# Patient Record
Sex: Male | Born: 1935 | Race: White | Hispanic: No | Marital: Married | Smoking: Former smoker
Health system: Southern US, Community
[De-identification: ages and names within clinical notes are randomized; demographics above are authoritative.]

## PROBLEM LIST (undated history)

## (undated) DIAGNOSIS — K5792 Diverticulitis of intestine, part unspecified, without perforation or abscess without bleeding: Secondary | ICD-10-CM

## (undated) DIAGNOSIS — N390 Urinary tract infection, site not specified: Secondary | ICD-10-CM

## (undated) DIAGNOSIS — E785 Hyperlipidemia, unspecified: Secondary | ICD-10-CM

## (undated) DIAGNOSIS — I10 Essential (primary) hypertension: Secondary | ICD-10-CM

## (undated) DIAGNOSIS — C3490 Malignant neoplasm of unspecified part of unspecified bronchus or lung: Secondary | ICD-10-CM

## (undated) DIAGNOSIS — F0390 Unspecified dementia without behavioral disturbance: Secondary | ICD-10-CM

## (undated) DIAGNOSIS — F329 Major depressive disorder, single episode, unspecified: Secondary | ICD-10-CM

## (undated) DIAGNOSIS — I639 Cerebral infarction, unspecified: Secondary | ICD-10-CM

## (undated) DIAGNOSIS — I4891 Unspecified atrial fibrillation: Secondary | ICD-10-CM

## (undated) DIAGNOSIS — C679 Malignant neoplasm of bladder, unspecified: Secondary | ICD-10-CM

## (undated) HISTORY — PX: CATARACT EXTRACTION, BILATERAL: SHX1313

## (undated) HISTORY — DX: Diverticulitis of intestine, part unspecified, without perforation or abscess without bleeding: K57.92

## (undated) HISTORY — DX: Unspecified atrial fibrillation: I48.91

## (undated) HISTORY — DX: Malignant neoplasm of unspecified part of unspecified bronchus or lung: C34.90

## (undated) HISTORY — DX: Hyperlipidemia, unspecified: E78.5

## (undated) HISTORY — DX: Malignant neoplasm of bladder, unspecified: C67.9

## (undated) HISTORY — PX: LUNG REMOVAL, PARTIAL: SHX233

---

## 1997-08-22 ENCOUNTER — Encounter: Admission: RE | Admit: 1997-08-22 | Discharge: 1997-08-22 | Payer: Self-pay | Admitting: Family Medicine

## 1997-12-18 ENCOUNTER — Encounter: Admission: RE | Admit: 1997-12-18 | Discharge: 1997-12-18 | Payer: Self-pay | Admitting: Family Medicine

## 1998-07-07 ENCOUNTER — Encounter: Admission: RE | Admit: 1998-07-07 | Discharge: 1998-07-07 | Payer: Self-pay | Admitting: Family Medicine

## 1998-08-18 ENCOUNTER — Encounter: Admission: RE | Admit: 1998-08-18 | Discharge: 1998-08-18 | Payer: Self-pay | Admitting: Family Medicine

## 1999-05-27 ENCOUNTER — Encounter: Admission: RE | Admit: 1999-05-27 | Discharge: 1999-05-27 | Payer: Self-pay | Admitting: Family Medicine

## 1999-08-20 ENCOUNTER — Encounter: Admission: RE | Admit: 1999-08-20 | Discharge: 1999-08-20 | Payer: Self-pay | Admitting: Family Medicine

## 2000-01-12 ENCOUNTER — Encounter: Admission: RE | Admit: 2000-01-12 | Discharge: 2000-01-12 | Payer: Self-pay | Admitting: Family Medicine

## 2000-02-03 ENCOUNTER — Encounter: Admission: RE | Admit: 2000-02-03 | Discharge: 2000-02-03 | Payer: Self-pay | Admitting: Family Medicine

## 2000-02-03 ENCOUNTER — Ambulatory Visit (HOSPITAL_COMMUNITY): Admission: RE | Admit: 2000-02-03 | Discharge: 2000-02-03 | Payer: Self-pay | Admitting: Family Medicine

## 2000-02-22 HISTORY — PX: CYSTECTOMY: SUR359

## 2000-03-08 ENCOUNTER — Ambulatory Visit (HOSPITAL_COMMUNITY): Admission: RE | Admit: 2000-03-08 | Discharge: 2000-03-08 | Payer: Self-pay | Admitting: *Deleted

## 2000-03-21 ENCOUNTER — Encounter: Admission: RE | Admit: 2000-03-21 | Discharge: 2000-03-21 | Payer: Self-pay | Admitting: Family Medicine

## 2000-03-24 ENCOUNTER — Encounter: Admission: RE | Admit: 2000-03-24 | Discharge: 2000-03-24 | Payer: Self-pay | Admitting: Family Medicine

## 2000-09-21 ENCOUNTER — Encounter: Admission: RE | Admit: 2000-09-21 | Discharge: 2000-09-21 | Payer: Self-pay | Admitting: *Deleted

## 2000-09-21 ENCOUNTER — Encounter: Payer: Self-pay | Admitting: *Deleted

## 2000-10-20 ENCOUNTER — Observation Stay (HOSPITAL_COMMUNITY): Admission: RE | Admit: 2000-10-20 | Discharge: 2000-10-21 | Payer: Self-pay | Admitting: Urology

## 2000-10-20 ENCOUNTER — Encounter (INDEPENDENT_AMBULATORY_CARE_PROVIDER_SITE_OTHER): Payer: Self-pay | Admitting: Specialist

## 2000-10-24 ENCOUNTER — Ambulatory Visit (HOSPITAL_COMMUNITY): Admission: RE | Admit: 2000-10-24 | Discharge: 2000-10-24 | Payer: Self-pay | Admitting: Urology

## 2000-10-24 ENCOUNTER — Encounter: Payer: Self-pay | Admitting: Urology

## 2000-11-03 ENCOUNTER — Ambulatory Visit (HOSPITAL_COMMUNITY): Admission: RE | Admit: 2000-11-03 | Discharge: 2000-11-03 | Payer: Self-pay | Admitting: Oncology

## 2000-11-03 ENCOUNTER — Encounter: Payer: Self-pay | Admitting: Oncology

## 2000-11-20 ENCOUNTER — Inpatient Hospital Stay (HOSPITAL_COMMUNITY): Admission: RE | Admit: 2000-11-20 | Discharge: 2000-11-27 | Payer: Self-pay | Admitting: Urology

## 2000-11-20 ENCOUNTER — Encounter (INDEPENDENT_AMBULATORY_CARE_PROVIDER_SITE_OTHER): Payer: Self-pay

## 2001-01-26 ENCOUNTER — Inpatient Hospital Stay (HOSPITAL_COMMUNITY): Admission: EM | Admit: 2001-01-26 | Discharge: 2001-01-29 | Payer: Self-pay | Admitting: *Deleted

## 2001-01-28 ENCOUNTER — Encounter: Payer: Self-pay | Admitting: Oncology

## 2001-03-16 ENCOUNTER — Ambulatory Visit: Admission: RE | Admit: 2001-03-16 | Discharge: 2001-06-14 | Payer: Self-pay | Admitting: *Deleted

## 2001-03-26 ENCOUNTER — Inpatient Hospital Stay (HOSPITAL_COMMUNITY): Admission: AD | Admit: 2001-03-26 | Discharge: 2001-03-29 | Payer: Self-pay | Admitting: Urology

## 2001-03-27 ENCOUNTER — Encounter: Payer: Self-pay | Admitting: Urology

## 2001-03-29 ENCOUNTER — Encounter: Payer: Self-pay | Admitting: Urology

## 2001-04-07 ENCOUNTER — Ambulatory Visit (HOSPITAL_COMMUNITY): Admission: RE | Admit: 2001-04-07 | Discharge: 2001-04-07 | Payer: Self-pay | Admitting: Urology

## 2001-04-07 ENCOUNTER — Encounter: Payer: Self-pay | Admitting: Urology

## 2001-04-10 ENCOUNTER — Encounter: Payer: Self-pay | Admitting: Urology

## 2001-04-10 ENCOUNTER — Ambulatory Visit (HOSPITAL_COMMUNITY): Admission: RE | Admit: 2001-04-10 | Discharge: 2001-04-10 | Payer: Self-pay | Admitting: Urology

## 2001-06-11 ENCOUNTER — Ambulatory Visit (HOSPITAL_COMMUNITY): Admission: RE | Admit: 2001-06-11 | Discharge: 2001-06-11 | Payer: Self-pay | Admitting: Oncology

## 2001-06-11 ENCOUNTER — Encounter: Payer: Self-pay | Admitting: Oncology

## 2001-06-12 ENCOUNTER — Ambulatory Visit (HOSPITAL_COMMUNITY): Admission: RE | Admit: 2001-06-12 | Discharge: 2001-06-12 | Payer: Self-pay | Admitting: Oncology

## 2001-06-12 ENCOUNTER — Encounter: Payer: Self-pay | Admitting: Oncology

## 2001-07-31 ENCOUNTER — Encounter: Payer: Self-pay | Admitting: Emergency Medicine

## 2001-07-31 ENCOUNTER — Emergency Department (HOSPITAL_COMMUNITY): Admission: EM | Admit: 2001-07-31 | Discharge: 2001-07-31 | Payer: Self-pay | Admitting: Emergency Medicine

## 2001-08-09 ENCOUNTER — Encounter: Payer: Self-pay | Admitting: Oncology

## 2001-08-09 ENCOUNTER — Ambulatory Visit (HOSPITAL_COMMUNITY): Admission: RE | Admit: 2001-08-09 | Discharge: 2001-08-09 | Payer: Self-pay | Admitting: Oncology

## 2001-09-27 ENCOUNTER — Ambulatory Visit (HOSPITAL_BASED_OUTPATIENT_CLINIC_OR_DEPARTMENT_OTHER): Admission: RE | Admit: 2001-09-27 | Discharge: 2001-09-27 | Payer: Self-pay | Admitting: Urology

## 2001-11-08 ENCOUNTER — Ambulatory Visit (HOSPITAL_COMMUNITY): Admission: RE | Admit: 2001-11-08 | Discharge: 2001-11-08 | Payer: Self-pay | Admitting: Oncology

## 2001-11-08 ENCOUNTER — Encounter: Payer: Self-pay | Admitting: Oncology

## 2002-02-05 ENCOUNTER — Encounter: Payer: Self-pay | Admitting: Oncology

## 2002-02-05 ENCOUNTER — Ambulatory Visit (HOSPITAL_COMMUNITY): Admission: RE | Admit: 2002-02-05 | Discharge: 2002-02-05 | Payer: Self-pay | Admitting: Oncology

## 2002-05-06 ENCOUNTER — Ambulatory Visit (HOSPITAL_COMMUNITY): Admission: RE | Admit: 2002-05-06 | Discharge: 2002-05-06 | Payer: Self-pay | Admitting: Oncology

## 2002-05-06 ENCOUNTER — Encounter: Payer: Self-pay | Admitting: Oncology

## 2002-08-07 ENCOUNTER — Ambulatory Visit (HOSPITAL_COMMUNITY): Admission: RE | Admit: 2002-08-07 | Discharge: 2002-08-07 | Payer: Self-pay | Admitting: Oncology

## 2002-08-07 ENCOUNTER — Encounter: Payer: Self-pay | Admitting: Oncology

## 2002-12-11 ENCOUNTER — Encounter: Payer: Self-pay | Admitting: Oncology

## 2002-12-11 ENCOUNTER — Ambulatory Visit (HOSPITAL_COMMUNITY): Admission: RE | Admit: 2002-12-11 | Discharge: 2002-12-11 | Payer: Self-pay | Admitting: Oncology

## 2003-01-10 ENCOUNTER — Emergency Department (HOSPITAL_COMMUNITY): Admission: AD | Admit: 2003-01-10 | Discharge: 2003-01-10 | Payer: Self-pay | Admitting: Family Medicine

## 2003-01-15 ENCOUNTER — Emergency Department (HOSPITAL_COMMUNITY): Admission: AD | Admit: 2003-01-15 | Discharge: 2003-01-15 | Payer: Self-pay | Admitting: Family Medicine

## 2003-01-27 ENCOUNTER — Encounter: Admission: RE | Admit: 2003-01-27 | Discharge: 2003-01-27 | Payer: Self-pay | Admitting: Surgery

## 2003-01-30 ENCOUNTER — Ambulatory Visit (HOSPITAL_COMMUNITY): Admission: RE | Admit: 2003-01-30 | Discharge: 2003-01-30 | Payer: Self-pay | Admitting: Surgery

## 2003-01-30 ENCOUNTER — Ambulatory Visit (HOSPITAL_BASED_OUTPATIENT_CLINIC_OR_DEPARTMENT_OTHER): Admission: RE | Admit: 2003-01-30 | Discharge: 2003-01-30 | Payer: Self-pay | Admitting: Surgery

## 2003-04-07 ENCOUNTER — Ambulatory Visit (HOSPITAL_COMMUNITY): Admission: RE | Admit: 2003-04-07 | Discharge: 2003-04-07 | Payer: Self-pay | Admitting: Oncology

## 2003-04-11 ENCOUNTER — Ambulatory Visit (HOSPITAL_COMMUNITY): Admission: RE | Admit: 2003-04-11 | Discharge: 2003-04-11 | Payer: Self-pay | Admitting: Oncology

## 2003-06-16 ENCOUNTER — Emergency Department (HOSPITAL_COMMUNITY): Admission: EM | Admit: 2003-06-16 | Discharge: 2003-06-16 | Payer: Self-pay | Admitting: Emergency Medicine

## 2003-08-19 ENCOUNTER — Inpatient Hospital Stay (HOSPITAL_COMMUNITY): Admission: EM | Admit: 2003-08-19 | Discharge: 2003-08-21 | Payer: Self-pay | Admitting: Emergency Medicine

## 2003-10-10 ENCOUNTER — Ambulatory Visit (HOSPITAL_COMMUNITY): Admission: RE | Admit: 2003-10-10 | Discharge: 2003-10-10 | Payer: Self-pay | Admitting: Oncology

## 2004-01-14 ENCOUNTER — Ambulatory Visit: Payer: Self-pay | Admitting: Cardiology

## 2004-02-04 ENCOUNTER — Ambulatory Visit: Payer: Self-pay | Admitting: Cardiology

## 2004-02-22 HISTORY — PX: HERNIA REPAIR: SHX51

## 2004-03-02 ENCOUNTER — Ambulatory Visit: Payer: Self-pay | Admitting: Family Medicine

## 2004-03-03 ENCOUNTER — Ambulatory Visit: Payer: Self-pay | Admitting: Internal Medicine

## 2004-04-01 ENCOUNTER — Ambulatory Visit: Payer: Self-pay | Admitting: Cardiology

## 2004-04-07 ENCOUNTER — Ambulatory Visit: Payer: Self-pay | Admitting: Oncology

## 2004-04-08 ENCOUNTER — Ambulatory Visit (HOSPITAL_COMMUNITY): Admission: RE | Admit: 2004-04-08 | Discharge: 2004-04-08 | Payer: Self-pay | Admitting: Oncology

## 2004-04-14 ENCOUNTER — Ambulatory Visit (HOSPITAL_COMMUNITY): Admission: RE | Admit: 2004-04-14 | Discharge: 2004-04-14 | Payer: Self-pay | Admitting: Oncology

## 2004-04-29 ENCOUNTER — Ambulatory Visit: Payer: Self-pay | Admitting: Internal Medicine

## 2004-06-01 ENCOUNTER — Ambulatory Visit: Payer: Self-pay | Admitting: Cardiology

## 2004-06-29 ENCOUNTER — Ambulatory Visit: Payer: Self-pay | Admitting: Cardiology

## 2004-07-27 ENCOUNTER — Ambulatory Visit: Payer: Self-pay | Admitting: Cardiology

## 2004-08-23 ENCOUNTER — Ambulatory Visit: Payer: Self-pay | Admitting: Cardiology

## 2004-09-20 ENCOUNTER — Ambulatory Visit: Payer: Self-pay | Admitting: Cardiology

## 2004-10-01 ENCOUNTER — Ambulatory Visit: Payer: Self-pay | Admitting: Oncology

## 2004-10-07 ENCOUNTER — Ambulatory Visit (HOSPITAL_COMMUNITY): Admission: RE | Admit: 2004-10-07 | Discharge: 2004-10-07 | Payer: Self-pay | Admitting: Oncology

## 2004-10-18 ENCOUNTER — Ambulatory Visit: Payer: Self-pay | Admitting: Cardiology

## 2004-11-15 ENCOUNTER — Ambulatory Visit: Payer: Self-pay | Admitting: Cardiology

## 2004-12-10 ENCOUNTER — Ambulatory Visit: Payer: Self-pay | Admitting: Cardiovascular Disease

## 2005-01-03 ENCOUNTER — Ambulatory Visit: Payer: Self-pay | Admitting: Cardiology

## 2005-01-10 ENCOUNTER — Ambulatory Visit (HOSPITAL_COMMUNITY): Admission: RE | Admit: 2005-01-10 | Discharge: 2005-01-10 | Payer: Self-pay | Admitting: Gastroenterology

## 2005-01-31 ENCOUNTER — Ambulatory Visit: Payer: Self-pay | Admitting: Cardiology

## 2005-02-02 ENCOUNTER — Ambulatory Visit (HOSPITAL_COMMUNITY): Admission: RE | Admit: 2005-02-02 | Discharge: 2005-02-02 | Payer: Self-pay | Admitting: Gastroenterology

## 2005-02-11 ENCOUNTER — Ambulatory Visit: Payer: Self-pay | Admitting: Cardiology

## 2005-03-04 ENCOUNTER — Ambulatory Visit: Payer: Self-pay | Admitting: Cardiology

## 2005-03-04 ENCOUNTER — Ambulatory Visit: Payer: Self-pay | Admitting: Internal Medicine

## 2005-03-24 ENCOUNTER — Encounter: Admission: RE | Admit: 2005-03-24 | Discharge: 2005-03-24 | Payer: Self-pay | Admitting: Surgery

## 2005-03-25 ENCOUNTER — Ambulatory Visit (HOSPITAL_BASED_OUTPATIENT_CLINIC_OR_DEPARTMENT_OTHER): Admission: RE | Admit: 2005-03-25 | Discharge: 2005-03-25 | Payer: Self-pay | Admitting: Surgery

## 2005-04-01 ENCOUNTER — Ambulatory Visit: Payer: Self-pay | Admitting: Oncology

## 2005-04-01 ENCOUNTER — Ambulatory Visit: Payer: Self-pay | Admitting: Internal Medicine

## 2005-04-06 ENCOUNTER — Ambulatory Visit (HOSPITAL_COMMUNITY): Admission: RE | Admit: 2005-04-06 | Discharge: 2005-04-06 | Payer: Self-pay | Admitting: Oncology

## 2005-04-12 ENCOUNTER — Observation Stay (HOSPITAL_COMMUNITY): Admission: EM | Admit: 2005-04-12 | Discharge: 2005-04-14 | Payer: Self-pay | Admitting: Emergency Medicine

## 2005-05-23 ENCOUNTER — Ambulatory Visit: Payer: Self-pay | Admitting: Cardiology

## 2005-06-20 ENCOUNTER — Ambulatory Visit: Payer: Self-pay | Admitting: Cardiology

## 2005-07-25 ENCOUNTER — Ambulatory Visit: Payer: Self-pay | Admitting: Internal Medicine

## 2005-08-08 ENCOUNTER — Ambulatory Visit: Payer: Self-pay | Admitting: Cardiology

## 2005-09-05 ENCOUNTER — Ambulatory Visit: Payer: Self-pay | Admitting: Cardiology

## 2005-09-29 ENCOUNTER — Ambulatory Visit: Payer: Self-pay | Admitting: Oncology

## 2005-10-03 ENCOUNTER — Ambulatory Visit: Payer: Self-pay | Admitting: Cardiology

## 2005-10-03 LAB — CBC WITH DIFFERENTIAL/PLATELET
BASO%: 0.3 % (ref 0.0–2.0)
Basophils Absolute: 0 10*3/uL (ref 0.0–0.1)
EOS%: 1.1 % (ref 0.0–7.0)
HCT: 45.8 % (ref 38.7–49.9)
HGB: 15.6 g/dL (ref 13.0–17.1)
LYMPH%: 19.7 % (ref 14.0–48.0)
MCH: 30.2 pg (ref 28.0–33.4)
MCHC: 34.1 g/dL (ref 32.0–35.9)
MCV: 88.6 fL (ref 81.6–98.0)
MONO%: 11.4 % (ref 0.0–13.0)
NEUT%: 67.5 % (ref 40.0–75.0)
Platelets: 236 10*3/uL (ref 145–400)
WBC: 7.1 10*3/uL (ref 4.0–10.0)

## 2005-10-03 LAB — COMPREHENSIVE METABOLIC PANEL
ALT: 18 U/L (ref 0–40)
AST: 19 U/L (ref 0–37)
Alkaline Phosphatase: 73 U/L (ref 39–117)
BUN: 15 mg/dL (ref 6–23)
Calcium: 9.2 mg/dL (ref 8.4–10.5)
Chloride: 108 mEq/L (ref 96–112)
Creatinine, Ser: 1.07 mg/dL (ref 0.40–1.50)
Total Bilirubin: 0.6 mg/dL (ref 0.3–1.2)

## 2005-10-06 ENCOUNTER — Ambulatory Visit (HOSPITAL_COMMUNITY): Admission: RE | Admit: 2005-10-06 | Discharge: 2005-10-06 | Payer: Self-pay | Admitting: Oncology

## 2005-10-17 ENCOUNTER — Ambulatory Visit: Payer: Self-pay | Admitting: Cardiology

## 2005-11-14 ENCOUNTER — Ambulatory Visit: Payer: Self-pay | Admitting: Cardiology

## 2005-11-17 ENCOUNTER — Ambulatory Visit: Payer: Self-pay | Admitting: Oncology

## 2005-12-12 ENCOUNTER — Ambulatory Visit: Payer: Self-pay | Admitting: Cardiology

## 2006-01-09 ENCOUNTER — Ambulatory Visit: Payer: Self-pay | Admitting: Internal Medicine

## 2006-02-06 ENCOUNTER — Ambulatory Visit: Payer: Self-pay | Admitting: Cardiology

## 2006-03-01 ENCOUNTER — Ambulatory Visit (HOSPITAL_COMMUNITY): Admission: RE | Admit: 2006-03-01 | Discharge: 2006-03-01 | Payer: Self-pay | Admitting: Internal Medicine

## 2006-03-02 ENCOUNTER — Encounter: Admission: RE | Admit: 2006-03-02 | Discharge: 2006-05-31 | Payer: Self-pay | Admitting: Internal Medicine

## 2006-03-06 ENCOUNTER — Ambulatory Visit: Payer: Self-pay | Admitting: Internal Medicine

## 2006-03-27 ENCOUNTER — Ambulatory Visit: Payer: Self-pay | Admitting: Cardiology

## 2006-04-27 ENCOUNTER — Ambulatory Visit: Payer: Self-pay | Admitting: Cardiology

## 2006-05-10 ENCOUNTER — Ambulatory Visit: Payer: Self-pay | Admitting: Oncology

## 2006-05-15 LAB — CBC WITH DIFFERENTIAL/PLATELET
BASO%: 0.4 % (ref 0.0–2.0)
Basophils Absolute: 0 10*3/uL (ref 0.0–0.1)
Eosinophils Absolute: 0.1 10*3/uL (ref 0.0–0.5)
HCT: 44.4 % (ref 38.7–49.9)
LYMPH%: 20.1 % (ref 14.0–48.0)
MCHC: 34.3 g/dL (ref 32.0–35.9)
MONO#: 0.8 10*3/uL (ref 0.1–0.9)
NEUT%: 66.3 % (ref 40.0–75.0)
Platelets: 217 10*3/uL (ref 145–400)
WBC: 6.4 10*3/uL (ref 4.0–10.0)

## 2006-05-15 LAB — COMPREHENSIVE METABOLIC PANEL
BUN: 27 mg/dL — ABNORMAL HIGH (ref 6–23)
CO2: 26 mEq/L (ref 19–32)
Creatinine, Ser: 1.12 mg/dL (ref 0.40–1.50)
Glucose, Bld: 90 mg/dL (ref 70–99)
Total Bilirubin: 0.6 mg/dL (ref 0.3–1.2)

## 2006-05-15 LAB — LACTATE DEHYDROGENASE: LDH: 171 U/L (ref 94–250)

## 2006-05-16 ENCOUNTER — Ambulatory Visit (HOSPITAL_COMMUNITY): Admission: RE | Admit: 2006-05-16 | Discharge: 2006-05-16 | Payer: Self-pay | Admitting: Oncology

## 2006-05-25 ENCOUNTER — Ambulatory Visit: Payer: Self-pay | Admitting: Cardiology

## 2006-06-08 ENCOUNTER — Ambulatory Visit: Payer: Self-pay | Admitting: Internal Medicine

## 2006-06-22 ENCOUNTER — Ambulatory Visit: Payer: Self-pay | Admitting: Internal Medicine

## 2006-07-03 ENCOUNTER — Ambulatory Visit: Payer: Self-pay | Admitting: Cardiology

## 2006-07-14 ENCOUNTER — Ambulatory Visit: Payer: Self-pay | Admitting: Cardiovascular Disease

## 2006-07-25 ENCOUNTER — Encounter: Payer: Self-pay | Admitting: Pulmonary Disease

## 2006-07-25 ENCOUNTER — Ambulatory Visit (HOSPITAL_COMMUNITY): Admission: RE | Admit: 2006-07-25 | Discharge: 2006-07-25 | Payer: Self-pay | Admitting: Internal Medicine

## 2006-08-04 ENCOUNTER — Ambulatory Visit: Payer: Self-pay | Admitting: Cardiology

## 2006-08-31 ENCOUNTER — Ambulatory Visit: Payer: Self-pay | Admitting: Internal Medicine

## 2006-09-28 ENCOUNTER — Ambulatory Visit: Payer: Self-pay | Admitting: Internal Medicine

## 2006-09-28 LAB — CONVERTED CEMR LAB: Prothrombin Time: 31.8 s (ref 10.0–14.0)

## 2006-10-09 ENCOUNTER — Ambulatory Visit: Payer: Self-pay | Admitting: Cardiology

## 2006-10-27 ENCOUNTER — Inpatient Hospital Stay (HOSPITAL_COMMUNITY): Admission: AD | Admit: 2006-10-27 | Discharge: 2006-10-30 | Payer: Self-pay | Admitting: Internal Medicine

## 2006-10-27 ENCOUNTER — Encounter: Admission: RE | Admit: 2006-10-27 | Discharge: 2006-10-27 | Payer: Self-pay | Admitting: Internal Medicine

## 2006-12-11 ENCOUNTER — Ambulatory Visit: Payer: Self-pay | Admitting: Cardiology

## 2006-12-18 ENCOUNTER — Ambulatory Visit: Payer: Self-pay | Admitting: Cardiology

## 2007-01-01 ENCOUNTER — Ambulatory Visit: Payer: Self-pay | Admitting: Cardiology

## 2007-01-15 ENCOUNTER — Inpatient Hospital Stay (HOSPITAL_COMMUNITY): Admission: EM | Admit: 2007-01-15 | Discharge: 2007-01-17 | Payer: Self-pay | Admitting: Emergency Medicine

## 2007-02-05 ENCOUNTER — Encounter: Admission: RE | Admit: 2007-02-05 | Discharge: 2007-02-05 | Payer: Self-pay | Admitting: Surgery

## 2007-03-01 ENCOUNTER — Ambulatory Visit: Payer: Self-pay | Admitting: Internal Medicine

## 2007-03-29 ENCOUNTER — Ambulatory Visit: Payer: Self-pay | Admitting: Cardiology

## 2007-04-26 ENCOUNTER — Ambulatory Visit: Payer: Self-pay | Admitting: Cardiovascular Disease

## 2007-05-10 ENCOUNTER — Ambulatory Visit: Payer: Self-pay | Admitting: Oncology

## 2007-05-15 ENCOUNTER — Ambulatory Visit (HOSPITAL_COMMUNITY): Admission: RE | Admit: 2007-05-15 | Discharge: 2007-05-15 | Payer: Self-pay | Admitting: Oncology

## 2007-05-17 ENCOUNTER — Ambulatory Visit: Payer: Self-pay | Admitting: Cardiology

## 2007-06-18 ENCOUNTER — Ambulatory Visit: Payer: Self-pay | Admitting: Cardiovascular Disease

## 2007-07-17 ENCOUNTER — Ambulatory Visit: Payer: Self-pay | Admitting: Cardiology

## 2007-08-07 ENCOUNTER — Ambulatory Visit: Payer: Self-pay | Admitting: Cardiology

## 2007-09-04 ENCOUNTER — Ambulatory Visit: Payer: Self-pay | Admitting: Cardiology

## 2007-09-25 ENCOUNTER — Ambulatory Visit: Payer: Self-pay | Admitting: Cardiology

## 2007-10-18 ENCOUNTER — Ambulatory Visit: Payer: Self-pay | Admitting: Cardiovascular Disease

## 2007-11-01 ENCOUNTER — Ambulatory Visit: Payer: Self-pay | Admitting: Cardiology

## 2007-11-19 ENCOUNTER — Ambulatory Visit: Payer: Self-pay | Admitting: Internal Medicine

## 2007-11-26 ENCOUNTER — Ambulatory Visit: Payer: Self-pay | Admitting: Internal Medicine

## 2007-12-10 ENCOUNTER — Ambulatory Visit: Payer: Self-pay | Admitting: Internal Medicine

## 2007-12-25 ENCOUNTER — Ambulatory Visit: Payer: Self-pay | Admitting: Internal Medicine

## 2008-01-08 ENCOUNTER — Ambulatory Visit: Payer: Self-pay | Admitting: Cardiology

## 2008-01-22 ENCOUNTER — Ambulatory Visit: Payer: Self-pay | Admitting: Cardiovascular Disease

## 2008-02-13 DIAGNOSIS — I10 Essential (primary) hypertension: Secondary | ICD-10-CM

## 2008-02-19 ENCOUNTER — Ambulatory Visit: Payer: Self-pay | Admitting: Cardiovascular Disease

## 2008-03-13 ENCOUNTER — Ambulatory Visit: Payer: Self-pay | Admitting: Pulmonary Disease

## 2008-03-13 DIAGNOSIS — C679 Malignant neoplasm of bladder, unspecified: Secondary | ICD-10-CM

## 2008-03-13 DIAGNOSIS — R062 Wheezing: Secondary | ICD-10-CM | POA: Insufficient documentation

## 2008-03-13 DIAGNOSIS — E785 Hyperlipidemia, unspecified: Secondary | ICD-10-CM

## 2008-03-21 ENCOUNTER — Ambulatory Visit: Payer: Self-pay | Admitting: Cardiovascular Disease

## 2008-03-27 ENCOUNTER — Telehealth (INDEPENDENT_AMBULATORY_CARE_PROVIDER_SITE_OTHER): Payer: Self-pay | Admitting: *Deleted

## 2008-04-01 ENCOUNTER — Telehealth (INDEPENDENT_AMBULATORY_CARE_PROVIDER_SITE_OTHER): Payer: Self-pay | Admitting: *Deleted

## 2008-04-17 ENCOUNTER — Ambulatory Visit: Payer: Self-pay | Admitting: Internal Medicine

## 2008-04-24 ENCOUNTER — Ambulatory Visit: Payer: Self-pay | Admitting: Cardiovascular Disease

## 2008-05-08 ENCOUNTER — Ambulatory Visit: Payer: Self-pay | Admitting: Oncology

## 2008-05-12 LAB — CBC WITH DIFFERENTIAL/PLATELET
Basophils Absolute: 0.1 10*3/uL (ref 0.0–0.1)
EOS%: 2.4 % (ref 0.0–7.0)
HGB: 15.8 g/dL (ref 13.0–17.1)
MCH: 29.6 pg (ref 27.2–33.4)
NEUT#: 5.2 10*3/uL (ref 1.5–6.5)
RDW: 14.9 % — ABNORMAL HIGH (ref 11.0–14.6)
WBC: 7.7 10*3/uL (ref 4.0–10.3)
lymph#: 1.6 10*3/uL (ref 0.9–3.3)

## 2008-05-12 LAB — COMPREHENSIVE METABOLIC PANEL
Albumin: 4.2 g/dL (ref 3.5–5.2)
Alkaline Phosphatase: 62 U/L (ref 39–117)
BUN: 20 mg/dL (ref 6–23)
Calcium: 9.5 mg/dL (ref 8.4–10.5)
Glucose, Bld: 134 mg/dL — ABNORMAL HIGH (ref 70–99)
Potassium: 4.3 mEq/L (ref 3.5–5.3)

## 2008-05-15 ENCOUNTER — Ambulatory Visit (HOSPITAL_COMMUNITY): Admission: RE | Admit: 2008-05-15 | Discharge: 2008-05-15 | Payer: Self-pay | Admitting: Oncology

## 2008-05-27 ENCOUNTER — Ambulatory Visit (HOSPITAL_COMMUNITY): Admission: RE | Admit: 2008-05-27 | Discharge: 2008-05-27 | Payer: Self-pay | Admitting: Oncology

## 2008-05-29 ENCOUNTER — Ambulatory Visit (HOSPITAL_COMMUNITY): Admission: RE | Admit: 2008-05-29 | Discharge: 2008-05-29 | Payer: Self-pay | Admitting: Oncology

## 2008-06-11 ENCOUNTER — Ambulatory Visit: Payer: Self-pay | Admitting: Internal Medicine

## 2008-06-25 ENCOUNTER — Ambulatory Visit: Payer: Self-pay | Admitting: Cardiovascular Disease

## 2008-07-02 ENCOUNTER — Ambulatory Visit: Payer: Self-pay | Admitting: Oncology

## 2008-07-16 ENCOUNTER — Ambulatory Visit: Payer: Self-pay | Admitting: Internal Medicine

## 2008-07-22 ENCOUNTER — Encounter: Payer: Self-pay | Admitting: *Deleted

## 2008-08-06 ENCOUNTER — Ambulatory Visit: Payer: Self-pay | Admitting: Cardiology

## 2008-08-06 LAB — CONVERTED CEMR LAB
POC INR: 1.8
Protime: 16.5

## 2008-08-20 ENCOUNTER — Ambulatory Visit: Payer: Self-pay | Admitting: Internal Medicine

## 2008-08-20 ENCOUNTER — Encounter (INDEPENDENT_AMBULATORY_CARE_PROVIDER_SITE_OTHER): Payer: Self-pay | Admitting: Pharmacist

## 2008-08-20 LAB — CONVERTED CEMR LAB
POC INR: 1.6
Prothrombin Time: 15.4 s

## 2008-08-27 ENCOUNTER — Encounter: Payer: Self-pay | Admitting: *Deleted

## 2008-09-03 ENCOUNTER — Ambulatory Visit: Payer: Self-pay | Admitting: Internal Medicine

## 2008-09-24 ENCOUNTER — Ambulatory Visit (HOSPITAL_COMMUNITY): Admission: RE | Admit: 2008-09-24 | Discharge: 2008-09-24 | Payer: Self-pay | Admitting: Oncology

## 2008-10-01 ENCOUNTER — Ambulatory Visit: Payer: Self-pay | Admitting: Internal Medicine

## 2008-10-23 ENCOUNTER — Ambulatory Visit: Payer: Self-pay | Admitting: Oncology

## 2008-10-28 LAB — COMPREHENSIVE METABOLIC PANEL
ALT: 15 U/L (ref 0–53)
BUN: 18 mg/dL (ref 6–23)
CO2: 19 mEq/L (ref 19–32)
Calcium: 9.2 mg/dL (ref 8.4–10.5)
Chloride: 107 mEq/L (ref 96–112)
Creatinine, Ser: 1.15 mg/dL (ref 0.40–1.50)
Glucose, Bld: 110 mg/dL — ABNORMAL HIGH (ref 70–99)

## 2008-10-28 LAB — CBC WITH DIFFERENTIAL/PLATELET
Basophils Absolute: 0 10*3/uL (ref 0.0–0.1)
Eosinophils Absolute: 0.1 10*3/uL (ref 0.0–0.5)
HCT: 49.7 % (ref 38.4–49.9)
HGB: 16.8 g/dL (ref 13.0–17.1)
MONO#: 0.7 10*3/uL (ref 0.1–0.9)
NEUT%: 66.9 % (ref 39.0–75.0)
Platelets: 193 10*3/uL (ref 140–400)
WBC: 7.6 10*3/uL (ref 4.0–10.3)
lymph#: 1.7 10*3/uL (ref 0.9–3.3)

## 2008-10-28 LAB — LACTATE DEHYDROGENASE: LDH: 185 U/L (ref 94–250)

## 2008-10-29 ENCOUNTER — Ambulatory Visit: Payer: Self-pay | Admitting: Cardiology

## 2008-11-26 ENCOUNTER — Ambulatory Visit: Payer: Self-pay | Admitting: Internal Medicine

## 2008-12-24 ENCOUNTER — Inpatient Hospital Stay (HOSPITAL_COMMUNITY): Admission: AD | Admit: 2008-12-24 | Discharge: 2008-12-26 | Payer: Self-pay | Admitting: Internal Medicine

## 2008-12-24 ENCOUNTER — Ambulatory Visit: Payer: Self-pay | Admitting: Internal Medicine

## 2008-12-24 ENCOUNTER — Telehealth: Payer: Self-pay | Admitting: Cardiology

## 2008-12-25 ENCOUNTER — Ambulatory Visit: Payer: Self-pay | Admitting: Vascular Surgery

## 2008-12-25 ENCOUNTER — Encounter (INDEPENDENT_AMBULATORY_CARE_PROVIDER_SITE_OTHER): Payer: Self-pay | Admitting: Internal Medicine

## 2009-01-19 ENCOUNTER — Encounter: Admission: RE | Admit: 2009-01-19 | Discharge: 2009-02-19 | Payer: Self-pay | Admitting: Internal Medicine

## 2009-01-27 ENCOUNTER — Ambulatory Visit: Payer: Self-pay | Admitting: Cardiovascular Disease

## 2009-02-24 ENCOUNTER — Ambulatory Visit: Payer: Self-pay | Admitting: Cardiology

## 2009-02-24 LAB — CONVERTED CEMR LAB: POC INR: 2.8

## 2009-03-24 ENCOUNTER — Ambulatory Visit: Payer: Self-pay | Admitting: Internal Medicine

## 2009-03-24 LAB — CONVERTED CEMR LAB: POC INR: 2.4

## 2009-04-22 ENCOUNTER — Ambulatory Visit: Payer: Self-pay | Admitting: Cardiology

## 2009-04-24 ENCOUNTER — Ambulatory Visit: Payer: Self-pay | Admitting: Oncology

## 2009-04-28 ENCOUNTER — Ambulatory Visit (HOSPITAL_COMMUNITY): Admission: RE | Admit: 2009-04-28 | Discharge: 2009-04-28 | Payer: Self-pay | Admitting: Oncology

## 2009-04-28 LAB — CBC WITH DIFFERENTIAL/PLATELET
Basophils Absolute: 0 10*3/uL (ref 0.0–0.1)
Eosinophils Absolute: 0.2 10*3/uL (ref 0.0–0.5)
HGB: 17 g/dL (ref 13.0–17.1)
MCV: 92.1 fL (ref 79.3–98.0)
MONO#: 1.2 10*3/uL — ABNORMAL HIGH (ref 0.1–0.9)
MONO%: 11.1 % (ref 0.0–14.0)
NEUT#: 8.1 10*3/uL — ABNORMAL HIGH (ref 1.5–6.5)
RBC: 5.43 10*6/uL (ref 4.20–5.82)
RDW: 14.3 % (ref 11.0–14.6)
WBC: 10.9 10*3/uL — ABNORMAL HIGH (ref 4.0–10.3)
lymph#: 1.4 10*3/uL (ref 0.9–3.3)

## 2009-04-28 LAB — COMPREHENSIVE METABOLIC PANEL
AST: 19 U/L (ref 0–37)
Albumin: 3.6 g/dL (ref 3.5–5.2)
Alkaline Phosphatase: 62 U/L (ref 39–117)
Calcium: 9.5 mg/dL (ref 8.4–10.5)
Chloride: 104 mEq/L (ref 96–112)
Glucose, Bld: 125 mg/dL — ABNORMAL HIGH (ref 70–99)
Potassium: 3.9 mEq/L (ref 3.5–5.3)
Sodium: 141 mEq/L (ref 135–145)
Total Protein: 6.9 g/dL (ref 6.0–8.3)

## 2009-05-11 ENCOUNTER — Telehealth: Payer: Self-pay | Admitting: Cardiology

## 2009-05-13 ENCOUNTER — Ambulatory Visit: Payer: Self-pay | Admitting: Internal Medicine

## 2009-05-13 ENCOUNTER — Ambulatory Visit: Payer: Self-pay | Admitting: Cardiology

## 2009-05-13 LAB — CONVERTED CEMR LAB: POC INR: 2.2

## 2009-05-19 ENCOUNTER — Ambulatory Visit: Payer: Self-pay | Admitting: Internal Medicine

## 2009-05-19 LAB — CONVERTED CEMR LAB: POC INR: 1.4

## 2009-05-21 ENCOUNTER — Ambulatory Visit (HOSPITAL_COMMUNITY): Admission: RE | Admit: 2009-05-21 | Discharge: 2009-05-21 | Payer: Self-pay | Admitting: Oncology

## 2009-05-25 ENCOUNTER — Ambulatory Visit: Payer: Self-pay | Admitting: Cardiovascular Disease

## 2009-05-25 ENCOUNTER — Telehealth (INDEPENDENT_AMBULATORY_CARE_PROVIDER_SITE_OTHER): Payer: Self-pay | Admitting: *Deleted

## 2009-05-25 LAB — CONVERTED CEMR LAB: POC INR: 2.1

## 2009-06-03 ENCOUNTER — Ambulatory Visit: Payer: Self-pay | Admitting: Oncology

## 2009-06-08 ENCOUNTER — Telehealth (INDEPENDENT_AMBULATORY_CARE_PROVIDER_SITE_OTHER): Payer: Self-pay | Admitting: *Deleted

## 2009-06-08 ENCOUNTER — Ambulatory Visit: Payer: Self-pay | Admitting: Cardiology

## 2009-06-08 LAB — CONVERTED CEMR LAB: POC INR: 2.2

## 2009-06-11 LAB — CBC WITH DIFFERENTIAL/PLATELET
Basophils Absolute: 0.1 10*3/uL (ref 0.0–0.1)
EOS%: 2 % (ref 0.0–7.0)
Eosinophils Absolute: 0.2 10*3/uL (ref 0.0–0.5)
HCT: 50.1 % — ABNORMAL HIGH (ref 38.4–49.9)
HGB: 17 g/dL (ref 13.0–17.1)
MCH: 31.4 pg (ref 27.2–33.4)
NEUT#: 7.6 10*3/uL — ABNORMAL HIGH (ref 1.5–6.5)
NEUT%: 74.8 % (ref 39.0–75.0)
RDW: 14 % (ref 11.0–14.6)
lymph#: 1.4 10*3/uL (ref 0.9–3.3)

## 2009-06-11 LAB — COMPREHENSIVE METABOLIC PANEL
Albumin: 4.1 g/dL (ref 3.5–5.2)
BUN: 23 mg/dL (ref 6–23)
CO2: 23 mEq/L (ref 19–32)
Calcium: 9.7 mg/dL (ref 8.4–10.5)
Chloride: 105 mEq/L (ref 96–112)
Creatinine, Ser: 1.28 mg/dL (ref 0.40–1.50)
Glucose, Bld: 151 mg/dL — ABNORMAL HIGH (ref 70–99)
Potassium: 4.2 mEq/L (ref 3.5–5.3)

## 2009-06-11 LAB — LACTATE DEHYDROGENASE: LDH: 189 U/L (ref 94–250)

## 2010-03-14 ENCOUNTER — Encounter: Payer: Self-pay | Admitting: Oncology

## 2010-03-25 NOTE — Assessment & Plan Note (Signed)
Summary: rov. gd  Medications Added DOXYCYCLINE HYCLATE 100 MG SOLR (DOXYCYCLINE HYCLATE) 1 tab once daily x 3 months..switch q 72mo w/cephalexin CEPHALEXIN 500 MG CAPS (CEPHALEXIN) 1 cap once daily x 3 months..switch w/doxycycline q 3 months NEXIUM 40 MG CPDR (ESOMEPRAZOLE MAGNESIUM) 1 tab as needed NIASPAN 500 MG CR-TABS (NIACIN (ANTIHYPERLIPIDEMIC)) 1 tab at bedtime TEMAZEPAM 15 MG CAPS (TEMAZEPAM) 1 cap at bedtime        Visit Type:  rov Referring Provider:  Buren Hall Primary Provider:  Eric Hall  CC:  sob...no cp or edema.  History of Present Illness: [name] comes in today for anticoagulation advice during a percutaneous lung biopsy scheduled for the end of March.  Remarkably, he has a chin hypertension as risk factors for thromboembolic stroke with his chronic atrial fib. He presented December 24, 2008 with progressive gait instability, left facial droop, and slurred speech. Complete evaluation did not show any major infarct. He had an echocardiogram showed EF is 6065% with a severely dilated left atrium. Carotid Dopplers were done which showed no significant stenosis. Transesophageal echo was not done. Of note his was 1.91 on the day of admission. He is very compliant and most of his [name] reviewed have been therapeutic. At discharge his was 2.33.  He is scheduled for a percutaneous biopsy of the left upper lobe nodule which is gotten larger. He is being seen by Dr. Donnie Hall in the biopsy to be done by radiology.  Current Medications (verified): 1)  Toprol Xl 50 Mg Xr24h-Tab (Metoprolol Succinate) .... Take 1 Tablet By Mouth Two Times A Day 2)  Vitamin C 1000 Mg Tabs (Ascorbic Acid) .... Take 1 Tablet By Mouth Once A Day 3)  Calcium 600 600 Mg Tabs (Calcium Carbonate) .... Take 1 Tablet By Mouth Three Times A Day 4)  Vitamin D 1000 Unit Tabs (Cholecalciferol) .... Take 1 Tablet By Mouth Once A Day 5)  Coumadin 2.5 Mg Tabs (Warfarin Sodium) .... Use As Directed Per  Coumadin Clinic 6)  Vitamin B-12 500 Mcg Tabs (Cyanocobalamin) .... Take 1 Daily 7)  Doxycycline Hyclate 100 Mg Solr (Doxycycline Hyclate) .Marland Kitchen.. 1 Tab Once Daily X 3 Months..switch Q 72mo W/cephalexin 8)  Cephalexin 500 Mg Caps (Cephalexin) .Marland Kitchen.. 1 Cap Once Daily X 3 Months..switch W/doxycycline Q 3 Months 9)  Nexium 40 Mg Cpdr (Esomeprazole Magnesium) .Marland Kitchen.. 1 Tab As Needed 10)  Niaspan 500 Mg Cr-Tabs (Niacin (Antihyperlipidemic)) .Marland Kitchen.. 1 Tab At Bedtime 11)  Temazepam 15 Mg Caps (Temazepam) .Marland Kitchen.. 1 Cap At Bedtime  Allergies: 1)  ! Sulfa  Past History:  Past Medical History: Last updated: 05/12/2009 ATRIAL FIBRILLATION PAROXYSMAL (ICD-427.31) COUMADIN THERAPY (ICD-V58.61) HYPERLIPIDEMIA (ICD-272.4) HYPERTENSION, BENIGN (ICD-401.1) BLADDER CANCER (ICD-188.9) WHEEZING (ICD-786.07)  Past Surgical History: Last updated: 03/13/2008 bladder surgery foot surgery  Family History: Last updated: 03/13/2008 heart disease: father (m.i.) cancer: maternal grandmother (unsure what kind)  brother: AAA  Social History: Last updated: 03/13/2008 Patient states former smoker.  pt is married. pt has 3 grown children. pt is retired.  previously worked as a Producer, television/film/video vice principal.    Risk Factors: Smoking Status: quit (03/13/2008)  Review of Systems       negative other than history of present illness  Vital Signs:  Patient profile:   77 year old male Height:      72 inches Weight:      241 pounds BMI:     32.80 Pulse rate:   89 / minute Pulse rhythm:   irregular BP  sitting:   140 / 90  (left arm) Cuff size:   large  Vitals Entered By: [name], CMA (May 13, 2009 9:58 AM)  Physical Exam  General:  Well developed, well nourished, in no acute distress. Head:  normocephalic and atraumatic Eyes:  PERRLA/EOM intact; conjunctiva and lids normal. Neck:  Neck supple, no JVD. No masses, thyromegaly or abnormal cervical nodes. Chest [name]:  no deformities or breast masses  noted Lungs:  Clear bilaterally to auscultation and percussion. Heart:  irregular rate and rhythm, durable S1-S2, no murmur or gallop Msk:  gait instability from his neuropathy Pulses:  pulses normal in all 4 extremities Extremities:  No clubbing or cyanosis. Neurologic:  Alert and oriented x 3. Skin:  Intact without lesions or rashes. Psych:  Normal affect.   EKG  Procedure date:  05/13/2009  Findings:      chronic atrial fibrillation, left axis deviation, no change  Impression & Recommendations:  Problem # 1:  ATRIAL FIBRILLATION PAROXYSMAL (ICD-427.31) Assessment Unchanged  His updated medication list for this problem includes:    Toprol Xl 50 Mg Xr24h-tab (Metoprolol succinate) .Marland Kitchen... Take 1 tablet by mouth two times a day    Coumadin 2.5 Mg Tabs (Warfarin sodium) ..... Use as directed per coumadin clinic  Orders: EKG w/ Interpretation (93000)  Problem # 2:  COUMADIN THERAPY (ICD-V58.61) Assessment: Unchanged I feel he is high risk for having an embolic event off Coumadin for his biopsy. I discuss this with him at length and reviewed all his records in the hospital admission. We'll check a protime today and I'll have our anticoagulation clinic make appropriate recommendations for stopping his Coumadin and using postbiopsy Lovenox. I will send a copy of this note to Dr. Donnie Hall. I'll see him back in 3 months for close followup.  Problem # 3:  HYPERTENSION, BENIGN (ICD-401.1) Assessment: Unchanged  His updated medication list for this problem includes:    Toprol Xl 50 Mg Xr24h-tab (Metoprolol succinate) .Marland Kitchen... Take 1 tablet by mouth two times a day  Patient Instructions: 1)  Your physician recommends that you schedule a follow-up appointment in: MOVING TO [place] 2)  Your physician has recommended you make the following change in your medication: HOLD WARFARIN 3 DAYS PRIOR TO LUNG BIOPSY REQUIRES LOVENOX BRIDGING  FOR PROCEDURE  D/T  TIA IN THE FALL OF 2010 COUMADIN CLINIC TO  MANAGE

## 2010-03-25 NOTE — Progress Notes (Signed)
Summary: coumadin   Phone Note Call from Patient Call back at Home Phone [phone]   Caller: Patient Summary of Call: request to speak to Tiffany in the coumadin department Initial call taken by: Migdalia Dk,  May 25, 2009 3:40 PM  Follow-up for Phone Call        Spoke with pt. and his question was answered.  Follow-up by: Bethena Midget, RN, BSN,  May 26, 2009 3:06 PM

## 2010-03-25 NOTE — Progress Notes (Signed)
   Pt brought ROI in today, Sent to [facility]  June 08, 2009 10:47 AM

## 2010-03-25 NOTE — Progress Notes (Signed)
Summary: hold coumadin    Phone Note From Other Clinic   Caller: Nurse Deanna Artis Summary of Call: [facility] coumadin for 4 days prior to procedure.  fax: [phone] Initial call taken by: Edman Circle,  May 11, 2009 9:54 AM  Follow-up for Phone Call        Spoke with Deanna Artis at Dr. Renelda Loma office she states pt. is having a Biopsy on  March 30th that's why he needs to be off.  Follow-up by: Bethena Midget, RN, BSN,  May 11, 2009 10:06 AM  Additional Follow-up for Phone Call Additional follow up Details #1::        This is complicated. He may of had recent stroke. He needs to office for EKG to see if in NSR and have [place] review records to clear for this. I can add on this week. Additional Follow-up by: Gaylord Shih, MD, [facility],  May 11, 2009 11:23 AM

## 2010-03-25 NOTE — Medication Information (Signed)
Summary: rov/ewj  Anticoagulant Therapy  Managed by: Bethena Midget, RN, BSN Referring MD: Valera Castle MD PCP: Eric Form Supervising MD: Graciela Husbands MD, Viviann Spare Indication 1: Atrial Fibrillation (ICD-427.31) Lab Used: [facility] Site: [facility] INR POC 2.4 INR RANGE 2 - 3  Dietary changes: no    Health status changes: no    Bleeding/hemorrhagic complications: no    Recent/future hospitalizations: no    Any changes in medication regimen? no    Recent/future dental: no  Any missed doses?: no       Is patient compliant with meds? yes       Allergies: 1)  ! Sulfa   Anticoagulation Management History:      The patient is taking warfarin and comes in today for a routine follow up visit.  Positive risk factors for bleeding include an age of 75 years or older.  The bleeding index is 'intermediate risk'.  Positive CHADS2 values include History of HTN and Age > 75 years old.  The start date was 07/08/2003.  His last INR was 6.0 RATIO.  Anticoagulation responsible provider: Graciela Husbands MD, Viviann Spare.  INR POC: 2.4.  Cuvette Lot#: [id number].  Exp: 05/2010.    Anticoagulation Management Assessment/Plan:      The patient's current anticoagulation dose is Coumadin 2.5 mg tabs: use as directed per coumadin clinic.  The target INR is 2 - 3.  The next INR is due 04/21/2009.  Anticoagulation instructions were given to patient.  Results were reviewed/authorized by Bethena Midget, RN, BSN.  He was notified by Bethena Midget, RN, BSN.         Prior Anticoagulation Instructions: INR 2.8  Continue on same dosage 1 tablet daily except 2 tablets on Sundays and Wednesdays.   Recheck in 4 weeks.    Current Anticoagulation Instructions: INR 2.4 Continue 2.5mg s everyday except 5mg s on Sundays and Wednesdays. Recheck in 4 weeks.

## 2010-03-25 NOTE — Medication Information (Signed)
Summary: rov/tm  Anticoagulant Therapy  Managed by: Eda Keys, PharmD Referring MD: Valera Castle MD PCP: Eric Form Supervising MD: Riley Kill MD, Maisie Fus Indication 1: Atrial Fibrillation (ICD-427.31) Lab Used: [facility] Site: [facility] INR POC 4.1 INR RANGE 2 - 3  Dietary changes: no    Health status changes: no    Bleeding/hemorrhagic complications: no    Recent/future hospitalizations: no    Any changes in medication regimen? no    Recent/future dental: no  Any missed doses?: no       Is patient compliant with meds? yes      Comments: Pt ran out of warfarin 2.5 mg tablets and had to purchase a supply from the pharmacy, they filled the rx with 5 mg tablets.  Pt believes he may have forgotten to split these tablets to make the 2.5 mg dose.  He is unsure how many times he could have had extra warfarin.  He has a regular supply of 2.5 mg tablets being shipped from his mail order pharmacy.  Allergies: 1)  ! Sulfa  Anticoagulation Management History:      The patient is taking warfarin and comes in today for a routine follow up visit.  Positive risk factors for bleeding include an age of 34 years or older.  The bleeding index is 'intermediate risk'.  Positive CHADS2 values include History of HTN and Age > 49 years old.  The start date was 07/08/2003.  His last INR was 6.0 RATIO.  Anticoagulation responsible provider: Riley Kill MD, Maisie Fus.  INR POC: 4.1.  Cuvette Lot#: [id number].  Exp: 06/2010.    Anticoagulation Management Assessment/Plan:      The patient's current anticoagulation dose is Coumadin 2.5 mg tabs: use as directed per coumadin clinic.  The target INR is 2 - 3.  The next INR is due 05/13/2009.  Anticoagulation instructions were given to patient.  Results were reviewed/authorized by Eda Keys, PharmD.  He was notified by Eda Keys.         Prior Anticoagulation Instructions: INR 2.4 Continue 2.5mg s everyday except 5mg s on Sundays and Wednesdays.  Recheck in 4 weeks.    Current Anticoagulation Instructions: INR 4.1  Do NOT take coumadin tomorrow and Friday.  Then continue normal dosing schedule of 2 tablets (5 mg) on Sunday and Wednesday, and take 1 tablet (2.5 mg) all other days.  Return to clinic in 3 weeks.

## 2010-03-25 NOTE — Medication Information (Signed)
Summary: rov/tm  Anticoagulant Therapy  Managed by: Bethena Midget, RN, BSN Referring MD: Valera Castle MD PCP: Eric Form Supervising MD: Clifton Kirklin MD, Cristal Deer Indication 1: Atrial Fibrillation (ICD-427.31) Lab Used: [facility]  Site: [facility] INR POC 2.1 INR RANGE 2 - 3  Dietary changes: no    Health status changes: no    Bleeding/hemorrhagic complications: no    Recent/future hospitalizations: no    Any changes in medication regimen? no    Recent/future dental: no  Any missed doses?: no       Is patient compliant with meds? yes      Comments: Resumed coumadin and lovenox on Thursday post lung biopsy per MD who did the procedure. He did take 2.5mg s extra for 3 days, also he resumed Lovenox Q12hrs.   Allergies: 1)  ! Sulfa  Anticoagulation Management History:      The patient is taking warfarin and comes in today for a routine follow up visit.  Positive risk factors for bleeding include an age of 75 years or older.  The bleeding index is 'intermediate risk'.  Positive CHADS2 values include History of HTN and Age > 75 years old.  The start date was 07/08/2003.  His last INR was 6.0 RATIO.  Anticoagulation responsible provider: Clifton Kamilo MD, Cristal Deer.  INR POC: 2.1.  Cuvette Lot#: [id number].  Exp: 06/2010.    Anticoagulation Management Assessment/Plan:      The patient's current anticoagulation dose is Coumadin 2.5 mg tabs: use as directed per coumadin clinic.  The target INR is 2 - 3.  The next INR is due 06/08/2009.  Anticoagulation instructions were given to patient.  Results were reviewed/authorized by Bethena Midget, RN, BSN.  He was notified by Bethena Midget, RN, BSN.         Prior Anticoagulation Instructions: INR 1.4 Start Lovenox Today 100mg s subcutaneously First dose ASAP and next dose tonight at 10pm. Wednesday morning take last Lovenox at 8am, must be taken before 9am.  Continue to hold coumadin and lovenox after last injection until post procedure Dr. Donnie Coffin will  determine when to restart coumadin and Lovnex. When you do restart coumadin take extra 2.5mg s for 3 days and restart Lovenox 100mg s subcutaneously 12 hours apart. Return to clinic on 05/25/09.      Current Anticoagulation Instructions: INR 2.1 Resume 2.5mg s daily except 5mg s on Sundays and Wednesdays. Discontinue use of Lovenox. Recheck INR in 2 weeks.

## 2010-03-25 NOTE — Medication Information (Signed)
Summary: rov/tm  Anticoagulant Therapy  Managed by: Bethena Midget, RN, BSN Referring MD: Valera Castle MD PCP: Eric Form Supervising MD: Gala Romney MD, Reuel Boom Indication 1: Atrial Fibrillation (ICD-427.31) Lab Used: [facility] Site: [facility] INR POC 1.4 INR RANGE 2 - 3  Dietary changes: no    Health status changes: no    Bleeding/hemorrhagic complications: no    Recent/future hospitalizations: no    Any changes in medication regimen? no    Recent/future dental: no  Any missed doses?: yes     Details: off coumadin.Marland Kitchenlast dose 3/27  Is patient compliant with meds? yes      Comments: Pending Lung Bx on 05/21/09.  weight 241 lbs.   Allergies: 1)  ! Sulfa  Anticoagulation Management History:      The patient is taking warfarin and comes in today for a routine follow up visit.  Positive risk factors for bleeding include an age of 75 years or older.  The bleeding index is 'intermediate risk'.  Positive CHADS2 values include History of HTN and Age > 35 years old.  The start date was 07/08/2003.  His last INR was 6.0 RATIO.  Anticoagulation responsible provider: Shamaine Mulkern MD, Reuel Boom.  INR POC: 1.4.  Cuvette Lot#: [id number].  Exp: 06/2010.    Anticoagulation Management Assessment/Plan:      The patient's current anticoagulation dose is Coumadin 2.5 mg tabs: use as directed per coumadin clinic.  The target INR is 2 - 3.  The next INR is due 05/25/2009.  Anticoagulation instructions were given to patient.  Results were reviewed/authorized by Bethena Midget, RN, BSN.  He was notified by Bethena Midget, RN, BSN.         Prior Anticoagulation Instructions: INR 2.2 Continue 2.5mg s daily except 5mg s on Sundays and Wednesdays. Recheck INR on 05/19/09. Take last dose of coumadin on 05/17/09.   Current Anticoagulation Instructions: INR 1.4 Start Lovenox Today 100mgs subcutaneously First dose ASAP and next dose tonight at 10pm. Wednesday morning take last Lovenox at 8am, must be taken before 9am.   Continue to hold coumadin and lovenox after last injection until post procedure Dr. Rubin will determine when to restart coumadin and Lovnex. When you do restart coumadin take extra 2.5mgs for 3 days and restart Lovenox 100mgs subcutaneously 12 hours apart. Return to clinic on 05/25/09.     Prescriptions: ENOXAPARIN SODIUM 100 MG/ML SOLN (ENOXAPARIN SODIUM) Inject 100 mg = 1 mL subcutaneously two times a day as directed by Anticoagulation Clinic.  #16 x 1   Entered by:   Mary Parker PharmD, BCPS, CPP   Authorized by:   Thomas C Wall, MD, FACC   Signed by:   Mary Parker PharmD, BCPS, CPP on 05/19/2009   Method used:   Electronically to        [facility] [address] [id number]* (retail)       [address]       [place], [place]  [address]       Ph: [phone] or [phone]       Fax: [phone]   RxID:   [id number] COUMADIN 2.5 MG TABS (WARFARIN SODIUM) use as directed per coumadin clinic  #120 x 3   Entered by:   Bethena Midget, RN, BSN   Authorized by:   Gaylord Shih, MD, [facility]   Signed by:   Bethena Midget, RN, BSN on 05/19/2009   Method used:   Electronically to  [facility]* (mail-order)             ,          Ph: [phone]       Fax: [phone]   RxID:   [id number]

## 2010-03-25 NOTE — Medication Information (Signed)
Summary: rov/eac   Anticoagulant Therapy  Managed by: Bethena Midget, RN, BSN Referring MD: Valera Castle MD PCP: Eric Form Supervising MD: Johney Frame MD, Fayrene Fearing Indication 1: Atrial Fibrillation (ICD-427.31) Lab Used: [facility] Site: [facility] INR POC 2.2 INR RANGE 2 - 3  Dietary changes: no    Health status changes: no    Bleeding/hemorrhagic complications: no    Recent/future hospitalizations: no    Any changes in medication regimen? no    Recent/future dental: no  Any missed doses?: no       Is patient compliant with meds? yes      Comments: Pending Procedure on 3/31 Lung Biopsy.  Last dose 05/17/09 holding 3 days per Dr. Daleen Squibb. Saw Dr. Daleen Squibb today for OV.   Allergies: 1)  ! Sulfa  Anticoagulation Management History:      The patient is taking warfarin and comes in today for a routine follow up visit.  Positive risk factors for bleeding include an age of 75 years or older.  The bleeding index is 'intermediate risk'.  Positive CHADS2 values include History of HTN and Age > 32 years old.  The start date was 07/08/2003.  His last INR was 6.0 RATIO.  Anticoagulation responsible provider: Bradford Cazier MD, Fayrene Fearing.  INR POC: 2.2.  Cuvette Lot#: [id number].  Exp: 06/2010.    Anticoagulation Management Assessment/Plan:      The patient's current anticoagulation dose is Coumadin 2.5 mg tabs: use as directed per coumadin clinic.  The target INR is 2 - 3.  The next INR is due 05/19/2009.  Anticoagulation instructions were given to patient.  Results were reviewed/authorized by Bethena Midget, RN, BSN.  He was notified by Bethena Midget, RN, BSN.         Prior Anticoagulation Instructions: INR 4.1  Do NOT take coumadin tomorrow and Friday.  Then continue normal dosing schedule of 2 tablets (5 mg) on Sunday and Wednesday, and take 1 tablet (2.5 mg) all other days.  Return to clinic in 3 weeks.    Current Anticoagulation Instructions: INR 2.2 Continue 2.5mg s daily except 5mg s on Sundays and  Wednesdays. Recheck INR on 05/19/09. Take last dose of coumadin on 05/17/09.

## 2010-03-25 NOTE — Medication Information (Signed)
Summary: rov/tm  Anticoagulant Therapy  Managed by: Inactive Referring MD: Valera Castle MD PCP: Eric Form Supervising MD: Juanda Chance MD, Carrigan Delafuente Indication 1: Atrial Fibrillation (ICD-427.31) Lab Used: [facility] Site: [facility] INR POC 2.2 INR RANGE 2 - 3  Dietary changes: no    Health status changes: yes       Details: Pt dx with slow growing CA of the lung.    Bleeding/hemorrhagic complications: no    Recent/future hospitalizations: no    Any changes in medication regimen? no    Recent/future dental: no  Any missed doses?: no       Is patient compliant with meds? yes      Comments: Moving to [facility] 06/20/09.  Allergies (verified): 1)  ! Sulfa  Anticoagulation Management History:      The patient is taking warfarin and comes in today for a routine follow up visit.  Positive risk factors for bleeding include an age of 75 years or older.  The bleeding index is 'intermediate risk'.  Positive CHADS2 values include History of HTN and Age > 71 years old.  The start date was 07/08/2003.  His last INR was 6.0 RATIO.  Anticoagulation responsible provider: Juanda Chance MD, Smitty Cords.  INR POC: 2.2.  Cuvette Lot#: [id number].  Exp: 06/2010.    Anticoagulation Management Assessment/Plan:      The patient's current anticoagulation dose is Coumadin 2.5 mg tabs: use as directed per coumadin clinic.  The target INR is 2 - 3.  The next INR is due 06/29/2009.  Anticoagulation instructions were given to patient.  Results were reviewed/authorized by Inactive.  He was notified by Cloyde Reams RN.         Prior Anticoagulation Instructions: INR 2.1 Resume 2.5mg s daily except 5mg s on Sundays and Wednesdays. Discontinue use of Lovenox. Recheck INR in 2 weeks.   Current Anticoagulation Instructions: INR 2.2  Continue on same dosage 1 tablet daily except 2 tablets on Sundays and Wednesdays.  Pt will be in [place], when due for recheck will have MD monitor there.

## 2010-03-25 NOTE — Medication Information (Signed)
Summary: rov/ewj  Anticoagulant Therapy  Managed by: Cloyde Reams, RN, BSN Referring MD: Valera Castle MD PCP: Eric Form Supervising MD: Juanda Chance MD, Waylan Busta Indication 1: Atrial Fibrillation (ICD-427.31) Lab Used: [facility] Site: [facility] INR POC 2.8 INR RANGE 2 - 3  Dietary changes: no    Health status changes: no    Bleeding/hemorrhagic complications: no    Recent/future hospitalizations: no    Any changes in medication regimen? no    Recent/future dental: no  Any missed doses?: no       Is patient compliant with meds? yes       Allergies (verified): 1)  ! Sulfa  Anticoagulation Management History:      The patient is taking warfarin and comes in today for a routine follow up visit.  Positive risk factors for bleeding include an age of 75 years or older.  The bleeding index is 'intermediate risk'.  Positive CHADS2 values include History of HTN and Age > 27 years old.  The start date was 07/08/2003.  His last INR was 6.0 RATIO.  Anticoagulation responsible provider: Juanda Chance MD, Smitty Cords.  INR POC: 2.8.  Cuvette Lot#: [id number].  Exp: 03/2010.    Anticoagulation Management Assessment/Plan:      The patient's current anticoagulation dose is Coumadin 2.5 mg tabs: use as directed per coumadin clinic.  The target INR is 2 - 3.  The next INR is due 03/24/2009.  Anticoagulation instructions were given to patient.  Results were reviewed/authorized by Cloyde Reams, RN, BSN.  He was notified by Cloyde Reams RN.         Prior Anticoagulation Instructions: INR 2.8  Continue on same dosage 1 tablet daily except 2 tablets on Sundays and Wednesdays.  Recheck in 4 weeks.    Current Anticoagulation Instructions: INR 2.8  Continue on same dosage 1 tablet daily except 2 tablets on Sundays and Wednesdays.   Recheck in 4 weeks.

## 2010-05-16 LAB — PROTIME-INR
INR: 1.15 (ref 0.00–1.49)
Prothrombin Time: 14.6 seconds (ref 11.6–15.2)

## 2010-05-16 LAB — CBC
MCHC: 33.2 g/dL (ref 30.0–36.0)
RDW: 14.3 % (ref 11.5–15.5)

## 2010-05-26 LAB — BASIC METABOLIC PANEL
BUN: 18 mg/dL (ref 6–23)
CO2: 28 mEq/L (ref 19–32)
Chloride: 105 mEq/L (ref 96–112)
Creatinine, Ser: 1.12 mg/dL (ref 0.4–1.5)
GFR calc Af Amer: >60 mL/min (ref >60)
GFR calc non Af Amer: 56 mL/min — ABNORMAL LOW (ref >60)
Glucose, Bld: 109 mg/dL — ABNORMAL HIGH (ref 70–99)
Glucose, Bld: 119 mg/dL — ABNORMAL HIGH (ref 70–99)
Potassium: 4 mEq/L (ref 3.5–5.1)
Potassium: 4.1 mEq/L (ref 3.5–5.1)
Sodium: 141 mEq/L (ref 135–145)

## 2010-05-26 LAB — CBC
HCT: 47.6 % (ref 39.0–52.0)
HCT: 48.1 % (ref 39.0–52.0)
HCT: 52.4 % — ABNORMAL HIGH (ref 39.0–52.0)
Hemoglobin: 16.2 g/dL (ref 13.0–17.0)
MCHC: 34 g/dL (ref 30.0–36.0)
MCV: 91.6 fL (ref 78.0–100.0)
MCV: 91.7 fL (ref 78.0–100.0)
Platelets: 196 10*3/uL (ref 150–400)
Platelets: 223 10*3/uL (ref 150–400)
RBC: 5.23 MIL/uL (ref 4.22–5.81)
RDW: 13.9 % (ref 11.5–15.5)
RDW: 14.4 % (ref 11.5–15.5)
WBC: 9.1 10*3/uL (ref 4.0–10.5)

## 2010-05-26 LAB — COMPREHENSIVE METABOLIC PANEL
AST: 26 U/L (ref 0–37)
BUN: 24 mg/dL — ABNORMAL HIGH (ref 6–23)
CO2: 23 mEq/L (ref 19–32)
Chloride: 104 mEq/L (ref 96–112)
Creatinine, Ser: 1.31 mg/dL (ref 0.4–1.5)
GFR calc Af Amer: >60 mL/min (ref >60)
GFR calc non Af Amer: 53 mL/min — ABNORMAL LOW (ref >60)
Total Bilirubin: 0.9 mg/dL (ref 0.3–1.2)

## 2010-05-26 LAB — PROTIME-INR
Prothrombin Time: 22.6 seconds — ABNORMAL HIGH (ref 11.6–15.2)
Prothrombin Time: 24.2 seconds — ABNORMAL HIGH (ref 11.6–15.2)
Prothrombin Time: 25.4 seconds — ABNORMAL HIGH (ref 11.6–15.2)

## 2010-05-26 LAB — LIPID PANEL
HDL: 42 mg/dL (ref >39)
Total CHOL/HDL Ratio: 3.9 RATIO
Triglycerides: 109 mg/dL (ref <150)
VLDL: 22 mg/dL (ref 0–40)

## 2010-05-26 LAB — RPR: RPR Ser Ql: NONREACTIVE

## 2010-07-06 NOTE — H&P (Signed)
NAME:  [name], [name] NO.:  [id number]   MEDICAL RECORD NO.:  [id number]          PATIENT TYPE:  INP   LOCATION:  4735                         FACILITY:  [facility]   PHYSICIAN:  Larina Earthly, M.D.        DATE OF BIRTH:  1931-02-06   DATE OF ADMISSION:  01/14/2007  DATE OF DISCHARGE:                              HISTORY & PHYSICAL   CHIEF COMPLAINT:  Nausea, vomiting and abdominal pain.   HISTORY OF THE PRESENT ILLNESS:  This is a 77 year old Caucasian male  who has a history of a partial small-bowel obstruction in September 2008  in the setting of bladder cancer surgery in 2002 and abdominal hernia  repair complicated by MRSA mesh infection in 2006 with a redo surgery in  2007 who now presents with a 20-hour history of nausea, vomiting and  abdominal pain similar to his presenting symptoms in September 2008.  Other significant past medical history includes paroxysmal atrial  fibrillation in the setting of anticoagulation as well as hypertension.  The patient presented to the emergency room with his wife where he was  found to have significant abdominal pain with nausea.  He was given IV  Zofran and morphine with resolution of his symptoms.  Laboratory workup  revealed a white blood cell count at 15,000 with normal electrolytes.  He was afebrile and hemodynamically stable.  Three-view x-ray revealed  evidence of a small-bowel obstruction.  He is now admitted for further  management of his small bowel obstruction in the setting of atrial  fibrillation and remote bladder cancer.   REVIEW OF SYSTEMS:  Negative for fevers, chills, new neurologic or  musculoskeletal deficits.  Negative for chest pain, shortness of breath  or palpitations.  Negative for injury or falls.  The patient states that  he has been lifting flower pots that range from 50-100 pounds within the  last 36 hours but notes no falls or trauma to his abdominal area.  The  patient denies any change in his  bowel habits prior to the development  of symptoms at 2 a.m. on January 14, 2007.  Since that time he has not  had a bowel movement and denies passing flatus.   PROBLEM LIST:  1. History of partial small-bowel obstruction, resolved, in September      2008.  2. History of hypertension.  3. History of paroxysmal atrial fibrillation on chronic      anticoagulation followed by Dr. Juanito Doom.  4. Bladder cancer in September 2002 status post a cystoprostatectomy      and ileal neobladder with extended pelvic lymph node dissection in      August 2002, status post six cycles of Gemzar and Navelbine      completed in February 2003.  5. Status post abdominal hernia repair in 2006 with redo in 2007      complicated by chronic MRSA mesh infection requiring suppressive      antibiotic therapy.  6. Restless leg syndrome.  7. Depression.  8. Microscopic hematuria with negative evaluation.  9. Hypertriglyceridemia.  10.History of status post cataract repair.   MEDICATIONS:  The  patient is currently taking:  1. Toprol-XL 100 mg each day.  2. Coumadin 5 mg each day.  3. Keflex alternating with Macrodantin each day.  4. Lorazepam 1 mg p.r.n.  5. Aspirin 81 mg each day.  6. Potassium pills three times daily.  7. Zoloft has been discontinued recently.  8. TriCor 145 mg each day.  9. ReQuip 2 mg as needed.   CURRENT LABORATORIES:  Urinalysis that does reveal a large amount of  hemoglobin, otherwise unremarkable.  Abdominal and acute chest reveal a  small-bowel obstruction, right renal calculus, horseshoe kidney, COPD  with stable left mid lung nodule.  Lipase 40.  Sodium 139, potassium  4.9, carbon dioxide 24, glucose 145, BUN 20, creatinine 1.17, calcium  9.5, total protein 7.0, albumin 4.0.  Liver function tests normal.  White blood cell count 15.2, hemoglobin 16.7, platelet count 274.   SOCIAL HISTORY:  The patient is married, has three children, five  grandchildren and one great-grandchild.   Is a retired high school vice-  principal.  Quit smoking in 1985 but has a two-pack-per-day history for  35 years.  Quit alcohol in 1990 but has a history of heavy alcohol use,  but no drug use.   FAMILY HISTORY:  Significant for stroke, heart failure, abdominal aortic  aneurysms.   PHYSICAL EXAMINATION:  GENERAL:  We have a pleasant Caucasian male lying  flat bed in no apparent distress, answering all questions appropriately.  Temperature 97.1 degrees Fahrenheit, blood pressure 140/90, pulse at my  evaluation of 80 after the administration of morphine, respirations 18  and nonlabored, oxygen saturation 95% on room air.  Sclerae anicteric.  Extraocular movements are intact.  Face is symmetric.  There are no  oropharyngeal lesions.  There is no cervical lymphadenopathy.  NECK:  Supple.  LUNGS:  Clear to auscultation bilaterally.  There is no axillary  lymphadenopathy.  HEART:  Cardiovascular exam reveals an irregularly irregular rhythm.  ABDOMINAL EXAM:  Reveals a soft, slightly distended abdomen.  Bowel  sounds are not present.  EXTREMITIES:  Reveals no edema.  Pedal pulses are intact all four  extremities.  NEUROLOGICAL EXAM:  Grossly intact.   ASSESSMENT AND PLAN:  1. Small-bowel obstruction by plain film radiological evaluation.      Will provide IV fluids, bowel rest and obtain a CT scan and      surgical consultation in the morning per Dr. Clelia Croft.  Will also place      an NG tube if he has recurrent nausea and vomiting if p.r.n. doses      of Zofran do not suppress his symptoms.  Certainly, his most recent      hospitalization revealed a CT scan which did not reveal an      underlying etiology other than possible small-bowel adhesions from      his multiple abdominal surgeries and certainly did not reveal any      evidence of recurrence of his urological malignancy.  2. Paroxysmal atrial fibrillation.  Will hold oral beta blockade and      provide IV Lopressor as indicated for  any excessive tachycardia.      He currently is hemodynamically stable.  Coumadin will be deferred      given the possible surgical intervention if this does not resolve.      Will have pharmacy dose subcu Lovenox.  3. History of bladder cancer.  Again, no evidence on recent CT scans      from September 2008, with regular  followup with Dr. Retta Diones.  4. Hyperlipidemia.  Again, will hold Tricor given the n.p.o. status      and restart as indicated.      Larina Earthly, M.D.  Electronically Signed     RA/MEDQ  D:  01/15/2007  T:  01/15/2007  Job:  [id number]   cc:   Kari Baars, M.D.  Velora Heckler, MD  Bertram Millard. Dahlstedt, M.D.

## 2010-07-06 NOTE — Discharge Summary (Signed)
NAME:  [name], Dennis NO.:  [id number]   MEDICAL RECORD NO.:  [id number]          PATIENT TYPE:  INP   LOCATION:  4735                         FACILITY:  [facility]   PHYSICIAN:  Kari Baars, M.D.  DATE OF BIRTH:  1930/05/17   DATE OF ADMISSION:  01/14/2007  DATE OF DISCHARGE:  01/17/2007                               DISCHARGE SUMMARY   DISCHARGE DIAGNOSES:  1. Recurrent small bowel obstruction, likely secondary to adhesions,      resolved.  2. Abdominal pain, nausea/vomiting secondary to #1.  3. Hypertension.  4. Paroxysmal atrial fibrillation on chronic anticoagulation.  5. History of bladder cancer (September 2002) status post      cystoprostatectomy and ileal neobladder with extended lymph node      dissection (August 2002) followed by 6 cycles of Gemzar and      Navelbine completed in February 2003.  6. 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 Gerrit Friends.  7. Restless leg syndrome.  8. Depression.  9. Microscopic hematuria  10.Hypertriglyceridemia.  11.Status post cataract repair.   DISCHARGE MEDICATIONS:  1. Toprol XL 100 mg daily.  2. Coumadin 5 mg daily.  3. Keflex alternating with Macrodantin for chronic MRSA.  4. Lorazepam 1 mg q.h.s. p.r.n.  5. Aspirin 81 mg daily.  6. KCl.   Dictation ended at this point.      Kari Baars, M.D.     WS/MEDQ  D:  01/17/2007  T:  01/17/2007  Job:  [id number]   cc:   Velora Heckler, MD

## 2010-07-06 NOTE — Discharge Summary (Signed)
NAME:  [name], [name] NO.:  [id number]   MEDICAL RECORD NO.:  [id number]          PATIENT TYPE:  INP   LOCATION:  4735                         FACILITY:  [facility]   PHYSICIAN:  Kari Baars, M.D.  DATE OF BIRTH:  03/20/1930   DATE OF ADMISSION:  01/14/2007  DATE OF DISCHARGE:  01/17/2007                               DISCHARGE SUMMARY   Please disregard this dictation this will be continued at a later time  thank you.      Kari Baars, M.D.     WS/MEDQ  D:  01/17/2007  T:  01/17/2007  Job:  [id number]

## 2010-07-06 NOTE — Consult Note (Signed)
NAME:  [name], [name] NO.:  [id number]   MEDICAL RECORD NO.:  [id number]          PATIENT TYPE:  INP   LOCATION:  5703                         FACILITY:  [facility]   PHYSICIAN:  Velora Heckler, MD      DATE OF BIRTH:  1931/02/03   DATE OF CONSULTATION:  10/27/2006  DATE OF DISCHARGE:                                 CONSULTATION   REFERRING PHYSICIAN:  Sandra Cockayne, [facility].   CHIEF COMPLAINT:  Small-bowel obstruction.   HISTORY OF PRESENT ILLNESS:  The patient is a 77 year old white male  known to my surgical practice after umbilical herniorrhaphy and repair  of recurrent umbilical hernia in February 2007.  The patient is now  admitted on the medical service with 2-day history of nausea, vomiting,  anorexia and obstipation.  Abdominal x-ray demonstrated dilated loops of  small bowel in the upper abdomen consistent with small-bowel  obstruction.  The patient was admitted by Dr. Clelia Croft and is on ward 5700  at [facility].  General surgical evaluation is requested.   PAST MEDICAL HISTORY:  1. Status post umbilical herniorrhaphy and repair of recurrent      umbilical hernia February 2007.  2. History of atrial fibrillation.  3. History of hypertension.  4. History of depression.  5. History of bladder cancer status post cystectomy with neobladder      September 2002 by Dr. Willow Ora and Dr. Bjorn Pippin.  6. History of hyperlipidemia.  7. History of insomnia.  8. History of diverticulosis.  9. History of cataract surgery.   MEDICATIONS:  See current medication list transcribed into the chart  today.   ALLERGIES:  SULFA.   SOCIAL HISTORY:  The patient is a retired principal.  He is married.  He  lives in [place].  He does not drink.  He quit smoking in 1985.   FAMILY HISTORY:  Notable for stroke and coronary artery disease.   Fifteen-system review documented in the medical record without  significant other findings  except as noted in the above history of  present illness.   EXAMINATION:  GENERAL:  A 77 year old bright, alert white male in no  acute distress on ward 5700, [facility].  HEENT:  Shows him to be normocephalic, atraumatic.  Sclerae clear.  Conjunctivae clear.  Pupils equal and reactive.  Dentition fair.  Mucous  membranes moist.  Voice normal.  Palpation of the neck shows the airway  to be midline.  Carotid pulses are 2+.  No palpable lymphadenopathy.  No  masses.  No tenderness.  LUNGS:  Clear to auscultation bilaterally without rales, rhonchi or  wheeze.  CARDIAC EXAM:  Shows an irregular rhythm with a controlled rate,  approximately 80.  ABDOMEN:  Soft, possibly with mild distention.  Well-healed surgical  wounds are noted.  On auscultation, there are no bowel sounds.  There is  no tenderness to palpation.  There is no sign of recurrent hernia at the  umbilicus.  There is no sign of hernia from the lower midline incision.  There is no sign of inguinal hernia on palpation with Valsalva.  EXTREMITIES:  Nontender without edema.  NEUROLOGICALLY:  The patient is grossly intact.  There is no sign of  tremor.   LABORATORY STUDIES:  Admission laboratories ordered by Dr. Clelia Croft are  pending at the time of dictation.   Abdominal x-ray:  Single-view abdominal x-ray is reviewed.  This does  show dilated loops of what appear to be proximal small bowel containing  air.  There is a moderate amount of stool in the colon.  There is no  sign of free air.   IMPRESSION:  Small-bowel obstruction versus ileus.   PLAN:  1. Agree with n.p.o. status, intravenous hydration.  2. Would place nasogastric tube if nausea and vomiting persist.  3. Repeat abdominal x-ray in a.m., September 6, with two views.  4. Hold Coumadin and cover with Lovenox for atrial fibrillation.  5. Will follow closely.  6. Discussed with the patient the possibility of need for exploratory      laparotomy with lysis of  adhesions for small-bowel obstruction.  7. CT scan abdomen and pelvis to be reviewed.  Study ordered by      primary medical team.      Velora Heckler, MD  Electronically Signed     TMG/MEDQ  D:  10/27/2006  T:  10/28/2006  Job:  [id number]   cc:   Kari Baars, M.D.  Bertram Millard. Dahlstedt, M.D.

## 2010-07-06 NOTE — Consult Note (Signed)
NAME:  [name], [name]               ACCOUNT NO.:  +   MEDICAL RECORD NO.:  [id number]          PATIENT TYPE:  INP   LOCATION:  4735                         FACILITY:  [facility]   PHYSICIAN:  Ardeth Sportsman, MD     DATE OF BIRTH:  27-Sep-1930   DATE OF CONSULTATION:  DATE OF DISCHARGE:                                 CONSULTATION   REQUESTING PHYSICIAN:  Dr. Felipa Eth.   SURGEON:  Dr. Karie Soda for Dr. Darnell Level.   REASON FOR CONSULTATION:  Probable partial small bowel obstruction.   HISTORY OF PRESENT ILLNESS:  [name] is a 77 year old male who  has had recurrent periumbilical ventricle hernias.  He had a repair in  2006 with a reappearance in 2007.  There is history of MRSA infection,  but that has resolved.  He has had intermittent episodes of nausea,  vomiting and abdominal pain.  He was last admitted in early September  with a partial small bowel obstruction.  It resolved nonoperatively with  nasogastric tube decompression.  He has been under the Dr. Gerrit Friends with  our service in the past.   The patient today, had some worsening nausea, vomiting and abdominal  pain in the past day.  It intensified to the point he came to the  emergency room.  He was admitted by the medicine service and Dr. Felipa Eth  who requested surgical consultation.  The patient denies any sick  contacts or travel history.  No hematemesis or hematochezia.   PAST MEDICAL HISTORY:  1. Recurrent partial small bowel obstructions in the past.  2. Recurrent ventral hernias.  3. Methicillin-resistant Staphylococcus aureus infection in the past,      resolved after antibiotic therapy.  4. Hypertension.  5. Paroxysmal atrial fibrillation on chronic anticoagulation.  6. Bladder cancer, status post cystoprostatectomy and ileo neobladder      with pelvic lymph node dissection, status post chemotherapy,      including Gemzar.  7. Depression.  8. Restless leg syndrome.  9. Microscopic hematuria with negative  evaluations in the past.  10.Hypertriglyceridemia.   PAST SURGICAL HISTORY:  1. He has had incisional hernia repair with polyester mesh in 2004.  2. He has had a pair of recurrent umbilical incisional hernia with      Prolene mesh.  3. Bladder resection and repair in 2001 and 2002, including      neobladder.  4. Screening colonoscopy, which showed some small internal hemorrhoids      and some left-sided diverticula, but no masses or polyps seen.  5. Esophagogastroduodenoscopy, which showed some distal esophagitis.   ALLERGIES:  TO SULFA.   MEDICATIONS:  Includes Coumadin, Toprol, TriCor, Zoloft, Phenergan,  calcium, vitamin D and C, aspirin, Prilosec over-the-counter, Requip,  MetroGel, ketoconazole shampoo p.r.n., temazepam p.r.n., lorazepam,  triamcinolone cream, p.r.n. and Embeline p.r.n. for scalp irritation.   SOCIAL HISTORY:  He is married with 3 children and 5 great  grandchildren.  He is a retired Adult nurse school vice principal.  He  relocated from [place] in 1994.  He has about a 70 pack history of  tobacco, but  has not smoked in over 20 years.  Moderate alcohol in use  the past, but has been abstinent since 1990.   FAMILY HISTORY:  Cardiovascular disease in the father with a stroke and  a mother who died of heart failure in her 18s and a brother who had  abdominal aortic aneurysm ruptured at age 1.  His children are,  otherwise, healthy.   REVIEW OF SYSTEMS:  Noted per HPI.  Constitutional, ophthalmologic, ENT  and respiratory are negative.  CARDIAC:  Proximal atrial fibrillation is  well controlled at this point.  No recent episodes of pain.  GI:  No  hematochezia or melena or hematemesis.  No sick contacts.  No diarrhea.  He has not had flatus for several days.  Positive irrigation and he did  have some bilious emesis as noted per HPI.  HEPATIC AND ENDOCRINE:  Otherwise, negative.  RENAL:  He has microscopic hematuria and he has  had history of urinary tract  infections in the past.  Psych, heme, lymph  and allergic are negative.  DERMATOLOGIC:  He has some dermatological  irritations that seem well controlled on the medications noted above.  No new sores or lesions.   PHYSICAL EXAMINATION:  VITAL SIGNS:  Reviewed and are within the normal  range.  Please see the ER chart for further details.  GENERAL:  He is awake, alert and oriented x4 in no acute distress.  He  is a well-developed, well-nourished male.  PSYCH:  He is pleasant and interactive.  At least average intelligence.  No evidence of interventions, psychosis or paranoia.  HEENT:  Eyes:  Pupils equal, round and reactive to light.  Extraocular  movements are intact.  His sclerae are nonicteric or injected.  Normocephalic with no facial asymmetry.  Mucous membranes are moist.  Nasopharynx and oropharynx are clear.  NECK:  Supple without any masses.  Trachea is midline.  HEART:  A little bit irregular, irregular, but no murmurs, clicks or  rubs.  CHEST:  Clear to auscultation bilaterally.  No wheezes, rales or  rhonchi.  ABDOMEN:  Moderately distended, but soft.  No peritoneal signs.  His  incisions are well-healed with no chronic drainage.  I feel no obvious  hernia.  He has no evidence of any peritonitis.  General normal external  male genitalia with no inguinal hernias.  Rectal is deferred.  LYMPH:  No head, neck, axillary or groin lymphadenopathy.  SKIN:  No obvious petechiae purpura.  No other sores or lesions.  BREASTS:  No nipple discharge or obvious masses.  No peau d'orange.  EXTREMITIES:  No clubbing, cyanosis or edema.  MUSCULOSKELETAL:  Full range of motion of shoulders, elbows and wrists,  as well as hips, knees and ankles.  NEUROLOGICAL:  Cranial nerves II-XII are intact.  Hand grips 5/5 equal  and symmetrical.   LABORATORY DATA:  His white count is 15.2 which is slightly elevated,  hemoglobin 16.7.  His creatinine is 1.17 with a potassium of 4.9.  His  bilirubin is  2.0, which is slightly elevated.  The rest of his LFTs are  in the normal range.  Urinalysis shows a large amount of blood and some  leukocytes, but no nitrites.  He has a little bit of cloudy urine.  Three-view of the abdomen shows no obvious effusions or pulmonary  issues, but he does have dilated small bowel loops.   ASSESSMENT/PLAN:  A 77 year old male with recurrent episodes of nausea,  vomiting and abdominal pain with evidence  of partial small bowel  obstruction in the past with history and physical exam concerning for  bowel obstruction.   1. Admit.  2. IV fluids.  3. We will follow with primary service.  4. NG tube if he has repeated emesis.  5. CT scan with oral and IV contrast to evaluate and rule out any      abscess or any other abnormalities with history of bladder cancer      to make sure there is no evidence of recurrence.  He has had      negative lower endoscopy done within the past year, which argues      against any new colonic lesion.  6. Hold his Coumadin and switch over to just Lovenox temporarily in      case surgery is required.  7. Exploration of the abdomen, if he does not improve.  8. Followup urinalysis, consider a urine culture and treat if needed,      given history of ileus with urinary tract infection in the past.  9. Control depression.  10.Control hypertriglyceridemia.  11.No evidence of any wound infection or abscesses at this point.      Follow expectantly.  12.We will ask Dr. Gerrit Friends to see the patient tomorrow or a physician      in our group of his choosing.      Ardeth Sportsman, MD  Electronically Signed     SCG/MEDQ  D:  01/15/2007  T:  01/15/2007  Job:  [id number]

## 2010-07-06 NOTE — Discharge Summary (Signed)
NAME:  [name], [name] NO.:  [id number]   MEDICAL RECORD NO.:  [id number]          PATIENT TYPE:  INP   LOCATION:  5703                         FACILITY:  [facility]   PHYSICIAN:  Kari Baars, M.D.  DATE OF BIRTH:  1930-09-25   DATE OF ADMISSION:  10/27/2006  DATE OF DISCHARGE:  10/30/2006                               DISCHARGE SUMMARY   DISCHARGE DIAGNOSES:  1. Partial small-bowel obstruction, resolved.  2. Nausea and vomiting secondary to #1.  3. Diarrhea.  4. Hypertension.  5. Paroxysmal atrial fibrillation on chronic anticoagulation.  6. Bladder cancer (September 2002), status post cystoprostatectomy and      ileal neobladder with extended pelvic lymph node dissection (October 20, 2000), status post 6 cycles of Gemzar and Navelbine completed      on April 05, 2001.  7. Status post abdominal hernia repair (2006 with a redo in 2007),      complicated by chronic methicillin-resistant Staph aureus mesh      infection requiring suppressive antibiotic therapy.  8. Restless leg syndrome.  9. Depression.  10.Microscopic hematuria with negative evaluation.  11.Hypertriglyceridemia.  12.Status post cataract repair.   DISCHARGE MEDICATIONS:  1. Phenergan 12.5 mg q.6 hours p.r.n. nausea.  2. Zoloft 50 mg daily/  3. Tricor 145 mg daily.  4. Toprol XL 100 mg daily.  5. Coumadin 5 mg on Tuesday, Thursday, Saturday and Sunday and 2.5 mg      on Monday, Wednesday and Friday for atrial fibrillation.  6. Keflex 500 mg nightly x3 months, followed by Macrobid 100 mg      nightly x3 months for suppressive therapy for chronic MRSA      infection per general surgery.  7. Calcium 600 mg b.i.d.  8. Vitamin D 400 mg daily.  9. Vitamin C 500 mg daily.  10.Aspirin 81 mg daily.  11.Prilosec OTC.  12.Requip 2 mg nightly.  13.MetroGel 1% 60 grams topical p.r.n.  14.Ketoconazole shampoo p.r.n.  15.Temazepam 30 mg nightly p.r.n.  16.Lorazepam 1 mg p.r.n.  17.Triamcinolone cream p.r.n.  18.Embeline 0.05% solution p.r.n. scalp irritation.   HOSPITAL PROCEDURES:  1. CT of the abdomen and pelvis with contrast (October 27, 2006).      Findings consistent with small bowel obstruction with a transition      point in the mid to distal ileum.  No evidence for metastatic      disease.  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  2. Three-view abdomen (September 6), compatible with a small bowel      obstruction with degree of small bowel distention improved compared      to prior exams.   HISTORY OF PRESENT ILLNESS:  For full details, please see dictated  history and physical.  Briefly, [name] is a 77 year old white  male with a history of bladder cancer, status post resection with  chemotherapy (September 2002), abdominal hernia repair x2 complicated by  chronic MRSA mesh infection, and paroxysmal atrial fibrillation on  anticoagulation, who presented to the office on September 5th with  nausea and vomiting, and abdominal pain for  2 days.  He states that his  nausea and vomiting and diffuse abdominal pain began abruptly after  playing tennis.  He had watery emesis with no hematemesis.  He had not  had a bowel movement in 2 days.  He also noted hiccupping.  He presented  to the office were exam revealed hypoactive to absent bowel sounds.  Exam was otherwise benign.  He was sent for a KUB that showed a high-  grade partial small bowel obstruction with dilation of the small bowel  to 5 cm.  Given his history and radiographic findings, he was admitted  for partial small-bowel obstruction.   HOSPITAL COURSE:  The patient was admitted to a medical bed.  He was  placed on a clear-liquid diet and given IV fluid hydration and Phenergan  as needed.  CT of the abdomen and pelvis was performed to further  evaluate the cause of his partial small bowel obstruction, and this did  confirm a small bowel obstruction with the transition  point at the mid  to distal ileum.  There was no evidence of metastatic disease as the  underlying cause.  This was felt due to adhesions.  General surgery was  consulted and followed the patient throughout this hospitalization.  With supportive therapy, the patient's symptoms improved rapidly.  He  tolerated advancement of his diet to a regular diet prior to discharge.  He did start having bowel movements, and in fact develop diarrhea prior  to discharge.  His labs remained stable throughout his hospitalization.  Of note, his Coumadin was held for possible surgery, and his INR is 1.7  on the day of discharge.  This will be was resumed at the prior dose for  his atrial fibrillation.  At this point, the patient is tolerating a  regular diet with resolution of his abdominal pain and nausea and  vomiting.  He is felt stable for discharge home.   DISCHARGE LABORATORY DATA:  CBC on September 7 showed a white count of  6, hemoglobin 13.4, platelets 210.  CMET revealed sodium 139, potassium  3.98, chloride 105, bicarbonate 26, BUN 18, creatinine 1.2, glucose 101.  Liver function tests are normal.  INR 1.7.  TSH 1.13.   DISCHARGE DIET:  Cardiac prudent.   DISPOSITION:  To home.      Kari Baars, M.D.  Electronically Signed     WS/MEDQ  D:  10/30/2006  T:  10/30/2006  Job:  [id number]   cc:   Velora Heckler, MD

## 2010-07-06 NOTE — Discharge Summary (Signed)
NAME:  TENNYSON, WACHA NO.:  [id number]   MEDICAL RECORD NO.:  [id number]          PATIENT TYPE:  INP   LOCATION:  4735                         FACILITY:  [facility]   PHYSICIAN:  Kari Baars, M.D.  DATE OF BIRTH:  [birth year]   DATE OF ADMISSION:  01/14/2007  DATE OF DISCHARGE:  01/17/2007                               DISCHARGE SUMMARY   DISCHARGE DIAGNOSES:  1. Recurrent small bowel obstruction likely secondary to adhesions      resolved.  2. Nausea and vomiting and abdominal pain secondary to #1.  3. Paroxysmal atrial fibrillation on chronic anticoagulation.  4. Hypertension.  5. Bladder cancer (September 2002) status post cystoprostatectomy and      ileal neobladder with extended pelvic lymph node dissection in      August 2002, status post six cycles of Gemzar and Navelbine      completed in February 2003.  6. 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  7. Restless leg syndrome.  8. Depression.  9. Microscopic hematuria.  10.Hypertriglyceridemia.  11.Status post cataract repair.   DISCHARGE MEDICATIONS:  1. Phenergan 12.5 mg q.6 h. p.r.n.  2. Fenofibrate 160 mg daily to replace Tricor 145 mg daily.  3. Toprol XL 100 mg daily.  4. Coumadin 2.5 mg daily except 5 mg on Tuesday, Wednesday, and      Thursday per Dr. Daleen Squibb.  5. Keflex 10 mg q.8 h. alternating with Macrobid 100 mg q.8 h. per Dr.      Gerrit Friends.  6. Calcium 600 mg b.i.d.  7. Vitamin D 400 mg b.i.d.  8. Aspirin 81 mg daily.  9. Prilosec 20 mg daily.  10.Requip 2 mg q.h.s.  11.MetroGel 1% topical lotion p.r.n.  12.Ketoconazole shampoo p.r.n.  13.Temazepam 30 mg q.h.s.  14.Lorazepam 1 mg p.r.n.  15.Triamcinolone cream 0.025% p.r.n.  16.Embeline 0.05% solution p.r.n. scalp irritation.   HOSPITAL PROCEDURES:  CT of the abdomen  and pelvis with contrast  (November 24).  Horseshoe kidney with nonobstructing right renal  calculus and dilated small bowel loops with transition from dilated to  nondilated small bowel in the upper to mid right pelvis with what  appears to be a prior small bowel anastomosis compatible with stricture.  Colonic diverticulosis without diverticulitis.  Prior bladder surgical  changes.   HOSPITAL CONSULTS:  General surgery (Dr. Guadlupe Spanish and Dr. Gerrit Friends).   HISTORY OF PRESENT ILLNESS:  For full details please see dictated  History and Physical by Dr. Felipa Eth.  Briefly Mr. Aken is a 76-year-  old white male with a history of partial small bowel obstruction  (September 2008) in the setting of prior bladder surgery (2002),  abdominal hernia repair with chronic MRSA infection (2006 and 2007), and  atrial fibrillation on chronic anticoagulation who presented to the  emergency department on November 22 with nausea, vomiting, and abdominal  pain of acute onset.  He describes similar symptoms as he had back in  September 2008 when he had a  small bowel obstruction.  In the emergency  department he was found to have dilated air filled loops in the  midabdomen compatible with small bowel obstruction.  CT was ordered on  admission.   HOSPITAL COURSE:  The patient was admitted to a medical bed.  CT was  obtained and did show a complete small bowel obstruction with transition  point in the upper to mid right pelvis.  He was placed on bowel rest.  NG tube was held due to quick improvement in his symptoms.  He was given  Phenergan and morphine for his nausea and abdominal pain.  General  surgery consult was obtained, and they recommended conservative  measures.  He remained stable throughout his hospital course with rapid  resolution of his symptoms.  He was kept n.p.o. for 24 hours, and then  started on a clear liquid diet.  This was advanced to a regular diet  without any recurrent abdominal pain,  nausea, vomiting, fevers.  He did  have three bowel movements, and was tolerating p.o. diet well.  Dr.  Gerrit Friends recommended follow-up in the office in two weeks to consider  additional treatment options due to his recurrent small bowel  obstructions.  The plan is for an upper GI small-bowel follow-through  study at that time.  Of note the patient's Coumadin was held throughout  this hospitalization due to potential need for surgery.  He was kept on  a heparin drip during this hospitalization.  His Coumadin will be  resumed.   The patient is now stable for discharge home with outpatient follow-up.   DISCHARGE LABORATORIES:  CBC on the day of discharge shows an  improvement in his white blood count from 15.2 on admission to 6.4.  Hemoglobin is stable at 13.7, platelets 226.  INR 1.3.  CMET on the date  prior to discharge showed sodium 141, potassium 3.4, chloride 104,  bicarb 27, BUN 17, creatinine 1.2, glucose 86.  Liver function tests  were normal.   DISCHARGE INSTRUCTIONS:  He was instructed to call if he has increasing  abdominal pain, nausea, vomiting or fever.   HOSPITAL FOLLOWUP:  He will follow up with Dr. Gerrit Friends in two weeks.  He  will follow up with Dr. Clelia Croft as previously scheduled.   DISPOSITION:  To home.   DISCHARGE DIET:  Full liquids, advance as tolerated to cardiac prudent  diet.      Kari Baars, M.D.  Electronically Signed     WS/MEDQ  D:  01/17/2007  T:  01/17/2007  Job:  [id number]   cc:   Velora Heckler, MD

## 2010-07-06 NOTE — H&P (Signed)
NAME:  [name], [name] NO.:  [id number]   MEDICAL RECORD NO.:  [id number]          PATIENT TYPE:  INP   LOCATION:  5703                         FACILITY:  [facility]   PHYSICIAN:  Kari Baars, M.D.  DATE OF BIRTH:  Jun 22, 1930   DATE OF ADMISSION:  10/27/2006  DATE OF DISCHARGE:                              HISTORY & PHYSICAL   CHIEF COMPLAINT:  Abdominal pain, nausea/vomiting.   HISTORY OF PRESENT ILLNESS:  The patient is a 77 year old white male  with a history of bladder cancer, status post resection and chemotherapy  (09/02), abdominal hernia repair times two complicated by chronic  methicillin resistant Staph. aureus mesh infection, and paroxysmal  atrial fibrillation, who presented to our office today with a complaint  of nausea and vomiting and abdominal pain over the past two days.  He  states that he was in his usual state of health and played tennis two  days ago.  Following this he has had several episodes of nausea and  vomiting and diffuse abdominal pain.  He has not noticed any hematemesis  or melena.  Emesis is described as watery.  He has had poor p.o. intake  as the p.o. intake increases his abdominal pain and bloating.  He has  had a minimal amount of flatulence.  He also noticed a significant  increase in hiccups.  He was seen in our office this morning and the  examination was concerning for a possible small bowel obstruction.  He  was referred for a KUB which did reveal a high grade partial small bowel  obstruction and is being admitted for further management.   PAST MEDICAL HISTORY:  1. Bladder cancer (09/02), status post cystoprostatectomy and ileal      neobladder with extended pelvic lymph node dissection (10/20/00),      status post six cycles of Gemzar and Navelbine, completed 04/05/01.  2. Abdominal hernia repair (2006, redo 2007), complicated by a chronic      methicillin resistant Staph. aureus mesh infection requiring long      term  antibiotics.  3. Paroxysmal atrial fibrillation, on Coumadin therapy.  4. Restless leg syndrome.  5. Depression.  6. Microscopic hematuria with negative evaluation other than above.  7. Hypertriglyceridemia.  8. Status post cataract repair.   CURRENT MEDICATIONS:  1. Toprol-XL 100 mg daily.  2. Coumadin 5 mg daily.  3. Keflex 500 mg q.h.s.  4. Vicodin 5/500 daily.  5. Valium 10 mg p.r.n.  6. Aspirin 81 mg daily.  7. Potassium daily.  8. Zoloft 50 mg daily.  9. Tricor 145 mg daily.  10.Requip 2 mg q.h.s.  11.Lorazepam 1 mg p.r.n.   SOCIAL HISTORY:  He is married with three children, five great-  grandchildren, and one great-grandchild.  He moved from [place] to  [place] in 1994.  He is a retired Personal assistant.  He  quit smoking in 1985 but has a two pack per day for 35 years smoking  history, quit alcohol in 1990 but has a history of heavy alcohol use, no  drug use.   FAMILY HISTORY:  Father had a stroke.  Mother died of heart failure in  her 64s, had a brother who died suddenly of an abdominal aortic aneurysm  at age 59, son and two daughters are healthy.   REVIEW OF SYSTEMS:  All systems were reviewed with the patient and are  negative except as in the HPI.   PHYSICAL EXAMINATION:  VITAL SIGNS:  Temperature of 100.0 with an ear  thermometer, 97.7 orally.  Pulse 88, blood pressure 126/82, weight 235  pounds which is down three pounds.  GENERAL:  The patient is pleasant,  in some obvious discomfort but no acute distress.  HEENT: Oropharynx is moist without erythema.  NECK:  The neck is supple without lymphadenopathy, JVD, or carotid  bruits.  HEART:  Regular rate and rhythm without murmurs, rubs, or gallops.  LUNGS:  The lungs are clear to auscultation bilaterally.  ABDOMEN:  The abdomen is soft, nondistended with hypoactive bowel sounds  which are minimally present, no significant tenderness, rebound, or  guarding.  EXTREMITIES:  No clubbing, cyanosis,  or edema.  NEUROLOGICAL:  Examination is intact.   LABORATORY STUDIES:  CBC obtained in the office shows a white count of  9.6, hemoglobin 11.9, and platelets 298.  The laboratory studies are  significant for a sodium of 141, potassium 4.1, chloride 104, bicarb 23,  BUN 21, creatinine 1.3, and glucose 157.  Liver function tests are  normal.  Calcium is mildly elevated at 10.7.   KUB - shows a high grade partial small bowel obstruction up to 5 cm.   ASSESSMENT AND PLAN:  1. Abdominal pain and nausea and vomiting secondary to a partial small      bowel obstruction - Will admit for supportive therapy including IV      fluid hydration, clear liquid diet, Phenergan as needed for nausea,      and close observation of his partial small bowel obstruction.  Will      obtain an abdominal and pelvic CT to further evaluate underlying      etiology.  Differential includes adhesions secondary to his prior      surgeries, recurrence of his bladder cancer, mesenteric ischemia in      the setting of his atrial fibrillation, complications from his      hernia or ileus.  Will follow serial KUBs to monitor progression.  2. Paroxysmal atrial tachycardia - Will hold his Coumadin in case he      needs surgery.  Will continue his aspirin for now.  Obtain a      PT/INR, continue Toprol-XL for rate control, currently in a normal      sinus rhythm.  3. History of bladder cancer - No evidence of disease on most recent      followup.  4. Hypercalcemia - Will monitor this with hydration.   DISPOSITION:  Anticipate discharge to home once he is tolerating diet  and his small bowel obstruction has resolved.      Kari Baars, M.D.  Electronically Signed     WS/MEDQ  D:  10/27/2006  T:  10/28/2006  Job:  [id number]   cc:   Kari Baars, M.D.  Velora Heckler, MD

## 2010-07-09 NOTE — Op Note (Signed)
[facility]  Patient:    HOMER, MILLER Visit Number: [id number] MRN: [id number]          Service Type: SUR Location: 3W [id number] 01 Attending Physician:  Evlyn Clines Proc. Date: 10/20/00 Admit Date:  10/20/2000   CC:         Heather Roberts, M.D.   Operative Report  PROCEDURE:  Exam under anesthesia, transurethral resection of bladder tumor greater than 5 cm.  PREOPERATIVE DIAGNOSIS:  Bladder tumor.  PREOPERATIVE DIAGNOSIS:  Bladder tumor.  SURGEON:  Excell Seltzer. Annabell Howells, M.D.  ANESTHESIA:  General.  DRAIN:  22 French Foley catheter.  SPECIMEN:  Bladder tumor specimen and prostatic urethral biopsy.  COMPLICATIONS:  None.  INDICATIONS:  Mr. Ding is a 77 year old white male, who presented with gross hematuria earlier this month.  Evaluation in the office revealed a horseshoe kidney with no renal or ureteral abnormalities.  Cystoscopy demonstrated a large, sessile-appearing bladder tumor on the posterior wall of the bladder.  In light of the sessile-appearing nature of the tumor, a CT scan was performed.  This suggested involvement out to the serosa on this tumor on the posterior wall of the bladder.  Additionally, there was the suggestion of lymphadenopathy in the right inguinal area, and the patient had been having some right testicular discomfort.  It was felt that transurethral resection bladder tumor was required for further diagnostic testing as well as control of bleeding.  FINDINGS AT PROCEDURE:  The patient was taken to the operating room after receiving p.o. Tequin, and general anesthetic was induced.  He was placed in the lithotomy position.  Bimanual exam under anesthesia was performed.  There was a suggestion of a mass superior to the prostate, but there was no discrete lesion, and the bladder remained quite mobile.  After exam, his perineum and genitalia were prepped with Betadine solution, and he was draped in the  usual sterile fashion.  A 28 French continuous-flow resectoscope sheath was inserted.  This was fitted with an Wandra Scot handle with a 24 loop and a 12 degree lens.  The tumor was identified on the posterior wall of the bladder. It was surrounded by diffuse carcinoma in situ that covered a large portion of the left lateral wall of the bladder down to the level of the bladder neck and trigone.  The ureteral orifices were unremarkable.  After thorough inspection, resection of the tumor was performed, removing all bulky tumor.  The area of excision measured approximately 6-8 cm within the full bladder.  Once the tumor had been resected back to muscular fibers, and no obvious residual evidence of bulky tumor was identified, hemostasis was achieved, and the bladder was evacuated free of the specimen.  Some additional fulguration of the area surrounding the tumor for the obvious carcinoma in situ was performed; however, it was so extensive that I did not feel it was possible to eliminate the entire tumor burden.  After this had been performed and the bladder tumor specimens had been removed from the field, a single biopsy was obtained from the prostatic urethra with the TUR loop for further staging. This area was fulgurated to provide hemostasis.  At this point, reinspection revealed no retained tissue and no active bleeding.  A 22 French Foley catheter was inserted after removal of the scope.  The balloon was filled with 22 cc of sterile fluid.  It was irrigated by hand until clear and then placed on straight drainage.  At this point, the patient was  taken down from lithotomy position.  His anesthetic was reversed.  He was moved to the recovery room in stable condition.  There were no complications during his procedure. Attending Physician:  Evlyn Clines DD:  10/20/00 TD:  10/20/00 Job: [id number]

## 2010-07-09 NOTE — Op Note (Signed)
NAME:  [name], [name] NO.:  [id number]   MEDICAL RECORD NO.:  [id number]          PATIENT TYPE:  AMB   LOCATION:  ENDO                         FACILITY:  [facility]   PHYSICIAN:  Anselmo Rod, M.D.  DATE OF BIRTH:  February 04, 1931   DATE OF PROCEDURE:  01/10/2005  DATE OF DISCHARGE:                                 OPERATIVE REPORT   PROCEDURE:  Screening colonoscopy.   ENDOSCOPIST:  Anselmo Rod, M.D.   INSTRUMENT USED:  Olympus videocolonoscope.   INDICATIONS FOR PROCEDURE:  The patient is a 77 year old white male with a  history of questionable prostate cancer undergoing a screening colonoscopy.  The patient has a history of blood in his stool on routine physical on  several occasions in the past.  Rule out colonic polyps, masses, etc.   PREPROCEDURE PREPARATION:  Informed consent was secured from the patient.  The patient was fasted for eight hours prior to the procedure and prepped  with a bottle of magnesium citrate and a gallon of GoLYTELY the night prior  to the procedure.  The risks and benefits of the procedure, including a 10%  missed rate of cancer and polyps, was discussed with the patient as well.  His Coumadin was held for five days prior to the procedure after procuring  permission from Dr. Juanito Doom for this.   PREPROCEDURE PHYSICAL EXAMINATION:  VITAL SIGNS:  Stable.  NECK:  Supple.  CHEST:  Clear to auscultation.  S1 and S2 regular.  ABDOMEN:  Soft with normal bowel sounds.   DESCRIPTION OF PROCEDURE:  The patient was placed in the left lateral  decubitus position and sedated with 50 mg of Demerol and 5 mg of Versed in  slow incremental doses.  Once the patient was adequately sedated and  maintained on low-flow oxygen and continuous cardiac monitoring, the Olympus  videocolonoscope was advanced from the rectum to the cecum with difficulty.  There was a large amount of residual stool in the colon.  Multiple washings  were done.  Small internal  hemorrhoids were seen on retroflexion.  A few  early diverticula were seen in the rectosigmoid colon.  The rest of the exam  was unremarkable.  No masses or polyps were identified.  The appendiceal  orifice and ileocecal valve were clearly visualized and photographed.  The  terminal ileum appearing healthy and without lesions.  Small lesions could  be missed secondary to relatively poor prep.  The patient tolerated the  procedure well without complications.   IMPRESSION:  1.  Small nonbleeding internal hemorrhoids.  2.  Few early left-sided diverticula in the rectosigmoid colon.  3.  Large amount of residual stool in the colon.  Small lesions could be      missed.  4.  No masses or polyps seen.  5.  Normal terminal ileum.   RECOMMENDATIONS:  1.  Continue a high fiber diet with liberal fluid intake.  2.  Outpatient followup in the next two weeks or earlier if need be.  3.  An EGD versus an upper GI series with small-bowel follow-through will be  done if the patient continues to have guaiac-positive stools in the      future.  4.  Resume Coumadin use today.      Anselmo Rod, M.D.  Electronically Signed     JNM/MEDQ  D:  01/10/2005  T:  01/10/2005  Job:  [id number]   cc:   Martha Clan, M.D.   Thomas C. Wall, M.D.  [address]  [address]  [place]  [place] [address]   Pierce Crane, M.D.  Fax: [phone]   Bertram Millard. Dahlstedt, M.D.  Fax: [phone]

## 2010-07-09 NOTE — Op Note (Signed)
NAME:  [name], [name] NO.:  [id number]   MEDICAL RECORD NO.:  [id number]          PATIENT TYPE:  AMB   LOCATION:  DSC                          FACILITY:  [facility]   PHYSICIAN:  Velora Heckler, MD      DATE OF BIRTH:  [birth year]   DATE OF PROCEDURE:  03/25/2005  DATE OF DISCHARGE:                                 OPERATIVE REPORT   PREOP DIAGNOSIS:  Recurrent umbilical hernia (incisional hernia).   POSTOPERATIVE DIAGNOSIS:  Recurrent umbilical hernia (incisional hernia).   PROCEDURE:  Repair recurrent umbilical incisional hernia with Prolene Hernia  System (PHS) mesh.   SURGEON:  Velora Heckler MD, FACS   ANESTHESIA:  General.   ESTIMATED BLOOD LOSS:  Minimal   PREPARATION:  Betadine.   COMPLICATIONS:  None.   INDICATIONS:  The patient is a 77 year old white male who had undergone  repair of umbilical hernia with polyester mesh in December2004.  Approximately two months ago the patient noted a recurrent bulge at the  umbilicus. This has progressed slightly. It causes minor discomfort. On  evaluation in the office, he was diagnosed with a recurrent hernia at the  site of his previous repair. He now comes to surgery for repair.   BODY OF REPORT:  The procedure was done in OR #1 at the [facility] surgery  center. The patient is brought to the operating room, placed in supine  position on the operating room table. Following administration of general  anesthesia the patient is prepped and draped in usual strict aseptic  fashion. After ascertaining that an adequate level of anesthesia been  obtained, previous umbilical wound is reopened transversely with a #15  blade. Wound was extended both to the left and the right slightly. Skin  flaps were developed. Hernia sac is encountered. It appears that the  recurrence is just inferior to the previous repair in the midline. It is  actually at the superior portion of the patient's previous lower midline  incision.  Dissection was carried down to the fascia. Hernia defect is  defined. Fascial planes were developed circumferentially including elevation  of the umbilicus off of the previous mesh repair. Hernia defect is  developed. Adhesions of small bowel to the hernia sac were lysed sharply  with the Metzenbaum scissors and small bowel reduced back within the  peritoneal cavity. Fascial plane is developed internally as well. A large  Prolene hernia system mesh was brought on the field. The internal portion of  the mesh was trimmed slightly with the suture scissors. Mesh was inserted  through the defect and splayed out circumferentially. The hernia defect  edges were then secured to the hub of the mesh with interrupted 2-0 Novofil  sutures. The mesh onlay is then extended over the anterior fascia and  secured circumferentially with interrupted 2-0 Novofil sutures. Good  approximation of the mesh was noted. There was wide overlap of approximately  3-4 cm in all directions. Good hemostasis was noted. Umbilicus was  reattached to the abdominal wall with interrupted 2-0 Novofil suture.  Subcutaneous tissues were closed with interrupted 3-0 Vicryl sutures. Skin  edges  were anesthetized with 20 mL of local anesthetic. Skin edges  were reapproximated with a running 4-0 Vicryl subcuticular suture. Wound is  washed and dried. Benzoin, Steri-Strips were applied. Sterile dressings were  applied. The patient is awakened from anesthesia and brought to the recovery  room in stable condition. The patient tolerated the procedure well.      Velora Heckler, MD  Electronically Signed     TMG/MEDQ  D:  03/25/2005  T:  03/25/2005  Job:  [id number]   cc:   Kari Baars, M.D.  Fax: [phone]

## 2010-07-09 NOTE — Assessment & Plan Note (Signed)
[facility]                            CARDIOLOGY OFFICE NOTE   [name], [name]                  MRN:          [id number]  DATE:03/27/2006                            DOB:          [birth year]    HISTORY:  [name] returns today for further management of  paroxysmal atrial fibrillation.  I saw him last on March 04, 2005.  He  continues on Coumadin without complication.  He is on Toprol XL 100 mg a  day.   Fortunately, he has not had any symptomatic atrial fibrillation.  He had  this in the past.  He denies any presyncope/syncope, dyspnea on  exertion, shortness of breath, orthopnea, PND, peripheral edema.  He has  had no melena or hematochezia.   MEDICATIONS:  His cardiac meds are Toprol XL 100 mg a day, Coumadin as  directed.   EXAMINATION:  VITAL SIGNS:  His blood pressure today is 120/75, pulse 81  and regular, he is in sinus rhythm with a left axis deviation which is  unchanged.  His weight is 229.  He is in no acute distress.  SKIN:  Warm and dry.  HEENT:  Normocephalic, atraumatic.  PERRLA.  Extraocular movements  intact.  Sclerae are clear.  Facial symmetry is normal.  Carotids are  full bilaterally without bruits.  There is no JVD.  Thyroid is not  enlarged.  Trachea is midline.  CHEST:  Lungs are clear to auscultation.  Heart reveals a nondisplaced  PMI.  He has normal S1 and S2 with a split S2.  There is a very faint  systolic murmur along the left sternal border.  ABDOMEN:  Abdominal exam is soft with good bowel sounds.  There is no  tenderness.  EXTREMITIES:  Reveal no cyanosis, clubbing, or edema.  NEUROLOGIC:  Exam is intact.   ASSESSMENT AND PLAN:  [name] is doing well.  He has had no  recurrent symptomatic atrial fibrillation.  Toprol and Coumadin will be  continued.  I will plan on seeing him back in a year or p.r.n.     Thomas C. Daleen Squibb, MD, [facility]  Electronically Signed    TCW/MedQ  DD: 03/27/2006   DT: 03/27/2006  Job #: [id number]   cc:   [name] Office

## 2010-07-09 NOTE — Op Note (Signed)
NAME:  [name], [name] NO.:  [id number]   MEDICAL RECORD NO.:  [id number]          PATIENT TYPE:  AMB   LOCATION:  ENDO                         FACILITY:  [facility]   PHYSICIAN:  Anselmo Rod, M.D.  DATE OF BIRTH:  1930/08/02   DATE OF PROCEDURE:  02/02/2005  DATE OF DISCHARGE:                                 OPERATIVE REPORT   PROCEDURE PERFORMED:  Esophagogastroduodenoscopy.   ENDOSCOPIST:  Anselmo Rod, M.D.   INSTRUMENT USED:  Olympus video panendoscope.   INDICATION FOR PROCEDURE:  Guaiac-positive stools in a 77 year old white  male.  Rule out peptic ulcer disease, esophagitis, gastritis, etc.  The  patient had an unrevealing colonoscopy in the recent past.   PREPROCEDURE PREPARATION:  Informed consent was procured from the patient.  The patient's aspirin and Coumadin were held for five days prior to the  procedure after discussion and permission from Dr. Daleen Squibb.  The patient was  fasted for eight hours prior to the procedure.   PREPROCEDURE PHYSICAL:  VITAL SIGNS:  The patient had stable vital signs.  NECK:  Supple.  CHEST:  Clear to auscultation.  S1, S2 regular.  ABDOMEN:  Soft with normal bowel sounds.   DESCRIPTION OF PROCEDURE:  The patient was placed in the left lateral  decubitus position and sedated with 50 mcg of fentanyl and 7 mg of Versed in  slow incremental doses given intravenously.  Once the patient was adequately  sedate and maintained on low-flow oxygen and continuous cardiac monitoring,  the Olympus video panendoscope was advanced through the mouthpiece, over the  tongue and esophagus under direct vision.  Mild distal esophagitis was noted  above the Z-line.  The rest of the gastric mucosa appeared normal and so did  the proximal small bowel.  There was no outlet obstruction.  There were no  ulcerations, strictures, no masses or Barrett's mucosa noted in the rest of  the esophagus.  The patient tolerated the procedure well without  complications.   IMPRESSION:  Grade 1 distal esophagitis, otherwise normal EGD.   RECOMMENDATIONS:  1.Discontinue Tums and Rolaids.  2.Use OTC Zantac or Prilosec instead.  3.Small bowel follow-through.  This will be done to complete his evaluation  and if this is unrevealing, capsule endoscopy will be considered.  4.Patient is to resume the use of his Coumadin and Aspirin today.      Anselmo Rod, M.D.  Electronically Signed     JNM/MEDQ  D:  02/02/2005  T:  02/03/2005  Job:  [id number]   cc:   Kari Baars, M.D.  Fax: [phone]   Jesse Sans. Wall, M.D.  [address]  [address]  [place]  [place] [address]   Pierce Crane, M.D.  Fax: [phone]

## 2010-07-09 NOTE — Op Note (Signed)
[facility]  Patient:    [name], [name] Visit Number: [id number] MRN: [id number]          Service Type: SUR Location: 3W [id number] 01 Attending Physician:  Evlyn Clines Dictated by:   Excell Seltzer. Annabell Howells, M.D. Proc. Date: 11/20/00 Admit Date:  11/20/2000                             Operative Report  PREOPERATIVE DIAGNOSIS:   Muscle invasive bladder cancer.  POSTOPERATIVE DIAGNOSIS:  Muscle invasive bladder cancer with right ileac adenopathy.  PROCEDURE:  Radical cystoprostatectomy with ileoneobladder.  SURGEON:  Excell Seltzer. Annabell Howells, M.D. and Bertram Millard. Dahlstedt, M.D.  ANESTHESIA:  General.  SPECIMEN:  Bladder, prostate, seminal vesicles, right and left pelvic nodes, right and left distal ureteral margins.  DRAINS:  #10 Blake drain, 22-French hematuria catheter and bilateral ureteral stents.  COMPLICATIONS:   None.  INDICATIONS:  [name] is a 77 year old white male who presented with hematuria in August.  He was found to have a muscle invasive bladder cancer involving the posterior wall of the bladder.  CT scan suggested some lymphadenopathy in the right obturator fossa area but this was not amenable to percutaneous biopsy.  He was also noted to have a small 1 cm lesion in the lung, possibly granuloma although tumor could not be ruled out.  After discussing the options with the patient, he has elected to undergo radical cystoprostatectomy with creation of an ileoneobladder.  Bertram Millard. Dahlstedt, M.D. will perform the neobladder, I will perform the cystectomy.  He has been seen by oncology.  He will follow up with them as well.  FINDINGS AND PROCEDURE:  The patient underwent an outpatient mechanical and antibiotic bowel prep.  He received 1 g of Ancef.  He was taken to the operating room where general anesthetic was induced.  He was placed in the supine position.  His abdomen was shaved.  He was prepped with Betadine solution and  draped in the usual fashion.  A Foley catheter was placed per urethra and the bladder was drained.  The catheter was then plugged.  A lower midline incision was made from the pubis to just below the umbilicus with the knife.  This was carried down through the subcutaneous tissues and fascia. Hemostasis of the wound edge was achieved with the Bovie.  The rectus muscles were parted in the midline.  The peritoneum was entered and the urachus was identified.  A triangular wedge of peritoneum was excised along with the urachus.  The incision lines were along the course of the obliterated umbilical artery.  Once the peritoneum had been opened, and the peritoneal leaflets had been taken down, the retropubic space was entered and the bladder was dissected free from the right and left pelvic sidewalls.  Palpation in the right pelvic sidewall confirmed the presence of suspicious adenopathy.  At this point the bowel was packed superiorly and a Buchwalter retractor was used to provide exposure.  The peritoneum was then incised down across the cul-de-sac on each side and posterior dissection was then performed to create a plane between the rectum and the prostate seminal vesicles.  The right lateral pedicle was then taken down using the LigaSure.  The posterior pedicle was then taken down in a similar fashion.  The ureter was then identified during the dissection and was divided between clips and a small section was sent for frozen section.  This returned  benign.  Once the right pedicle had been taken down, the left pedicle was taken down in identical fashion.  Once again a small segment of ureter was removed and sent for frozen section analysis.  It returned benign.  Once the bladder had been dissected out to the prostatic apex, the puboprostatic ligaments were taken down.  The dorsal vein complex was isolated using the Hoenfelder clamp and was doubly ligated with 0 Vicryl ties.  The dorsal vein complex  was then divided, exposing the urethra. The anterior urethra was then opened with scissors.  The Foley catheter was identified, clamped, cut and drawn into the wound to provide cephalad traction.  The posterior urethral wall was then divided as well leaving an excellent urethral stump.  At this point, the bladder specimen was removed from the field.  A pack was placed in the pelvis as there was some mild oozing but no defined vessels bleeding.  A right iliac node dissection was then performed.  The limits of dissection were the external iliac vein, the obturator nerve, the circumflex iliac vein and, the bifurcation of the iliac artery.  The patient had very dense adenopathy in this area with significant inflammatory reaction.  It was quite adherent to both obturator nerve and the iliac vein.  Very careful dissection was performed to remove the node packet. However, I was unable to remove all abnormal-feeling tissue due to its presence adjacent to the nerve and the vein.  However, golf ball-sized node package was removed.  This was sent for frozen section.  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  At this point the right ureter was freed up for approximately 8 cm to allow for the eventual anastomosis.  The left ureter was dissected cephalad in a similar fashion.  At this point inspection of the pelvic floor was revealed some bleeding from the dorsal vein complex.  This was handled using a 2-0 Vicryl figure-of-eight suture ligature.  Once this hemostasis had been achieved, a pack was left in the pelvis.  Bertram Millard. Dahlstedt, M.D. then performed the ileoneobladder creation.  That will be dictated as a separate note.  After completion of the creation of the ileoneobladder and anastomosis to the urethral stump, a #10 Blake drain was placed through the right abdominal wall through a separate stab wound.  The rectus muscles were then reapproximated in the  midline using interrupted 2-0 chromic stitches.  The abdominal fascia was then closed using a running #1 Novofil.  The subcutaneous tissues were  irrigated.  The skin was then closed with clips.  The drain was secured to the skin with a silk suture and the drain was placed to bulb suction.  The Foley catheter and ureteral catheters were placed to straight drainage.  A dressing was applied to the abdominal wound.  The patients anesthetic was reversed. He was moved to the recovery room in stable condition.  There were no complications during the procedure.  The blood loss was approximately 600 cc. ictated by:   Excell Seltzer. Annabell Howells, M.D. Attending Physician:  Evlyn Clines DD:  11/20/00 TD:  11/20/00 Job: [id number]

## 2010-07-09 NOTE — H&P (Signed)
[facility]  Patient:    [name], [name] Visit Number: [id number] MRN: [id number]          Service Type: MED Location: 3W [id number] Attending Physician:  Liborio Nixon Dictated by:   Bertram Millard. Dahlstedt, M.D. Admit Date:  03/26/2001   CC:         Pierce Crane, M.D.   History and Physical  REASON FOR ADMISSION:  Fever, history of bladder cancer.  HISTORY OF PRESENT ILLNESS:  This nice 77 year old gentleman is admitted for high fever.  He has a history of bladder cancer and underwent radical cystoprostatectomy with an ileal neobladder.  That was performed in September 2002.  He did have preoperative findings of muscle-invasive bladder cancer and, during surgery, was found to have positive nodes.  He did well after surgery and was seen by Dr. Pierce Crane.  He has been receiving combination Gemzar/carboplatin chemotherapy.  He has tolerated this well.  He has been treated twice before for a Pseudomonas UTI.  He recently completed a course of Fortaz approximately 10 days ago.  He came to my office today complaining of high fever and just not feeling well.  It was obvious looking at the patient at that time that he needed to be admitted for further evaluation.  He did undergo a renal ultrasound in the office which revealed right hydronephrosis. There was no left hydronephrosis.  He did have approximately 400 cc in his bladder prior to voiding.  He had evidence of an infection on his urinalysis.  PAST MEDICAL HISTORY:  Hypertension.  PAST SURGICAL HISTORY:  Bilateral cataract extractions in 1992 and in 2001.  MEDICATIONS: 1. Lotensin 20 mg daily. 2. Hydrochlorothiazide 25 mg daily. 3. Macrobid 100 mg 1 p.o. b.i.d. 4. Ambien q.h.s. p.r.n. 5. Nausea medicine.  ALLERGIES:  SULFA, which causes nausea and generalized aching.  SOCIAL HISTORY:  The patient is married.  He has children.  He is a retired Warehouse manager.  He has a  60-pack-year history of cigarette smoking but stopped several years ago.  FAMILY HISTORY:  Noncontributory.  REVIEW OF SYSTEMS:  Noncontributory.  PHYSICAL EXAMINATION:  GENERAL:  Moderately ill-appearing adult male.  VITAL SIGNS:  Blood pressure 169/93, pulse 123, respirations 22, temperature 103.8.  HEENT:  Normal.  NECK:  Supple.  Without thyromegaly or adenopathy.  CHEST:  Few dry crackles in both lung bases; otherwise, unremarkable.  Normal inspiratory and expiratory excursion.  HEART:  Increased rate, normal rhythm.  ABDOMEN:  Protuberant, soft, nondistended, nontender.  No hepatosplenomegaly or masses.  Bowel sounds present.  No CVA tenderness was present.  GENITOURINARY:  Not examined.  EXTREMITIES:  Without cyanosis, clubbing, or edema.  ADMISSION LABORATORY DATA:  White count 6400, hematocrit 35.4%, platelet count 391,000.  Protime was normal, as was PTT, with INR 1.1.  BMET was normal except for a glucose of 140, BUN was 18, creatinine 1.1.  Bilirubin 1.4, SGOT 48, SGPT 46.  CMET was otherwise unremarkable.  IMPRESSION: 1. Right hydronephrosis on ultrasound. 2. Acute pyelonephritis. 3. Bladder carcinoma status post cystoprostatectomy and neobladder with    extravesicle/nodal disease, undergoing chemotherapy. 4. Hypertension.  PLAN: 1. Will draw blood cultures. 2. Will start the patient on Fortaz for suspected recurrent Pseudomonas UTI. 3. Will perform percutaneous nephrostomy in the morning.  Spoke with radiology    about this, and this has already been scheduled. 4. Spoke with patient and wife about long-term treatment.  Once he has cooled    down from his nephrostomy,  will try to have study done of right upper    tract to make sure it is not obstructed.  If there is a significant    obstruction, will need internalization of stent. Dictated by:   Bertram Millard. Dahlstedt, M.D. Attending Physician:  Liborio Nixon DD:  03/26/01 TD:   03/27/01 Job: [id number]

## 2010-07-09 NOTE — Op Note (Signed)
NAME:  GAL, FELDHAUS NO.:  [id number]   MEDICAL RECORD NO.:  [id number]                   PATIENT TYPE:  AMB   LOCATION:  DSC                                  FACILITY:  [facility]   PHYSICIAN:  Velora Heckler, M.D.                DATE OF BIRTH:  [birth year]   DATE OF PROCEDURE:  01/30/2003  DATE OF DISCHARGE:                                 OPERATIVE REPORT   PREOPERATIVE DIAGNOSIS:  Incisional hernia.   POSTOPERATIVE DIAGNOSIS:  Incisional hernia.   PROCEDURE:  Repair of incisional hernia with polyester mesh.   SURGEON:  Velora Heckler, M.D.   ANESTHESIA:  General.   ESTIMATED BLOOD LOSS:  Minimal.   PREPARATION:  Betadine.   COMPLICATIONS:  None.   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  He had undergone a  cystectomy for bladder cancer in September 2002.  He noted a bulge at the  level of the umbilicus at the superior aspect of his abdominal incision over  the past few months.  He now comes to surgery for repair.   DESCRIPTION OF PROCEDURE:  The procedure was done in OR  #4 at the [facility].  The patient was brought to the operating room and placed in  a supine position on the operating room table.  Following the administration  of general anesthesia the patient was prepped and draped in the usual strict  aseptic fashion.  After ascertaining an adequate level of anesthesia had  been obtained, an infraumbilical incision was made transversely with a #15  blade.  The skin was anesthetized with local anesthetic.  Dissection was  carried down through subcutaneous tissues to the fascia.  The hernia defect  is defined.  It contains omentum.  The fascial edges are developed  circumferentially for approximately 2 to 3 cm.  No other hernia defect is  identified.  The defect is closed with interrupted 0 Ethibond sutures.  A  sheet of polyester mesh  is cut to the appropriate dimensions and placed as  an on-lay patch over the repair.  It is secured circumferentially with  interrupted 0 Ethibond sutures.  Good hemostasis is noted.  The umbilicus is  reaffixed to the abdominal wall with an interrupted 0 Novafil suture.  The  subcutaneous tissues were closed with interrupted 3-0 Vicryl sutures.  The  skin was closed with interrupted 4-0 Vicryl  subcuticular sutures.  The wound is washed and dried and Benzoin and Steri-  Strips were applied. Sterile gauze dressings are applied.  The patient is  awakened from anesthesia and brought to the recovery room in stable  condition.  The patient tolerated the procedure well.  Velora Heckler, M.D.    TMG/MEDQ  D:  01/30/2003  T:  01/30/2003  Job:  [id number]   cc:   Bertram Millard. Dahlstedt, M.D.  [address]  [place]  [place] [address]  Fax: [phone]   Excell Seltzer. Annabell Howells, M.D.  [address]  [place]  [place] [address]  Fax: [phone]   Angelena Sole, M.D. [facility]

## 2010-07-09 NOTE — Op Note (Signed)
TNAMENERI, SAMEK NO.:  [id number]   MEDICAL RECORD NO.:  [id number]                    PATIENT TYPE:   LOCATION:                                       FACILITY:   PHYSICIAN:  Excell Seltzer. Annabell Howells, M.D.                 DATE OF BIRTH:   DATE OF PROCEDURE:  09/27/2001  DATE OF DISCHARGE:                                 OPERATIVE REPORT   PROCEDURE:  1. Cystoscopy.  2. Removal of right ureteral stent.  3. Right retrograde pyelogram with interpretation.   PREOPERATIVE DIAGNOSES:  History of right ureteral stricture with retained  stent and recurrent urinary tract infections.  The patient with a  neobladder.   POSTOPERATIVE DIAGNOSES:  History of right ureteral stricture with retained  stent and recurrent urinary tract infections.  The patient with a  neobladder.   SURGEON:  Excell Seltzer. Annabell Howells, M.D.   ANESTHESIA:  General.   COMPLICATIONS:  None.   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  He developed a right ureteral stricture postoperatively and  has had a stent in now for several months.  He has had recurrent pseudomonal  UTIs, and we are now to remove the stent to see if that will reduce the  infection and bleeding.   FINDINGS AT PROCEDURE:  The patient was given 120 mg of tobramycin IV  preoperatively according to sensitivities.  He as then taken to the  operating room where a general anesthetic was induced.  He was placed in  lithotomy position.  His perineum and genitalia were prepped with Betadine  solution.  He was draped in the usual sterile fashion.  Cystoscopy was  performed using a 22 [place] scope and a 12 and 70 degree lenses.  Examination revealed a normal urethra.  The external sphincter was intact,  and the neobladder had some erythema of the  wall, consistent with  inflammation, but no obvious tumors were noted.  The stent was noted exiting  the ureteral meatus off the posterior wall of the neobladder.  There was a  fair amount of mucus adherent to the stent.  The stent was grasped and was  removed.  An attempt was made to place a wire through the stent, but this  was unsuccessful due to limited length.  I then re-cystoscoped the patient  and found the ureteral orifice.  I was able to pass a guidewire up the  orifice.  A 5 French open-ended catheter was then placed over the guidewire.  The guidewire was removed.  Contrast was instilled in a retrograde fashion.  This demonstrated a delicate duplicated right upper tract with no obvious  evidence of residual stricture.  The distal ureter appeared to drain upon  removal of the catheter.  At this  point, the bladder was drained.  The patient was taken down from lithotomy  position.  His anesthetic was reversed.  He was moved to the recovery room  in stable condition.  There were no complications.  He will return to my  office tomorrow for a second dose of tobramycin and a renal ultrasound.                                               Excell Seltzer. Annabell Howells, M.D.    JJW/MEDQ  D:  09/27/2001  T:  09/30/2001  Job:  [id number]   cc:   Bertram Millard. Dahlstedt, M.D.   Pierce Crane, M.D.  [address] [facility]  [place]  [place] [address]  Fax: [phone]   Heather Roberts, M.D.

## 2010-07-09 NOTE — Discharge Summary (Signed)
[facility]  Patient:    [name], [name] Visit Number: [id number] MRN: [id number]          Service Type: MED Location: [id number] [phone] Attending Physician:  Liborio Nixon Dictated by:   Bertram Millard. Dahlstedt, M.D. Admit Date:  03/26/2001 Discharge Date: 03/29/2001                             Discharge Summary  DIAGNOSES: 1. Bladder cancer, metastatic. 2. Right pyelonephritis. 3. Hydronephrosis. 4. Septicemia. 5. Hypertension.  PRINCIPAL PROCEDURE:  Percutaneous nephrostomy tube on right.  HISTORY OF PRESENT ILLNESS:  The patient is a 77 year old male status post a cystectomy and ______ bladder within the past year.  He has been treated for recurrent urinary tract infections with outpatient IV antibiotics.  He has grown a Pseudomonas.  Two days before this admission he began having significant symptomatology with high fever and general lassitude.  He was seen in my office, and at that time it was recommended that he come into the hospital.  He has been found to have hydronephrosis, and it was recommended to have a nephrostomy tube placed.  HOSPITAL COURSE:  The patient was admitted to my service.  He underwent nephrostomy tube placement on the day of admission.  His culture grew Pseudomonas.  This was from the blood.  At the time of dictation, the sensitivities had not returned.  However, he was started on dual IV antibiotic management.  His white count was normal at 6400 the day of admission.  It did not change substantially.  With nephrostomy tube placement, the patient rapidly defervesced.  He is still somewhat easily fatigued.  By the third hospital day he was felt to have reached maximal hospital benefit.  He learned nephrostomy tube management.  CT scan showed a cyst in the upper pole of the right side of the horseshoe kidney.  There was no evidence of metastatic disease in the pelvis or the abdomen.  He was discharged home with  IV antibiotics, specifically Elita Quick, to be administered by home health care for 8 days.  FOLLOWUP:  In my office.  We will eventually need to internalize this stent into his right collecting system.  CONDITION ON DISCHARGE:  Improved. Dictated by:   Bertram Millard. Dahlstedt, M.D. Attending Physician:  Liborio Nixon DD:  04/11/01 TD:  04/11/01 Job: [id number]

## 2010-07-09 NOTE — Discharge Summary (Signed)
[facility]  Patient:    [name], [name] Visit Number: [id number] MRN: [id number]          Service Type: MED Location: [phone] Attending Physician:  Pierce Crane Dictated by:   Pierce Crane, M.D. Admit Date:  01/26/2001 Discharge Date: 01/29/2001   CC:         Bertram Millard. Dahlstedt, M.D.   Discharge Summary  DISCHARGE DIAGNOSES: 1. Bacteriuria with possible bacteremia. 2. History of bladder cancer, status post prostatocystotectomy and on current    adjuvant chemotherapy.  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  At the time of surgery also had Gleason VI adenocarcinoma of the prostate.  The patient has been receiving chemotherapy in a pseudoadjuvent fashion.  He has been receiving gemcitabine carboplatin chemotherapy and has had some episodes in the past of lower abdominal cramping and has been on Levaquin which he had completed a few days before admission.  He presented with a temperature of 100.4 a few days before admission and the day of admission had temperature of greater than 101.5 with chills and fevers.  He was evaluated in the emergency room.  He was subsequently admitted to the hospital for IV antibiotics and further management.  Past medical history is unremarkable.  On admission temperature was 101.3, blood pressure 140/106, pulse 130.  Head and neck exam was unremarkable. Lungs were clear.  Cardiac exam was unremarkable.  Abdominal exam showed no masses.  Extremities showed no clubbing or edema.  CBC showed white count was 6, hemoglobin 12.8, platelet count 332,000. Urinalysis showed cloudy urine with positive nitrates and with many bacteria.  HOSPITAL COURSE:  The patient was placed on IV Fortaz.  He defervesced.  He did well in the hospital with no other  complaints.  Blood cultures at the time of admission were still negative at the time of discharge.  He remained afebrile in the hospital.  Vital signs were stable.  On day of discharge white count was 5.4, hemoglobin 11.6, platelet count 391.  Creatinine was 0.9. Potassium was 4.1, sodium 138.  He was discharged with his home medications and also Cipro 500 mg b.i.d. was added for an additional ten days.  He was due to return to the office on February 02, 2001 for further chemotherapy.  I have asked him to call should he have any problems with fevers.  I suspect the etiology of his fever is related to his self catheterization and possible brief episode of bacteremia. ictated by:   Pierce Crane, M.D. Attending Physician:  Pierce Crane DD:  01/29/01 TD:  01/29/01 Job: [id number]

## 2010-07-09 NOTE — Op Note (Signed)
[facility]  Patient:    [name], [name] Visit Number: [id number] MRN: [id number]          Service Type: SUR Location: [phone] Attending Physician:  Evlyn Clines Proc. Date: 11/20/00 Admit Date:  11/20/2000   CC:         Excell Seltzer. Annabell Howells, M.D.   Operative Report  PREOPERATIVE DIAGNOSIS:  Transitional cell carcinoma of the bladder, invasive.  POSTOPERATIVE DIAGNOSIS:  Transitional cell carcinoma of the bladder, invasive.  PRINCIPAL PROCEDURE:  Creation of ileal neobladder.  CO-SURGEONS: 1. Bertram Millard. Dahlstedt, M.D. 2. Excell Seltzer. Annabell Howells, M.D.  ANESTHESIA:  General endotracheal.  COMPLICATIONS:  None.  ESTIMATED BLOOD LOSS:  Less than 1000 cc.  BRIEF HISTORY:  A 77 year old male with invasive transitional cell carcinoma of the bladder.  For a full history, please see admission note and Dr. Aaron Edelman operative note.  He presents at this time for ileal neobladder.  DESCRIPTION OF PROCEDURE:  After completion of the cystectomy, bilateral pelvic lymph node dissection, the ileal neobladder procedure commenced.  A 60 cm segment of distal ileum was measured and isolated using the GIA stapling device.  Ileal ileostomy was then performed in a side-to-side functional end-to-end fashion using the GIA and TA staplers.  The mesenteric window was closed using 3-0 silk popoffs, having allowed the neobladder ileal segment to fall inferior to the anastomosis.  The mesentery had been divided with the Ligasure.  The antimesenteric border of the 60 cm segment was opened with the stapled ends excised.  Fifteen centimeters from the distal end of the ileal segment, a neourethra was performed with two separate suture lines of 2-0 PDS, enough to allow a 28 [employer] sound through the neourethra.  The proximal end of the ileal segment was then coiled around and sewn to the vertex of the posterior neourethra.  The two suture lines formed were  closed using 2-0 PDS in a simple running fashion.  This formed a "posterior plate" of the neobladder.  The ureters were identified, and dissected superiorly. Careful inspection of the right ureter revealed just one distal ureter, not two.  The patient does have a duplicated upper ureter.  The ureters were brought through the posterior plate in the stab fashion, spatulated, and sewn to the posterior ileal segments using running 4-0 PDS.  Stents were placed through the ureters and sutured to the posterior aspect of the plate using 4-0 chromics.  The stents were brought through the neourethra and then passed through the urethra.  The 24 French Rusch catheter was brought through the urethra and the neourethra.  The neobladder was then closed in two running lines of 2-0 PDS.  Irrigation of the neobladder revealed it to be watertight. No leakage was seen.  At this point, four separate anastomotic sutures were placed in quadrant fashion through the urethra, urethral stump, and then the neourethra. The neourethra was brought down, and the sutures were tied and cut.  Irrigation of the neobladder revealed no evident leakage.  Gentle traction was placed on the catheter.  The pelvis was irrigated.  A 10 flat fully-fluted Blake drain was placed in the pelvis through a separate stab incision in the right lower quadrant and sutured to the skin with silk. Rectus muscles were reapproximated in the midline with chromic sutures.  The fascia was run using a running simply placed Novofil.  Skin edges were reapproximated using staples.  Dry sterile dressing was placed.  The stents were taped to  the Rusch catheter and hooked to drainage using a Goldberg catheter adaptor.  The patient tolerated the procedure well.  Estimated blood loss was less than 1000 cc.  He was transferred to the PACU in stable condition. Attending Physician:  Evlyn Clines DD:  11/20/00 TD:  11/20/00 Job: [id number]

## 2010-07-09 NOTE — Discharge Summary (Signed)
NAME:  [name], [name] NO.:  [id number]   MEDICAL RECORD NO.:  [id number]          PATIENT TYPE:  INP   LOCATION:  2631                         FACILITY:  [facility]   PHYSICIAN:  Kari Baars, M.D.  DATE OF BIRTH:  Jun 29, 1930   DATE OF ADMISSION:  04/12/2005  DATE OF DISCHARGE:  04/14/2005                                 DISCHARGE SUMMARY   DISCHARGE DIAGNOSES:  1.  Altered mental status, likely secondary to acute infection.  2.  Urinary tract infection.  3.  Abdominal wall hematoma status post umbilical hernia repair (March 25, 2005).  4.  Bladder cancer status post resection and chemotherapy with a neobladder.  5.  Recurrent urinary tract infections on Macrodantin for suppression.  6.  Paroxysmal atrial fibrillation on chronic Coumadin.  7.  Restless leg syndrome.  8.  Hypertension.  9.  Depression.  10. Hyperlipidemia.  11. Insomnia.  12. Diverticulosis.  13. Horseshoe kidney.  14. Status post hernia repair.  15. Asymptomatic right kidney stone.  16. Status post cataract surgery.   DISCHARGE MEDICATIONS:  1.  Augmentin XR 1000 milligrams 2 p.o. b.i.d. for 7 days.  2.  Coumadin 5 milligrams daily except for 2.5 milligrams on Friday.  3.  Restoril 15 milligrams daily.  4.  Macrodantin 100 milligrams q.h.s. to restart after completing Augmentin.  5.  Vicodin as needed for pain.  6.  Multivitamin.  7.  Vitamin E.  8.  Vitamin C.  9.  Toprol XL 100 milligrams daily.   HOSPITAL PROCEDURES:  CT of the head without contrast (April 12, 2005)  stable atrophy and chronic microvascular ischemic changes with no acute  intracranial abnormalities.   HISTORY OF PRESENT ILLNESS:  For full details, please see dictated history  and physical by [name]. Briefly, [name] is a 77 year old white  male with a history of paroxysmal atrial fibrillation on chronic  anticoagulation, hypertension, hyperlipidemia, bladder cancer status post  resection/chemotherapy and formation of neobladder complicated by recurrent  urinary tract infections, who was referred to the emergency department on  April 12, 2005 by [name] due to altered mental status. The patient  states that she was experiencing hours of confusion, staggering gait,  lethargy, decrease in appetite and nausea. He may have had some slurred  speech as well but denied any other focal neurologic symptoms. He saw Dr.  Gerrit Hall for follow-up of his abdominal umbilical hernia repair. He was found  to have a postoperative hematoma. He did not feel that this was the source  of infection. Given the patient's altered mental status, he was referred to  the emergency department where head CT was performed and showed no evidence  of intracranial abnormality. Urinalysis was significant for 21-50 white  blood cells. White blood count was 18,000. Given these changes in laboratory  data values, the patient was admitted for further management.   HOSPITAL COURSE:  The patient was admitted to a telemetry bed. Cardiac  enzymes were obtained and were negative. He was placed on heparin drip due  to his chronic anticoagulation. He was treated with Zosyn  to treat both  urinary tract infection and pocket hematoma. He was gently hydrated. With  these treatments, the patient's mental status rapidly returned to normal  following admission and after receiving one dose of antibiotics. His  abdominal wound was evaluated by [name] who recommended monitoring and  no other changes or surgical intervention. On the morning of discharge, the  abdominal wound is less erythematous. At this time, urine and blood cultures  are pending but he seems to have improved with empiric Zosyn.   DISCHARGE INSTRUCTIONS:  The patient was instructed to complete his  antibiotics. He can resume his Coumadin and should have an INR checked with  [name] in two weeks. He should call if he has a fever of more than  101.5,  recurrent confusion, nausea/vomiting, or increased drainage from his wound.   HOSPITAL FOLLOW-UP:  He will follow up with [name] in one week. He should  follow-up with [name] as directed.      Kari Baars, M.D.  Electronically Signed     WS/MEDQ  D:  04/14/2005  T:  04/14/2005  Job:  [id number]   cc:   Dennis Heckler, MD  [address] [place]  [place] [address]

## 2010-07-09 NOTE — H&P (Signed)
[facility]  Patient:    [name], [name] Visit Number: [id number] MRN: [id number]          Service Type: MED Location: [phone] Attending Physician:  Pierce Crane Dictated by:   Lynett Fish, M.D. Admit Date:  01/26/2001 Discharge Date: 01/29/2001   CC:         Pierce Crane, M.D.  Bertram Millard. Dahlstedt, M.D.  Excell Seltzer. Annabell Howells, M.D.  Heather Roberts, M.D.   History and Physical  HISTORY OF PRESENT ILLNESS:  [name] is a 77 year old gentleman who is admitted to the [facility] with shaking chills and a fever of 102 at home, 101.6 in the emergency room.  [name] was really well, had no significant problems until August 2002, when he developed hematuria.  He had a transurethral bladder resection at time of cystoscopy and was also found to have a Gleason 6 adenocarcinoma of the prostate gland.  In late September, the patient was admitted and had a radical cystectomy with a neoadjuvant bladder reconstruction through the urethra.  At that time he was found to have multiple matted nodes in his right internal iliac lymph node chain, and they were resected.  It was impossible to remove all of the lymph nodes, however, because of the close approximation of some palpable disease near the obturator nerve and blood vessels.  The patient also had a radical prostatectomy at that time.  He was seen in consultation by Dr. Pierce Crane, who has started him on six months of chemotherapy with day #1, day #8 Gemzar and carboplatin.  He has currently completed two cycles of chemotherapy and was due to have his next treatment this week.  About two weeks ago the patient saw Dr. Retta Diones with urinary tract infection and was treated with 10 days of Levaquin at a dose of 500 mg a day.  He has been off that for four days, and about two days ago he again became febrile to 100.4.  Today he became febrile to a higher degree. He has not complained  of any dysuria.  He has had no flank pain.  He has had no sore throat, no sinusitis, no cough.  He has had no change in his bowel habits.  The patient came to the emergency room and, as I said, he was febrile to 101.6 degrees, and he is admitted for pan-cultures and intravenous fluids with antibiotics.  PAST MEDICAL HISTORY, SOCIAL HISTORY:  The patient is 77 years old.  He is retired.  He has had the usual childhood illnesses.  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  No TB or rheumatic fever.  ALLERGIES:  He has no allergies.  He is intolerant of SULFA.  PAST SURGICAL HISTORY: 1. T&A age 70. 2. Ganglion cysts removed from left hand and, I believe, right foot. 3. Bilateral cataract surgeries.  TOBACCO/ALCOHOL:  He began smoking as a late teen and smoked two packs a day probably from his early 43s until early 45s when he and his wife both quit smoking.  Heavy alcohol consumption prior to that, and he, essentially, discontinued alcohol, except for an occasional glass of wine, in the past 15 years.  FAMILY HISTORY:  Paternal grandmother had rectal cancer.  No other members with cancer.  REVIEW OF SYSTEMS:  The patient has had occasional headaches, but for the past month he has also noted some  instability of gait, and he has been bumping into walls at home occasionally.  He has a long history of an occasional irregular heartbeat and some hypertension for 30 years, on Lotensin and hydrochlorothiazide for that.  No history of reflux, gastric or duodenal ulcers, hepatitis.  He has had regular bowel movements.  No significant pain.  PHYSICAL EXAMINATION:  GENERAL:  Looks younger than his 8 years.  VITAL SIGNS:  He is febrile to 101.6, pulse 130, respirations 18, blood pressure 143/106.  HEENT:  Funduscopic exam benign.  Oropharynx clear.  There are no exudates. Dentition is good.  NECK:   Supple.  No thyromegaly.  No lymphadenopathy in the cervical, supraclavicular, axillary, or inguinal areas.  LUNGS:  Clear to P&A.  HEART:  Occasional premature beat.  No murmurs or gallops.  ABDOMEN:  Soft, nontender.  No organomegaly or masses.  He does not have any flank tenderness.  RECTAL:  I did not do a rectal exam.  EXTREMITIES:  No peripheral edema.  NEUROLOGIC:  Cranial nerves II-XII intact.  Muscle strength 4/5.  Deep tendon reflexes are 2+.  Fine cerebellar function, finger-to-nose, thumb-to-fingers is all good.  I did not stand the patient up and do midline cerebellar dysfunction studies.  IMPRESSION AND PLAN:  Fever.  I suspect urinary tract.  The patient does catheterize himself daily.  He has had a urinary tract infection two weeks ago, and I am worried about the persistence of this, given 10 days of antibiotics.  Will do a urinalysis, C&S.  We have done blood cultures, and I have started him on Fortaz 2 g IV q.8h.  The patient does have probably persistent disease in the right inguinal iliac area.  He may need to have some radiation therapy to this area in combination with his chemotherapy at some later time.  He has some CNS dysfunction that is new over the past month, and we may need to do an MRI scan to evaluate that.  The patient does have a Living Will, and he does not wish to have DNR should those events arise; but I do not think that that will really be a significant problem during this hospitalization. Dictated by:   Lynett Fish, M.D. Attending Physician:  Pierce Crane DD:  01/26/01 TD:  01/27/01 Job: [id number]

## 2010-07-09 NOTE — H&P (Signed)
NAME:  [name], [name] NO.:  [id number]   MEDICAL RECORD NO.:  [id number]                   PATIENT TYPE:   LOCATION:                                       FACILITY:   PHYSICIAN:  [name]. [name], M.D.                 DATE OF BIRTH:   DATE OF ADMISSION:  08/19/2003  DATE OF DISCHARGE:                                HISTORY & PHYSICAL   IDENTIFYING INFORMATION:  A patient of [name], M.D.   CHIEF COMPLAINT:  Fever.   HISTORY:  [name] is a 77 year old white male with a history of  bladder cancer that was treated with cystectomy, node dissection, and ileal  neobladder in September of 2002.  He was found to have positive nodes at  that time and was given a course of chemotherapy which he completed in April  of 2003.  He has had problems with recurring urinary infections and was  recently seen in our office where he was noted to have hematuria and was  treated with doxycycline.  A recent urine culture grew a Pseudomonas with  sensitivities only to tobramycin and he was scheduled to return to see me  today for a discussion of treatment of that particular issue.  However, on  Friday he began to have fever and chills with moderate back pain and malaise  with anorexia.  He denies recurrent hematuria and feels is emptying okay but  feels quite ill.   PAST HISTORY:  Pertinent for ALLERGY to SULFA.   CURRENT MEDICATIONS:  Include Lotensin, hydrochlorothiazide, Ditropan,  Toprol, and Coumadin.   MEDICAL HISTORY:  Pertinet for atrial fibrillation, hypertension, history of  bladder cancer with positive nodes, and an incidental finding of prostate  cancer at the time of his cystectomy.   SURGICAL HISTORY:  Significant for the cystectomy and neobladder as noted  above.  He had repair of a ventral hernia in December of 2004 by Dr. Gerrit Hall.  He has had bilateral cataract extractions.   SOCIAL HISTORY:  He is a former smoker and drinks moderate  alcohol.   FAMILY HISTORY:  Pertinent for strokes.   REVIEW OF SYSTEMS:  He denies chest pain.  He has no shortness of breath  except when he plays tennis.  He denies any GI complaints.  He reports a  good urine output but has had some frequency.  He only catheterizes himself  occasionally.  He has had some back pain that has been primarily on the  right.  He has been quite anorexic with weakness, malaise, and confusion.  He is otherwise entirely without complaints.   PHYSICAL EXAMINATION:  VITAL SIGNS:  Blood pressure 146/97, pulse 120, temp  101.1.  GENERAL:  He is a well-developed, well-nourished, weak, somewhat confused  white male who is otherwise alert to place, person, and time.  HEAD AND FACE:  Normocephalic, atraumatic.  NECK:  Supple without mass.  LYMPHATICS:  He has no cervical, axillary, or inguinal adenopathy.  LUNGS:  Clear with normal effort.  HEART:  Irregular rate and rhythm.  ABDOMEN:  Soft, flat, without tenderness.  Well-healed surgical scars.  No  mass, hepatosplenomegaly, or hernias noted.  GENITOURINARY:  An unremarkable phallus with an adequate meatus.  Scrotum is  unremarkable.  Testicles bilaterally descended, normal in size and  consistency without mass or tenderness.  Epididymides are unremarkable.  Anus and perineum without lesions.  RECTAL:  Normal sphincter tone.  No rectal masses are noted.  Prostate:  Seminal vesicles are absent.  EXTREMITIES:  Have full range of motion without edema but he is quite shaky  when he try's to stand.  He has no focal deficits on neurologic evaluation.  Skin is warm and dry.   Urine in the office had 15-50 white cells, 15-50 red cells.  Today is noted  above.  His recent culture grew Pseudomonas sensitive only to tobramycin.   A CT urogram was obtained in the office to rule out hydronephrosis or a leak  from his neobladder.  This study demonstrated a horseshoe kidney with a  small stone in the right lower pole but no  hydronephrosis.  There is a cyst  on the right as well.  The ureters were unremarkable.  The neobladder  appears to be only partially full.  There are no perivesical fluid  collections noted.   IMPRESSION:  1. Urosepsis.  2. History of bladder cancer with positive nodes status post cystectomy with     ileal neobladder creation and chemotherapy.  3. Incidental prostate cancer.  4. Atrial fibrillation on Coumadin.   PLAN:  He is going to be admitted to the hospital for IV hydration and  intravenous tobramycin.  A repeat urine culture will be obtained today in  the office.  Blood cultures will be obtained in the hospital along with the  routine blood work.                                               [name]. [name], M.D.    JJW/MEDQ  D:  08/19/2003  T:  08/19/2003  Job:  [id number]   cc:   [name], M.D. [facility]   [name], M.D.  [address] [facility]  [place]  [place] [address]  Fax: [phone]   [name], M.D.

## 2010-07-09 NOTE — H&P (Signed)
NAME:  [name], [name] NO.:  [id number]   MEDICAL RECORD NO.:  [id number]          PATIENT TYPE:  EMS   LOCATION:  [facility]                         FACILITY:  [facility]   PHYSICIAN:  Mark A. Perini, M.D.   DATE OF BIRTH:  1931/01/31   DATE OF ADMISSION:  04/12/2005  DATE OF DISCHARGE:                                HISTORY & PHYSICAL   CHIEF COMPLAINT:  Unsteady gait, slurred speech, temperature, malaise.   HISTORY OF PRESENT ILLNESS:  [name] is a 77 year old Caucasian male with a past  history of paroxysmal atrial fibrillation on chronic anticoagulation  therapy, hypertension, restless leg syndrome, hyperlipidemia and bladder  cancer status post resection and chemotherapy and formation of a neobladder.  He underwent an umbilical hernia repair on March 25, 2005.  He did have  some discomfort in the area, and 2 days ago noted some blood coming out of  his naval.  He packed it and was seen by the surgeon today.  Coincidentally,  he had a CT scan of his abdomen and pelvis done on April 06, 2005 which  was ordered by his oncologist.  This showed an 11 x 11 cm fluid collection  at the site of the umbilical area.  Today, in the surgeon's office, he  apparently had a significant amount of blood and fluid products removed,  aspirated from the area.  In the last 24 hours, he has felt sick with low-  grade temperatures, lethargy, feeling listless, and he has had some unsteady  gait and nausea.  He may have had some slurred speech, as well, but he has  no focal weakness or other neurologic symptoms.  He was evaluated in the  emergency room and will require admission for further evaluation and  treatment.   PAST MEDICAL HISTORY:  1.  Atrial fibrillation, currently in normal sinus rhythm.  He states it has      been over a year since his last episode of atrial fibrillation.  2.  Restless leg syndrome.  3.  Hypertension.  4.  Chronic anticoagulation.  5.  Depression.  6.   Bladder cancer in September of 2002, status post resection,      chemotherapy, and he did have a neobladder made.  7.  Hyperlipidemia.  8.  Insomnia.  9.  Diverticulosis.  10. Horseshoe kidney.  11. Status post hernia repair.  12. Cataract surgery.  13. He does have a 7 mm lower pole on the right kidney stone noted on his      recent CT scan, which has been asymptomatic.   ALLERGIES:  SULFA.   MEDICATIONS:  1.  Toprol-XL 100 mg daily.  2.  Coumadin 5 mg daily, except 2.5 mg each Friday.  3.  Keflex 500 mg each evening.  4.  Macrodantin 100 mg daily.  5.  Restoril 15 mg each evening.  6.  Vicodin 5/500 as needed.  He has taken 3 in the last 24 hours.  7.  Multivitamin daily.  8.  Vitamin C.  9.  Vitamin E.  10. He has been taking an 81 mg aspirin daily along with his  Coumadin for a      number of years.  11. He states that he has been on an anti-nausea medicine, which he cannot      identify at this time.  (Valium and Ativan are listed on his medication list, but he states that he  is not taking these.  He had been on Zoloft, but he has been off of this  lately.)   SOCIAL HISTORY:  He moved to [place] in 1994.  He is married and has 3  children, 5 grandchildren and 1 great-grandchild.  He is a retired Location manager.  No drug use.  He quit alcohol in 1990.  He had a 100  pack year smoking history, but quit in 1985.   FAMILY HISTORY:  Father died of a stroke in his 74s.  Mother died of heart  failure in her 76s.   REVIEW OF SYSTEMS:  The patient went off his Coumadin for 7 days prior this  umbilical surgery and started it the day of surgery.  He held his aspirin  during this period, as well.  He had felt bad over the last 24 hours.  He  has had some nausea without vomiting.  He felt a little bit lightheaded and  unsteady.  He has no focal weakness of his upper or lower extremities.  He  has had no alcohol use recently.  He had a temperature of 100.1 at home.   He  denies any chest pains or shortness of breath.  His family thinks that he  may have had a little slurred speech, but he denies any word-finding  problems or visual disturbances.  He wears a Cunningham clamp at night for  urinary incontinence nocturnally.   PHYSICAL EXAMINATION:  VITAL SIGNS:  Temperature 99.1, blood pressure  138/98, pulse 104, respiratory rate 20, oxygen saturation 92% on room air.  GENERAL:  He is an age-appropriate white male in no acute distress.  He is  alert and oriented and is a fairly good historian.  NEUROLOGIC:  His left mouth appears to droop; however, there is no weakness  detected of any of his cranial nerves, and his wife says that the appearance  of his mouth is completely normal for him.  Cranial nerves II-XII are  normal.  There is no diplopia.  Extraocular movements are intact.  There is  normal strength of the upper and lower extremities bilaterally.  There are  2+ DTRs throughout with downgoing toes bilaterally.  LUNGS:  Clear to auscultation bilaterally with no wheezing, rales, or  rhonchi.  HEART:  Regular rate and rhythm with no murmur, rub, or gallop.  ABDOMEN:  Notable for a puncture wound near the site of his umbilical scar.  The area is red and warm and is somewhat indurated.  No fluid is expressed  from the area.  It is mildly tender.  He has some yeast in his bilateral  groins.  He has 2+ pulses throughout.  No bruits noted.   LABORATORY DATA:  EKG shows normal sinus rhythm, left ventricular  hypertrophy.  No definite anterior Q wave, but he does have apparently an  old inferior mi by EKG with no acute ST-T changes.  Chest x-ray shows no  active disease with minimal bibasilar atelectasis, and no change in the left  upper lobe nodule.  CT scan of the head today shows atrophy and chronic  microvascular white matter disease, but is stable from an MRI of February  2006.  The CT from April 06, 2005 of the abdomen and pelvis showed  stable left upper lobe nodule of the lung.  No metastatic disease.  He has a fluid  collection of 11 x 11 cm near his umbilicus with some air noted, felt to be  abscess or blood.  He has a horseshoe kidney in the right lower pole renal  calculus that is 7 mm.  Urinalysis shows 21-50 white cells, 3-6 red cells,  rare bacteria.  Dip stick shows moderate leukocytes, negative nitrite,  moderate blood and some mucous is noted on the microscopic.  White count of  18,000.  Differential is pending.  Hemoglobin 16.1, platelet count 272,000.  PTT is 45, INR 1.5.  CMET shows a sodium of 135, potassium 4.2, chloride  102, CO2 of 23, BUN 13, creatinine 1.2, glucose 126.  Total bilirubin 1.8.  Alkaline phosphatase 72, AST 21, ALT 17, total protein 6.9, albumin 3.8,  calcium 9.4.  His INR today is 1.5; yesterday, it was 2.3, and 4 days ago it  was 2.4.   ASSESSMENT AND PLAN:  A 77 year old male with elevated temperature and  leukocytosis with a fluid collection at the site of his recent surgery.  He  had some fluid evacuated from this earlier today.  I believe that he does  have some possible infection at this site, and this could be his main  problem causing all of his symptoms, including his lethargy and elevated  white count and temperature.  However, he could have a urinary tract  infection.  Furthermore, a stroke is possible.  However, he has no clear  neurological signs on exam, and the elevated white count and appearance of  his wound and abdomen would make me favor this as the site of his current  problem.  We will admit him.  We will check an EKG, which does confirm  normal sinus rhythm.  Check another EKG in the morning.  Will place him in a  telemetry bed.  Will check a set of cardiac enzymes.  Will check blood  cultures x2 and start empiric Zosyn, which should cover an abdominal or  urinary pathogen.  Will do neurologic checks every shift, and if he has any  changes, we will consider an MRI  scan.  I would like to have surgery see the  patient, as he may need further drainage.  Furthermore, we will hold aspirin  and a heparin drip at this time.  He does have a subtherapeutic INR, but he  is in normal sinus rhythm with no recent reported episodes of atrial  fibrillation.  He may need drainage or other procedures, and he has had a  large hematoma.  Therefore, I would like to hold his anticoagulation  temporarily.  Of note, this patient may have underlying coronary disease  that is subclinical, as noted by his long smoking history, his dyslipidemia  noted from his office chart, and the fact that he had a greater than 100  pack year smoking history prior.  He is full code.           ______________________________  Redge Gainer. Waynard Edwards, M.D.     MAP/MEDQ  D:  04/12/2005  T:  04/12/2005  Job:  [id number]   cc:   Velora Heckler, MD  [address]  [place]  [place] [address]   Jesse Sans. Wall, M.D.  [address]  [address]  [place]  [place] [address]   Kari Baars, M.D.  Fax: [phone]

## 2010-07-09 NOTE — Discharge Summary (Signed)
[facility]  Patient:    [name], [name] Visit Number: [id number] MRN: [id number]          Service Type: SUR Location: [id number] Attending Physician:  Evlyn Clines Admit Date:  11/20/2000 Discharge Date: 11/27/2000   CC:         [name], M.D.  Dr. Donnie Coffin  Dr. Orson Aloe   Discharge Summary  HISTORY:  [name] is a 77 year old white male who was initially referred for hematuria.  He was found to have a tumor on the posterior wall of the bladder and underwent resection.  This demonstrated a deeply invasive, poorly differentiated, transitional cell carcinoma.  A CT scan was performed which suggested lymphadenopathy in the right iliac fossa, however, it was not amenable to biopsy.  After discussing treatment options, he has elected to undergo radical cystoprostatectomy with node dissection and neobladder. Dr. Retta Diones will be performing the neobladder in conjunction with the cystectomy.  ALLERGIES:  On review of his past history, he has ALLERGIES to SULFA drugs.  MEDICATIONS:  Initially included aspirin 1/2 tablet daily, Lotensin 20 mg daily, hydrochlorothiazide 25 mg daily.  PAST MEDICAL HISTORY:  The medical history is pertinent for hypertension.  PAST SURGICAL HISTORY:  Pertinent for bilateral cataract extractions in 1992 and 2001.  SOCIAL HISTORY:  He was a two-pack a day smoker for 30 years.  He quit in 1985.  He drinks two glasses of wine a week.  He is a retired vice principal.  FAMILY HISTORY:  Pertinent for hypertension, kidney stones.  REVIEW OF SYSTEMS:  He has had a history of an irregular heartbeat intermittently over the past 20 years, some mild obstruction voiding complaints, frequency.  He has had the hematuria recently as well.  He has also had some low back pain and right testicular pain, but is otherwise without complaints.  PHYSICAL EXAMINATION:  VITAL SIGNS:  His blood pressure is  174/98, heart rate 82, temperature 98.4.  GENERAL:  He is a well-developed, well-nourished, white male in no acute distress, alert and oriented x3.  HEENT:  Normocephalic, atraumatic.  NECK:  Supple without thyromegaly or bruit.  He has no cervical, axillary, or inguinal adenopathy.  LUNGS:  Clear with normal effort.  HEART:  Regular rate and rhythm.  ABDOMEN:  Soft, flat, nontender without mass, lesions, CVA tenderness, or hepatosplenomegaly.  GU:  Reveals an unremarkable phallus and adequate meatus, scrotum unremarkable, testicles bilaterally descended, normal in size and position without masses or tenderness.  Epididymis was unremarkable.  No inguinal hernias noted.  Anus and perineum without lesions.  Rectal exam reveals normal sphincter tone.  Prostate 2+ and has benign consistency without nodules. Seminal vesicle is not palpable.  No rectal masses are noted.  EXTREMITIES:  Have full range of motion without edema.  NEUROLOGIC:  He is grossly intact.  SKIN:  Warm and dry.  ACCESSORY CLINICAL INFORMATION:  Preoperative upper track imaging demonstrated a horseshoe kidney.  He was found to have nonobstructing stones in both kidneys.  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  LABORATORIES:  Hemoglobin 13.6, hematocrit 39.5, white count 9.5, platelet count 418.  Chemistries, within normal limits.  Blood type A-positive.  CT revealed a 1 cm nodule that was possible granuloma versus metastases.  HOSPITAL COURSE:  At home, on the day prior to admission, the patient underwent a mechanical and antibiotic bowel prep.  He then came to the hospital.  He underwent a radical cystoprostatectomy with pelvic lymphadenectomy and the creation  of a ileal neobladder.  At surgery, he was found to have matted nodes in the right iliac fossa that, on frozen section, had poorly differentiated transitional cell carcinoma.  The nodes could not be be completely resected to the their intimate  relationship with the obturator nerve and iliac vessels.  Postoperatively, he was left with a wound drain, bilateral ureteral catheters, and a Foley catheter.  His blood loss was less than 1000 cc.  Postoperatively, he was taken to the intensive care unit for monitoring.  His postoperative hemoglobin was 10.9.  The first postoperative day, he was doing well with good urine output.  His wound was intact.  He was transferred to the floor.  He was transferred to a regular bed.  On the second postoperative day, he was having a little nausea after standing.  He attributed that to the narcotics.  His hemoglobin was down to 9.6, his potassium was 3.4.  With the mild hypokalemia, his IV fluids were changed to D5 1/2 normal saline with 40 mEq of potassium.  He was given Phenergan and his Dilaudid was held.  On the third postoperative day, he had no nausea.  He was passing gas.  His blood pressure was 175/102, heart rate 70.  He was afebrile. His wound was intact.  He had had good urine output.  Minimal Jackson-Pratt drainage.  His hemoglobin was stable at 10.4.  His potassium was 3.6.  He was started on clear liquids and his hydrochlorothiazide and Lotensin were resumed.  His IV fluids were further decreased.  On the first postoperative day, he continued to improve, was advanced to regular, and he was changed to p.o. pain medications.  He was given a Dulcolax suppository to stimulate bowel activity.  On the fifth postoperative day, he was doing well.  His wound drainage was minimal.  His pathology returned consistent with a poorly differentiated muscle invasive transitional cell carcinoma with N2 nodal disease at the right pelvis.  He was also noted to have an incidental Gleason 6 adenocarcinoma of the prostate.  On the sixth postoperative day, his drain  was removed.  He continued to do well.  He had had no further problems with nausea.  His wounds were intact.  On the seventh postoperative day, he  was felt to be ready for discharge home.  He had had a bowel movement. His hemoglobin was 12.8, his electrolytes were within normal limits with a potassium of 3.7.  White count was 13.1.  His urine remained clear.  DISCHARGE DIAGNOSES:  He was discharged home with the final diagnosis of a T3, N2, MX poorly differentiated, transitional cell carcinoma of the bladder with incidental grade 6 adenocarcinoma of the prostate.  SECONDARY DIAGNOSES:  Hypertension.  His postoperative course was without complications, although he did develop hypokalemia and had persistent hypertension early in his course.  DISCHARGE MEDICATIONS: 1. Vicodin 1-2 p.o. q.4-6h. p.r.n. pain. 2. Cipro 250 mg p.o. b.i.d.  He was instructed to resume his home medications.  He will follow up with Dr. Lynnae Sandhoff in one week, with myself in two weeks, Dr. Donnie Coffin in 1-2 weeks for consideration of adjuvant chemotherapy.  Staples were removed prior to discharge, and replaced with Steri-Strips.  DISPOSITION:  Home.  CONDITION ON DISCHARGE:  Improved.  PROGNOSIS:  Fair.  He is familiar bladder irrigations.  We will perform that on a daily basis. He is also aware of his activity restrictions. Attending Physician:  Evlyn Clines DD:  12/05/00 TD:  12/05/00 Job:  [id number]

## 2010-08-19 LAB — PROTIME-INR

## 2010-10-07 LAB — PROTIME-INR

## 2010-11-30 LAB — COMPREHENSIVE METABOLIC PANEL
ALT: 16
ALT: 16
ALT: 21
AST: 24
Albumin: 3.5
Alkaline Phosphatase: 56
BUN: 19
BUN: 20
CO2: 27
CO2: 29
Calcium: 8.8
Calcium: 9.2
Chloride: 104
Chloride: 106
Creatinine, Ser: 1.25
Creatinine, Ser: 1.3
GFR calc Af Amer: >60
GFR calc non Af Amer: 54 — ABNORMAL LOW
Glucose, Bld: 117 — ABNORMAL HIGH
Glucose, Bld: 145 — ABNORMAL HIGH
Potassium: 4.9
Sodium: 139
Sodium: 141
Total Bilirubin: 2 — ABNORMAL HIGH
Total Protein: 6.2

## 2010-11-30 LAB — CBC
HCT: 45.2
HCT: 49.5
Hemoglobin: 13.7
Hemoglobin: 15.1
Hemoglobin: 16.7
MCHC: 33.4
MCHC: 33.7
MCV: 87.6
MCV: 89
MCV: 89
Platelets: 210
RBC: 4.64
RBC: 4.69
RBC: 5.08
RBC: 5.59
RDW: 14.5
RDW: 14.8
WBC: 15.2 — ABNORMAL HIGH
WBC: 6.6

## 2010-11-30 LAB — DIFFERENTIAL
Basophils Absolute: 0
Basophils Relative: 0
Eosinophils Relative: 0
Lymphocytes Relative: 5 — ABNORMAL LOW
Monocytes Absolute: 0.7

## 2010-11-30 LAB — LIPASE, BLOOD: Lipase: 40

## 2010-11-30 LAB — PROTIME-INR
INR: 1.5
INR: 1.6 — ABNORMAL HIGH
Prothrombin Time: 19.6 — ABNORMAL HIGH

## 2010-11-30 LAB — URINALYSIS, ROUTINE W REFLEX MICROSCOPIC
Bilirubin Urine: NEGATIVE
Ketones, ur: NEGATIVE
Nitrite: NEGATIVE
Protein, ur: 30 — AB
Specific Gravity, Urine: 1.014
Urobilinogen, UA: 0.2

## 2010-12-03 LAB — BASIC METABOLIC PANEL
BUN: 25 — ABNORMAL HIGH
CO2: 28
Calcium: 9.8
GFR calc non Af Amer: 44 — ABNORMAL LOW
Glucose, Bld: 118 — ABNORMAL HIGH

## 2010-12-03 LAB — PROTIME-INR
INR: 1.7 — ABNORMAL HIGH
INR: 1.7 — ABNORMAL HIGH
INR: 2.1 — ABNORMAL HIGH
Prothrombin Time: 20.7 — ABNORMAL HIGH
Prothrombin Time: 20.9 — ABNORMAL HIGH
Prothrombin Time: 24 — ABNORMAL HIGH

## 2010-12-03 LAB — COMPREHENSIVE METABOLIC PANEL
BUN: 18
CO2: 26
Calcium: 8.8
Chloride: 105
Creatinine, Ser: 1.24
GFR calc Af Amer: >60
GFR calc non Af Amer: 57 — ABNORMAL LOW
Total Bilirubin: 1.1

## 2010-12-03 LAB — CBC
HCT: 40
HCT: 43.6
MCHC: 33.7
MCV: 90.1
Platelets: 210
Platelets: 246
RDW: 13.8
RDW: 14
WBC: 6

## 2010-12-03 LAB — URINALYSIS, ROUTINE W REFLEX MICROSCOPIC
Glucose, UA: NEGATIVE
Ketones, ur: NEGATIVE
Protein, ur: 30 — AB

## 2010-12-03 LAB — DIFFERENTIAL
Basophils Absolute: 0
Lymphocytes Relative: 16
Lymphs Abs: 1
Neutro Abs: 3.9
Neutrophils Relative %: 65

## 2010-12-03 LAB — URINE MICROSCOPIC-ADD ON

## 2010-12-03 LAB — CREATININE, SERUM
Creatinine, Ser: 1.37
GFR calc Af Amer: >60

## 2011-03-02 DIAGNOSIS — I4891 Unspecified atrial fibrillation: Secondary | ICD-10-CM | POA: Diagnosis not present

## 2011-03-02 DIAGNOSIS — G4700 Insomnia, unspecified: Secondary | ICD-10-CM | POA: Diagnosis not present

## 2011-03-02 DIAGNOSIS — I10 Essential (primary) hypertension: Secondary | ICD-10-CM | POA: Diagnosis not present

## 2011-03-02 DIAGNOSIS — Z7901 Long term (current) use of anticoagulants: Secondary | ICD-10-CM | POA: Diagnosis not present

## 2011-03-02 DIAGNOSIS — E785 Hyperlipidemia, unspecified: Secondary | ICD-10-CM | POA: Diagnosis not present

## 2011-03-02 DIAGNOSIS — G2581 Restless legs syndrome: Secondary | ICD-10-CM | POA: Diagnosis not present

## 2011-03-05 DIAGNOSIS — R413 Other amnesia: Secondary | ICD-10-CM | POA: Diagnosis not present

## 2011-03-15 DIAGNOSIS — Z7901 Long term (current) use of anticoagulants: Secondary | ICD-10-CM | POA: Diagnosis not present

## 2011-03-15 DIAGNOSIS — I4891 Unspecified atrial fibrillation: Secondary | ICD-10-CM | POA: Diagnosis not present

## 2011-05-09 DIAGNOSIS — E785 Hyperlipidemia, unspecified: Secondary | ICD-10-CM | POA: Diagnosis not present

## 2011-05-09 DIAGNOSIS — G4700 Insomnia, unspecified: Secondary | ICD-10-CM | POA: Diagnosis not present

## 2011-05-09 DIAGNOSIS — I4891 Unspecified atrial fibrillation: Secondary | ICD-10-CM | POA: Diagnosis not present

## 2011-05-09 DIAGNOSIS — Z7901 Long term (current) use of anticoagulants: Secondary | ICD-10-CM | POA: Diagnosis not present

## 2011-05-13 DIAGNOSIS — Z9849 Cataract extraction status, unspecified eye: Secondary | ICD-10-CM | POA: Diagnosis not present

## 2011-05-13 DIAGNOSIS — Z961 Presence of intraocular lens: Secondary | ICD-10-CM | POA: Diagnosis not present

## 2011-05-13 DIAGNOSIS — H26499 Other secondary cataract, unspecified eye: Secondary | ICD-10-CM | POA: Diagnosis not present

## 2011-05-13 DIAGNOSIS — H2640 Unspecified secondary cataract: Secondary | ICD-10-CM | POA: Diagnosis not present

## 2011-06-02 DIAGNOSIS — H26499 Other secondary cataract, unspecified eye: Secondary | ICD-10-CM | POA: Diagnosis not present

## 2011-06-22 DIAGNOSIS — H26499 Other secondary cataract, unspecified eye: Secondary | ICD-10-CM | POA: Diagnosis not present

## 2011-07-06 DIAGNOSIS — C3490 Malignant neoplasm of unspecified part of unspecified bronchus or lung: Secondary | ICD-10-CM | POA: Diagnosis not present

## 2011-07-07 DIAGNOSIS — G4700 Insomnia, unspecified: Secondary | ICD-10-CM | POA: Diagnosis not present

## 2011-07-07 DIAGNOSIS — I4891 Unspecified atrial fibrillation: Secondary | ICD-10-CM | POA: Diagnosis not present

## 2011-07-07 DIAGNOSIS — Z7901 Long term (current) use of anticoagulants: Secondary | ICD-10-CM | POA: Diagnosis not present

## 2011-07-07 DIAGNOSIS — C3490 Malignant neoplasm of unspecified part of unspecified bronchus or lung: Secondary | ICD-10-CM | POA: Diagnosis not present

## 2011-07-08 DIAGNOSIS — C3490 Malignant neoplasm of unspecified part of unspecified bronchus or lung: Secondary | ICD-10-CM | POA: Diagnosis not present

## 2011-07-14 DIAGNOSIS — D485 Neoplasm of uncertain behavior of skin: Secondary | ICD-10-CM | POA: Diagnosis not present

## 2011-07-14 DIAGNOSIS — B079 Viral wart, unspecified: Secondary | ICD-10-CM | POA: Diagnosis not present

## 2011-07-22 DIAGNOSIS — I6789 Other cerebrovascular disease: Secondary | ICD-10-CM | POA: Diagnosis not present

## 2011-07-22 DIAGNOSIS — C679 Malignant neoplasm of bladder, unspecified: Secondary | ICD-10-CM | POA: Diagnosis not present

## 2011-07-22 DIAGNOSIS — I4891 Unspecified atrial fibrillation: Secondary | ICD-10-CM | POA: Diagnosis not present

## 2011-07-22 DIAGNOSIS — C3490 Malignant neoplasm of unspecified part of unspecified bronchus or lung: Secondary | ICD-10-CM | POA: Diagnosis not present

## 2011-07-25 DIAGNOSIS — C3490 Malignant neoplasm of unspecified part of unspecified bronchus or lung: Secondary | ICD-10-CM | POA: Diagnosis not present

## 2011-07-25 DIAGNOSIS — G4700 Insomnia, unspecified: Secondary | ICD-10-CM | POA: Diagnosis not present

## 2011-07-25 DIAGNOSIS — I4891 Unspecified atrial fibrillation: Secondary | ICD-10-CM | POA: Diagnosis not present

## 2011-08-10 DIAGNOSIS — G4700 Insomnia, unspecified: Secondary | ICD-10-CM | POA: Diagnosis not present

## 2011-08-10 DIAGNOSIS — Z760 Encounter for issue of repeat prescription: Secondary | ICD-10-CM | POA: Diagnosis not present

## 2011-08-10 DIAGNOSIS — Z7901 Long term (current) use of anticoagulants: Secondary | ICD-10-CM | POA: Diagnosis not present

## 2011-08-10 DIAGNOSIS — I4891 Unspecified atrial fibrillation: Secondary | ICD-10-CM | POA: Diagnosis not present

## 2011-08-22 DIAGNOSIS — I4891 Unspecified atrial fibrillation: Secondary | ICD-10-CM | POA: Diagnosis not present

## 2011-08-22 DIAGNOSIS — Z7901 Long term (current) use of anticoagulants: Secondary | ICD-10-CM | POA: Diagnosis not present

## 2011-08-22 DIAGNOSIS — F411 Generalized anxiety disorder: Secondary | ICD-10-CM | POA: Diagnosis not present

## 2011-09-02 DIAGNOSIS — I4891 Unspecified atrial fibrillation: Secondary | ICD-10-CM | POA: Diagnosis not present

## 2011-09-02 DIAGNOSIS — Z7901 Long term (current) use of anticoagulants: Secondary | ICD-10-CM | POA: Diagnosis not present

## 2011-09-02 DIAGNOSIS — E785 Hyperlipidemia, unspecified: Secondary | ICD-10-CM | POA: Diagnosis not present

## 2011-09-02 DIAGNOSIS — F341 Dysthymic disorder: Secondary | ICD-10-CM | POA: Diagnosis not present

## 2011-09-02 DIAGNOSIS — N390 Urinary tract infection, site not specified: Secondary | ICD-10-CM | POA: Diagnosis not present

## 2011-09-02 DIAGNOSIS — G4700 Insomnia, unspecified: Secondary | ICD-10-CM | POA: Diagnosis not present

## 2011-09-02 DIAGNOSIS — I10 Essential (primary) hypertension: Secondary | ICD-10-CM | POA: Diagnosis not present

## 2011-09-02 DIAGNOSIS — Z760 Encounter for issue of repeat prescription: Secondary | ICD-10-CM | POA: Diagnosis not present

## 2011-09-30 ENCOUNTER — Encounter: Payer: Self-pay | Admitting: *Deleted

## 2011-09-30 ENCOUNTER — Telehealth: Payer: Self-pay | Admitting: *Deleted

## 2011-09-30 DIAGNOSIS — C679 Malignant neoplasm of bladder, unspecified: Secondary | ICD-10-CM

## 2011-09-30 DIAGNOSIS — I10 Essential (primary) hypertension: Secondary | ICD-10-CM

## 2011-09-30 DIAGNOSIS — E785 Hyperlipidemia, unspecified: Secondary | ICD-10-CM

## 2011-09-30 NOTE — Telephone Encounter (Signed)
Per orders from 09-30-2011 patient is aware of the appointment for 10-03-2011 at 1:00pm

## 2011-10-07 ENCOUNTER — Encounter: Payer: Self-pay | Admitting: *Deleted

## 2011-10-10 ENCOUNTER — Ambulatory Visit (HOSPITAL_BASED_OUTPATIENT_CLINIC_OR_DEPARTMENT_OTHER): Payer: Medicare Other | Admitting: Family

## 2011-10-10 ENCOUNTER — Telehealth: Payer: Self-pay | Admitting: *Deleted

## 2011-10-10 ENCOUNTER — Encounter: Payer: Self-pay | Admitting: *Deleted

## 2011-10-10 ENCOUNTER — Other Ambulatory Visit (HOSPITAL_BASED_OUTPATIENT_CLINIC_OR_DEPARTMENT_OTHER): Payer: Medicare Other | Admitting: Lab

## 2011-10-10 ENCOUNTER — Encounter: Payer: Self-pay | Admitting: Family

## 2011-10-10 VITALS — BP 127/83 | HR 80 | Temp 97.7°F | Resp 20 | Ht 72.0 in | Wt 214.6 lb

## 2011-10-10 DIAGNOSIS — Z5181 Encounter for therapeutic drug level monitoring: Secondary | ICD-10-CM

## 2011-10-10 DIAGNOSIS — I10 Essential (primary) hypertension: Secondary | ICD-10-CM

## 2011-10-10 DIAGNOSIS — I4891 Unspecified atrial fibrillation: Secondary | ICD-10-CM | POA: Insufficient documentation

## 2011-10-10 DIAGNOSIS — Z8673 Personal history of transient ischemic attack (TIA), and cerebral infarction without residual deficits: Secondary | ICD-10-CM

## 2011-10-10 DIAGNOSIS — C679 Malignant neoplasm of bladder, unspecified: Secondary | ICD-10-CM

## 2011-10-10 DIAGNOSIS — Z7901 Long term (current) use of anticoagulants: Secondary | ICD-10-CM

## 2011-10-10 DIAGNOSIS — C3490 Malignant neoplasm of unspecified part of unspecified bronchus or lung: Secondary | ICD-10-CM | POA: Insufficient documentation

## 2011-10-10 DIAGNOSIS — E785 Hyperlipidemia, unspecified: Secondary | ICD-10-CM

## 2011-10-10 LAB — CBC WITH DIFFERENTIAL/PLATELET
Basophils Absolute: 0 10*3/uL (ref 0.0–0.1)
HCT: 44.1 % (ref 38.4–49.9)
LYMPH%: 16.8 % (ref 14.0–49.0)
MCH: 30.3 pg (ref 27.2–33.4)
MCHC: 33.6 g/dL (ref 32.0–36.0)
MONO%: 12.2 % (ref 0.0–14.0)
RBC: 4.88 10*6/uL (ref 4.20–5.82)
RDW: 14.4 % (ref 11.0–14.6)
WBC: 8.1 10*3/uL (ref 4.0–10.3)
lymph#: 1.4 10*3/uL (ref 0.9–3.3)

## 2011-10-10 LAB — COMPREHENSIVE METABOLIC PANEL
CO2: 25 mEq/L (ref 19–32)
Calcium: 8.9 mg/dL (ref 8.4–10.5)
Chloride: 106 mEq/L (ref 96–112)
Creatinine, Ser: 1.27 mg/dL (ref 0.50–1.35)
Glucose, Bld: 96 mg/dL (ref 70–99)
Sodium: 139 mEq/L (ref 135–145)
Total Bilirubin: 0.9 mg/dL (ref 0.3–1.2)
Total Protein: 6.5 g/dL (ref 6.0–8.3)

## 2011-10-10 LAB — PROTIME-INR
INR: 3.3 (ref 2.00–3.50)
Protime: 39.6 Seconds — ABNORMAL HIGH (ref 10.6–13.4)

## 2011-10-10 NOTE — Patient Instructions (Addendum)
No Coumadin tomorrow.  Take 2.5 mg every day except Tuesdays.  We will await medical records from [place].  Return in 1 week for coumadin check only.  Return in 2 months with Dr. Donnie Coffin.

## 2011-10-10 NOTE — Progress Notes (Signed)
[facility]  Name: [name]                  DATE: 10/10/2011 MRN: [id number]                      DOB: Apr 13, 1930  DIAGNOSIS: Patient Active Problem List   Diagnosis Date Noted  . BLADDER CANCER 03/13/2008  . HYPERLIPIDEMIA 03/13/2008  . HYPERTENSION, BENIGN 02/13/2008   History lung cancer  Encounter Diagnoses  Name Primary?  . Bladder cancer Yes  . Atrial fibrillation   . Lung cancer   . History of stroke     PREVIOUS THERAPY: Cystoprostatectomy with ileal conduit reconstruction 04/22/2000, followed by six cycles of Gemzar and Navelbine completed on 04/05/01. 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 This was treated surgically.   CURRENT THERAPY: Surveillance.   INTERIM HISTORY: He has moved back to [facility] from [place], [place]. Is now residing at [facility]. Wants to reestablish care for surveillance. We are awaiting records from [place] regarding stage of lung cancer and recommended follow-up regimen. He remains on Coumadin for history strokes and atrial fibrillation. We will monitor INR and adjust Coumadin accordingly. He has appt with Dr. Excell Seltzer to establish care Sept. 12. Has appt with Dr. Janifer Adie Sept 27 to establish care. Primary care will be by Dr. Clelia Croft. He is accompanied by his daughter who wonders if there is some evolving dementia, having noticed memory deficits.   PHYSICAL EXAM: BP 127/83  Pulse 80  Temp 97.7 F (36.5 C) (Oral)  Resp 20  Ht 6' (1.829 m)  Wt 214 lb 9.6 oz (97.342 kg)  BMI 29.11 kg/m2 General: Well developed, well nourished, in no acute distress.  EENT: No ocular or oral lesions. No stomatitis.  Respiratory: Lungs are clear to auscultation bilaterally with normal respiratory movement and no accessory muscle use. Cardiac: No murmur, rub or tachycardia. No upper or lower extremity edema.  GI: Abdomen is soft, no palpable hepatosplenomegaly. No  fluid wave. No tenderness. Musculoskeletal: No kyphosis, no tenderness over the spine, ribs or hips. Lymph: No cervical, infraclavicular, axillary or inguinal adenopathy. Neuro: Walks with shuffling gait, unsteady on his feet. Walks with cane.  Psych: Alert and oriented X 3, appropriate mood and affect.    LABORATORY STUDIES:   Results for orders placed in visit on 10/10/11  CBC WITH DIFFERENTIAL      Component Value Range   WBC 8.1  4.0 - 10.3 10e3/uL   NEUT# 5.6  1.5 - 6.5 10e3/uL   HGB 14.8  13.0 - 17.1 g/dL   HCT 08.6  57.8 - 46.9 %   Platelets 169  140 - 400 10e3/uL   MCV 90.4  79.3 - 98.0 fL   MCH 30.3  27.2 - 33.4 pg   MCHC 33.6  32.0 - 36.0 g/dL   RBC 6.29  5.28 - 4.13 10e6/uL   RDW 14.4  11.0 - 14.6 %   lymph# 1.4  0.9 - 3.3 10e3/uL   MONO# 1.0 (*) 0.1 - 0.9 10e3/uL   Eosinophils Absolute 0.1  0.0 - 0.5 10e3/uL   Basophils Absolute 0.0  0.0 - 0.1 10e3/uL   NEUT% 69.6  39.0 - 75.0 %   LYMPH% 16.8  14.0 - 49.0 %   MONO% 12.2  0.0 - 14.0 %   EOS% 1.2  0.0 - 7.0 %   BASO% 0.2  0.0 - 2.0 %  nRBC 0  0 - 0 %  PROTIME-INR      Component Value Range   Protime 39.6 (*) 10.6 - 13.4 Seconds   INR 3.30  2.00 - 3.50   Lovenox No      IMPRESSION:   1. INR 3.30. Takes Coumadin 2.5 mg MWFSS, 5 mg TT.  2. New onset memory difficulties.  3. History bladder cancer, no evidence of recurrence. 4. History lung cancer, no evidence of recurrence. Awaiting records from [place] for staging, recent scans, etc.  5. History multiple strokes with neurologic deficit manifested by altered gait and balance difficulties.  5. History atrial fibrillation, on Coumadin.   PLAN:   1. No Coumadin tomorrow. Change coumadin dosing to late afternoon.  2. Take Coumadin 2.5 mg every day except Tuesdays, take 5 mg.   3. We will await medical records from [place] to make recommendations about scans, etc.   4. Return in 1 week for coumadin check only.  5. Return in 2 months with Dr. Donnie Coffin.

## 2011-10-10 NOTE — Telephone Encounter (Signed)
error 

## 2011-10-10 NOTE — Telephone Encounter (Signed)
Gave patient appointment for 10-17-2011 at 10:00am lab only 12-28-2011 lab and md starting at 10:00am printed out calendar and gave to the patient

## 2011-10-17 ENCOUNTER — Other Ambulatory Visit (HOSPITAL_BASED_OUTPATIENT_CLINIC_OR_DEPARTMENT_OTHER): Payer: Medicare Other | Admitting: Lab

## 2011-10-17 DIAGNOSIS — I4891 Unspecified atrial fibrillation: Secondary | ICD-10-CM

## 2011-10-17 LAB — PROTIME-INR: INR: 1.9 — ABNORMAL LOW (ref 2.00–3.50)

## 2011-11-03 ENCOUNTER — Ambulatory Visit (INDEPENDENT_AMBULATORY_CARE_PROVIDER_SITE_OTHER): Payer: Medicare Other | Admitting: Cardiovascular Disease

## 2011-11-03 ENCOUNTER — Encounter: Payer: Self-pay | Admitting: Cardiovascular Disease

## 2011-11-03 VITALS — BP 126/92 | HR 68 | Ht 72.0 in | Wt 214.4 lb

## 2011-11-03 DIAGNOSIS — I4891 Unspecified atrial fibrillation: Secondary | ICD-10-CM

## 2011-11-03 NOTE — Patient Instructions (Addendum)
Your physician wants you to follow-up in: 1 YEAR with Dr Excell Seltzer. You will receive a reminder letter in the mail two months in advance. If you don't receive a letter, please call our office to schedule the follow-up appointment.  Your physician recommends that you continue on your current medications as directed. Please refer to the Current Medication list given to you today.  We will request records from your Cardiologist.

## 2011-11-03 NOTE — Progress Notes (Signed)
HPI:  This is a 78 year old gentleman presenting for followup cardiac evaluation. This is my first encounter with the patient. He has seen Dr. wall in our practice a few years ago. The patient has lived in [place] and he is a retired vice principal of a high school. He has just moved back to [place] to be closer to his daughter. The patient is living and [place].  He has long-standing atrial fibrillation. He has a past history of stroke and apparently has had multiple silent infarcts as well. His major residual deficit is related to gait instability. He does not have focal weakness or speech problems. He has been maintained on warfarin for many years without bleeding problems.  The patient's past echocardiograms have revealed normal left ventricular systolic function and severely dilated left atrium. He has had no significant valvular disease.  From a symptomatic perspective, he is doing well. He reports that he shuffles with his gait. He has a history of falls but has not had major injuries. He has had no recent falls. He is very careful and uses his cane when he walks. He denies chest pain, chest pressure, shortness of breath, palpitations, lightheadedness, or syncope. He has occasional left leg swelling.  The patient has developed vascular dementia per his daughter's report.  Outpatient Encounter Prescriptions as of 11/03/2011  Medication Sig Dispense Refill  . Ascorbic Acid (VITAMIN C) 100 MG tablet Take 500 mg by mouth 2 (two) times daily.       Marland Kitchen atorvastatin (LIPITOR) 10 MG tablet Take 10 mg by mouth daily.      . cholecalciferol (VITAMIN D) 1000 UNITS tablet Take 1,000 Units by mouth daily.      . citalopram (CELEXA) 40 MG tablet Take 40 mg by mouth daily.      . Cyanocobalamin (VITAMIN B 12 PO) Take 500 mg by mouth 2 (two) times daily.      Marland Kitchen esomeprazole (NEXIUM) 40 MG capsule Take 40 mg by mouth daily before breakfast.      . fenoprofen (NALFON) 600 MG TABS Take 600 mg by  mouth 3 (three) times daily.      Marland Kitchen loperamide (IMODIUM) 2 MG capsule Take 1 mg by mouth daily.      . metoprolol succinate (TOPROL-XL) 100 MG 24 hr tablet Take 100 mg by mouth daily. Take with or immediately following a meal.      . temazepam (RESTORIL) 15 MG capsule Take 15 mg by mouth at bedtime as needed.      . vitamin E 100 UNIT capsule Take 100 Units by mouth daily.      Marland Kitchen warfarin (COUMADIN) 5 MG tablet Take 5 mg by mouth daily. Daily except for fridays      . digoxin (LANOXIN) 0.125 MG tablet Take 0.125 mg by mouth daily.      Marland Kitchen DISCONTD: nitrofurantoin (MACRODANTIN) 100 MG capsule Take 100 mg by mouth at bedtime.      Marland Kitchen DISCONTD: zolpidem (AMBIEN) 5 MG tablet Take 5 mg by mouth at bedtime as needed.        Sulfonamide derivatives  Past Medical History  Diagnosis Date  . Bladder cancer   . AF (paroxysmal atrial fibrillation)   . Lung cancer   . Atrial fibrillation     No past surgical history on file.  History   Social History  . Marital Status: Married    Spouse Name: N/A    Number of Children: N/A  . Years of Education: N/A  Occupational History  . Not on file.   Social History Main Topics  . Smoking status: Former Smoker    Quit date: 10/10/1991  . Smokeless tobacco: Not on file  . Alcohol Use:   . Drug Use: No  . Sexually Active: Not Currently   Other Topics Concern  . Not on file   Social History Narrative  . No narrative on file    Family history: The patient's mother died of "natural causes." His father died of a stroke at age 82. His brother died of an abdominal aneurysm in his 10s.  ROS: General: no fevers/chills/night sweats. Positive for fatigue Eyes: no blurry vision, diplopia, or amaurosis ENT: no sore throat or hearing loss Resp: no cough, wheezing, or hemoptysis CV: no edema or palpitations GI: no abdominal pain, nausea, vomiting, diarrhea, or constipation. Positive for reflux symptoms GU: no dysuria, frequency, or hematuria Skin: no  rash Neuro: no headache, numbness, tingling, or weakness of extremities Musculoskeletal: no joint pain or swelling Heme: no bleeding, DVT, or easy bruising Endo: no polydipsia or polyuria  BP 126/92  Pulse 68  Ht 6' (1.829 m)  Wt 97.251 kg (214 lb 6.4 oz)  BMI 29.08 kg/m2  PHYSICAL EXAM: Pt is alert and oriented, WD, WN, pleasant elderly male in no distress. HEENT: normal Neck: JVP normal. Carotid upstrokes normal without bruits. No thyromegaly. Lungs: equal expansion, clear bilaterally CV: Apex is discrete and nondisplaced, irregularly irregular without murmur or gallop Abd: soft, NT, +BS, no bruit, no hepatosplenomegaly Back: no CVA tenderness Ext: no C/C/E        DP/PT pulses intact and = Skin: warm and dry without rash Neuro: CNII-XII intact             Strength intact = bilaterally  EKG:  Atrial fibrillation 68 beats per minute,  ASSESSMENT AND PLAN: 1. Permanent atrial fibrillation. The patient will continue on his current medical program. This includes rate control and agents with long-acting metoprolol and Lanoxin. He is anticoagulated with warfarin and he will need to continue long-term anticoagulation considering his stroke history. I would like to see him back in followup in 12 months. We will send for his records from his previous cardiologist in [place]. We have obtained his contact information.  2. Hyperlipidemia. Will obtain records of his blood work. He is going to establish with Dr. Clelia Croft and I'm sure a lipid panel will be drawn as part of his baseline laboratory studies. The patient is on a statin drug.  3. Essential hypertension. Diastolic pressure shows mild elevation today. For now I would recommend continuing on long-acting metoprolol.  Tonny Bollman 11/03/2011 10:05 AM

## 2011-12-05 ENCOUNTER — Telehealth: Payer: Self-pay | Admitting: *Deleted

## 2011-12-05 NOTE — Telephone Encounter (Signed)
patient stated that dr.shaw will be handling the patient   Patient cancelled the appointment with dr.rubin

## 2011-12-20 ENCOUNTER — Encounter: Payer: Self-pay | Admitting: Cardiovascular Disease

## 2011-12-28 ENCOUNTER — Other Ambulatory Visit: Payer: Medicare Other | Admitting: Lab

## 2011-12-28 ENCOUNTER — Ambulatory Visit: Payer: Medicare Other | Admitting: Oncology

## 2011-12-30 ENCOUNTER — Encounter (HOSPITAL_COMMUNITY): Payer: Self-pay | Admitting: Emergency Medicine

## 2011-12-30 ENCOUNTER — Emergency Department (HOSPITAL_COMMUNITY): Payer: Medicare Other

## 2011-12-30 ENCOUNTER — Emergency Department (HOSPITAL_COMMUNITY)
Admission: EM | Admit: 2011-12-30 | Discharge: 2011-12-31 | Disposition: A | Discharge status: Home / Self Care | Payer: Medicare Other | Attending: Emergency Medicine | Admitting: Emergency Medicine

## 2011-12-30 DIAGNOSIS — Z85118 Personal history of other malignant neoplasm of bronchus and lung: Secondary | ICD-10-CM | POA: Insufficient documentation

## 2011-12-30 DIAGNOSIS — Z87891 Personal history of nicotine dependence: Secondary | ICD-10-CM | POA: Insufficient documentation

## 2011-12-30 DIAGNOSIS — I4891 Unspecified atrial fibrillation: Secondary | ICD-10-CM | POA: Insufficient documentation

## 2011-12-30 DIAGNOSIS — Z7901 Long term (current) use of anticoagulants: Secondary | ICD-10-CM | POA: Insufficient documentation

## 2011-12-30 DIAGNOSIS — Z8551 Personal history of malignant neoplasm of bladder: Secondary | ICD-10-CM | POA: Insufficient documentation

## 2011-12-30 DIAGNOSIS — Y939 Activity, unspecified: Secondary | ICD-10-CM | POA: Insufficient documentation

## 2011-12-30 DIAGNOSIS — W010XXA Fall on same level from slipping, tripping and stumbling without subsequent striking against object, initial encounter: Secondary | ICD-10-CM | POA: Insufficient documentation

## 2011-12-30 DIAGNOSIS — S2239XA Fracture of one rib, unspecified side, initial encounter for closed fracture: Secondary | ICD-10-CM | POA: Insufficient documentation

## 2011-12-30 DIAGNOSIS — Z79899 Other long term (current) drug therapy: Secondary | ICD-10-CM | POA: Insufficient documentation

## 2011-12-30 DIAGNOSIS — Y929 Unspecified place or not applicable: Secondary | ICD-10-CM | POA: Insufficient documentation

## 2011-12-30 DIAGNOSIS — E785 Hyperlipidemia, unspecified: Secondary | ICD-10-CM | POA: Insufficient documentation

## 2011-12-30 LAB — CBC WITH DIFFERENTIAL/PLATELET
Hemoglobin: 14.4 g/dL (ref 13.0–17.0)
Lymphocytes Relative: 11 % — ABNORMAL LOW (ref 12–46)
Lymphs Abs: 1.1 10*3/uL (ref 0.7–4.0)
MCH: 29.8 pg (ref 26.0–34.0)
Monocytes Relative: 8 % (ref 3–12)
Neutro Abs: 7.7 10*3/uL (ref 1.7–7.7)
Neutrophils Relative %: 79 % — ABNORMAL HIGH (ref 43–77)
Platelets: 182 10*3/uL (ref 150–400)
RBC: 4.84 MIL/uL (ref 4.22–5.81)
WBC: 9.8 10*3/uL (ref 4.0–10.5)

## 2011-12-30 LAB — BASIC METABOLIC PANEL
BUN: 21 mg/dL (ref 6–23)
CO2: 27 mEq/L (ref 19–32)
Chloride: 102 mEq/L (ref 96–112)
GFR calc non Af Amer: 57 mL/min — ABNORMAL LOW (ref >90)
Glucose, Bld: 126 mg/dL — ABNORMAL HIGH (ref 70–99)
Potassium: 5.2 mEq/L — ABNORMAL HIGH (ref 3.5–5.1)
Sodium: 140 mEq/L (ref 135–145)

## 2011-12-30 LAB — PROTIME-INR: INR: 1.37 (ref 0.00–1.49)

## 2011-12-30 MED ORDER — HYDROCODONE-ACETAMINOPHEN 5-325 MG PO TABS
1.0000 | ORAL_TABLET | Freq: Once | ORAL | Status: AC
  Administered 2011-12-30: 1 via ORAL
  Filled 2011-12-30: qty 1

## 2011-12-30 NOTE — ED Notes (Addendum)
Patient's family member asking about delay and wait time; apologized about delay.  Patient upset and yelling, "This is ridiculous.  You have an elderly man with broken ribs waiting for hours.  Who determines acuity? I was told that he would have x-rays done and be seen by a doctor after that.  He hasn't even been seen by the triage nurse! All he had done was blood work".  Patient informed that x-rays were not initially ordered by day shift triage nurse and the chart would be looked over and patient assessed again to determine the need for x-rays (original triage nurse left at shift change).    X-rays ordered per protocol; family member (daughter) notified of update.  Daughter upset yelling, "They were just now ordered"? Informed daughter that x-rays ordered from the current triage nurse's observation.  Daughter walked away yelling, "This is ridiculous!".  Barbara Cower, RN (charge) notified of situation; patient advocate now talking with patient.

## 2011-12-30 NOTE — ED Notes (Signed)
Pt c/o multiple falls over last couple of days; pt c/o right shoulder pain and left rib pain; pt sts on coumadin; pt with hx of stroke with deficits that causes falls

## 2011-12-31 LAB — URINALYSIS, ROUTINE W REFLEX MICROSCOPIC
Bilirubin Urine: NEGATIVE
Nitrite: NEGATIVE
Specific Gravity, Urine: 1.014 (ref 1.005–1.030)
Urobilinogen, UA: 0.2 mg/dL (ref 0.0–1.0)
pH: 5.5 (ref 5.0–8.0)

## 2011-12-31 LAB — URINE MICROSCOPIC-ADD ON

## 2011-12-31 MED ORDER — HYDROCODONE-ACETAMINOPHEN 5-325 MG PO TABS: 1.0000 | ORAL_TABLET | Freq: Four times a day (QID) | ORAL | Status: DC | PRN

## 2011-12-31 NOTE — ED Provider Notes (Signed)
History     CSN: [id number]  Arrival date & time 12/30/11  4098   First MD Initiated Contact with Patient 12/30/11 2246      Chief Complaint  Patient presents with  . Fall    (Consider location/radiation/quality/duration/timing/severity/associated sxs/prior treatment) HPI It has fallen twice this week. He last tripped today. Striking his left ribs. He denies abdominal pain denies shortness of breath complains of left posterior rib pain and right shoulder pain. Shoulder pain is from fall 3 days ago,. He denies striking his head. Denies headache denies neck pain. No treatment prior to coming here. Pain is worse with movement improved with remains still the pain is moderate at present no other associated symptoms Past Medical History  Diagnosis Date  . Bladder cancer   . Atrial fibrillation, permanent   . Lung cancer     Status post surgery resection  . Hyperlipidemia     History reviewed. No pertinent past surgical history.  History reviewed. No pertinent family history.  History  Substance Use Topics  . Smoking status: Former Smoker    Quit date: 10/10/1991  . Smokeless tobacco: Not on file  . Alcohol Use:    No alcohol no drugs lives in retirement home   Review of Systems  Constitutional: Negative.   HENT: Negative.   Respiratory: Negative.   Cardiovascular: Negative.   Gastrointestinal: Negative.   Musculoskeletal: Positive for back pain and arthralgias.       Left posterior rib pain, right shoulder pain  Skin: Negative.   Neurological: Negative.        Walks with walker or cane  Hematological: Negative.   Psychiatric/Behavioral: Negative.   All other systems reviewed and are negative.    Allergies  Sulfonamide derivatives  Home Medications   Current Outpatient Rx  Name  Route  Sig  Dispense  Refill  . ASCORBIC ACID 1000 MG PO TABS   Oral   Take 1,000 mg by mouth daily.         . ATORVASTATIN CALCIUM 10 MG PO TABS   Oral   Take 10 mg by mouth  daily.         . CEPHALEXIN 500 MG PO CAPS   Oral   Take 500 mg by mouth daily. For self caths         . VITAMIN D 1000 UNITS PO TABS   Oral   Take 1,000 Units by mouth daily.         Marland Kitchen CITALOPRAM HYDROBROMIDE 40 MG PO TABS   Oral   Take 40 mg by mouth daily.         Marland Kitchen VITAMIN B 12 PO   Oral   Take 500 mg by mouth 2 (two) times daily.         Marland Kitchen DIGOXIN 0.125 MG PO TABS   Oral   Take 0.125 mg by mouth daily.         Marland Kitchen ESOMEPRAZOLE MAGNESIUM 40 MG PO CPDR   Oral   Take 40 mg by mouth daily as needed. For acid reflux         . BACID PO TABS   Oral   Take 1 tablet by mouth daily.         Marland Kitchen LOPERAMIDE HCL 2 MG PO CAPS   Oral   Take 1 mg by mouth daily as needed. For diarrhea         . METOPROLOL SUCCINATE ER 100 MG PO TB24   Oral  Take 100 mg by mouth daily. Take with or immediately following a meal.         . TEMAZEPAM 15 MG PO CAPS   Oral   Take 15 mg by mouth at bedtime as needed. For sleep         . VITAMIN E 100 UNITS PO CAPS   Oral   Take 100 Units by mouth daily.         . WARFARIN SODIUM 5 MG PO TABS   Oral   Take 5 mg by mouth daily. Daily except for tuesdays (he does not take warfarin at all on tuesdays)           BP 108/67  Pulse 67  Temp 98.6 F (37 C) (Oral)  Resp 18  Ht 6' (1.829 m)  Wt 205 lb (92.987 kg)  BMI 27.80 kg/m2  SpO2 95%  Physical Exam  Nursing note and vitals reviewed. Constitutional: He appears well-developed and well-nourished.  HENT:  Head: Normocephalic and atraumatic.  Eyes: Conjunctivae normal are normal. Pupils are equal, round, and reactive to light.  Neck: Neck supple. No tracheal deviation present. No thyromegaly present.  Cardiovascular: Normal rate and regular rhythm.   No murmur heard. Pulmonary/Chest: Effort normal and breath sounds normal.  Abdominal: Soft. Bowel sounds are normal. He exhibits no distension. There is no tenderness.  Musculoskeletal: Normal range of motion. He  exhibits no edema and no tenderness.  Neurological: He is alert. Coordination normal.  Skin: Skin is warm and dry. No rash noted.  Psychiatric: He has a normal mood and affect.    ED Course  Procedures (including critical care time)  Labs Reviewed  CBC WITH DIFFERENTIAL - Abnormal; Notable for the following:    Neutrophils Relative 79 (*)     Lymphocytes Relative 11 (*)     All other components within normal limits  BASIC METABOLIC PANEL - Abnormal; Notable for the following:    Potassium 5.2 (*)  HEMOLYSIS AT THIS LEVEL MAY AFFECT RESULT   Glucose, Bld 126 (*)     GFR calc non Af Amer 57 (*)     GFR calc Af Amer 66 (*)     All other components within normal limits  PROTIME-INR - Abnormal; Notable for the following:    Prothrombin Time 16.5 (*)     All other components within normal limits  URINALYSIS, ROUTINE W REFLEX MICROSCOPIC   Dg Ribs Unilateral W/chest Left  12/30/2011  *RADIOLOGY REPORT*  Clinical Data: Post fall, now with left-sided rib pain  LEFT RIBS AND CHEST - 3+ VIEW  Comparison: 05/21/2009; CT of chest, abdomen pelvis - 04/28/2009  Findings:  Grossly unchanged cardiac silhouette and mediastinal contours with atherosclerotic calcifications within a mildly tortuous thoracic aorta.  Interval resection of previously identified left mid lung nodule with surgical change of the left hilum.  Minimal bibasilar opacities, right greater than left, favored to represent atelectasis.  No definite pleural effusion or pneumothorax.  Acute minimally displaced fracture of the lateral aspect of the left eighth rib.  Post resection of the posterior aspect of the left fourth rib with chronic deformity of the posterior lateral aspect of the left fifth rib.  IMPRESSION: 1.  Acute minimally displaced fracture of the lateral aspect of the left eighth rib. 2.  Postsurgical change of the left lung and thorax as above.   Original Report Authenticated By: Tacey Ruiz, MD    Dg Shoulder  Right  12/30/2011  *RADIOLOGY REPORT*  Clinical  Data: Post fall, now with right shoulder pain  RIGHT SHOULDER - 2+ VIEW  Comparison: None.  Findings:  No fracture or dislocation.  Minimal degenerative change of the [facility] joint with joint space loss and minimal inferiorly-directed osteophytosis.  No evidence of calcific tendonitis.  Regional soft tissues are normal.  Limited visualization of adjacent thorax is normal.  IMPRESSION:  1.  No fracture or dislocation. 2.  Minimal degenerative change of the [facility] joint.   Original Report Authenticated By: Tacey Ruiz, MD    Results for orders placed during the hospital encounter of 12/30/11  CBC WITH DIFFERENTIAL      Component Value Range   WBC 9.8  4.0 - 10.5 K/uL   RBC 4.84  4.22 - 5.81 MIL/uL   Hemoglobin 14.4  13.0 - 17.0 g/dL   HCT 16.1  09.6 - 04.5 %   MCV 93.6  78.0 - 100.0 fL   MCH 29.8  26.0 - 34.0 pg   MCHC 31.8  30.0 - 36.0 g/dL   RDW 40.9  81.1 - 91.4 %   Platelets 182  150 - 400 K/uL   Neutrophils Relative 79 (*) 43 - 77 %   Neutro Abs 7.7  1.7 - 7.7 K/uL   Lymphocytes Relative 11 (*) 12 - 46 %   Lymphs Abs 1.1  0.7 - 4.0 K/uL   Monocytes Relative 8  3 - 12 %   Monocytes Absolute 0.8  0.1 - 1.0 K/uL   Eosinophils Relative 2  0 - 5 %   Eosinophils Absolute 0.2  0.0 - 0.7 K/uL   Basophils Relative 0  0 - 1 %   Basophils Absolute 0.0  0.0 - 0.1 K/uL  BASIC METABOLIC PANEL      Component Value Range   Sodium 140  135 - 145 mEq/L   Potassium 5.2 (*) 3.5 - 5.1 mEq/L   Chloride 102  96 - 112 mEq/L   CO2 27  19 - 32 mEq/L   Glucose, Bld 126 (*) 70 - 99 mg/dL   BUN 21  6 - 23 mg/dL   Creatinine, Ser 7.82  0.50 - 1.35 mg/dL   Calcium 95.6  8.4 - 21.3 mg/dL   GFR calc non Af Amer 57 (*) >90 mL/min   GFR calc Af Amer 66 (*) >90 mL/min  PROTIME-INR      Component Value Range   Prothrombin Time 16.5 (*) 11.6 - 15.2 seconds   INR 1.37  0.00 - 1.49  URINALYSIS, ROUTINE W REFLEX MICROSCOPIC      Component Value Range   Color, Urine YELLOW   YELLOW   APPearance TURBID (*) CLEAR   Specific Gravity, Urine 1.014  1.005 - 1.030   pH 5.5  5.0 - 8.0   Glucose, UA NEGATIVE  NEGATIVE mg/dL   Hgb urine dipstick LARGE (*) NEGATIVE   Bilirubin Urine NEGATIVE  NEGATIVE   Ketones, ur NEGATIVE  NEGATIVE mg/dL   Protein, ur 086 (*) NEGATIVE mg/dL   Urobilinogen, UA 0.2  0.0 - 1.0 mg/dL   Nitrite NEGATIVE  NEGATIVE   Leukocytes, UA LARGE (*) NEGATIVE  URINE MICROSCOPIC-ADD ON      Component Value Range   Squamous Epithelial / LPF RARE  RARE   WBC, UA TOO NUMEROUS TO COUNT  <3 WBC/hpf   RBC / HPF 7-10  <3 RBC/hpf   Bacteria, UA FEW (*) RARE   Dg Ribs Unilateral W/chest Left  12/30/2011  *RADIOLOGY REPORT*  Clinical Data:  Post fall, now with left-sided rib pain  LEFT RIBS AND CHEST - 3+ VIEW  Comparison: 05/21/2009; CT of chest, abdomen pelvis - 04/28/2009  Findings:  Grossly unchanged cardiac silhouette and mediastinal contours with atherosclerotic calcifications within a mildly tortuous thoracic aorta.  Interval resection of previously identified left mid lung nodule with surgical change of the left hilum.  Minimal bibasilar opacities, right greater than left, favored to represent atelectasis.  No definite pleural effusion or pneumothorax.  Acute minimally displaced fracture of the lateral aspect of the left eighth rib.  Post resection of the posterior aspect of the left fourth rib with chronic deformity of the posterior lateral aspect of the left fifth rib.  IMPRESSION: 1.  Acute minimally displaced fracture of the lateral aspect of the left eighth rib. 2.  Postsurgical change of the left lung and thorax as above.   Original Report Authenticated By: Tacey Ruiz, MD    Dg Shoulder Right  12/30/2011  *RADIOLOGY REPORT*  Clinical Data: Post fall, now with right shoulder pain  RIGHT SHOULDER - 2+ VIEW  Comparison: None.  Findings:  No fracture or dislocation.  Minimal degenerative change of the [facility] joint with joint space loss and minimal  inferiorly-directed osteophytosis.  No evidence of calcific tendonitis.  Regional soft tissues are normal.  Limited visualization of adjacent thorax is normal.  IMPRESSION:  1.  No fracture or dislocation. 2.  Minimal degenerative change of the [facility] joint.   Original Report Authenticated By: Tacey Ruiz, MD       No diagnosis found.  X-rays reviewed by me 1:30 AM pain improved after treatment with Vicodin. Patient is able to ambulate with pain although gait is slightly unsteady  MDM  Plan urine sent for culture patient is chronically on Keflex prophylactically as he self catheterizes Prescription Vicodin He is encouraged to keep his scheduled appointment with Dr. Clelia Croft on 01/02/2012 Redness #1 fall #2 left eighth rib fracture #3 contusion right shoulder        Doug Sou, MD 12/31/11 [phone]

## 2011-12-31 NOTE — ED Notes (Signed)
Patient given copy of discharge paperwork; went over discharge instructions with patient.  Patient instructed to take prescriptions as directed, to not drive/drink while taking medications, and to keep appointment on November 11th.  Instructed to return to the ED for new, worsening, or concerning symptoms.

## 2012-01-03 LAB — URINE CULTURE

## 2012-01-04 NOTE — ED Notes (Signed)
+   Urine Chart sent to EDP office for review. 

## 2012-01-06 ENCOUNTER — Other Ambulatory Visit: Payer: Medicare Other | Admitting: Lab

## 2012-01-06 ENCOUNTER — Ambulatory Visit: Payer: Medicare Other | Admitting: Oncology

## 2012-02-29 DIAGNOSIS — Z7901 Long term (current) use of anticoagulants: Secondary | ICD-10-CM | POA: Diagnosis not present

## 2012-02-29 DIAGNOSIS — I4891 Unspecified atrial fibrillation: Secondary | ICD-10-CM | POA: Diagnosis not present

## 2012-04-04 DIAGNOSIS — I4891 Unspecified atrial fibrillation: Secondary | ICD-10-CM | POA: Diagnosis not present

## 2012-04-04 DIAGNOSIS — Z7901 Long term (current) use of anticoagulants: Secondary | ICD-10-CM | POA: Diagnosis not present

## 2012-04-13 DIAGNOSIS — I4891 Unspecified atrial fibrillation: Secondary | ICD-10-CM | POA: Diagnosis not present

## 2012-04-13 DIAGNOSIS — Z7901 Long term (current) use of anticoagulants: Secondary | ICD-10-CM | POA: Diagnosis not present

## 2012-04-29 ENCOUNTER — Inpatient Hospital Stay (HOSPITAL_COMMUNITY)
Admission: EM | Admit: 2012-04-29 | Discharge: 2012-05-03 | DRG: 872 | Disposition: A | Discharge status: Skilled Nursing Facility | Payer: Medicare Other | Attending: Internal Medicine | Admitting: Internal Medicine

## 2012-04-29 ENCOUNTER — Encounter (HOSPITAL_COMMUNITY): Payer: Self-pay | Admitting: *Deleted

## 2012-04-29 DIAGNOSIS — Z79899 Other long term (current) drug therapy: Secondary | ICD-10-CM

## 2012-04-29 DIAGNOSIS — I498 Other specified cardiac arrhythmias: Secondary | ICD-10-CM | POA: Diagnosis not present

## 2012-04-29 DIAGNOSIS — Z8551 Personal history of malignant neoplasm of bladder: Secondary | ICD-10-CM | POA: Diagnosis not present

## 2012-04-29 DIAGNOSIS — M899 Disorder of bone, unspecified: Secondary | ICD-10-CM | POA: Diagnosis present

## 2012-04-29 DIAGNOSIS — A4152 Sepsis due to Pseudomonas: Secondary | ICD-10-CM | POA: Diagnosis present

## 2012-04-29 DIAGNOSIS — E785 Hyperlipidemia, unspecified: Secondary | ICD-10-CM | POA: Diagnosis present

## 2012-04-29 DIAGNOSIS — Z9181 History of falling: Secondary | ICD-10-CM | POA: Diagnosis not present

## 2012-04-29 DIAGNOSIS — Z8673 Personal history of transient ischemic attack (TIA), and cerebral infarction without residual deficits: Secondary | ICD-10-CM | POA: Diagnosis not present

## 2012-04-29 DIAGNOSIS — D72829 Elevated white blood cell count, unspecified: Secondary | ICD-10-CM | POA: Diagnosis present

## 2012-04-29 DIAGNOSIS — E8881 Metabolic syndrome: Secondary | ICD-10-CM | POA: Diagnosis present

## 2012-04-29 DIAGNOSIS — I10 Essential (primary) hypertension: Secondary | ICD-10-CM | POA: Diagnosis present

## 2012-04-29 DIAGNOSIS — R269 Unspecified abnormalities of gait and mobility: Secondary | ICD-10-CM | POA: Diagnosis present

## 2012-04-29 DIAGNOSIS — A419 Sepsis, unspecified organism: Secondary | ICD-10-CM | POA: Diagnosis not present

## 2012-04-29 DIAGNOSIS — Z7901 Long term (current) use of anticoagulants: Secondary | ICD-10-CM | POA: Diagnosis not present

## 2012-04-29 DIAGNOSIS — Z5189 Encounter for other specified aftercare: Secondary | ICD-10-CM | POA: Diagnosis not present

## 2012-04-29 DIAGNOSIS — Z9221 Personal history of antineoplastic chemotherapy: Secondary | ICD-10-CM

## 2012-04-29 DIAGNOSIS — N179 Acute kidney failure, unspecified: Secondary | ICD-10-CM | POA: Diagnosis present

## 2012-04-29 DIAGNOSIS — I4891 Unspecified atrial fibrillation: Secondary | ICD-10-CM | POA: Diagnosis present

## 2012-04-29 DIAGNOSIS — F329 Major depressive disorder, single episode, unspecified: Secondary | ICD-10-CM | POA: Diagnosis present

## 2012-04-29 DIAGNOSIS — F3289 Other specified depressive episodes: Secondary | ICD-10-CM | POA: Diagnosis present

## 2012-04-29 DIAGNOSIS — Z902 Acquired absence of lung [part of]: Secondary | ICD-10-CM | POA: Diagnosis not present

## 2012-04-29 DIAGNOSIS — E8779 Other fluid overload: Secondary | ICD-10-CM | POA: Diagnosis present

## 2012-04-29 DIAGNOSIS — IMO0002 Reserved for concepts with insufficient information to code with codable children: Secondary | ICD-10-CM | POA: Diagnosis not present

## 2012-04-29 DIAGNOSIS — N390 Urinary tract infection, site not specified: Secondary | ICD-10-CM | POA: Diagnosis present

## 2012-04-29 DIAGNOSIS — G609 Hereditary and idiopathic neuropathy, unspecified: Secondary | ICD-10-CM | POA: Diagnosis present

## 2012-04-29 DIAGNOSIS — E781 Pure hyperglyceridemia: Secondary | ICD-10-CM | POA: Diagnosis present

## 2012-04-29 DIAGNOSIS — M545 Low back pain, unspecified: Secondary | ICD-10-CM | POA: Diagnosis not present

## 2012-04-29 DIAGNOSIS — B965 Pseudomonas (aeruginosa) (mallei) (pseudomallei) as the cause of diseases classified elsewhere: Secondary | ICD-10-CM | POA: Diagnosis not present

## 2012-04-29 DIAGNOSIS — Z85118 Personal history of other malignant neoplasm of bronchus and lung: Secondary | ICD-10-CM

## 2012-04-29 DIAGNOSIS — R6510 Systemic inflammatory response syndrome (SIRS) of non-infectious origin without acute organ dysfunction: Secondary | ICD-10-CM | POA: Diagnosis not present

## 2012-04-29 DIAGNOSIS — S0990XA Unspecified injury of head, initial encounter: Secondary | ICD-10-CM | POA: Diagnosis not present

## 2012-04-29 DIAGNOSIS — K219 Gastro-esophageal reflux disease without esophagitis: Secondary | ICD-10-CM | POA: Diagnosis present

## 2012-04-29 DIAGNOSIS — J984 Other disorders of lung: Secondary | ICD-10-CM | POA: Diagnosis not present

## 2012-04-29 DIAGNOSIS — R001 Bradycardia, unspecified: Secondary | ICD-10-CM | POA: Diagnosis present

## 2012-04-29 DIAGNOSIS — W19XXXA Unspecified fall, initial encounter: Secondary | ICD-10-CM

## 2012-04-29 HISTORY — DX: Urinary tract infection, site not specified: N39.0

## 2012-04-29 HISTORY — DX: Essential (primary) hypertension: I10

## 2012-04-29 HISTORY — DX: Unspecified dementia, unspecified severity, without behavioral disturbance, psychotic disturbance, mood disturbance, and anxiety: F03.90

## 2012-04-29 NOTE — ED Notes (Signed)
GEX:BM84<XL> Expected date:<BR> Expected time:<BR> Means of arrival:<BR> Comments:<BR> EMS/81 year ols male with fall tonight and 2 days ago-back pain-frequent falls and hx UTI

## 2012-04-29 NOTE — ED Notes (Signed)
Pt fell Friday per family and turned to fall on the right side. He hit the corner of a piece of furniture to his mid back and has been complaining of back pain since. Pt had another fall this evening. Family states that pt falls often when he has developed a UTI.

## 2012-04-30 ENCOUNTER — Encounter (HOSPITAL_COMMUNITY): Payer: Self-pay | Admitting: Internal Medicine

## 2012-04-30 ENCOUNTER — Inpatient Hospital Stay (HOSPITAL_COMMUNITY): Payer: Medicare Other

## 2012-04-30 ENCOUNTER — Emergency Department (HOSPITAL_COMMUNITY): Payer: Medicare Other

## 2012-04-30 DIAGNOSIS — N179 Acute kidney failure, unspecified: Secondary | ICD-10-CM | POA: Diagnosis not present

## 2012-04-30 DIAGNOSIS — N390 Urinary tract infection, site not specified: Secondary | ICD-10-CM | POA: Diagnosis present

## 2012-04-30 LAB — COMPREHENSIVE METABOLIC PANEL
Alkaline Phosphatase: 53 U/L (ref 39–117)
BUN: 31 mg/dL — ABNORMAL HIGH (ref 6–23)
Creatinine, Ser: 1.65 mg/dL — ABNORMAL HIGH (ref 0.50–1.35)
GFR calc Af Amer: 44 mL/min — ABNORMAL LOW (ref >90)
Glucose, Bld: 126 mg/dL — ABNORMAL HIGH (ref 70–99)
Potassium: 4.5 mEq/L (ref 3.5–5.1)
Total Bilirubin: 0.9 mg/dL (ref 0.3–1.2)
Total Protein: 6.7 g/dL (ref 6.0–8.3)

## 2012-04-30 LAB — CBC WITH DIFFERENTIAL/PLATELET
Basophils Relative: 0 % (ref 0–1)
Eosinophils Absolute: 0.1 10*3/uL (ref 0.0–0.7)
HCT: 43.2 % (ref 39.0–52.0)
Hemoglobin: 14.1 g/dL (ref 13.0–17.0)
Lymphs Abs: 0.6 10*3/uL — ABNORMAL LOW (ref 0.7–4.0)
MCH: 30.3 pg (ref 26.0–34.0)
MCHC: 32.6 g/dL (ref 30.0–36.0)
MCV: 92.9 fL (ref 78.0–100.0)
Monocytes Absolute: 2.1 10*3/uL — ABNORMAL HIGH (ref 0.1–1.0)
Monocytes Relative: 12 % (ref 3–12)
Neutrophils Relative %: 85 % — ABNORMAL HIGH (ref 43–77)
RBC: 4.65 MIL/uL (ref 4.22–5.81)

## 2012-04-30 LAB — PROCALCITONIN: Procalcitonin: 0.15 ng/mL

## 2012-04-30 LAB — URINALYSIS, MICROSCOPIC ONLY
Nitrite: NEGATIVE
Protein, ur: 100 mg/dL — AB
Specific Gravity, Urine: 1.017 (ref 1.005–1.030)
Urobilinogen, UA: 0.2 mg/dL (ref 0.0–1.0)

## 2012-04-30 LAB — LACTIC ACID, PLASMA: Lactic Acid, Venous: 2.1 mmol/L (ref 0.5–2.2)

## 2012-04-30 LAB — MRSA PCR SCREENING: MRSA by PCR: NEGATIVE

## 2012-04-30 MED ORDER — HYDROMORPHONE HCL PF 1 MG/ML IJ SOLN: 0.5000 mg | INTRAMUSCULAR | Status: AC | PRN

## 2012-04-30 MED ORDER — ACETAMINOPHEN 325 MG PO TABS: 650.0000 mg | ORAL_TABLET | Freq: Four times a day (QID) | ORAL | Status: DC | PRN

## 2012-04-30 MED ORDER — VITAMIN D3 25 MCG (1000 UNIT) PO TABS
1000.0000 [IU] | ORAL_TABLET | Freq: Every day | ORAL | Status: DC
  Administered 2012-04-30 – 2012-05-03 (×4): 1000 [IU] via ORAL
  Filled 2012-04-30 (×4): qty 1

## 2012-04-30 MED ORDER — ONDANSETRON HCL 4 MG PO TABS: 4.0000 mg | ORAL_TABLET | Freq: Four times a day (QID) | ORAL | Status: DC | PRN

## 2012-04-30 MED ORDER — ACETAMINOPHEN 650 MG RE SUPP: 650.0000 mg | Freq: Four times a day (QID) | RECTAL | Status: DC | PRN

## 2012-04-30 MED ORDER — BIOTENE DRY MOUTH MT LIQD
15.0000 mL | Freq: Two times a day (BID) | OROMUCOSAL | Status: DC
  Administered 2012-04-30 – 2012-05-03 (×6): 15 mL via OROMUCOSAL

## 2012-04-30 MED ORDER — DEXTROSE 5 % IV SOLN
2.0000 g | Freq: Every day | INTRAVENOUS | Status: DC
  Administered 2012-04-30: 2 g via INTRAVENOUS
  Filled 2012-04-30: qty 2

## 2012-04-30 MED ORDER — WARFARIN - PHARMACIST DOSING INPATIENT: Freq: Every day | Status: DC

## 2012-04-30 MED ORDER — VITAMIN E 45 MG (100 UNIT) PO CAPS
100.0000 [IU] | ORAL_CAPSULE | Freq: Every day | ORAL | Status: DC
  Administered 2012-04-30 – 2012-05-03 (×4): 100 [IU] via ORAL
  Filled 2012-04-30 (×4): qty 1

## 2012-04-30 MED ORDER — SODIUM CHLORIDE 0.9 % IV SOLN
INTRAVENOUS | Status: DC
  Administered 2012-05-01 – 2012-05-02 (×3): via INTRAVENOUS

## 2012-04-30 MED ORDER — CALCIUM CARBONATE 1250 (500 CA) MG PO TABS
1.0000 | ORAL_TABLET | Freq: Every day | ORAL | Status: DC
  Administered 2012-04-30 – 2012-05-03 (×4): 500 mg via ORAL
  Filled 2012-04-30 (×4): qty 1

## 2012-04-30 MED ORDER — PANTOPRAZOLE SODIUM 40 MG PO TBEC
40.0000 mg | DELAYED_RELEASE_TABLET | Freq: Every day | ORAL | Status: DC
  Administered 2012-04-30 – 2012-05-03 (×4): 40 mg via ORAL
  Filled 2012-04-30 (×4): qty 1

## 2012-04-30 MED ORDER — TEMAZEPAM 15 MG PO CAPS: 15.0000 mg | ORAL_CAPSULE | Freq: Every evening | ORAL | Status: DC | PRN

## 2012-04-30 MED ORDER — SODIUM CHLORIDE 0.9 % IV BOLUS (SEPSIS)
1000.0000 mL | Freq: Once | INTRAVENOUS | Status: AC
Start: 2012-04-30 — End: 2012-04-30
  Administered 2012-04-30: 1000 mL via INTRAVENOUS

## 2012-04-30 MED ORDER — WARFARIN SODIUM 2.5 MG PO TABS
2.5000 mg | ORAL_TABLET | Freq: Once | ORAL | Status: AC
  Administered 2012-04-30: 2.5 mg via ORAL
  Filled 2012-04-30: qty 1

## 2012-04-30 MED ORDER — LOPERAMIDE HCL 1 MG/5ML PO LIQD: 1.0000 mg | ORAL | Status: DC | PRN

## 2012-04-30 MED ORDER — METOPROLOL SUCCINATE ER 50 MG PO TB24
50.0000 mg | ORAL_TABLET | Freq: Every day | ORAL | Status: DC
  Administered 2012-04-30 – 2012-05-03 (×4): 50 mg via ORAL
  Filled 2012-04-30 (×4): qty 1

## 2012-04-30 MED ORDER — ATORVASTATIN CALCIUM 10 MG PO TABS
10.0000 mg | ORAL_TABLET | Freq: Every day | ORAL | Status: DC
  Administered 2012-04-30 – 2012-05-03 (×4): 10 mg via ORAL
  Filled 2012-04-30 (×4): qty 1

## 2012-04-30 MED ORDER — DIGOXIN 125 MCG PO TABS
0.1250 mg | ORAL_TABLET | Freq: Every day | ORAL | Status: DC
  Administered 2012-04-30: 0.125 mg via ORAL
  Filled 2012-04-30 (×2): qty 1

## 2012-04-30 MED ORDER — VITAMIN C 500 MG PO TABS
1000.0000 mg | ORAL_TABLET | Freq: Every day | ORAL | Status: DC
  Administered 2012-04-30 – 2012-05-03 (×4): 1000 mg via ORAL
  Filled 2012-04-30 (×4): qty 2

## 2012-04-30 MED ORDER — ALBUTEROL SULFATE (5 MG/ML) 0.5% IN NEBU: 2.5000 mg | INHALATION_SOLUTION | RESPIRATORY_TRACT | Status: AC | PRN

## 2012-04-30 MED ORDER — ALUM & MAG HYDROXIDE-SIMETH 200-200-20 MG/5ML PO SUSP: 30.0000 mL | Freq: Four times a day (QID) | ORAL | Status: DC | PRN

## 2012-04-30 MED ORDER — DEXTROSE 5 % IV SOLN
2.0000 g | Freq: Once | INTRAVENOUS | Status: AC
  Administered 2012-04-30: 2 g via INTRAVENOUS
  Filled 2012-04-30: qty 2

## 2012-04-30 MED ORDER — DEXTROSE IN LACTATED RINGERS 5 % IV SOLN
INTRAVENOUS | Status: DC
  Administered 2012-04-30: 07:00:00 via INTRAVENOUS

## 2012-04-30 MED ORDER — BACID PO TABS
1.0000 | ORAL_TABLET | Freq: Every day | ORAL | Status: DC
  Administered 2012-04-30: 1 via ORAL
  Filled 2012-04-30 (×3): qty 1

## 2012-04-30 MED ORDER — CITALOPRAM HYDROBROMIDE 40 MG PO TABS
40.0000 mg | ORAL_TABLET | Freq: Every day | ORAL | Status: DC
  Administered 2012-04-30: 40 mg via ORAL
  Filled 2012-04-30 (×2): qty 1

## 2012-04-30 MED ORDER — HYDROCODONE-ACETAMINOPHEN 5-325 MG PO TABS
1.0000 | ORAL_TABLET | Freq: Four times a day (QID) | ORAL | Status: DC | PRN
  Administered 2012-04-30 – 2012-05-01 (×3): 1 via ORAL
  Filled 2012-04-30 (×3): qty 1

## 2012-04-30 MED ORDER — ONDANSETRON HCL 4 MG/2ML IJ SOLN: 4.0000 mg | Freq: Four times a day (QID) | INTRAMUSCULAR | Status: DC | PRN

## 2012-04-30 NOTE — ED Notes (Signed)
Attempted to start IV x2 unsuccessful. 2nd RN to assess and attempt [facility], [employer]

## 2012-04-30 NOTE — Progress Notes (Signed)
Patient in afib with occasional pauses and some bradycardia in the 30s (not sustained).  Patient on Toprol XL and Digoxin, both of which he got this morning.  Dr. Clelia Croft notified, new orders received.  Printed strips with pauses and placed in hardcopy chart.  Will continue to monitor.

## 2012-04-30 NOTE — Progress Notes (Signed)
ANTICOAGULATION CONSULT NOTE - Initial Consult  Pharmacy Consult for Warfarin Indication: atrial fibrillation  Allergies  Allergen Reactions  . Sulfonamide Derivatives     Patient Measurements: Height: 6' (182.9 cm) Weight: 164 lb 0.4 oz (74.4 kg) IBW/kg (Calculated) : 77.6  Vital Signs: Temp: 98.2 F (36.8 C) (03/10 0557) Temp src: Oral (03/10 0557) BP: 127/72 mmHg (03/10 0600) Pulse Rate: 72 (03/10 0600)  Labs:  Recent Labs  04/30/12 0118  HGB 14.1  HCT 43.2  PLT 167  LABPROT 24.1*  INR 2.28*  CREATININE 1.65*    Estimated Creatinine Clearance: 38.2 ml/min (by C-G formula based on Cr of 1.65).   Medical History: Past Medical History  Diagnosis Date  . Bladder cancer   . Atrial fibrillation, permanent   . Lung cancer     Status post surgery resection  . Hyperlipidemia   . Hypertension   . UTI (urinary tract infection)   . Dementia   . Hyperlipidemia     Assessment: 20 yoM admitted for fall and confusion, on chronic coumadin for atrial fibrillation and hx stroke. Found to have UTI and ARF in ED with transient hypotension. Head CT negative for abnormalities.  Patient c/o back pain and Xray scheduled to r/o compression fracture.  Pharmacy asked to resume dosing of coumadin.     Home dose coumadin: 2.5mg  every day except 5mg  T/R. Last dose 3/8 @ 2100  INR 2.28, CBC ok, no bleeding noted  Cardiac diet started  No DDIs noted  Goal of Therapy:  INR 2-3 Monitor platelets by anticoagulation protocol: Yes   Plan:  1.  Coumadin 2.5mg  po x 1  2.  Daily PT/INR  Haynes Hoehn, PharmD 04/30/2012 8:05 AM  Pager: [phone]
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
## 2012-04-30 NOTE — Progress Notes (Signed)
ANTIBIOTIC CONSULT NOTE - INITIAL  Pharmacy Consult for Cefepime Indication: rule out sepsis  Allergies  Allergen Reactions  . Sulfonamide Derivatives     Patient Measurements: Height: 6' (182.9 cm) Weight: 204 lb (92.534 kg) IBW/kg (Calculated) : 77.6 Adjusted Body Weight:   Vital Signs: Temp: 99.3 F (37.4 C) (03/09 2256) Temp src: Oral (03/09 2256) BP: 96/60 mmHg (03/09 2256) Pulse Rate: 59 (03/09 2256) Intake/Output from previous day:   Intake/Output from this shift:    Labs:  Recent Labs  04/30/12 0118  WBC 18.4*  HGB 14.1  PLT 167  CREATININE 1.65*   Estimated Creatinine Clearance: 39.8 ml/min (by C-G formula based on Cr of 1.65). No results found for this basename: VANCOTROUGH, VANCOPEAK, VANCORANDOM, GENTTROUGH, GENTPEAK, GENTRANDOM, TOBRATROUGH, TOBRAPEAK, TOBRARND, AMIKACINPEAK, AMIKACINTROU, AMIKACIN,  in the last 72 hours   Microbiology: No results found for this or any previous visit (from the past 720 hour(s)).  Medical History: Past Medical History  Diagnosis Date  . Bladder cancer   . Atrial fibrillation, permanent   . Lung cancer     Status post surgery resection  . Hyperlipidemia   . Hypertension   . UTI (urinary tract infection)   . Dementia   . Hyperlipidemia     Medications:  Anti-infectives   Start     Dose/Rate Route Frequency Ordered Stop   04/30/12 2200  ceFEPIme (MAXIPIME) 2 g in dextrose 5 % 50 mL IVPB     2 g 100 mL/hr over 30 Minutes Intravenous Daily at bedtime 04/30/12 0257     04/30/12 0015  ceFEPIme (MAXIPIME) 2 g in dextrose 5 % 50 mL IVPB     2 g 100 mL/hr over 30 Minutes Intravenous  Once 04/30/12 0009 04/30/12 0211     Assessment: Patient with sepsis.  First dose of antibiotics already given in ED.  Patient with reduced renal function.  Goal of Therapy:  Cefepime dosed based on patient weight and renal function   Plan:  Follow up culture results Cefepime 2gm iv q24hr   Darlina Guys, Julian  C[place]ford 04/30/2012,2:58 AM

## 2012-04-30 NOTE — Clinical Social Work Psychosocial (Signed)
Clinical Social Work Department BRIEF PSYCHOSOCIAL ASSESSMENT 04/30/2012  Patient:  JADYN, BARGE     Account Number:  [id number]     Admit date:  04/29/2012  Clinical Social Worker:  Jodelle Red  Date/Time:  04/30/2012 12:02 PM  Referred by:  CSW  Date Referred:  04/30/2012  Other Referral:   Interview type:  Patient Other interview type:    PSYCHOSOCIAL DATA Living Status:  FACILITY Admitted from facility:  [facility] Level of care:  Assisted Living Primary support name:  HEIDI ROURA Primary support relationship to patient:  CHILD, ADULT Degree of support available:   GOOD    CURRENT CONCERNS Current Concerns  Other - See comment   Other Concerns:   RETURN TO ALF At d/c    SOCIAL WORK ASSESSMENT / PLAN CSW met with Pt, he reports to live at ALF, [facility].  He plans to return there at discharge. CSW to update FL2 and follow for return to ALF. Pt reports he was doing well at his ALF prior to d/c.   Assessment/plan status:  Psychosocial Support/Ongoing Assessment of Needs Other assessment/ plan:   assist with return to ALF   Information/referral to community resources:    PATIENT'S/FAMILY'S RESPONSE TO PLAN OF CARE: Pt plans to return to [facility]. CSW to follow and assist with return.        Doreen Salvage, LCSW ICU/Stepdown Clinical Social Worker [facility] Cell [phone] Hours 8am-1200pm M-F

## 2012-04-30 NOTE — ED Notes (Signed)
[name] [phone]

## 2012-04-30 NOTE — ED Provider Notes (Signed)
History     CSN: [id number]  Arrival date & time 04/29/12  2246   First MD Initiated Contact with Patient 04/29/12 2358      Chief Complaint  Patient presents with  . Fall    (Consider location/radiation/quality/duration/timing/severity/associated sxs/prior treatment) HPI [name] is a 79 y.o. male w/PMH bladder CA s/p treatment who self caths, recurrent UTI poresents with fall, gait disturbance for 2 days.  Problem is severe, worsening, no alleviating factors, it does help when he doesn't walk.  He veers to the right when embulating, this has happened previously w/ UTI.  Had subjective chills and fever to 101 earlier today.  Denies chest pain, shortness of breath, abdominal pain. Daughter asserts he is "off."    Past Medical History  Diagnosis Date  . Bladder cancer   . Atrial fibrillation, permanent   . Lung cancer     Status post surgery resection  . Hyperlipidemia   . Hypertension   . UTI (urinary tract infection)   . Dementia   . Hyperlipidemia     History reviewed. No pertinent past surgical history.  History reviewed. No pertinent family history.  History  Substance Use Topics  . Smoking status: Former Smoker    Quit date: 10/10/1991  . Smokeless tobacco: Not on file  . Alcohol Use: Not on file      Review of Systems At least 10pt or greater review of systems completed and are negative except where specified in the HPI.  Allergies  Sulfonamide derivatives  Home Medications   Current Outpatient Rx  Name  Route  Sig  Dispense  Refill  . ascorbic acid (VITAMIN C) 1000 MG tablet   Oral   Take 1,000 mg by mouth daily.         Marland Kitchen atorvastatin (LIPITOR) 10 MG tablet   Oral   Take 10 mg by mouth daily.         . calcium carbonate (OS-CAL - DOSED IN MG OF ELEMENTAL CALCIUM) 1250 MG tablet   Oral   Take 1 tablet by mouth daily.         . cephALEXin (KEFLEX) 500 MG capsule   Oral   Take 500 mg by mouth daily. For self caths          . cholecalciferol (VITAMIN D) 1000 UNITS tablet   Oral   Take 1,000 Units by mouth daily.         . citalopram (CELEXA) 40 MG tablet   Oral   Take 40 mg by mouth daily.         . Cyanocobalamin (VITAMIN B 12 PO)   Oral   Take 500 mg by mouth 2 (two) times daily.         . digoxin (LANOXIN) 0.125 MG tablet   Oral   Take 0.125 mg by mouth daily.         Marland Kitchen esomeprazole (NEXIUM) 40 MG capsule   Oral   Take 40 mg by mouth daily as needed. For acid reflux         . HYDROcodone-acetaminophen (NORCO/VICODIN) 5-325 MG per tablet   Oral   Take 1 tablet by mouth every 6 (six) hours as needed for pain.   20 tablet   0   . lactobacillus acidophilus (BACID) TABS   Oral   Take 1 tablet by mouth daily.         Marland Kitchen loperamide (IMODIUM) 2 MG capsule   Oral   Take 1 mg  by mouth daily as needed. For diarrhea         . metoprolol succinate (TOPROL-XL) 50 MG 24 hr tablet   Oral   Take 50 mg by mouth daily. Take with or immediately following a meal.         . temazepam (RESTORIL) 15 MG capsule   Oral   Take 15 mg by mouth at bedtime as needed. For sleep         . vitamin E 100 UNIT capsule   Oral   Take 100 Units by mouth daily.         Marland Kitchen warfarin (COUMADIN) 5 MG tablet   Oral   Take 2.5-5 mg by mouth daily. 2.5 on M,W,F,Sat,SUN.  5mg  on Tuesday and Thursday           BP 96/60  Pulse 59  Temp(Src) 99.3 F (37.4 C) (Oral)  Resp 22  SpO2 96%  Physical Exam  Nursing notes reviewed.  Electronic medical record reviewed. VITAL SIGNS:   Filed Vitals:   05/01/12 0000 05/01/12 0200 05/01/12 0400 05/01/12 0600  BP: 143/83 147/59  144/81  Pulse: 59 75  43  Temp: 98.1 F (36.7 C)  98.8 F (37.1 C)   TempSrc: Oral  Oral   Resp: 22 22  22   Height:      Weight:      SpO2: 95% 93%  58%   CONSTITUTIONAL: Awake, oriented, appears non-toxic HENT: Atraumatic, normocephalic, oral mucosa pink and moist, airway patent. Nares patent without drainage. External ears  normal. EYES: Conjunctiva clear, EOMI, PERRLA NECK: Trachea midline, non-tender, supple CARDIOVASCULAR: Normal heart rate, Normal rhythm, No murmurs, rubs, gallops PULMONARY/CHEST: Clear to auscultation, no rhonchi, wheezes, or rales. Symmetrical breath sounds. Non-tender. ABDOMINAL: Non-distended, soft, non-tender - no rebound or guarding.  BS normal. NEUROLOGIC: Non-focal, moving all four extremities, no gross sensory or motor deficits. Did not walk pt in ER for safety. EXTREMITIES: No clubbing, cyanosis, or edema SKIN: Warm, Dry, No erythema, No rash  ED Course  Procedures (including critical care time)  Labs Reviewed  URINALYSIS, MICROSCOPIC ONLY - Abnormal; Notable for the following:    APPearance TURBID (*)    Hgb urine dipstick LARGE (*)    Protein, ur 100 (*)    Leukocytes, UA LARGE (*)    Bacteria, UA MANY (*)    All other components within normal limits  DIGOXIN LEVEL - Abnormal; Notable for the following:    Digoxin Level 0.6 (*)    All other components within normal limits  CBC WITH DIFFERENTIAL - Abnormal; Notable for the following:    WBC 18.4 (*)    Neutrophils Relative 85 (*)    Neutro Abs 15.6 (*)    Lymphocytes Relative 3 (*)    Lymphs Abs 0.6 (*)    Monocytes Absolute 2.1 (*)    All other components within normal limits  COMPREHENSIVE METABOLIC PANEL - Abnormal; Notable for the following:    Glucose, Bld 126 (*)    BUN 31 (*)    Creatinine, Ser 1.65 (*)    Albumin 3.3 (*)    GFR calc non Af Amer 38 (*)    GFR calc Af Amer 44 (*)    All other components within normal limits  PROTIME-INR - Abnormal; Notable for the following:    Prothrombin Time 24.1 (*)    INR 2.28 (*)    All other components within normal limits  CBC - Abnormal; Notable for the following:  WBC 12.7 (*)    RBC 4.18 (*)    Hemoglobin 12.8 (*)    HCT 38.3 (*)    Platelets 134 (*)    All other components within normal limits  BASIC METABOLIC PANEL - Abnormal; Notable for the  following:    Glucose, Bld 145 (*)    GFR calc non Af Amer 54 (*)    GFR calc Af Amer 63 (*)    All other components within normal limits  PROTIME-INR - Abnormal; Notable for the following:    Prothrombin Time 21.1 (*)    INR 1.90 (*)    All other components within normal limits  MRSA PCR SCREENING  URINE CULTURE  CULTURE, BLOOD (ROUTINE X 2)  CULTURE, BLOOD (ROUTINE X 2)  LACTIC ACID, PLASMA  PROCALCITONIN   Dg Lumbar Spine 2-3 Views  04/30/2012  *RADIOLOGY REPORT*  Clinical Data: Low back pain.  Multiple falls.  LUMBAR SPINE - 2-3 VIEW  Comparison: CT scan of the abdomen and pelvis dated 04/28/2009  Findings: The patient has fairly severe degenerative disc disease at L1-2, L3-4, L4-5, and L5-S1 with degenerative changes of the endplates.  There is no fracture or bone destruction.  Minimal degenerative changes of the facet joints.  Extensive calcification in the abdominal aorta and iliac arteries.  IMPRESSION: No acute abnormality.  Multilevel fairly severe degenerative disc disease.   Original Report Authenticated By: Francene Boyers, M.D.    Ct Head Wo Contrast  04/30/2012  *RADIOLOGY REPORT*  Clinical Data: Larey Seat 2 days ago and again tonight, history of falls, bladder cancer, lung cancer, hypertension, dementia  CT HEAD WITHOUT CONTRAST  Technique:  Contiguous axial images were obtained from the base of the skull through the vertex without contrast.  Comparison: CT head 04/12/2005 Correlation:  MRI brain 12/24/2008  Findings: Generalized atrophy. Normal ventricular morphology. No midline shift or mass effect. Small vessel chronic ischemic changes of deep cerebral white matter. No intracranial hemorrhage, mass lesion, or acute infarction. Visualized paranasal sinuses and mastoid air cells clear. Bones unremarkable. Atherosclerotic calcifications at skull base. Prominent basilar artery again noted.  IMPRESSION: Atrophy with small vessel chronic ischemic changes of deep cerebral white matter. No  acute intracranial abnormalities.   Original Report Authenticated By: Ulyses Southward, M.D.      1. UTI (lower urinary tract infection)   2. Fall, initial encounter   3. SIRS (systemic inflammatory response syndrome)    Medications  ceFEPIme (MAXIPIME) 2 g in dextrose 5 % 50 mL IVPB (2 g Intravenous Given 04/30/12 2225)  albuterol (PROVENTIL) (5 MG/ML) 0.5% nebulizer solution 2.5 mg (not administered)  HYDROmorphone (DILAUDID) injection 0.5 mg (not administered)  acetaminophen (TYLENOL) tablet 650 mg (not administered)  calcium carbonate (OS-CAL - dosed in mg of elemental calcium) tablet 500 mg of elemental calcium (500 mg of elemental calcium Oral Given 04/30/12 0940)  metoprolol succinate (TOPROL-XL) 24 hr tablet 50 mg (50 mg Oral Given 04/30/12 0941)  vitamin C (ASCORBIC ACID) tablet 1,000 mg (1,000 mg Oral Given 04/30/12 0942)  HYDROcodone-acetaminophen (NORCO/VICODIN) 5-325 MG per tablet 1 tablet (1 tablet Oral Given 05/01/12 0222)  lactobacillus acidophilus (BACID) tablet 1 tablet (1 tablet Oral Given 04/30/12 0941)  atorvastatin (LIPITOR) tablet 10 mg (10 mg Oral Given 04/30/12 0940)  cholecalciferol (VITAMIN D) tablet 1,000 Units (1,000 Units Oral Given 04/30/12 0940)  pantoprazole (PROTONIX) EC tablet 40 mg (40 mg Oral Given 04/30/12 0941)  loperamide (IMODIUM) 1 MG/5ML solution 1 mg (not administered)  temazepam (RESTORIL) capsule 15 mg (  not administered)  vitamin E capsule 100 Units (100 Units Oral Given 04/30/12 0942)  0.9 %  sodium chloride infusion ( Intravenous New Bag/Given 05/01/12 0108)  acetaminophen (TYLENOL) tablet 650 mg (not administered)    Or  acetaminophen (TYLENOL) suppository 650 mg (not administered)  ondansetron (ZOFRAN) tablet 4 mg (not administered)    Or  ondansetron (ZOFRAN) injection 4 mg (not administered)  alum & mag hydroxide-simeth (MAALOX/MYLANTA) 200-200-20 MG/5ML suspension 30 mL (not administered)  Warfarin - Pharmacist Dosing Inpatient ( Does not apply  Duplicate 04/30/12 1800)  antiseptic oral rinse (BIOTENE) solution 15 mL (15 mLs Mouth Rinse Given 05/01/12 0800)  digoxin (LANOXIN) tablet 0.125 mg (not administered)  citalopram (CELEXA) tablet 20 mg (not administered)  ceFEPIme (MAXIPIME) 2 g in dextrose 5 % 50 mL IVPB (0 g Intravenous Stopped 04/30/12 0211)  sodium chloride 0.9 % bolus 1,000 mL (1,000 mLs Intravenous New Bag/Given 04/30/12 0127)  warfarin (COUMADIN) tablet 2.5 mg (2.5 mg Oral Given 04/30/12 1735)      MDM  Joyice Faster is a 79 y.o. male presents non-toxic, but with concern for UTI/SIRS/Sepsis.  Doubt sepsis at this time, no septic physiology,  He is non-toxic.  Check labs, UA, r/o PNA, CT head r/o bleed, hydroceph.  Labs c/w UTI, SIRS criteria met - pt sef caths, and h/o prior UTI, treat broad spectrum.  D/W Dr. Clelia Croft for admission.  Placed holding orders.  Pt stable for admission.        Jones Skene, MD 05/01/12 [phone]

## 2012-05-01 DIAGNOSIS — M545 Low back pain: Secondary | ICD-10-CM | POA: Diagnosis not present

## 2012-05-01 DIAGNOSIS — N179 Acute kidney failure, unspecified: Secondary | ICD-10-CM | POA: Diagnosis not present

## 2012-05-01 DIAGNOSIS — N390 Urinary tract infection, site not specified: Secondary | ICD-10-CM | POA: Diagnosis not present

## 2012-05-01 LAB — BASIC METABOLIC PANEL
CO2: 22 mEq/L (ref 19–32)
Calcium: 8.9 mg/dL (ref 8.4–10.5)
Creatinine, Ser: 1.23 mg/dL (ref 0.50–1.35)
GFR calc non Af Amer: 54 mL/min — ABNORMAL LOW (ref >90)
Glucose, Bld: 145 mg/dL — ABNORMAL HIGH (ref 70–99)
Sodium: 137 mEq/L (ref 135–145)

## 2012-05-01 LAB — CBC
MCH: 30.6 pg (ref 26.0–34.0)
MCHC: 33.4 g/dL (ref 30.0–36.0)
MCV: 91.6 fL (ref 78.0–100.0)
Platelets: 134 10*3/uL — ABNORMAL LOW (ref 150–400)

## 2012-05-01 LAB — PROTIME-INR
INR: 1.9 — ABNORMAL HIGH (ref 0.00–1.49)
Prothrombin Time: 21.1 seconds — ABNORMAL HIGH (ref 11.6–15.2)

## 2012-05-01 MED ORDER — LACTINEX PO CHEW
1.0000 | CHEWABLE_TABLET | Freq: Every day | ORAL | Status: DC
  Administered 2012-05-01 – 2012-05-03 (×3): 1 via ORAL
  Filled 2012-05-01 (×3): qty 1

## 2012-05-01 MED ORDER — WARFARIN SODIUM 5 MG PO TABS
5.0000 mg | ORAL_TABLET | Freq: Once | ORAL | Status: AC
  Administered 2012-05-01: 5 mg via ORAL
  Filled 2012-05-01 (×2): qty 1

## 2012-05-01 MED ORDER — DEXTROSE 5 % IV SOLN
2.0000 g | Freq: Two times a day (BID) | INTRAVENOUS | Status: DC
  Administered 2012-05-01 – 2012-05-02 (×4): 2 g via INTRAVENOUS
  Filled 2012-05-01 (×6): qty 2

## 2012-05-01 MED ORDER — MORPHINE SULFATE 2 MG/ML IJ SOLN
2.0000 mg | INTRAMUSCULAR | Status: DC | PRN
  Administered 2012-05-01: 2 mg via INTRAVENOUS
  Filled 2012-05-01: qty 1

## 2012-05-01 MED ORDER — DIGOXIN 125 MCG PO TABS: 0.1250 mg | ORAL_TABLET | ORAL | Status: DC

## 2012-05-01 MED ORDER — CITALOPRAM HYDROBROMIDE 20 MG PO TABS
20.0000 mg | ORAL_TABLET | Freq: Every day | ORAL | Status: DC
  Administered 2012-05-01 – 2012-05-03 (×3): 20 mg via ORAL
  Filled 2012-05-01 (×3): qty 1

## 2012-05-01 NOTE — Progress Notes (Signed)
ANTIBIOTIC CONSULT NOTE - Follow-up  Pharmacy Consult for Cefepime Indication: rule out sepsis/UTI  Allergies  Allergen Reactions  . Sulfonamide Derivatives     Patient Measurements: Height: 6' (182.9 cm) Weight: 164 lb 0.4 oz (74.4 kg) IBW/kg (Calculated) : 77.6 Adjusted Body Weight:   Vital Signs: Temp: 98.8 F (37.1 C) (03/11 0400) Temp src: Oral (03/11 0400) BP: 101/52 mmHg (03/11 0800) Pulse Rate: 64 (03/11 0800) Intake/Output from previous day: 03/10 0701 - 03/11 0700 In: 2270 [P.O.:420; I.V.:1800; IV Piggyback:50] Out: 1225 [Urine:1225] Intake/Output from this shift: Total I/O In: 75 [I.V.:75] Out: 300 [Urine:300]  Labs:  Recent Labs  04/30/12 0118 05/01/12 0339  WBC 18.4* 12.7*  HGB 14.1 12.8*  PLT 167 134*  CREATININE 1.65* 1.23   Estimated Creatinine Clearance: 51.2 ml/min (by C-G formula based on Cr of 1.23). No results found for this basename: VANCOTROUGH, Leodis Binet, VANCORANDOM, GENTTROUGH, GENTPEAK, GENTRANDOM, TOBRATROUGH, TOBRAPEAK, TOBRARND, AMIKACINPEAK, AMIKACINTROU, AMIKACIN,  in the last 72 hours   Microbiology: Recent Results (from the past 720 hour(s))  MRSA PCR SCREENING     Status: None   Collection Time    04/30/12  5:46 AM      Result Value Range Status   MRSA by PCR NEGATIVE  NEGATIVE Final   Comment:            The GeneXpert MRSA Assay (FDA     approved for NASAL specimens     only), is one component of a     comprehensive MRSA colonization     surveillance program. It is not     intended to diagnose MRSA     infection nor to guide or     monitor treatment for     MRSA infections.  CULTURE, BLOOD (ROUTINE X 2)     Status: None   Collection Time    04/30/12  7:58 AM      Result Value Range Status   Specimen Description BLOOD LEFT ARM   Final   Special Requests BOTTLES DRAWN AEROBIC AND ANAEROBIC [facility] EACH   Final   Culture  Setup Time 04/30/2012 14:12   Final   Culture     Final   Value:        BLOOD CULTURE RECEIVED NO  GROWTH TO DATE CULTURE WILL BE HELD FOR 5 DAYS BEFORE ISSUING A FINAL NEGATIVE REPORT   Report Status PENDING   Incomplete  CULTURE, BLOOD (ROUTINE X 2)     Status: None   Collection Time    04/30/12  8:00 AM      Result Value Range Status   Specimen Description BLOOD RIGHT HAND   Final   Special Requests BOTTLES DRAWN AEROBIC AND ANAEROBIC 10CC EACH   Final   Culture  Setup Time 04/30/2012 14:12   Final   Culture     Final   Value:        BLOOD CULTURE RECEIVED NO GROWTH TO DATE CULTURE WILL BE HELD FOR 5 DAYS BEFORE ISSUING A FINAL NEGATIVE REPORT   Report Status PENDING   Incomplete    Medical History: Past Medical History  Diagnosis Date  . Atrial fibrillation, permanent   . Hyperlipidemia   . Hypertension   . UTI (urinary tract infection)   . Dementia   . Hyperlipidemia   . Bladder cancer     hx of surgery and chemo  . Lung cancer     Status post surgery resection    Medications:  Anti-infectives  Start     Dose/Rate Route Frequency Ordered Stop   05/01/12 1000  ceFEPIme (MAXIPIME) 2 g in dextrose 5 % 50 mL IVPB     2 g 100 mL/hr over 30 Minutes Intravenous Every 12 hours 05/01/12 0851     04/30/12 2200  ceFEPIme (MAXIPIME) 2 g in dextrose 5 % 50 mL IVPB  Status:  Discontinued     2 g 100 mL/hr over 30 Minutes Intravenous Daily at bedtime 04/30/12 0257 05/01/12 0851   04/30/12 0015  ceFEPIme (MAXIPIME) 2 g in dextrose 5 % 50 mL IVPB     2 g 100 mL/hr over 30 Minutes Intravenous  Once 04/30/12 0009 04/30/12 0211     Assessment: 81 yoM on D#2 cefepime for sepsis and UTI.    Renal function improving, CrCl ~ 51 ml/min  WBC trending down, now 12.7  Afebrile  Cultures:  Blood - NGTD, Urine - pending  Goal of Therapy:  Cefepime dosed based on patient weight and renal function  Plan:  Increase cefepime to 2grams IV q 12 hours F/u cultures, renal fxn, WBC, T, clinical course  Summe, Colleen E 05/01/2012,8:52 AM

## 2012-05-01 NOTE — Clinical Social Work Placement (Signed)
Clinical Social Work Department CLINICAL SOCIAL WORK PLACEMENT NOTE 05/01/2012  Patient:  [name], [name]  Account Number:  [id number] Admit date:  04/29/2012  Clinical Social Worker:  Jodelle Red  Date/time:  05/01/2012 11:41 AM  Clinical Social Work is seeking post-discharge placement for this patient at the following level of care:   SKILLED NURSING   (*CSW will update this form in Epic as items are completed)   05/01/2012  Patient/family provided with [facility] Department of Clinical Social Work's list of facilities offering this level of care within the geographic area requested by the patient (or if unable, by the patient's family).  05/01/2012  Patient/family informed of their freedom to choose among providers that offer the needed level of care, that participate in Medicare, Medicaid or managed care program needed by the patient, have an available bed and are willing to accept the patient.  05/01/2012  Patient/family informed of [facility]' ownership interest in [facility], as well as of the fact that they are under no obligation to receive care at this facility.  PASARR submitted to EDS on  PASARR number received from EDS on   FL2 transmitted to all facilities in geographic area requested by pt/family on   FL2 transmitted to all facilities within larger geographic area on   Patient informed that his/her managed care company has contracts with or will negotiate with  certain facilities, including the following:     Patient/family informed of bed offers received:   Patient chooses bed at  Physician recommends and patient chooses bed at    Patient to be transferred to  on   Patient to be transferred to facility by   The following physician request were entered in Epic:   Additional Comments:  Doreen Salvage, LCSW ICU/Stepdown Clinical Social Worker [facility] Cell [phone] Hours 8am-1200pm M-F

## 2012-05-01 NOTE — Progress Notes (Signed)
Subjective: No complaints.  No back pain.  Reports intermittent confusion at baseline.  Endorses some depression.  Objective: Vital signs in last 24 hours: Temp:  [97.9 F (36.6 C)-99.3 F (37.4 C)] 98.8 F (37.1 C) (03/11 0400) Pulse Rate:  [43-80] 43 (03/11 0600) Resp:  [14-25] 22 (03/11 0600) BP: (94-147)/(58-83) 144/81 mmHg (03/11 0600) SpO2:  [58 %-97 %] 58 % (03/11 0600) Weight change:     CBG (last 3)  No results found for this basename: GLUCAP,  in the last 72 hours  Intake/Output from previous day: 03/10 0701 - 03/11 0700 In: 2270 [P.O.:420; I.V.:1800; IV Piggyback:50] Out: 1225 [Urine:1225] Intake/Output this shift: Total I/O In: -  Out: 300 [Urine:300]  General appearance: alert and no distress Eyes: no scleral icterus Throat: oropharynx moist without erythema Resp: clear to auscultation bilaterally Cardio: irregularly irregular rhythm; bradycardic with rates into 30s on telemetry GI: soft, non-tender; bowel sounds normal; no masses,  no organomegaly Extremities: no clubbing, cyanosis or edema   Lab Results:  Recent Labs  04/30/12 0118 05/01/12 0339  NA 135 137  K 4.5 3.8  CL 99 104  CO2 23 22  GLUCOSE 126* 145*  BUN 31* 23  CREATININE 1.65* 1.23  CALCIUM 9.7 8.9    Recent Labs  04/30/12 0118  AST 34  ALT 15  ALKPHOS 53  BILITOT 0.9  PROT 6.7  ALBUMIN 3.3*    Recent Labs  04/30/12 0118 05/01/12 0339  WBC 18.4* 12.7*  NEUTROABS 15.6*  --   HGB 14.1 12.8*  HCT 43.2 38.3*  MCV 92.9 91.6  PLT 167 134*   Lab Results  Component Value Date   INR 1.90* 05/01/2012   INR 2.28* 04/30/2012   INR 1.37 12/30/2011   PROTIME 22.8* 10/17/2011   PROTIME 39.6* 10/10/2011   PROTIME 16.5 08/06/2008   No results found for this basename: CKTOTAL, CKMB, CKMBINDEX, TROPONINI,  in the last 72 hours No results found for this basename: TSH, T4TOTAL, FREET3, T3FREE, THYROIDAB,  in the last 72 hours No results found for this basename: VITAMINB12, FOLATE,  FERRITIN, TIBC, IRON, RETICCTPCT,  in the last 72 hours  Studies/Results: Dg Lumbar Spine 2-3 Views  04/30/2012  *RADIOLOGY REPORT*  Clinical Data: Low back pain.  Multiple falls.  LUMBAR SPINE - 2-3 VIEW  Comparison: CT scan of the abdomen and pelvis dated 04/28/2009  Findings: The patient has fairly severe degenerative disc disease at L1-2, L3-4, L4-5, and L5-S1 with degenerative changes of the endplates.  There is no fracture or bone destruction.  Minimal degenerative changes of the facet joints.  Extensive calcification in the abdominal aorta and iliac arteries.  IMPRESSION: No acute abnormality.  Multilevel fairly severe degenerative disc disease.   Original Report Authenticated By: Francene Boyers, M.D.    Ct Head Wo Contrast  04/30/2012  *RADIOLOGY REPORT*  Clinical Data: Larey Seat 2 days ago and again tonight, history of falls, bladder cancer, lung cancer, hypertension, dementia  CT HEAD WITHOUT CONTRAST  Technique:  Contiguous axial images were obtained from the base of the skull through the vertex without contrast.  Comparison: CT head 04/12/2005 Correlation:  MRI brain 12/24/2008  Findings: Generalized atrophy. Normal ventricular morphology. No midline shift or mass effect. Small vessel chronic ischemic changes of deep cerebral white matter. No intracranial hemorrhage, mass lesion, or acute infarction. Visualized paranasal sinuses and mastoid air cells clear. Bones unremarkable. Atherosclerotic calcifications at skull base. Prominent basilar artery again noted.  IMPRESSION: Atrophy with small vessel chronic ischemic changes  of deep cerebral white matter. No acute intracranial abnormalities.   Original Report Authenticated By: Ulyses Southward, M.D.      Medications: Scheduled: . antiseptic oral rinse  15 mL Mouth Rinse BID  . atorvastatin  10 mg Oral Daily  . calcium carbonate  1 tablet Oral Daily  . ceFEPime (MAXIPIME) IV  2 g Intravenous QHS  . cholecalciferol  1,000 Units Oral Daily  . citalopram   40 mg Oral Daily  . digoxin  0.125 mg Oral Daily  . lactobacillus acidophilus  1 tablet Oral Daily  . metoprolol succinate  50 mg Oral Daily  . pantoprazole  40 mg Oral Daily  . ascorbic acid  1,000 mg Oral Daily  . vitamin E  100 Units Oral Daily  . Warfarin - Pharmacist Dosing Inpatient   Does not apply q1800   Continuous: . sodium chloride 75 mL/hr at 05/01/12 0108  . dextrose 5% lactated ringers 75 mL/hr at 04/30/12 4540    Assessment/Plan: Principal Problem:  1. SIRS (systemic inflammatory response syndrome) secondary to UTI- transient hypotension, acute renal failure, leukocytosis all likely secondary to UTI. Leukocytosis improved, afebrile.  Continue Cefepime for 24 more hours pending culture data then transition to oral option if stable.  Active Problems:  2. Acute Renal Failure-improved with IV fluid hydration.  3. HYPERTENSION, BENIGN- we'll continue Toprol and monitor blood pressure. Hold for blood pressure less than 100/50.  4. Atrial fibrillation- bradycardic with rates into 30s-40s. Will discontinue Digoxin and continue Bblocker for rate control.  Continue anticoagulation for now though fall risk may eventually make this too risky. 5. Recurrent Falls- PT/OT evaluation. May be secondary to orthostasis in the setting of his UTI though he does have gait instability due to peripheral neuropathy.  6. Low back pain- Lumbar x-ray negative for fracture. 7. Disposition- anticipate discharge in 1-2 days. He intends to return to his daughter's house prior to returning to [facility].  Transfer to floor today.    LOS: 2 days   SHAW,W DOUGLAS 05/01/2012, 7:57 AM

## 2012-05-01 NOTE — Clinical Social Work Note (Signed)
CSW revisited issue of possible d/c needs after PT consult today. PT and OT both feel Pt needs SNF. CSW explained this to Pt. He reports he plans to go home with his daughter. CSW explained that he was a 2 assist when he worked with PT. He again reports he plans to go home with daughter. Pt agreed to CSW contacting daughter.  CSW spoke with daughter at length. She reports she hurt her back when Pt fell and has MS. She reports she and her husband cannot care for Pt at home. They would like to pursue SNF and will call a family meeting to figure out a plan about how to tell Pt and get him to agree. Daughter reports she hopes he agrees, but may decide to take him home if not.   CSW left SNF list and will begin SNF search. It will be helpful if staff continue to educate Pt about SNF and how this is best choice for his recovery.   Doreen Salvage, LCSW ICU/Stepdown Clinical Social Worker [facility] Cell [phone] Hours 8am-1200pm M-F

## 2012-05-01 NOTE — Evaluation (Signed)
Physical Therapy Evaluation Patient Details Name: [name] MRN: [id number] DOB: Oct 07, 1930 Today's Date: 05/01/2012 Time: 4098-1191 PT Time Calculation (min): 31 min  PT Assessment / Plan / Recommendation Clinical Impression  Pt. is 79 yo male admitted 04/29/12 after several falls while staying with daughter. Pt. dx with SIRS, hypotension/bradycardia. Pt lives independently at [facility]. Pt reports several falls, ambulates w/ 4 wheeled RW. Discussed w/ pt. that post DC rehab is reommende. Pt. does not acknowledge the need for assistance after DC. Recommend CIR consult, if not a candidate then SNF unless daughter is available  24/7.    PT Assessment  Patient needs continued PT services    Follow Up Recommendations  CIR;SNF    Does the patient have the potential to tolerate intense rehabilitation      Barriers to Discharge Decreased caregiver support      Equipment Recommendations  None recommended by PT    Recommendations for Other Services     Frequency Min 3X/week    Precautions / Restrictions Precautions Precautions: Fall Precaution Comments: festinating gait. Restrictions Weight Bearing Restrictions: No   Pertinent Vitals/Pain  sats >90 on RA while ambulating, placed back on 3 l O2 w/100% sats.  HR varied 49-67      Mobility  Bed Mobility Bed Mobility: Supine to Sit Supine to Sit: 4: Min guard Transfers Transfers: Sit to Stand;Stand to Sit Sit to Stand: From bed;With upper extremity assist;4: Min assist Stand to Sit: To chair/3-in-1;With armrests;With upper extremity assist Details for Transfer Assistance: cues for safety, push from recliner. Ambulation/Gait Ambulation/Gait Assistance: 1: +2 Total assist Ambulation/Gait: Patient Percentage: 70% Ambulation Distance (Feet): 20 Feet Assistive device: Rolling walker Ambulation/Gait Assistance Details: pt with significant gait disorder with festinating, steps out of RW, when turning does not stay within  RW. Requires frequent cues for increasing step length. At times, pt does  have a normal step length on both but reverts back to festinating. Gait Pattern: Step-to pattern;Festinating;Step-through pattern;Decreased step length - right;Decreased weight shift to left;Lateral hip instability Gait velocity: decreased    Exercises     PT Diagnosis: Abnormality of gait;Generalized weakness  PT Problem List: Decreased strength;Decreased activity tolerance;Decreased balance;Decreased mobility;Decreased coordination;Decreased knowledge of use of DME;Decreased safety awareness;Decreased knowledge of precautions;Cardiopulmonary status limiting activity PT Treatment Interventions: DME instruction;Gait training;Functional mobility training;Therapeutic activities;Therapeutic exercise;Balance training;Patient/family education   PT Goals Acute Rehab PT Goals PT Goal Formulation: With patient Time For Goal Achievement: 05/15/12 Potential to Achieve Goals: Good Pt will go Supine/Side to Sit: with modified independence PT Goal: Supine/Side to Sit - Progress: Goal set today Pt will go Sit to Supine/Side: with modified independence PT Goal: Sit to Supine/Side - Progress: Goal set today Pt will go Sit to Stand: with supervision PT Goal: Sit to Stand - Progress: Goal set today Pt will go Stand to Sit: with supervision PT Goal: Stand to Sit - Progress: Goal set today Pt will Ambulate: >150 feet;with supervision;with rolling walker PT Goal: Ambulate - Progress: Goal set today Pt will Perform Home Exercise Program: with supervision, verbal cues required/provided PT Goal: Perform Home Exercise Program - Progress: Goal set today  Visit Information  Last PT Received On: 05/01/12 Assistance Needed: +2    Subjective Data  Subjective: Dr. Clelia Croft said I could go home. tomorrow. Patient Stated Goal: go home   Prior Functioning  Home Living Lives With: Alone Available Help at Discharge: Family Type of Home:  Apartment ([place]) Home Access: Level entry Home Layout: One level Bathroom Shower/Tub: Walk-in  shower Bathroom Toilet: Handicapped height Home Adaptive Equipment: Grab bars in shower;Walker - four wheeled Additional Comments: was staying with daughter since [place], had several falls. Prior Function Level of Independence: Independent with assistive device(s) Driving: No Comments: dasughter assists with groceries, Per pt he cooks. Communication Communication: No difficulties    Cognition  Cognition Overall Cognitive Status: Impaired Area of Impairment: Safety/judgement;Awareness of errors;Awareness of deficits Arousal/Alertness: Awake/alert Orientation Level: Oriented X4 / Intact Behavior During Session: Flat affect Safety/Judgement: Decreased awareness of safety precautions;Decreased safety judgement for tasks assessed;Decreased awareness of need for assistance Awareness of Errors: Assistance required to identify errors made;Assistance required to correct errors made    Extremity/Trunk Assessment Right Lower Extremity Assessment RLE ROM/Strength/Tone: Deficits RLE ROM/Strength/Tone Deficits: grossly WFL for gait RLE Sensation: WFL - Light Touch Left Lower Extremity Assessment LLE ROM/Strength/Tone: Deficits LLE ROM/Strength/Tone Deficits: same as R LLE Sensation: WFL - Light Touch Trunk Assessment Trunk Assessment: Normal   Balance Balance Balance Assessed: Yes Static Sitting Balance Static Sitting - Balance Support: Bilateral upper extremity supported;Feet supported Static Sitting - Level of Assistance: 5: Stand by assistance Static Standing Balance Static Standing - Balance Support: Bilateral upper extremity supported Static Standing - Level of Assistance: 3: Mod assist Static Standing - Comment/# of Minutes: tends to list to the right.  End of Session PT - End of Session Equipment Utilized During Treatment: Gait belt Activity Tolerance: Patient tolerated treatment  well Patient left: in chair;with call bell/phone within reach Nurse Communication: Mobility status  GP     Rada Hay 05/01/2012, 10:22 AM [phone]

## 2012-05-01 NOTE — Progress Notes (Signed)
Rehab Admissions Coordinator Note:  Patient was screened by Trish Mage for appropriateness for an Inpatient Acute Rehab Consult.  At this time, we are recommending Skilled Nursing Facility.  Very limited bed availability on inpatient rehab at this time.  Therefore, I am recommending SNF level therapies.  Trish Mage 05/01/2012, 10:40 AM  I can be reached at [phone].

## 2012-05-01 NOTE — Progress Notes (Signed)
ANTICOAGULATION CONSULT NOTE - Initial Consult  Pharmacy Consult for Warfarin Indication: atrial fibrillation  Allergies  Allergen Reactions  . Sulfonamide Derivatives     Patient Measurements: Height: 6' (182.9 cm) Weight: 164 lb 0.4 oz (74.4 kg) IBW/kg (Calculated) : 77.6  Vital Signs: Temp: 98.8 F (37.1 C) (03/11 0400) Temp src: Oral (03/11 0400) BP: 101/52 mmHg (03/11 0800) Pulse Rate: 64 (03/11 0800)  Labs:  Recent Labs  04/30/12 0118 05/01/12 0339  HGB 14.1 12.8*  HCT 43.2 38.3*  PLT 167 134*  LABPROT 24.1* 21.1*  INR 2.28* 1.90*  CREATININE 1.65* 1.23    Estimated Creatinine Clearance: 51.2 ml/min (by C-G formula based on Cr of 1.23).   Medical History: Past Medical History  Diagnosis Date  . Atrial fibrillation, permanent   . Hyperlipidemia   . Hypertension   . UTI (urinary tract infection)   . Dementia   . Hyperlipidemia   . Bladder cancer     hx of surgery and chemo  . Lung cancer     Status post surgery resection    Assessment: 39 yoM admitted for fall and confusion, on chronic coumadin for atrial fibrillation and hx stroke- major residual deficit is gait instability. Found to have UTI and ARF in ED with transient hypotension. Head CT negative for abnormalities.  Xray neg for fracture.   Pharmacy has resumed coumadin dosing inpatient.   Home dose coumadin: 2.5mg  every day except 5mg  T/R. Last dose PTA was 3/8 @ 2100  INR small drop overnight after missed dose 3/9, CBC ok, no bleeding noted  Cardiac diet started  No DDIs noted  Goal of Therapy:  INR 2-3 Monitor platelets by anticoagulation protocol: Yes   Plan:  1.  Coumadin 5mg  po x 1  2.  Daily PT/INR  Haynes Hoehn, PharmD 05/01/2012 8:42 AM  Pager: [phone]

## 2012-05-01 NOTE — Evaluation (Signed)
Occupational Therapy Evaluation Patient Details Name: [name] MRN: [id number] DOB: Jul 06, 1930 Today's Date: 05/01/2012 Time: 1478-2956 OT Time Calculation (min): 21 min  OT Assessment / Plan / Recommendation Clinical Impression  Pt. is 79 yo male admitted 04/29/12 after several falls while staying with daughter. Pt. dx with SIRS, hypotension/bradycardia. Pt lives independently at [facility]. Pt reports several falls, ambulates w/ 4 wheeled RW. Discussed w/ pt. that post DC rehab is reommende. Pt. does not acknowledge the need for assistance after DC. Recommend CIR consult, if not a candidate then SNF unless daughter is available  24/7.    OT Assessment  Patient needs continued OT Services    Follow Up Recommendations  CIR          Recommendations for Other Services Rehab consult  Frequency  Min 2X/week    Precautions / Restrictions Precautions Precautions: Fall Precaution Comments: festinating gait. Restrictions Weight Bearing Restrictions: No       ADL  Eating/Feeding: Performed;Set up Where Assessed - Eating/Feeding: Chair Grooming: Simulated;Wash/dry face;Minimal assistance Where Assessed - Grooming: Unsupported sitting Upper Body Bathing: Simulated;Minimal assistance Where Assessed - Upper Body Bathing: Unsupported sitting Lower Body Bathing: Performed;Maximal assistance Where Assessed - Lower Body Bathing: Supported sit to stand Upper Body Dressing: Simulated;Minimal assistance Where Assessed - Upper Body Dressing: Unsupported sitting Lower Body Dressing: Performed;Maximal assistance Where Assessed - Lower Body Dressing: Supported sit to Pharmacist, hospital: Simulated;Maximal assistance Toilet Transfer Method: Sit to stand    OT Diagnosis: Generalized weakness  OT Problem List: Decreased strength;Decreased safety awareness;Impaired balance (sitting and/or standing) OT Treatment Interventions: Self-care/ADL training;Patient/family education   OT  Goals Acute Rehab OT Goals OT Goal Formulation: With patient Time For Goal Achievement: 05/15/12 ADL Goals Pt Will Perform Grooming: with supervision;Standing at sink ADL Goal: Grooming - Progress: Goal set today Pt Will Perform Upper Body Dressing: with supervision;Sit to stand from bed Pt Will Perform Lower Body Dressing: Sit to stand from bed ADL Goal: Lower Body Dressing - Progress: Goal set today Pt Will Transfer to Toilet: with supervision;Comfort height toilet ADL Goal: Toilet Transfer - Progress: Goal set today  Visit Information  Last OT Received On: 05/01/12 Assistance Needed: +2       Prior Functioning     Home Living Lives With: Alone Available Help at Discharge: Family Type of Home: Apartment ([place]) Home Access: Level entry Home Layout: One level Bathroom Shower/Tub: Health visitor: Handicapped height Home Adaptive Equipment: Grab bars in [employer] - four wheeled Additional Comments: was staying with daughter since [place], had several falls. Prior Function Level of Independence: Independent with assistive device(s) Driving: No Comments: dasughter assists with groceries, Per pt he cooks. Communication Communication: No difficulties         Vision/Perception Vision - History Patient Visual Report: No change from baseline   Cognition  Cognition Overall Cognitive Status: Impaired Area of Impairment: Safety/judgement;Awareness of errors;Awareness of deficits Arousal/Alertness: Awake/alert Orientation Level: Oriented X4 / Intact Behavior During Session: Flat affect Safety/Judgement: Decreased awareness of safety precautions;Decreased safety judgement for tasks assessed;Decreased awareness of need for assistance Awareness of Errors: Assistance required to identify errors made;Assistance required to correct errors made    Extremity/Trunk Assessment Right Upper Extremity Assessment RUE ROM/Strength/Tone: [facility] for tasks  assessed Left Upper Extremity Assessment LUE ROM/Strength/Tone: WFL for tasks assessed Right Lower Extremity Assessment RLE ROM/Strength/Tone: Deficits RLE ROM/Strength/Tone Deficits: grossly WFL for gait RLE Sensation: WFL - Light Touch Left Lower Extremity Assessment LLE ROM/Strength/Tone: Deficits LLE ROM/Strength/Tone Deficits: same as  R LLE Sensation: WFL - Light Touch Trunk Assessment Trunk Assessment: Normal     Mobility Bed Mobility Bed Mobility: Supine to Sit Supine to Sit: 4: Min guard Transfers Sit to Stand: From bed;With upper extremity assist;4: Min assist Stand to Sit: To chair/3-in-1;With armrests;With upper extremity assist Details for Transfer Assistance: cues for safety, push from recliner.        Balance Balance Balance Assessed: Yes Static Sitting Balance Static Sitting - Balance Support: Bilateral upper extremity supported;Feet supported Static Sitting - Level of Assistance: 5: Stand by assistance Static Standing Balance Static Standing - Balance Support: Bilateral upper extremity supported Static Standing - Level of Assistance: 3: Mod assist Static Standing - Comment/# of Minutes: tends to list to the right.   End of Session OT - End of Session Equipment Utilized During Treatment: Gait belt Left in chair with breakfast with call bell within reach       [facility], Metro Kung 05/01/2012, 10:31 AM

## 2012-05-02 ENCOUNTER — Inpatient Hospital Stay (HOSPITAL_COMMUNITY): Payer: Medicare Other

## 2012-05-02 DIAGNOSIS — N179 Acute kidney failure, unspecified: Secondary | ICD-10-CM | POA: Diagnosis not present

## 2012-05-02 DIAGNOSIS — N390 Urinary tract infection, site not specified: Secondary | ICD-10-CM | POA: Diagnosis not present

## 2012-05-02 DIAGNOSIS — J984 Other disorders of lung: Secondary | ICD-10-CM | POA: Diagnosis not present

## 2012-05-02 LAB — CBC
HCT: 38.2 % — ABNORMAL LOW (ref 39.0–52.0)
Hemoglobin: 12.6 g/dL — ABNORMAL LOW (ref 13.0–17.0)
MCH: 30.3 pg (ref 26.0–34.0)
MCHC: 33 g/dL (ref 30.0–36.0)
RDW: 14.9 % (ref 11.5–15.5)

## 2012-05-02 LAB — BASIC METABOLIC PANEL
BUN: 18 mg/dL (ref 6–23)
Calcium: 8.9 mg/dL (ref 8.4–10.5)
Creatinine, Ser: 1.17 mg/dL (ref 0.50–1.35)
GFR calc Af Amer: 67 mL/min — ABNORMAL LOW (ref >90)
GFR calc non Af Amer: 57 mL/min — ABNORMAL LOW (ref >90)
Glucose, Bld: 109 mg/dL — ABNORMAL HIGH (ref 70–99)
Potassium: 4.4 mEq/L (ref 3.5–5.1)

## 2012-05-02 LAB — URINE CULTURE: Colony Count: >=100000

## 2012-05-02 MED ORDER — IPRATROPIUM BROMIDE 0.02 % IN SOLN: 0.5000 mg | Freq: Two times a day (BID) | RESPIRATORY_TRACT | Status: DC

## 2012-05-02 MED ORDER — ALBUTEROL SULFATE (5 MG/ML) 0.5% IN NEBU: 2.5000 mg | INHALATION_SOLUTION | Freq: Two times a day (BID) | RESPIRATORY_TRACT | Status: DC

## 2012-05-02 MED ORDER — WARFARIN SODIUM 5 MG PO TABS
5.0000 mg | ORAL_TABLET | Freq: Once | ORAL | Status: AC
  Administered 2012-05-02: 5 mg via ORAL
  Filled 2012-05-02: qty 1

## 2012-05-02 MED ORDER — ALBUTEROL SULFATE (5 MG/ML) 0.5% IN NEBU: 2.5000 mg | INHALATION_SOLUTION | Freq: Four times a day (QID) | RESPIRATORY_TRACT | Status: DC | PRN

## 2012-05-02 MED ORDER — ALBUTEROL SULFATE (5 MG/ML) 0.5% IN NEBU
2.5000 mg | INHALATION_SOLUTION | Freq: Four times a day (QID) | RESPIRATORY_TRACT | Status: DC
  Administered 2012-05-02 (×3): 2.5 mg via RESPIRATORY_TRACT
  Filled 2012-05-02 (×2): qty 0.5

## 2012-05-02 MED ORDER — IPRATROPIUM BROMIDE 0.02 % IN SOLN
0.5000 mg | Freq: Four times a day (QID) | RESPIRATORY_TRACT | Status: DC
  Administered 2012-05-02 (×3): 0.5 mg via RESPIRATORY_TRACT
  Filled 2012-05-02 (×4): qty 2.5

## 2012-05-02 MED ORDER — FUROSEMIDE 10 MG/ML IJ SOLN
20.0000 mg | Freq: Once | INTRAMUSCULAR | Status: AC
  Administered 2012-05-02: 20 mg via INTRAVENOUS
  Filled 2012-05-02: qty 2

## 2012-05-02 NOTE — Progress Notes (Signed)
CSW met with patient. Patient is alert and oriented X3. Patient is adamantly refusing snf placement. CSW discussed risks. Patient continues to refuse. CSW discussed same with patient's daughter, heidi. CSW explained patient's refusal for snf. Heidi states that she is patients power of attorney. CSW explained that only comes into effect when patient lacks capacity. Heidi became upset and hung up the phone. CSW signs off and defers to care manager.  Taseen Marasigan C. Lorriann Hansmann MSW, LCSW [phone]

## 2012-05-02 NOTE — Progress Notes (Signed)
ANTICOAGULATION CONSULT NOTE - Follow Up  Pharmacy Consult for Warfarin Indication: atrial fibrillation  Allergies  Allergen Reactions  . Sulfonamide Derivatives     Patient Measurements: Height: 6' (182.9 cm) Weight: 214 lb 8.1 oz (97.3 kg) IBW/kg (Calculated) : 77.6  Vital Signs: Temp: 98.8 F (37.1 C) (03/12 0428) Temp src: Oral (03/12 0428) BP: 129/82 mmHg (03/12 0428) Pulse Rate: 72 (03/12 0428)  Labs:  Recent Labs  04/30/12 0118 05/01/12 0339 05/02/12 0500  HGB 14.1 12.8* 12.6*  HCT 43.2 38.3* 38.2*  PLT 167 134* 136*  LABPROT 24.1* 21.1* 19.3*  INR 2.28* 1.90* 1.69*  CREATININE 1.65* 1.23 1.17    Estimated Creatinine Clearance: 61.9 ml/min (by C-G formula based on Cr of 1.17).  Assessment: 79 yo M admitted for fall and confusion, on chronic coumadin for atrial fibrillation and hx stroke- major residual deficit is gait instability. Head CT negative for abnormalities.  Xray neg for fracture.    Home dose coumadin: 2.5mg  MWFSS, and 5mg  on TueThurs. Last dose PTA was 3/8 @ 2100  INR continues to decrease, subtherapeutic. Missed 3/9 dose of warfarin. Will use slightly higher-than-usual dose of warfarin tonight.  Cardiac diet started  No DDIs noted  Goal of Therapy:  INR 2-3 Monitor platelets by anticoagulation protocol: Yes   Plan:  1.  Coumadin 5mg  po x 1  2.  Daily PT/INR  Darrol Angel, PharmD Pager: [phone] 05/02/2012 8:51 AM

## 2012-05-02 NOTE — Progress Notes (Signed)
Subjective: PT recommends SNF- patient is not happy about that recommendation but agrees to consider.  Back pain a little better. O2 sats dropped into 70s while sitting up (?accuracy of pulse ox though he is more dyspneic at rest).  Objective: Vital signs in last 24 hours: Temp:  [97.5 F (36.4 C)-98.8 F (37.1 C)] 98.8 F (37.1 C) (03/12 0428) Pulse Rate:  [57-72] 72 (03/12 0428) Resp:  [19-23] 19 (03/12 0200) BP: (101-181)/(52-82) 129/82 mmHg (03/12 0428) SpO2:  [95 %-100 %] 95 % (03/12 0428) Weight:  [97.3 kg (214 lb 8.1 oz)] 97.3 kg (214 lb 8.1 oz) (03/11 1448) Weight change:     CBG (last 3)   Recent Labs  05/01/12 1705  GLUCAP 128*    Intake/Output from previous day: 03/11 0701 - 03/12 0700 In: 895 [P.O.:120; I.V.:675; IV Piggyback:100] Out: 2125 [Urine:2125] Intake/Output this shift:    General appearance: alert and mild tachypnea Eyes: no scleral icterus Throat: oropharynx moist without erythema Resp: bilateral crackles in bases Cardio: irregularly irregular rhythm (occasional pauses on telemetry) GI: soft, non-tender; bowel sounds normal; no masses,  no organomegaly Extremities: no clubbing, cyanosis or edema   Lab Results:  Recent Labs  05/01/12 0339 05/02/12 0500  NA 137 135  K 3.8 4.4  CL 104 102  CO2 22 25  GLUCOSE 145* 109*  BUN 23 18  CREATININE 1.23 1.17  CALCIUM 8.9 8.9    Recent Labs  04/30/12 0118  AST 34  ALT 15  ALKPHOS 53  BILITOT 0.9  PROT 6.7  ALBUMIN 3.3*    Recent Labs  04/30/12 0118 05/01/12 0339 05/02/12 0500  WBC 18.4* 12.7* 13.7*  NEUTROABS 15.6*  --   --   HGB 14.1 12.8* 12.6*  HCT 43.2 38.3* 38.2*  MCV 92.9 91.6 91.8  PLT 167 134* 136*   Lab Results  Component Value Date   INR 1.69* 05/02/2012   INR 1.90* 05/01/2012   INR 2.28* 04/30/2012   PROTIME 22.8* 10/17/2011   PROTIME 39.6* 10/10/2011   PROTIME 16.5 08/06/2008   Urine Culture- >100K Pseudomonas sensistive to Cefepime and  Cipro  Studies/Results: Dg Lumbar Spine 2-3 Views  04/30/2012  *RADIOLOGY REPORT*  Clinical Data: Low back pain.  Multiple falls.  LUMBAR SPINE - 2-3 VIEW  Comparison: CT scan of the abdomen and pelvis dated 04/28/2009  Findings: The patient has fairly severe degenerative disc disease at L1-2, L3-4, L4-5, and L5-S1 with degenerative changes of the endplates.  There is no fracture or bone destruction.  Minimal degenerative changes of the facet joints.  Extensive calcification in the abdominal aorta and iliac arteries.  IMPRESSION: No acute abnormality.  Multilevel fairly severe degenerative disc disease.   Original Report Authenticated By: Francene Boyers, M.D.      Medications: Scheduled: . antiseptic oral rinse  15 mL Mouth Rinse BID  . atorvastatin  10 mg Oral Daily  . calcium carbonate  1 tablet Oral Daily  . ceFEPime (MAXIPIME) IV  2 g Intravenous Q12H  . cholecalciferol  1,000 Units Oral Daily  . citalopram  20 mg Oral Daily  . lactobacillus acidophilus & bulgar  1 tablet Oral Daily  . metoprolol succinate  50 mg Oral Daily  . pantoprazole  40 mg Oral Daily  . ascorbic acid  1,000 mg Oral Daily  . vitamin E  100 Units Oral Daily  . Warfarin - Pharmacist Dosing Inpatient   Does not apply q1800   Continuous: . sodium chloride 75 mL/hr at 05/02/12  0451    Assessment/Plan:  Principal Problem:  1. SIRS (systemic inflammatory response syndrome) secondary to Pseudomonas UTI- transient hypotension, acute renal failure, leukocytosis all likely secondary to UTI. Leukocytosis improved, afebrile. Continue Cefepime for Pseudomonas UTI- will transition to Cipro at discharge.  Active Problems:  2. Acute Renal Failure-improved with IV fluid hydration. SLIV 3. HYPERTENSION, BENIGN- we'll continue Toprol and monitor blood pressure. 4. Atrial fibrillation- bradycardia improved but occasional pauses- Digoxin discontinued. Continue Bblocker for rate control. Continue anticoagulation for now though  fall risk may eventually make this too risky.  5. Recurrent Falls- continue PT/OT evaluation. SNF rehab recommended. 6. Low back pain- Lumbar x-ray negative for fracture.  Pain control adequate 7. Dyspnea- transient hypoxia when sitting up- will give Lasix X 1 for mild volume overload.  Albuterol/Atrovent nebulizers.  Obtain CXR.  SLIV. 8. Disposition- recommended SNF- will plan for discharge tomorrow if pulmonary status is stable and patient/daughter agreeable.  Discontinue foley and I/O cath.   LOS: 3 days   SHAW,W DOUGLAS 05/02/2012, 7:11 AM

## 2012-05-02 NOTE — Progress Notes (Signed)
Patient is a former smoker who quit 30 years ago.  Patient had Lung CA and had a resection done in 2011.  Patient states that he has not had any episodes of shortness of breath.  Patient does not wear oxygen at home but is on 2L [place] here and sats are 95%.  Patient does not currently use any respiratory medications at home at this time.  RT is decreasing treatment schedule to Albuterol and Atrovent twice daily.

## 2012-05-02 NOTE — Progress Notes (Signed)
NOTIFIED DR. SHAW ABOUT PT. HR GOING INTO THE 30'S AT TIMES, NON-SUSTAIN, DR. SHAW STATED TO CONTINUE WITH THE METOPROLOL  AND GIVE IT.

## 2012-05-02 NOTE — Care Management Note (Addendum)
    Page 1 of 1   05/03/2012     11:19:09 AM   CARE MANAGEMENT NOTE 05/03/2012  Patient:  [name], [name]   Account Number:  [id number]  Date Initiated:  05/02/2012  Documentation initiated by:  Lanier Clam  Subjective/Objective Assessment:   ADMITTED W/FALL.SIRS,UTI.ZO:XWRUEAV CA,AFIB.     Action/Plan:   FROM [facility].   Anticipated DC Date:  05/03/2012   Anticipated DC Plan:  SKILLED NURSING FACILITY      DC Planning Services  CM consult      Choice offered to / List presented to:  C-1 Patient           Status of service:  Completed, signed off Medicare Important Message given?   (If response is "NO", the following Medicare IM given date fields will be blank) Date Medicare IM given:   Date Additional Medicare IM given:    Discharge Disposition:  SKILLED NURSING FACILITY  Per UR Regulation:  Reviewed for med. necessity/level of care/duration of stay  If discussed at Long Length of Stay Meetings, dates discussed:    Comments:  05/03/12 KATHY MAHABIR RN,BSN NCM [phone] NOT APPROPRIATE FOR LTAC/INPT REHAB.D/C SNF.   05/02/12 KATHY MAHABIR RN,BSN NCM [phone] NOTED-PATIENT DECLINES SNF.PREFER INPATIENT REHAB @ [facility].WILL AWAIT IP REHAB MD RECOMMENDATIONS.PATIENT MEETS SCREENING CRITERIA FOR LTAC,MD DECLINES LTAC CONS CURRENTLY.PATIENT IS AGREEABLE TO SERVICES IF @ [facility].HAS A CANE,RW,SAYS HE WAS [facility].PROVIDED W/HHC AGENCY LIST,& PRIVATE DUTY LIST AS A RESOURCE IF PLANS ARE FOR RETURNING TO [facility].

## 2012-05-03 DIAGNOSIS — M545 Low back pain: Secondary | ICD-10-CM | POA: Diagnosis not present

## 2012-05-03 DIAGNOSIS — R6510 Systemic inflammatory response syndrome (SIRS) of non-infectious origin without acute organ dysfunction: Secondary | ICD-10-CM | POA: Diagnosis not present

## 2012-05-03 DIAGNOSIS — N390 Urinary tract infection, site not specified: Secondary | ICD-10-CM | POA: Diagnosis not present

## 2012-05-03 DIAGNOSIS — A419 Sepsis, unspecified organism: Secondary | ICD-10-CM | POA: Diagnosis not present

## 2012-05-03 DIAGNOSIS — J449 Chronic obstructive pulmonary disease, unspecified: Secondary | ICD-10-CM | POA: Diagnosis not present

## 2012-05-03 LAB — PROTIME-INR
INR: 1.85 — ABNORMAL HIGH (ref 0.00–1.49)
Prothrombin Time: 20.7 seconds — ABNORMAL HIGH (ref 11.6–15.2)

## 2012-05-03 LAB — CBC
Hemoglobin: 11.9 g/dL — ABNORMAL LOW (ref 13.0–17.0)
Platelets: 160 10*3/uL (ref 150–400)
RBC: 4.08 MIL/uL — ABNORMAL LOW (ref 4.22–5.81)
WBC: 11.9 10*3/uL — ABNORMAL HIGH (ref 4.0–10.5)

## 2012-05-03 LAB — BASIC METABOLIC PANEL
CO2: 26 mEq/L (ref 19–32)
Calcium: 9.1 mg/dL (ref 8.4–10.5)
Chloride: 100 mEq/L (ref 96–112)
Potassium: 3.5 mEq/L (ref 3.5–5.1)
Sodium: 137 mEq/L (ref 135–145)

## 2012-05-03 MED ORDER — CIPROFLOXACIN HCL 500 MG PO TABS: 500.0000 mg | ORAL_TABLET | Freq: Two times a day (BID) | ORAL | Status: DC

## 2012-05-03 MED ORDER — IPRATROPIUM BROMIDE 0.02 % IN SOLN: 0.5000 mg | Freq: Four times a day (QID) | RESPIRATORY_TRACT | Status: DC | PRN

## 2012-05-03 MED ORDER — ALBUTEROL SULFATE (5 MG/ML) 0.5% IN NEBU: 2.5000 mg | INHALATION_SOLUTION | Freq: Four times a day (QID) | RESPIRATORY_TRACT | Status: DC | PRN

## 2012-05-03 MED ORDER — CEPHALEXIN 500 MG PO CAPS: ORAL_CAPSULE | ORAL | Status: DC

## 2012-05-03 MED ORDER — CIPROFLOXACIN HCL 500 MG PO TABS
500.0000 mg | ORAL_TABLET | Freq: Two times a day (BID) | ORAL | Status: DC
  Administered 2012-05-03: 500 mg via ORAL
  Filled 2012-05-03 (×3): qty 1

## 2012-05-03 NOTE — Progress Notes (Signed)
Report given to Drinda Butts, Charity fundraiser at [facility].

## 2012-05-03 NOTE — Progress Notes (Signed)
Occupational Therapy Treatment Patient Details Name: Dennis CHANDRAN MRN: [id number] DOB: [birth year] Today's Date: 05/03/2012 Time: 5631-4970 OT Time Calculation (min): 13 min  OT Assessment / Plan / Recommendation    Follow Up Recommendations  SNF             Frequency Min 2X/week   Plan Discharge plan needs to be updated    Precautions / Restrictions Precautions Precautions: Fall Restrictions Weight Bearing Restrictions: No       ADL  Grooming: Performed;Wash/dry face;Wash/dry hands Where Assessed - Grooming: Unsupported sitting Toilet Transfer: Performed;Moderate assistance Toilet Transfer Method: Sit to stand;Other (comment) (for urinal) Toileting - Clothing Manipulation and Hygiene: Moderate assistance Where Assessed - Toileting Clothing Manipulation and Hygiene: Standing       OT Goals ADL Goals ADL Goal: Grooming - Progress: Progressing toward goals ADL Goal: Toilet Transfer - Progress: Progressing toward goals  Visit Information  Last OT Received On: 05/03/12    Subjective Data  Subjective: my wife is pleased i am going to rehab      Cognition  Cognition Overall Cognitive Status: Appears within functional limits for tasks assessed/performed Arousal/Alertness: Awake/alert Behavior During Session: [facility] for tasks performed Safety/Judgement - Other Comments: increased awarenss of deficits today    Mobility  Bed Mobility Supine to Sit: 4: Min assist Transfers Transfers: Sit to Stand;Stand to Sit Sit to Stand: 3: Mod assist;With armrests;From bed Stand to Sit: 3: Mod assist;With armrests;To chair/3-in-1          End of Session OT - End of Session Activity Tolerance: Patient tolerated treatment well  GO     [place], Metro Kung 05/03/2012, 9:06 AM
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
## 2012-05-06 LAB — CULTURE, BLOOD (ROUTINE X 2)
Culture: NO GROWTH
Culture: NO GROWTH

## 2012-05-08 DIAGNOSIS — J449 Chronic obstructive pulmonary disease, unspecified: Secondary | ICD-10-CM | POA: Diagnosis not present

## 2012-06-04 DIAGNOSIS — I4891 Unspecified atrial fibrillation: Secondary | ICD-10-CM | POA: Diagnosis not present

## 2012-06-04 DIAGNOSIS — R6510 Systemic inflammatory response syndrome (SIRS) of non-infectious origin without acute organ dysfunction: Secondary | ICD-10-CM | POA: Diagnosis not present

## 2012-06-04 DIAGNOSIS — G609 Hereditary and idiopathic neuropathy, unspecified: Secondary | ICD-10-CM | POA: Diagnosis not present

## 2012-06-04 DIAGNOSIS — R269 Unspecified abnormalities of gait and mobility: Secondary | ICD-10-CM | POA: Diagnosis not present

## 2012-06-04 DIAGNOSIS — Z7901 Long term (current) use of anticoagulants: Secondary | ICD-10-CM | POA: Diagnosis not present

## 2012-06-04 DIAGNOSIS — M25519 Pain in unspecified shoulder: Secondary | ICD-10-CM | POA: Diagnosis not present

## 2012-06-07 DIAGNOSIS — R279 Unspecified lack of coordination: Secondary | ICD-10-CM | POA: Diagnosis not present

## 2012-06-07 DIAGNOSIS — R269 Unspecified abnormalities of gait and mobility: Secondary | ICD-10-CM | POA: Diagnosis not present

## 2012-06-07 DIAGNOSIS — Z9181 History of falling: Secondary | ICD-10-CM | POA: Diagnosis not present

## 2012-06-11 DIAGNOSIS — R269 Unspecified abnormalities of gait and mobility: Secondary | ICD-10-CM | POA: Diagnosis not present

## 2012-06-11 DIAGNOSIS — Z9181 History of falling: Secondary | ICD-10-CM | POA: Diagnosis not present

## 2012-06-11 DIAGNOSIS — R279 Unspecified lack of coordination: Secondary | ICD-10-CM | POA: Diagnosis not present

## 2012-06-13 DIAGNOSIS — R269 Unspecified abnormalities of gait and mobility: Secondary | ICD-10-CM | POA: Diagnosis not present

## 2012-06-13 DIAGNOSIS — R279 Unspecified lack of coordination: Secondary | ICD-10-CM | POA: Diagnosis not present

## 2012-06-13 DIAGNOSIS — Z9181 History of falling: Secondary | ICD-10-CM | POA: Diagnosis not present

## 2012-06-15 DIAGNOSIS — R269 Unspecified abnormalities of gait and mobility: Secondary | ICD-10-CM | POA: Diagnosis not present

## 2012-06-15 DIAGNOSIS — Z9181 History of falling: Secondary | ICD-10-CM | POA: Diagnosis not present

## 2012-06-15 DIAGNOSIS — R279 Unspecified lack of coordination: Secondary | ICD-10-CM | POA: Diagnosis not present

## 2012-06-18 DIAGNOSIS — I4891 Unspecified atrial fibrillation: Secondary | ICD-10-CM | POA: Diagnosis not present

## 2012-06-18 DIAGNOSIS — Z7901 Long term (current) use of anticoagulants: Secondary | ICD-10-CM | POA: Diagnosis not present

## 2012-06-19 DIAGNOSIS — R269 Unspecified abnormalities of gait and mobility: Secondary | ICD-10-CM | POA: Diagnosis not present

## 2012-06-19 DIAGNOSIS — Z9181 History of falling: Secondary | ICD-10-CM | POA: Diagnosis not present

## 2012-06-19 DIAGNOSIS — R279 Unspecified lack of coordination: Secondary | ICD-10-CM | POA: Diagnosis not present

## 2012-06-20 DIAGNOSIS — R269 Unspecified abnormalities of gait and mobility: Secondary | ICD-10-CM | POA: Diagnosis not present

## 2012-06-20 DIAGNOSIS — R279 Unspecified lack of coordination: Secondary | ICD-10-CM | POA: Diagnosis not present

## 2012-06-20 DIAGNOSIS — Z9181 History of falling: Secondary | ICD-10-CM | POA: Diagnosis not present

## 2012-06-21 DIAGNOSIS — R269 Unspecified abnormalities of gait and mobility: Secondary | ICD-10-CM | POA: Diagnosis not present

## 2012-06-21 DIAGNOSIS — R279 Unspecified lack of coordination: Secondary | ICD-10-CM | POA: Diagnosis not present

## 2012-06-21 DIAGNOSIS — Z9181 History of falling: Secondary | ICD-10-CM | POA: Diagnosis not present

## 2012-06-22 DIAGNOSIS — R279 Unspecified lack of coordination: Secondary | ICD-10-CM | POA: Diagnosis not present

## 2012-06-22 DIAGNOSIS — R269 Unspecified abnormalities of gait and mobility: Secondary | ICD-10-CM | POA: Diagnosis not present

## 2012-06-22 DIAGNOSIS — Z9181 History of falling: Secondary | ICD-10-CM | POA: Diagnosis not present

## 2012-06-25 DIAGNOSIS — Z9181 History of falling: Secondary | ICD-10-CM | POA: Diagnosis not present

## 2012-06-25 DIAGNOSIS — R269 Unspecified abnormalities of gait and mobility: Secondary | ICD-10-CM | POA: Diagnosis not present

## 2012-06-25 DIAGNOSIS — R279 Unspecified lack of coordination: Secondary | ICD-10-CM | POA: Diagnosis not present

## 2012-06-26 DIAGNOSIS — R279 Unspecified lack of coordination: Secondary | ICD-10-CM | POA: Diagnosis not present

## 2012-06-26 DIAGNOSIS — Z9181 History of falling: Secondary | ICD-10-CM | POA: Diagnosis not present

## 2012-06-26 DIAGNOSIS — R269 Unspecified abnormalities of gait and mobility: Secondary | ICD-10-CM | POA: Diagnosis not present

## 2012-06-27 DIAGNOSIS — R269 Unspecified abnormalities of gait and mobility: Secondary | ICD-10-CM | POA: Diagnosis not present

## 2012-06-27 DIAGNOSIS — Z9181 History of falling: Secondary | ICD-10-CM | POA: Diagnosis not present

## 2012-06-27 DIAGNOSIS — R279 Unspecified lack of coordination: Secondary | ICD-10-CM | POA: Diagnosis not present

## 2012-06-28 DIAGNOSIS — R279 Unspecified lack of coordination: Secondary | ICD-10-CM | POA: Diagnosis not present

## 2012-06-28 DIAGNOSIS — R269 Unspecified abnormalities of gait and mobility: Secondary | ICD-10-CM | POA: Diagnosis not present

## 2012-06-28 DIAGNOSIS — Z9181 History of falling: Secondary | ICD-10-CM | POA: Diagnosis not present

## 2012-06-29 DIAGNOSIS — Z9181 History of falling: Secondary | ICD-10-CM | POA: Diagnosis not present

## 2012-06-29 DIAGNOSIS — R279 Unspecified lack of coordination: Secondary | ICD-10-CM | POA: Diagnosis not present

## 2012-06-29 DIAGNOSIS — R269 Unspecified abnormalities of gait and mobility: Secondary | ICD-10-CM | POA: Diagnosis not present

## 2012-06-30 DIAGNOSIS — G609 Hereditary and idiopathic neuropathy, unspecified: Secondary | ICD-10-CM | POA: Diagnosis not present

## 2012-06-30 DIAGNOSIS — I4891 Unspecified atrial fibrillation: Secondary | ICD-10-CM | POA: Diagnosis not present

## 2012-06-30 DIAGNOSIS — R6510 Systemic inflammatory response syndrome (SIRS) of non-infectious origin without acute organ dysfunction: Secondary | ICD-10-CM | POA: Diagnosis not present

## 2012-06-30 DIAGNOSIS — R269 Unspecified abnormalities of gait and mobility: Secondary | ICD-10-CM | POA: Diagnosis not present

## 2012-06-30 DIAGNOSIS — Z7901 Long term (current) use of anticoagulants: Secondary | ICD-10-CM | POA: Diagnosis not present

## 2012-06-30 DIAGNOSIS — M25519 Pain in unspecified shoulder: Secondary | ICD-10-CM | POA: Diagnosis not present

## 2012-07-02 DIAGNOSIS — R279 Unspecified lack of coordination: Secondary | ICD-10-CM | POA: Diagnosis not present

## 2012-07-02 DIAGNOSIS — R269 Unspecified abnormalities of gait and mobility: Secondary | ICD-10-CM | POA: Diagnosis not present

## 2012-07-02 DIAGNOSIS — Z9181 History of falling: Secondary | ICD-10-CM | POA: Diagnosis not present

## 2012-07-03 DIAGNOSIS — Z9181 History of falling: Secondary | ICD-10-CM | POA: Diagnosis not present

## 2012-07-03 DIAGNOSIS — R269 Unspecified abnormalities of gait and mobility: Secondary | ICD-10-CM | POA: Diagnosis not present

## 2012-07-03 DIAGNOSIS — R279 Unspecified lack of coordination: Secondary | ICD-10-CM | POA: Diagnosis not present

## 2012-07-04 DIAGNOSIS — R7301 Impaired fasting glucose: Secondary | ICD-10-CM | POA: Diagnosis not present

## 2012-07-04 DIAGNOSIS — N390 Urinary tract infection, site not specified: Secondary | ICD-10-CM | POA: Diagnosis not present

## 2012-07-04 DIAGNOSIS — C679 Malignant neoplasm of bladder, unspecified: Secondary | ICD-10-CM | POA: Diagnosis not present

## 2012-07-04 DIAGNOSIS — Z6828 Body mass index (BMI) 28.0-28.9, adult: Secondary | ICD-10-CM | POA: Diagnosis not present

## 2012-07-04 DIAGNOSIS — I4891 Unspecified atrial fibrillation: Secondary | ICD-10-CM | POA: Diagnosis not present

## 2012-07-04 DIAGNOSIS — R269 Unspecified abnormalities of gait and mobility: Secondary | ICD-10-CM | POA: Diagnosis not present

## 2012-07-04 DIAGNOSIS — C3490 Malignant neoplasm of unspecified part of unspecified bronchus or lung: Secondary | ICD-10-CM | POA: Diagnosis not present

## 2012-07-04 DIAGNOSIS — R82998 Other abnormal findings in urine: Secondary | ICD-10-CM | POA: Diagnosis not present

## 2012-07-05 DIAGNOSIS — R269 Unspecified abnormalities of gait and mobility: Secondary | ICD-10-CM | POA: Diagnosis not present

## 2012-07-05 DIAGNOSIS — R279 Unspecified lack of coordination: Secondary | ICD-10-CM | POA: Diagnosis not present

## 2012-07-05 DIAGNOSIS — Z9181 History of falling: Secondary | ICD-10-CM | POA: Diagnosis not present

## 2012-07-06 DIAGNOSIS — R269 Unspecified abnormalities of gait and mobility: Secondary | ICD-10-CM | POA: Diagnosis not present

## 2012-07-06 DIAGNOSIS — Z9181 History of falling: Secondary | ICD-10-CM | POA: Diagnosis not present

## 2012-07-06 DIAGNOSIS — R279 Unspecified lack of coordination: Secondary | ICD-10-CM | POA: Diagnosis not present

## 2012-07-09 DIAGNOSIS — R269 Unspecified abnormalities of gait and mobility: Secondary | ICD-10-CM | POA: Diagnosis not present

## 2012-07-09 DIAGNOSIS — Z9181 History of falling: Secondary | ICD-10-CM | POA: Diagnosis not present

## 2012-07-09 DIAGNOSIS — R279 Unspecified lack of coordination: Secondary | ICD-10-CM | POA: Diagnosis not present

## 2012-07-10 DIAGNOSIS — R269 Unspecified abnormalities of gait and mobility: Secondary | ICD-10-CM | POA: Diagnosis not present

## 2012-07-10 DIAGNOSIS — Z9181 History of falling: Secondary | ICD-10-CM | POA: Diagnosis not present

## 2012-07-10 DIAGNOSIS — R279 Unspecified lack of coordination: Secondary | ICD-10-CM | POA: Diagnosis not present

## 2012-07-11 DIAGNOSIS — Z8551 Personal history of malignant neoplasm of bladder: Secondary | ICD-10-CM | POA: Diagnosis not present

## 2012-07-11 DIAGNOSIS — N1330 Unspecified hydronephrosis: Secondary | ICD-10-CM | POA: Diagnosis not present

## 2012-07-11 DIAGNOSIS — N390 Urinary tract infection, site not specified: Secondary | ICD-10-CM | POA: Diagnosis not present

## 2012-07-12 DIAGNOSIS — R279 Unspecified lack of coordination: Secondary | ICD-10-CM | POA: Diagnosis not present

## 2012-07-12 DIAGNOSIS — Z9181 History of falling: Secondary | ICD-10-CM | POA: Diagnosis not present

## 2012-07-12 DIAGNOSIS — R269 Unspecified abnormalities of gait and mobility: Secondary | ICD-10-CM | POA: Diagnosis not present

## 2012-07-13 DIAGNOSIS — Z9181 History of falling: Secondary | ICD-10-CM | POA: Diagnosis not present

## 2012-07-13 DIAGNOSIS — R269 Unspecified abnormalities of gait and mobility: Secondary | ICD-10-CM | POA: Diagnosis not present

## 2012-07-13 DIAGNOSIS — R279 Unspecified lack of coordination: Secondary | ICD-10-CM | POA: Diagnosis not present

## 2012-07-18 DIAGNOSIS — Z9181 History of falling: Secondary | ICD-10-CM | POA: Diagnosis not present

## 2012-07-18 DIAGNOSIS — R269 Unspecified abnormalities of gait and mobility: Secondary | ICD-10-CM | POA: Diagnosis not present

## 2012-07-18 DIAGNOSIS — R279 Unspecified lack of coordination: Secondary | ICD-10-CM | POA: Diagnosis not present

## 2012-07-19 DIAGNOSIS — Z9181 History of falling: Secondary | ICD-10-CM | POA: Diagnosis not present

## 2012-07-19 DIAGNOSIS — R269 Unspecified abnormalities of gait and mobility: Secondary | ICD-10-CM | POA: Diagnosis not present

## 2012-07-19 DIAGNOSIS — R279 Unspecified lack of coordination: Secondary | ICD-10-CM | POA: Diagnosis not present

## 2012-07-23 DIAGNOSIS — Z9181 History of falling: Secondary | ICD-10-CM | POA: Diagnosis not present

## 2012-07-23 DIAGNOSIS — R269 Unspecified abnormalities of gait and mobility: Secondary | ICD-10-CM | POA: Diagnosis not present

## 2012-07-23 DIAGNOSIS — R279 Unspecified lack of coordination: Secondary | ICD-10-CM | POA: Diagnosis not present

## 2012-07-24 DIAGNOSIS — R279 Unspecified lack of coordination: Secondary | ICD-10-CM | POA: Diagnosis not present

## 2012-07-24 DIAGNOSIS — Z9181 History of falling: Secondary | ICD-10-CM | POA: Diagnosis not present

## 2012-07-24 DIAGNOSIS — R269 Unspecified abnormalities of gait and mobility: Secondary | ICD-10-CM | POA: Diagnosis not present

## 2012-07-25 DIAGNOSIS — Z9181 History of falling: Secondary | ICD-10-CM | POA: Diagnosis not present

## 2012-07-25 DIAGNOSIS — R269 Unspecified abnormalities of gait and mobility: Secondary | ICD-10-CM | POA: Diagnosis not present

## 2012-07-25 DIAGNOSIS — R279 Unspecified lack of coordination: Secondary | ICD-10-CM | POA: Diagnosis not present

## 2012-07-26 DIAGNOSIS — Z9181 History of falling: Secondary | ICD-10-CM | POA: Diagnosis not present

## 2012-07-26 DIAGNOSIS — R269 Unspecified abnormalities of gait and mobility: Secondary | ICD-10-CM | POA: Diagnosis not present

## 2012-07-26 DIAGNOSIS — R279 Unspecified lack of coordination: Secondary | ICD-10-CM | POA: Diagnosis not present

## 2012-07-30 DIAGNOSIS — Z9181 History of falling: Secondary | ICD-10-CM | POA: Diagnosis not present

## 2012-07-30 DIAGNOSIS — R269 Unspecified abnormalities of gait and mobility: Secondary | ICD-10-CM | POA: Diagnosis not present

## 2012-07-30 DIAGNOSIS — R279 Unspecified lack of coordination: Secondary | ICD-10-CM | POA: Diagnosis not present

## 2012-07-31 DIAGNOSIS — R279 Unspecified lack of coordination: Secondary | ICD-10-CM | POA: Diagnosis not present

## 2012-07-31 DIAGNOSIS — Z9181 History of falling: Secondary | ICD-10-CM | POA: Diagnosis not present

## 2012-07-31 DIAGNOSIS — R269 Unspecified abnormalities of gait and mobility: Secondary | ICD-10-CM | POA: Diagnosis not present

## 2012-08-01 DIAGNOSIS — R269 Unspecified abnormalities of gait and mobility: Secondary | ICD-10-CM | POA: Diagnosis not present

## 2012-08-01 DIAGNOSIS — Z9181 History of falling: Secondary | ICD-10-CM | POA: Diagnosis not present

## 2012-08-01 DIAGNOSIS — R279 Unspecified lack of coordination: Secondary | ICD-10-CM | POA: Diagnosis not present

## 2012-08-02 DIAGNOSIS — Z9181 History of falling: Secondary | ICD-10-CM | POA: Diagnosis not present

## 2012-08-02 DIAGNOSIS — R269 Unspecified abnormalities of gait and mobility: Secondary | ICD-10-CM | POA: Diagnosis not present

## 2012-08-02 DIAGNOSIS — R279 Unspecified lack of coordination: Secondary | ICD-10-CM | POA: Diagnosis not present

## 2012-08-07 DIAGNOSIS — R279 Unspecified lack of coordination: Secondary | ICD-10-CM | POA: Diagnosis not present

## 2012-08-07 DIAGNOSIS — R269 Unspecified abnormalities of gait and mobility: Secondary | ICD-10-CM | POA: Diagnosis not present

## 2012-08-07 DIAGNOSIS — Z9181 History of falling: Secondary | ICD-10-CM | POA: Diagnosis not present

## 2012-08-10 DIAGNOSIS — Z9181 History of falling: Secondary | ICD-10-CM | POA: Diagnosis not present

## 2012-08-10 DIAGNOSIS — R279 Unspecified lack of coordination: Secondary | ICD-10-CM | POA: Diagnosis not present

## 2012-08-10 DIAGNOSIS — R269 Unspecified abnormalities of gait and mobility: Secondary | ICD-10-CM | POA: Diagnosis not present

## 2012-08-13 DIAGNOSIS — Z9181 History of falling: Secondary | ICD-10-CM | POA: Diagnosis not present

## 2012-08-13 DIAGNOSIS — R279 Unspecified lack of coordination: Secondary | ICD-10-CM | POA: Diagnosis not present

## 2012-08-13 DIAGNOSIS — R269 Unspecified abnormalities of gait and mobility: Secondary | ICD-10-CM | POA: Diagnosis not present

## 2012-08-14 DIAGNOSIS — R269 Unspecified abnormalities of gait and mobility: Secondary | ICD-10-CM | POA: Diagnosis not present

## 2012-08-14 DIAGNOSIS — Z9181 History of falling: Secondary | ICD-10-CM | POA: Diagnosis not present

## 2012-08-14 DIAGNOSIS — R279 Unspecified lack of coordination: Secondary | ICD-10-CM | POA: Diagnosis not present

## 2012-08-15 DIAGNOSIS — R279 Unspecified lack of coordination: Secondary | ICD-10-CM | POA: Diagnosis not present

## 2012-08-15 DIAGNOSIS — R269 Unspecified abnormalities of gait and mobility: Secondary | ICD-10-CM | POA: Diagnosis not present

## 2012-08-15 DIAGNOSIS — Z9181 History of falling: Secondary | ICD-10-CM | POA: Diagnosis not present

## 2012-08-16 DIAGNOSIS — R269 Unspecified abnormalities of gait and mobility: Secondary | ICD-10-CM | POA: Diagnosis not present

## 2012-08-16 DIAGNOSIS — R279 Unspecified lack of coordination: Secondary | ICD-10-CM | POA: Diagnosis not present

## 2012-08-16 DIAGNOSIS — Z9181 History of falling: Secondary | ICD-10-CM | POA: Diagnosis not present

## 2012-08-17 DIAGNOSIS — Z9181 History of falling: Secondary | ICD-10-CM | POA: Diagnosis not present

## 2012-08-17 DIAGNOSIS — R269 Unspecified abnormalities of gait and mobility: Secondary | ICD-10-CM | POA: Diagnosis not present

## 2012-08-17 DIAGNOSIS — R279 Unspecified lack of coordination: Secondary | ICD-10-CM | POA: Diagnosis not present

## 2012-08-20 DIAGNOSIS — Z7901 Long term (current) use of anticoagulants: Secondary | ICD-10-CM | POA: Diagnosis not present

## 2012-08-20 DIAGNOSIS — I4891 Unspecified atrial fibrillation: Secondary | ICD-10-CM | POA: Diagnosis not present

## 2012-08-21 DIAGNOSIS — Z9181 History of falling: Secondary | ICD-10-CM | POA: Diagnosis not present

## 2012-08-21 DIAGNOSIS — R269 Unspecified abnormalities of gait and mobility: Secondary | ICD-10-CM | POA: Diagnosis not present

## 2012-08-21 DIAGNOSIS — R279 Unspecified lack of coordination: Secondary | ICD-10-CM | POA: Diagnosis not present

## 2012-08-22 DIAGNOSIS — Z9181 History of falling: Secondary | ICD-10-CM | POA: Diagnosis not present

## 2012-08-22 DIAGNOSIS — R269 Unspecified abnormalities of gait and mobility: Secondary | ICD-10-CM | POA: Diagnosis not present

## 2012-08-22 DIAGNOSIS — R279 Unspecified lack of coordination: Secondary | ICD-10-CM | POA: Diagnosis not present

## 2012-08-23 DIAGNOSIS — Z9181 History of falling: Secondary | ICD-10-CM | POA: Diagnosis not present

## 2012-08-23 DIAGNOSIS — R269 Unspecified abnormalities of gait and mobility: Secondary | ICD-10-CM | POA: Diagnosis not present

## 2012-08-23 DIAGNOSIS — R279 Unspecified lack of coordination: Secondary | ICD-10-CM | POA: Diagnosis not present

## 2012-08-28 DIAGNOSIS — R279 Unspecified lack of coordination: Secondary | ICD-10-CM | POA: Diagnosis not present

## 2012-08-28 DIAGNOSIS — R269 Unspecified abnormalities of gait and mobility: Secondary | ICD-10-CM | POA: Diagnosis not present

## 2012-08-28 DIAGNOSIS — Z9181 History of falling: Secondary | ICD-10-CM | POA: Diagnosis not present

## 2012-08-29 DIAGNOSIS — R269 Unspecified abnormalities of gait and mobility: Secondary | ICD-10-CM | POA: Diagnosis not present

## 2012-08-29 DIAGNOSIS — Z9181 History of falling: Secondary | ICD-10-CM | POA: Diagnosis not present

## 2012-08-29 DIAGNOSIS — R279 Unspecified lack of coordination: Secondary | ICD-10-CM | POA: Diagnosis not present

## 2012-08-30 DIAGNOSIS — Z9181 History of falling: Secondary | ICD-10-CM | POA: Diagnosis not present

## 2012-08-30 DIAGNOSIS — R279 Unspecified lack of coordination: Secondary | ICD-10-CM | POA: Diagnosis not present

## 2012-08-30 DIAGNOSIS — R269 Unspecified abnormalities of gait and mobility: Secondary | ICD-10-CM | POA: Diagnosis not present

## 2012-09-03 DIAGNOSIS — R269 Unspecified abnormalities of gait and mobility: Secondary | ICD-10-CM | POA: Diagnosis not present

## 2012-09-03 DIAGNOSIS — Z9181 History of falling: Secondary | ICD-10-CM | POA: Diagnosis not present

## 2012-09-03 DIAGNOSIS — R279 Unspecified lack of coordination: Secondary | ICD-10-CM | POA: Diagnosis not present

## 2012-09-05 ENCOUNTER — Other Ambulatory Visit: Payer: Self-pay | Admitting: Internal Medicine

## 2012-09-05 ENCOUNTER — Other Ambulatory Visit (HOSPITAL_COMMUNITY): Payer: Self-pay | Admitting: Internal Medicine

## 2012-09-05 ENCOUNTER — Ambulatory Visit (HOSPITAL_COMMUNITY)
Admission: RE | Admit: 2012-09-05 | Discharge: 2012-09-05 | Disposition: A | Discharge status: Home / Self Care | Payer: Medicare Other | Source: Ambulatory Visit | Attending: Internal Medicine | Admitting: Internal Medicine

## 2012-09-05 DIAGNOSIS — M19019 Primary osteoarthritis, unspecified shoulder: Secondary | ICD-10-CM | POA: Insufficient documentation

## 2012-09-05 DIAGNOSIS — Z7901 Long term (current) use of anticoagulants: Secondary | ICD-10-CM | POA: Diagnosis not present

## 2012-09-05 DIAGNOSIS — Z6829 Body mass index (BMI) 29.0-29.9, adult: Secondary | ICD-10-CM | POA: Diagnosis not present

## 2012-09-05 DIAGNOSIS — R52 Pain, unspecified: Secondary | ICD-10-CM

## 2012-09-05 DIAGNOSIS — M25519 Pain in unspecified shoulder: Secondary | ICD-10-CM | POA: Insufficient documentation

## 2012-09-05 DIAGNOSIS — Z9181 History of falling: Secondary | ICD-10-CM | POA: Diagnosis not present

## 2012-09-05 DIAGNOSIS — R269 Unspecified abnormalities of gait and mobility: Secondary | ICD-10-CM | POA: Diagnosis not present

## 2012-09-05 DIAGNOSIS — R279 Unspecified lack of coordination: Secondary | ICD-10-CM | POA: Diagnosis not present

## 2012-09-05 DIAGNOSIS — S4980XA Other specified injuries of shoulder and upper arm, unspecified arm, initial encounter: Secondary | ICD-10-CM | POA: Diagnosis not present

## 2012-09-05 DIAGNOSIS — W19XXXA Unspecified fall, initial encounter: Secondary | ICD-10-CM | POA: Insufficient documentation

## 2012-09-27 DIAGNOSIS — Z7901 Long term (current) use of anticoagulants: Secondary | ICD-10-CM | POA: Diagnosis not present

## 2012-09-27 DIAGNOSIS — I4891 Unspecified atrial fibrillation: Secondary | ICD-10-CM | POA: Diagnosis not present

## 2012-10-02 DIAGNOSIS — S46819A Strain of other muscles, fascia and tendons at shoulder and upper arm level, unspecified arm, initial encounter: Secondary | ICD-10-CM | POA: Diagnosis not present

## 2012-10-08 DIAGNOSIS — Z6828 Body mass index (BMI) 28.0-28.9, adult: Secondary | ICD-10-CM | POA: Diagnosis not present

## 2012-10-08 DIAGNOSIS — I4891 Unspecified atrial fibrillation: Secondary | ICD-10-CM | POA: Diagnosis not present

## 2012-10-08 DIAGNOSIS — Z7901 Long term (current) use of anticoagulants: Secondary | ICD-10-CM | POA: Diagnosis not present

## 2012-10-08 DIAGNOSIS — K219 Gastro-esophageal reflux disease without esophagitis: Secondary | ICD-10-CM | POA: Diagnosis not present

## 2012-10-08 DIAGNOSIS — R7301 Impaired fasting glucose: Secondary | ICD-10-CM | POA: Diagnosis not present

## 2012-10-08 DIAGNOSIS — M25519 Pain in unspecified shoulder: Secondary | ICD-10-CM | POA: Diagnosis not present

## 2012-10-08 DIAGNOSIS — R269 Unspecified abnormalities of gait and mobility: Secondary | ICD-10-CM | POA: Diagnosis not present

## 2012-10-23 DIAGNOSIS — I4891 Unspecified atrial fibrillation: Secondary | ICD-10-CM | POA: Diagnosis not present

## 2012-10-23 DIAGNOSIS — Z7901 Long term (current) use of anticoagulants: Secondary | ICD-10-CM | POA: Diagnosis not present

## 2012-11-06 DIAGNOSIS — Z7901 Long term (current) use of anticoagulants: Secondary | ICD-10-CM | POA: Diagnosis not present

## 2012-11-06 DIAGNOSIS — I4891 Unspecified atrial fibrillation: Secondary | ICD-10-CM | POA: Diagnosis not present

## 2012-11-06 DIAGNOSIS — Z23 Encounter for immunization: Secondary | ICD-10-CM | POA: Diagnosis not present

## 2012-12-11 DIAGNOSIS — R809 Proteinuria, unspecified: Secondary | ICD-10-CM | POA: Diagnosis not present

## 2012-12-11 DIAGNOSIS — N390 Urinary tract infection, site not specified: Secondary | ICD-10-CM | POA: Diagnosis not present

## 2012-12-11 DIAGNOSIS — I4891 Unspecified atrial fibrillation: Secondary | ICD-10-CM | POA: Diagnosis not present

## 2012-12-11 DIAGNOSIS — Z7901 Long term (current) use of anticoagulants: Secondary | ICD-10-CM | POA: Diagnosis not present

## 2012-12-25 DIAGNOSIS — S46819A Strain of other muscles, fascia and tendons at shoulder and upper arm level, unspecified arm, initial encounter: Secondary | ICD-10-CM | POA: Diagnosis not present

## 2013-01-09 DIAGNOSIS — H3530 Unspecified macular degeneration: Secondary | ICD-10-CM | POA: Diagnosis not present

## 2013-01-09 DIAGNOSIS — Z961 Presence of intraocular lens: Secondary | ICD-10-CM | POA: Diagnosis not present

## 2013-01-15 DIAGNOSIS — Z7901 Long term (current) use of anticoagulants: Secondary | ICD-10-CM | POA: Diagnosis not present

## 2013-01-15 DIAGNOSIS — I4891 Unspecified atrial fibrillation: Secondary | ICD-10-CM | POA: Diagnosis not present

## 2013-01-24 DIAGNOSIS — M899 Disorder of bone, unspecified: Secondary | ICD-10-CM | POA: Diagnosis not present

## 2013-01-24 DIAGNOSIS — N390 Urinary tract infection, site not specified: Secondary | ICD-10-CM | POA: Diagnosis not present

## 2013-01-24 DIAGNOSIS — R7301 Impaired fasting glucose: Secondary | ICD-10-CM | POA: Diagnosis not present

## 2013-01-24 DIAGNOSIS — R809 Proteinuria, unspecified: Secondary | ICD-10-CM | POA: Diagnosis not present

## 2013-01-24 DIAGNOSIS — Z7901 Long term (current) use of anticoagulants: Secondary | ICD-10-CM | POA: Diagnosis not present

## 2013-01-24 DIAGNOSIS — E785 Hyperlipidemia, unspecified: Secondary | ICD-10-CM | POA: Diagnosis not present

## 2013-01-24 DIAGNOSIS — I4891 Unspecified atrial fibrillation: Secondary | ICD-10-CM | POA: Diagnosis not present

## 2013-01-24 DIAGNOSIS — Z1331 Encounter for screening for depression: Secondary | ICD-10-CM | POA: Diagnosis not present

## 2013-01-24 DIAGNOSIS — Z23 Encounter for immunization: Secondary | ICD-10-CM | POA: Diagnosis not present

## 2013-01-25 DIAGNOSIS — R279 Unspecified lack of coordination: Secondary | ICD-10-CM | POA: Diagnosis not present

## 2013-01-25 DIAGNOSIS — Z9181 History of falling: Secondary | ICD-10-CM | POA: Diagnosis not present

## 2013-01-25 DIAGNOSIS — R269 Unspecified abnormalities of gait and mobility: Secondary | ICD-10-CM | POA: Diagnosis not present

## 2013-01-25 DIAGNOSIS — M6281 Muscle weakness (generalized): Secondary | ICD-10-CM | POA: Diagnosis not present

## 2013-01-30 DIAGNOSIS — Z7901 Long term (current) use of anticoagulants: Secondary | ICD-10-CM | POA: Diagnosis not present

## 2013-01-30 DIAGNOSIS — I4891 Unspecified atrial fibrillation: Secondary | ICD-10-CM | POA: Diagnosis not present

## 2013-02-04 DIAGNOSIS — R269 Unspecified abnormalities of gait and mobility: Secondary | ICD-10-CM | POA: Diagnosis not present

## 2013-02-04 DIAGNOSIS — R279 Unspecified lack of coordination: Secondary | ICD-10-CM | POA: Diagnosis not present

## 2013-02-04 DIAGNOSIS — M6281 Muscle weakness (generalized): Secondary | ICD-10-CM | POA: Diagnosis not present

## 2013-02-04 DIAGNOSIS — Z9181 History of falling: Secondary | ICD-10-CM | POA: Diagnosis not present

## 2013-02-06 ENCOUNTER — Ambulatory Visit (INDEPENDENT_AMBULATORY_CARE_PROVIDER_SITE_OTHER): Payer: Medicare Other | Admitting: Cardiovascular Disease

## 2013-02-06 ENCOUNTER — Encounter: Payer: Self-pay | Admitting: Cardiovascular Disease

## 2013-02-06 VITALS — BP 132/77 | HR 64 | Ht 78.0 in | Wt 210.0 lb

## 2013-02-06 DIAGNOSIS — I10 Essential (primary) hypertension: Secondary | ICD-10-CM | POA: Diagnosis not present

## 2013-02-06 DIAGNOSIS — I4891 Unspecified atrial fibrillation: Secondary | ICD-10-CM | POA: Diagnosis not present

## 2013-02-06 DIAGNOSIS — E785 Hyperlipidemia, unspecified: Secondary | ICD-10-CM

## 2013-02-06 NOTE — Patient Instructions (Signed)
Your physician wants you to follow-up in: 1 YEAR with Dr Cooper.  You will receive a reminder letter in the mail two months in advance. If you don't receive a letter, please call our office to schedule the follow-up appointment.  Your physician recommends that you continue on your current medications as directed. Please refer to the Current Medication list given to you today.  

## 2013-02-06 NOTE — Progress Notes (Signed)
HPI:  79 year old gentleman presenting for followup of atrial fibrillation. He has a history of stroke and recurrent TIAs. He's been maintained on warfarin for many years.  The patient denies cardiopulmonary symptoms. He specifically denies chest pain, chest pressure, shortness of breath, palpitations, lightheadedness, or syncope.  He is living at [facility]. He moved here from [place] and really likes living in [place]. He's had 2 falls since I saw him last year. He had a rib fracture in the shoulder injury. He has not had any head injuries. He's had no bleeding problems on long-term warfarin.  Outpatient Encounter Prescriptions as of 02/06/2013  Medication Sig  . albuterol (PROVENTIL) (5 MG/ML) 0.5% nebulizer solution Take 0.5 mLs (2.5 mg total) by nebulization every 6 (six) hours as needed for wheezing or shortness of breath.  Marland Kitchen ascorbic acid (VITAMIN C) 1000 MG tablet Take 1,000 mg by mouth daily.  Marland Kitchen atorvastatin (LIPITOR) 10 MG tablet Take 10 mg by mouth daily.  . calcium carbonate (OS-CAL - DOSED IN MG OF ELEMENTAL CALCIUM) 1250 MG tablet Take 1 tablet by mouth daily.  . cholecalciferol (VITAMIN D) 1000 UNITS tablet Take 1,000 Units by mouth daily.  . ciprofloxacin (CIPRO) 500 MG tablet Take 1 tablet (500 mg total) by mouth 2 (two) times daily.  . citalopram (CELEXA) 40 MG tablet Take 40 mg by mouth daily.  . Cyanocobalamin (VITAMIN B 12 PO) Take 500 mg by mouth 2 (two) times daily.  Marland Kitchen esomeprazole (NEXIUM) 40 MG capsule Take 40 mg by mouth daily as needed. For acid reflux  . HYDROcodone-acetaminophen (NORCO/VICODIN) 5-325 MG per tablet Take 1 tablet by mouth every 6 (six) hours as needed for pain.  Marland Kitchen ipratropium (ATROVENT) 0.02 % nebulizer solution Take 2.5 mLs (0.5 mg total) by nebulization every 6 (six) hours as needed.  . lactobacillus acidophilus (BACID) TABS Take 1 tablet by mouth daily.  Marland Kitchen loperamide (IMODIUM) 2 MG capsule Take 1 mg by mouth daily as needed. For  diarrhea  . metoprolol succinate (TOPROL-XL) 50 MG 24 hr tablet Take 50 mg by mouth daily. Take with or immediately following a meal.  . vitamin E 100 UNIT capsule Take 100 Units by mouth daily.  Marland Kitchen warfarin (COUMADIN) 5 MG tablet Take 2.5-5 mg by mouth daily. 2.5 on M,W,F,Sat,SUN.  5mg  on Tuesday and Thursday  . cephALEXin (KEFLEX) 500 MG capsule For self caths - resume after completing Cipro  . [DISCONTINUED] temazepam (RESTORIL) 15 MG capsule Take 15 mg by mouth at bedtime as needed. For sleep    Allergies  Allergen Reactions  . Sulfonamide Derivatives     Past Medical History  Diagnosis Date  . Atrial fibrillation, permanent   . Hyperlipidemia   . Hypertension   . UTI (urinary tract infection)   . Dementia   . Hyperlipidemia   . Bladder cancer     hx of surgery and chemo  . Lung cancer     Status post surgery resection    ROS: Positive for gait instability, otherwise Negative except as per HPI  BP 132/77  Pulse 64  Ht 6\' 6"  (1.981 m)  Wt 210 lb (95.255 kg)  BMI 24.27 kg/m2  PHYSICAL EXAM: Pt is alert and oriented, pleasant elderly gentleman in NAD HEENT: normal Neck: JVP - normal, carotids 2+= without bruits Lungs: CTA bilaterally CV: Irregularly irregular without murmur or gallop Abd: soft, NT, Positive BS, no hepatomegaly Ext: Trace pretibial edema bilaterally, distal pulses intact and equal Skin: warm/dry no rash  EKG:  Atrial fibrillation 64 beats per minute, left axis deviation, possible inferior infarct age undetermined  ASSESSMENT AND PLAN: 1. Permanent atrial fibrillation. The patient is stable on his current medical program with the strategy of rate control and long-term anticoagulation. He is tolerating metoprolol succinate. He's had no bleeding problems on warfarin. We discussed the fact that we need to keep an eye on the risk/benefit with warfarin. Considering his age and history of stroke, I think anticoagulation is clearly indicated with a favorable  risk/benefit ratio. If his falls become more problematic, we may need to reconsider in the future.  2. Hyperlipidemia. He continues on atorvastatin. He's followed by Dr. Clelia Croft.  3. Hypertension. The patient is controlled on metoprolol succinate.  [name] 02/06/2013 2:57 PM

## 2013-02-08 DIAGNOSIS — Z9181 History of falling: Secondary | ICD-10-CM | POA: Diagnosis not present

## 2013-02-08 DIAGNOSIS — R279 Unspecified lack of coordination: Secondary | ICD-10-CM | POA: Diagnosis not present

## 2013-02-08 DIAGNOSIS — M6281 Muscle weakness (generalized): Secondary | ICD-10-CM | POA: Diagnosis not present

## 2013-02-08 DIAGNOSIS — R269 Unspecified abnormalities of gait and mobility: Secondary | ICD-10-CM | POA: Diagnosis not present

## 2013-02-12 DIAGNOSIS — R269 Unspecified abnormalities of gait and mobility: Secondary | ICD-10-CM | POA: Diagnosis not present

## 2013-02-12 DIAGNOSIS — M6281 Muscle weakness (generalized): Secondary | ICD-10-CM | POA: Diagnosis not present

## 2013-02-12 DIAGNOSIS — Z9181 History of falling: Secondary | ICD-10-CM | POA: Diagnosis not present

## 2013-02-12 DIAGNOSIS — R279 Unspecified lack of coordination: Secondary | ICD-10-CM | POA: Diagnosis not present

## 2013-02-19 DIAGNOSIS — Z7901 Long term (current) use of anticoagulants: Secondary | ICD-10-CM | POA: Diagnosis not present

## 2013-02-19 DIAGNOSIS — M899 Disorder of bone, unspecified: Secondary | ICD-10-CM | POA: Diagnosis not present

## 2013-02-19 DIAGNOSIS — I4891 Unspecified atrial fibrillation: Secondary | ICD-10-CM | POA: Diagnosis not present

## 2013-02-19 DIAGNOSIS — M6281 Muscle weakness (generalized): Secondary | ICD-10-CM | POA: Diagnosis not present

## 2013-02-19 DIAGNOSIS — R279 Unspecified lack of coordination: Secondary | ICD-10-CM | POA: Diagnosis not present

## 2013-02-19 DIAGNOSIS — Z9181 History of falling: Secondary | ICD-10-CM | POA: Diagnosis not present

## 2013-02-19 DIAGNOSIS — R269 Unspecified abnormalities of gait and mobility: Secondary | ICD-10-CM | POA: Diagnosis not present

## 2013-02-26 DIAGNOSIS — Z9181 History of falling: Secondary | ICD-10-CM | POA: Diagnosis not present

## 2013-02-26 DIAGNOSIS — R279 Unspecified lack of coordination: Secondary | ICD-10-CM | POA: Diagnosis not present

## 2013-02-26 DIAGNOSIS — R269 Unspecified abnormalities of gait and mobility: Secondary | ICD-10-CM | POA: Diagnosis not present

## 2013-02-26 DIAGNOSIS — M6281 Muscle weakness (generalized): Secondary | ICD-10-CM | POA: Diagnosis not present

## 2013-03-04 DIAGNOSIS — Q638 Other specified congenital malformations of kidney: Secondary | ICD-10-CM | POA: Diagnosis not present

## 2013-03-04 DIAGNOSIS — N1330 Unspecified hydronephrosis: Secondary | ICD-10-CM | POA: Diagnosis not present

## 2013-03-04 DIAGNOSIS — C61 Malignant neoplasm of prostate: Secondary | ICD-10-CM | POA: Diagnosis not present

## 2013-03-04 DIAGNOSIS — Z8551 Personal history of malignant neoplasm of bladder: Secondary | ICD-10-CM | POA: Diagnosis not present

## 2013-03-21 DIAGNOSIS — S43429A Sprain of unspecified rotator cuff capsule, initial encounter: Secondary | ICD-10-CM | POA: Diagnosis not present

## 2013-03-21 DIAGNOSIS — M25519 Pain in unspecified shoulder: Secondary | ICD-10-CM | POA: Diagnosis not present

## 2013-03-27 DIAGNOSIS — Z7901 Long term (current) use of anticoagulants: Secondary | ICD-10-CM | POA: Diagnosis not present

## 2013-03-27 DIAGNOSIS — I4891 Unspecified atrial fibrillation: Secondary | ICD-10-CM | POA: Diagnosis not present

## 2013-03-27 DIAGNOSIS — Z79899 Other long term (current) drug therapy: Secondary | ICD-10-CM | POA: Diagnosis not present

## 2013-03-27 DIAGNOSIS — M810 Age-related osteoporosis without current pathological fracture: Secondary | ICD-10-CM | POA: Diagnosis not present

## 2013-04-01 DIAGNOSIS — Z9181 History of falling: Secondary | ICD-10-CM | POA: Diagnosis not present

## 2013-04-01 DIAGNOSIS — R269 Unspecified abnormalities of gait and mobility: Secondary | ICD-10-CM | POA: Diagnosis not present

## 2013-04-01 DIAGNOSIS — M6281 Muscle weakness (generalized): Secondary | ICD-10-CM | POA: Diagnosis not present

## 2013-04-01 DIAGNOSIS — R279 Unspecified lack of coordination: Secondary | ICD-10-CM | POA: Diagnosis not present

## 2013-04-03 DIAGNOSIS — Z9181 History of falling: Secondary | ICD-10-CM | POA: Diagnosis not present

## 2013-04-03 DIAGNOSIS — M6281 Muscle weakness (generalized): Secondary | ICD-10-CM | POA: Diagnosis not present

## 2013-04-03 DIAGNOSIS — M810 Age-related osteoporosis without current pathological fracture: Secondary | ICD-10-CM | POA: Diagnosis not present

## 2013-04-03 DIAGNOSIS — Z79899 Other long term (current) drug therapy: Secondary | ICD-10-CM | POA: Diagnosis not present

## 2013-04-03 DIAGNOSIS — R269 Unspecified abnormalities of gait and mobility: Secondary | ICD-10-CM | POA: Diagnosis not present

## 2013-04-03 DIAGNOSIS — R279 Unspecified lack of coordination: Secondary | ICD-10-CM | POA: Diagnosis not present

## 2013-04-05 DIAGNOSIS — R269 Unspecified abnormalities of gait and mobility: Secondary | ICD-10-CM | POA: Diagnosis not present

## 2013-04-05 DIAGNOSIS — R279 Unspecified lack of coordination: Secondary | ICD-10-CM | POA: Diagnosis not present

## 2013-04-05 DIAGNOSIS — M6281 Muscle weakness (generalized): Secondary | ICD-10-CM | POA: Diagnosis not present

## 2013-04-05 DIAGNOSIS — Z9181 History of falling: Secondary | ICD-10-CM | POA: Diagnosis not present

## 2013-04-08 DIAGNOSIS — M6281 Muscle weakness (generalized): Secondary | ICD-10-CM | POA: Diagnosis not present

## 2013-04-08 DIAGNOSIS — R279 Unspecified lack of coordination: Secondary | ICD-10-CM | POA: Diagnosis not present

## 2013-04-08 DIAGNOSIS — R269 Unspecified abnormalities of gait and mobility: Secondary | ICD-10-CM | POA: Diagnosis not present

## 2013-04-08 DIAGNOSIS — Z9181 History of falling: Secondary | ICD-10-CM | POA: Diagnosis not present

## 2013-04-10 DIAGNOSIS — Z9181 History of falling: Secondary | ICD-10-CM | POA: Diagnosis not present

## 2013-04-10 DIAGNOSIS — R279 Unspecified lack of coordination: Secondary | ICD-10-CM | POA: Diagnosis not present

## 2013-04-10 DIAGNOSIS — M6281 Muscle weakness (generalized): Secondary | ICD-10-CM | POA: Diagnosis not present

## 2013-04-10 DIAGNOSIS — R269 Unspecified abnormalities of gait and mobility: Secondary | ICD-10-CM | POA: Diagnosis not present

## 2013-04-11 DIAGNOSIS — Z7901 Long term (current) use of anticoagulants: Secondary | ICD-10-CM | POA: Diagnosis not present

## 2013-04-11 DIAGNOSIS — N390 Urinary tract infection, site not specified: Secondary | ICD-10-CM | POA: Diagnosis not present

## 2013-04-11 DIAGNOSIS — I4891 Unspecified atrial fibrillation: Secondary | ICD-10-CM | POA: Diagnosis not present

## 2013-04-11 DIAGNOSIS — R809 Proteinuria, unspecified: Secondary | ICD-10-CM | POA: Diagnosis not present

## 2013-04-12 DIAGNOSIS — R269 Unspecified abnormalities of gait and mobility: Secondary | ICD-10-CM | POA: Diagnosis not present

## 2013-04-12 DIAGNOSIS — M6281 Muscle weakness (generalized): Secondary | ICD-10-CM | POA: Diagnosis not present

## 2013-04-12 DIAGNOSIS — R279 Unspecified lack of coordination: Secondary | ICD-10-CM | POA: Diagnosis not present

## 2013-04-12 DIAGNOSIS — Z9181 History of falling: Secondary | ICD-10-CM | POA: Diagnosis not present

## 2013-04-18 ENCOUNTER — Inpatient Hospital Stay (HOSPITAL_COMMUNITY): Admission: RE | Admit: 2013-04-18 | Payer: Medicare Other | Source: Ambulatory Visit

## 2013-04-18 ENCOUNTER — Other Ambulatory Visit (HOSPITAL_COMMUNITY): Payer: Self-pay | Admitting: Internal Medicine

## 2013-04-19 DIAGNOSIS — Z9181 History of falling: Secondary | ICD-10-CM | POA: Diagnosis not present

## 2013-04-19 DIAGNOSIS — M6281 Muscle weakness (generalized): Secondary | ICD-10-CM | POA: Diagnosis not present

## 2013-04-19 DIAGNOSIS — R269 Unspecified abnormalities of gait and mobility: Secondary | ICD-10-CM | POA: Diagnosis not present

## 2013-04-19 DIAGNOSIS — R279 Unspecified lack of coordination: Secondary | ICD-10-CM | POA: Diagnosis not present

## 2013-04-23 ENCOUNTER — Ambulatory Visit (HOSPITAL_COMMUNITY): Admission: RE | Admit: 2013-04-23 | Payer: Medicare Other | Source: Ambulatory Visit

## 2013-04-25 DIAGNOSIS — Z7901 Long term (current) use of anticoagulants: Secondary | ICD-10-CM | POA: Diagnosis not present

## 2013-04-25 DIAGNOSIS — I4891 Unspecified atrial fibrillation: Secondary | ICD-10-CM | POA: Diagnosis not present

## 2013-04-25 DIAGNOSIS — C679 Malignant neoplasm of bladder, unspecified: Secondary | ICD-10-CM | POA: Diagnosis not present

## 2013-04-26 DIAGNOSIS — R279 Unspecified lack of coordination: Secondary | ICD-10-CM | POA: Diagnosis not present

## 2013-04-26 DIAGNOSIS — R269 Unspecified abnormalities of gait and mobility: Secondary | ICD-10-CM | POA: Diagnosis not present

## 2013-04-26 DIAGNOSIS — Z9181 History of falling: Secondary | ICD-10-CM | POA: Diagnosis not present

## 2013-04-26 DIAGNOSIS — M6281 Muscle weakness (generalized): Secondary | ICD-10-CM | POA: Diagnosis not present

## 2013-04-29 DIAGNOSIS — M6281 Muscle weakness (generalized): Secondary | ICD-10-CM | POA: Diagnosis not present

## 2013-04-29 DIAGNOSIS — R279 Unspecified lack of coordination: Secondary | ICD-10-CM | POA: Diagnosis not present

## 2013-04-29 DIAGNOSIS — Z9181 History of falling: Secondary | ICD-10-CM | POA: Diagnosis not present

## 2013-04-29 DIAGNOSIS — R269 Unspecified abnormalities of gait and mobility: Secondary | ICD-10-CM | POA: Diagnosis not present

## 2013-05-06 DIAGNOSIS — R109 Unspecified abdominal pain: Secondary | ICD-10-CM | POA: Diagnosis not present

## 2013-05-06 DIAGNOSIS — N183 Chronic kidney disease, stage 3 unspecified: Secondary | ICD-10-CM | POA: Diagnosis not present

## 2013-05-06 DIAGNOSIS — I4891 Unspecified atrial fibrillation: Secondary | ICD-10-CM | POA: Diagnosis not present

## 2013-05-06 DIAGNOSIS — R809 Proteinuria, unspecified: Secondary | ICD-10-CM | POA: Diagnosis not present

## 2013-05-06 DIAGNOSIS — N390 Urinary tract infection, site not specified: Secondary | ICD-10-CM | POA: Diagnosis not present

## 2013-05-06 DIAGNOSIS — Z7901 Long term (current) use of anticoagulants: Secondary | ICD-10-CM | POA: Diagnosis not present

## 2013-05-06 DIAGNOSIS — Z6829 Body mass index (BMI) 29.0-29.9, adult: Secondary | ICD-10-CM | POA: Diagnosis not present

## 2013-05-15 ENCOUNTER — Ambulatory Visit (HOSPITAL_COMMUNITY)
Admission: RE | Admit: 2013-05-15 | Discharge: 2013-05-15 | Disposition: A | Discharge status: Home / Self Care | Payer: Medicare Other | Source: Ambulatory Visit | Attending: Internal Medicine | Admitting: Internal Medicine

## 2013-05-15 ENCOUNTER — Other Ambulatory Visit (HOSPITAL_COMMUNITY): Payer: Self-pay | Admitting: Internal Medicine

## 2013-05-15 ENCOUNTER — Encounter (HOSPITAL_COMMUNITY): Payer: Self-pay

## 2013-05-15 DIAGNOSIS — M810 Age-related osteoporosis without current pathological fracture: Secondary | ICD-10-CM | POA: Insufficient documentation

## 2013-05-15 MED ORDER — ZOLEDRONIC ACID 5 MG/100ML IV SOLN
5.0000 mg | Freq: Once | INTRAVENOUS | Status: AC
  Administered 2013-05-15: 5 mg via INTRAVENOUS
  Filled 2013-05-15: qty 100

## 2013-05-15 MED ORDER — SODIUM CHLORIDE 0.9 % IV SOLN
Freq: Once | INTRAVENOUS | Status: AC
  Administered 2013-05-15: 13:00:00 via INTRAVENOUS

## 2013-05-15 NOTE — Discharge Instructions (Signed)

## 2013-05-16 ENCOUNTER — Telehealth: Payer: Self-pay | Admitting: Hematology and Oncology

## 2013-05-16 NOTE — Telephone Encounter (Signed)
RETURNED CALL TO DTR HEIDI WHO CALL REQUESTED AN APPT. PER HEIDI HER DAD IS A FORMER [facility] PT JUST BACK FROM [place] AND NEEDS APPT. DTR GIVEN APPT W/NG FOR 4/9 @ 1PM.

## 2013-05-20 DIAGNOSIS — Z7901 Long term (current) use of anticoagulants: Secondary | ICD-10-CM | POA: Diagnosis not present

## 2013-05-20 DIAGNOSIS — I4891 Unspecified atrial fibrillation: Secondary | ICD-10-CM | POA: Diagnosis not present

## 2013-05-29 DIAGNOSIS — Z7901 Long term (current) use of anticoagulants: Secondary | ICD-10-CM | POA: Diagnosis not present

## 2013-05-29 DIAGNOSIS — I4891 Unspecified atrial fibrillation: Secondary | ICD-10-CM | POA: Diagnosis not present

## 2013-05-30 ENCOUNTER — Encounter: Payer: Self-pay | Admitting: Hematology and Oncology

## 2013-05-30 ENCOUNTER — Telehealth: Payer: Self-pay | Admitting: Hematology and Oncology

## 2013-05-30 ENCOUNTER — Ambulatory Visit (HOSPITAL_BASED_OUTPATIENT_CLINIC_OR_DEPARTMENT_OTHER): Payer: Medicare Other

## 2013-05-30 ENCOUNTER — Ambulatory Visit (HOSPITAL_BASED_OUTPATIENT_CLINIC_OR_DEPARTMENT_OTHER): Payer: Medicare Other | Admitting: Hematology and Oncology

## 2013-05-30 ENCOUNTER — Ambulatory Visit (HOSPITAL_COMMUNITY)
Admission: RE | Admit: 2013-05-30 | Discharge: 2013-05-30 | Disposition: A | Discharge status: Home / Self Care | Payer: Medicare Other | Source: Ambulatory Visit | Attending: Hematology and Oncology | Admitting: Hematology and Oncology

## 2013-05-30 VITALS — BP 138/90 | HR 68 | Temp 97.0°F | Resp 18 | Ht 72.0 in | Wt 218.3 lb

## 2013-05-30 DIAGNOSIS — I517 Cardiomegaly: Secondary | ICD-10-CM | POA: Diagnosis not present

## 2013-05-30 DIAGNOSIS — C679 Malignant neoplasm of bladder, unspecified: Secondary | ICD-10-CM

## 2013-05-30 DIAGNOSIS — Z85118 Personal history of other malignant neoplasm of bronchus and lung: Secondary | ICD-10-CM

## 2013-05-30 DIAGNOSIS — Z8551 Personal history of malignant neoplasm of bladder: Secondary | ICD-10-CM | POA: Diagnosis not present

## 2013-05-30 DIAGNOSIS — I7090 Unspecified atherosclerosis: Secondary | ICD-10-CM | POA: Insufficient documentation

## 2013-05-30 DIAGNOSIS — C3490 Malignant neoplasm of unspecified part of unspecified bronchus or lung: Secondary | ICD-10-CM | POA: Insufficient documentation

## 2013-05-30 DIAGNOSIS — J984 Other disorders of lung: Secondary | ICD-10-CM | POA: Diagnosis not present

## 2013-05-30 LAB — CBC WITH DIFFERENTIAL/PLATELET
BASO%: 0.3 % (ref 0.0–2.0)
Basophils Absolute: 0 10*3/uL (ref 0.0–0.1)
EOS ABS: 0.2 10*3/uL (ref 0.0–0.5)
EOS%: 1.9 % (ref 0.0–7.0)
HEMATOCRIT: 45.3 % (ref 38.4–49.9)
HGB: 14.3 g/dL (ref 13.0–17.1)
LYMPH%: 11.8 % — AB (ref 14.0–49.0)
MCH: 29.1 pg (ref 27.2–33.4)
MCHC: 31.6 g/dL — AB (ref 32.0–36.0)
MCV: 92.3 fL (ref 79.3–98.0)
MONO#: 1 10*3/uL — ABNORMAL HIGH (ref 0.1–0.9)
MONO%: 9.9 % (ref 0.0–14.0)
NEUT#: 7.5 10*3/uL — ABNORMAL HIGH (ref 1.5–6.5)
NEUT%: 76.1 % — AB (ref 39.0–75.0)
PLATELETS: 197 10*3/uL (ref 140–400)
RBC: 4.91 10*6/uL (ref 4.20–5.82)
RDW: 14.6 % (ref 11.0–14.6)
WBC: 9.8 10*3/uL (ref 4.0–10.3)
lymph#: 1.2 10*3/uL (ref 0.9–3.3)

## 2013-05-30 LAB — COMPREHENSIVE METABOLIC PANEL (CC13)
ALK PHOS: 61 U/L (ref 40–150)
ALT: 10 U/L (ref 0–55)
ANION GAP: 10 meq/L (ref 3–11)
AST: 16 U/L (ref 5–34)
Albumin: 3.7 g/dL (ref 3.5–5.0)
BILIRUBIN TOTAL: 1.04 mg/dL (ref 0.20–1.20)
BUN: 18.8 mg/dL (ref 7.0–26.0)
CO2: 23 meq/L (ref 22–29)
CREATININE: 1.2 mg/dL (ref 0.7–1.3)
Calcium: 9.1 mg/dL (ref 8.4–10.4)
Chloride: 111 mEq/L — ABNORMAL HIGH (ref 98–109)
GLUCOSE: 109 mg/dL (ref 70–140)
Potassium: 4.6 mEq/L (ref 3.5–5.1)
SODIUM: 144 meq/L (ref 136–145)
TOTAL PROTEIN: 7 g/dL (ref 6.4–8.3)

## 2013-05-30 NOTE — Progress Notes (Signed)
[facility] FOLLOW-UP progress notes  Patient Care Team: [name], MD as PCP - General (Internal Medicine)  CHIEF COMPLAINTS/PURPOSE OF VISIT:  To establish care, history of bladder cancer and lung cancer  HISTORY OF PRESENTING ILLNESS:  [name] 80 y.o. male was transferred to my care after his prior physician has left.  I do not have much records related to his past history of cancer. The patient is a poor historian. Summary of his oncologic history is as follows: Cystoprostatectomy with ileal conduit reconstruction 04/22/2000, followed by six cycles of Gemzar and Navelbine completed on 04/05/01. 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 This was treated surgically.   He denies cough. No hemptysis. The patient denies any recent signs or symptoms of bleeding such as spontaneous epistaxis, hematuria or hematochezia.  MEDICAL HISTORY:  Past Medical History  Diagnosis Date  . Atrial fibrillation, permanent   . Hyperlipidemia   . Hypertension   . UTI (urinary tract infection)   . Dementia   . Hyperlipidemia   . Bladder cancer     hx of surgery and chemo  . Lung cancer     Status post surgery resection    SURGICAL HISTORY: Past Surgical History  Procedure Laterality Date  . Hernia repair  2006  . Cataract extraction, bilateral  1998, 2000  . Cystectomy  2002  . Lung removal, partial      SOCIAL HISTORY: History   Social History  . Marital Status: Married    Spouse Name: N/A    Number of Children: N/A  . Years of Education: N/A   Occupational History  . Not on file.   Social History Main Topics  . Smoking status: Former Smoker    Quit date: 10/10/1991  . Smokeless tobacco: Never Used  . Alcohol Use: No  . Drug Use: No  . Sexual Activity: Not Currently   Other Topics Concern  . Not on file   Social History Narrative   The patient has 3 children. He lives in [place]  [facility]. 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 He is a remote smoker. He drinks very rare alcohol. He has grandchildren and great-grandchildren in the area.    FAMILY HISTORY: Family History  Problem Relation Age of Onset  . Cancer Maternal Aunt     unknown  . Cancer Maternal Grandmother     unknown    ALLERGIES:  is allergic to sulfonamide derivatives.  MEDICATIONS:  Current Outpatient Prescriptions  Medication Sig Dispense Refill  . ascorbic acid (VITAMIN C) 1000 MG tablet Take 1,000 mg by mouth daily.      Marland Kitchen atorvastatin (LIPITOR) 10 MG tablet Take 10 mg by mouth daily.      . calcium carbonate (OS-CAL - DOSED IN MG OF ELEMENTAL CALCIUM) 1250 MG tablet Take 1 tablet by mouth daily.      . cholecalciferol (VITAMIN D) 1000 UNITS tablet Take 1,000 Units by mouth daily.      . Cyanocobalamin (VITAMIN B 12 PO) Take 500 mg by mouth 2 (two) times daily.      Marland Kitchen esomeprazole (NEXIUM) 40 MG capsule Take 40 mg by mouth daily as needed. For acid reflux      . HYDROcodone-acetaminophen (NORCO/VICODIN) 5-325 MG per tablet Take 1 tablet by mouth every 6 (six) hours as needed for pain.  20 tablet  0  . metoprolol succinate (TOPROL-XL) 50 MG 24 hr tablet  Take 50 mg by mouth daily. Take with or immediately following a meal.      . vitamin E 100 UNIT capsule Take 100 Units by mouth daily.      Marland Kitchen warfarin (COUMADIN) 5 MG tablet Take 2.5-5 mg by mouth daily. 2.5 on M,W,F,Sat,SUN.  5mg  on Tuesday and Thursday       No current facility-administered medications for this visit.    REVIEW OF SYSTEMS:   Constitutional: Denies fevers, chills or abnormal night sweats Eyes: Denies blurriness of vision, double vision or watery eyes Ears, nose, mouth, throat, and face: Denies mucositis or sore throat Respiratory: Denies cough, dyspnea or wheezes Cardiovascular: Denies palpitation, chest discomfort or lower extremity swelling Gastrointestinal:  Denies nausea, heartburn or  change in bowel habits Skin: Denies abnormal skin rashes Lymphatics: Denies new lymphadenopathy or easy bruising Neurological:Denies numbness, tingling or new weaknesses Behavioral/Psych: Mood is stable, no new changes  All other systems were reviewed with the patient and are negative.  PHYSICAL EXAMINATION: ECOG PERFORMANCE STATUS: 0 - Asymptomatic  Filed Vitals:   05/30/13 1305  BP: 138/90  Pulse: 68  Temp: 97 F (36.1 C)  Resp: 18   Filed Weights   05/30/13 1305  Weight: 218 lb 4.8 oz (99.02 kg)    GENERAL:alert, no distress and comfortable. Elderly, well nourished SKIN: skin color, texture, turgor are normal, no rashes or significant lesions EYES: normal, conjunctiva are pink and non-injected, sclera clear OROPHARYNX:no exudate, normal lips, buccal mucosa, and tongue  NECK: supple, thyroid normal size, non-tender, without nodularity LYMPH:  no palpable lymphadenopathy in the cervical, axillary or inguinal LUNGS: clear to auscultation and percussion with normal breathing effort HEART: regular rate & rhythm and no murmurs without lower extremity edema ABDOMEN:abdomen soft, non-tender and normal bowel sounds. Well healed scars Musculoskeletal:no cyanosis of digits and no clubbing  PSYCH: alert & oriented x 3 with fluent speech, poor memory NEURO: no focal motor/sensory deficits  LABORATORY DATA:  I have reviewed the data as listed Lab Results  Component Value Date   WBC 9.8 05/30/2013   HGB 14.3 05/30/2013   HCT 45.3 05/30/2013   MCV 92.3 05/30/2013   PLT 197 05/30/2013    Recent Labs  05/30/13 1415  NA 144  K 4.6  CO2 23  GLUCOSE 109  BUN 18.8  CREATININE 1.2  CALCIUM 9.1  PROT 7.0  ALBUMIN 3.7  AST 16  ALT 10  ALKPHOS 61  BILITOT 1.04    RADIOGRAPHIC STUDIES: I have personally reviewed the radiological images as listed and agreed with the findings in the report. Dg Chest 2 View  05/30/2013   CLINICAL DATA:  Lung cancer.  Bladder cancer.  EXAM: CHEST  2 VIEW   COMPARISON:  DG CHEST 2 VIEW dated 05/02/2012; DG RIBS UNILATERAL W/CHEST*L* dated 12/30/2011  FINDINGS: Mediastinum and hilar structures are normal. Pleural-parenchymal scarring on the left noted. Surgical clips noted on the left. No pleural effusion or pneumothorax. No acute infiltrate. Stable cardiomegaly. No acute bony abnormality. Carotid atherosclerotic vascular calcification. Degenerative changes thoracic spine .  IMPRESSION: 1. Stable chest with pleural-parenchymal scarring and postsurgical change on the left.  2. Carotid atherosclerotic vascular disease.   Electronically Signed   By: Marcello Moores  Register   On: 05/30/2013 14:40    ASSESSMENT & PLAN:  #1 History of lung cancer I will order labs and CXR. Clinically he has no recurrence #2 History of bladder cancer Clinically he has no recurrence  Orders Placed This Encounter  Procedures  .  DG Chest 2 View    Standing Status: Standing     Number of Occurrences: 2     Standing Expiration Date: 05/30/2014    Order Specific Question:  Reason for exam:    Answer:  hx l;ung ca s/p resection, r/o recurrence    Order Specific Question:  Preferred imaging location?    Answer:  [facility]  . CBC with Differential    Standing Status: Standing     Number of Occurrences: 2     Standing Expiration Date: 05/31/2014  . Comprehensive metabolic panel    Standing Status: Standing     Number of Occurrences: 2     Standing Expiration Date: 05/31/2014    All questions were answered. The patient knows to call the clinic with any problems, questions or concerns. I spent 25 minutes counseling the patient face to face. The total time spent in the appointment was 30 minutes and more than 50% was on counseling.     Heath Lark, MD 05/30/2013 7:14 PM

## 2013-05-30 NOTE — Telephone Encounter (Signed)
Gave pt appt for October 2015, sent pt to lab today and x-ray

## 2013-06-20 DIAGNOSIS — Z7901 Long term (current) use of anticoagulants: Secondary | ICD-10-CM | POA: Diagnosis not present

## 2013-06-20 DIAGNOSIS — G479 Sleep disorder, unspecified: Secondary | ICD-10-CM | POA: Diagnosis not present

## 2013-06-20 DIAGNOSIS — I4891 Unspecified atrial fibrillation: Secondary | ICD-10-CM | POA: Diagnosis not present

## 2013-07-19 ENCOUNTER — Telehealth: Payer: Self-pay | Admitting: Medical Oncology

## 2013-07-19 NOTE — Telephone Encounter (Signed)
Attempted to return call to patient's daughter. LVMOM.

## 2013-07-19 NOTE — Telephone Encounter (Signed)
Daughter, Ishmael Holter, called regarding patient's visit

## 2013-07-22 DIAGNOSIS — Z7901 Long term (current) use of anticoagulants: Secondary | ICD-10-CM | POA: Diagnosis not present

## 2013-07-22 DIAGNOSIS — I4891 Unspecified atrial fibrillation: Secondary | ICD-10-CM | POA: Diagnosis not present

## 2013-08-30 DIAGNOSIS — R82998 Other abnormal findings in urine: Secondary | ICD-10-CM | POA: Diagnosis not present

## 2013-08-30 DIAGNOSIS — Z8551 Personal history of malignant neoplasm of bladder: Secondary | ICD-10-CM | POA: Diagnosis not present

## 2013-08-30 DIAGNOSIS — C679 Malignant neoplasm of bladder, unspecified: Secondary | ICD-10-CM | POA: Diagnosis not present

## 2013-08-30 DIAGNOSIS — K8020 Calculus of gallbladder without cholecystitis without obstruction: Secondary | ICD-10-CM | POA: Diagnosis not present

## 2013-08-30 DIAGNOSIS — N200 Calculus of kidney: Secondary | ICD-10-CM | POA: Diagnosis not present

## 2013-08-30 DIAGNOSIS — N1330 Unspecified hydronephrosis: Secondary | ICD-10-CM | POA: Diagnosis not present

## 2013-09-04 DIAGNOSIS — C61 Malignant neoplasm of prostate: Secondary | ICD-10-CM | POA: Diagnosis not present

## 2013-09-04 DIAGNOSIS — R82998 Other abnormal findings in urine: Secondary | ICD-10-CM | POA: Diagnosis not present

## 2013-09-04 DIAGNOSIS — N200 Calculus of kidney: Secondary | ICD-10-CM | POA: Diagnosis not present

## 2013-09-04 DIAGNOSIS — K912 Postsurgical malabsorption, not elsewhere classified: Secondary | ICD-10-CM | POA: Diagnosis not present

## 2013-09-04 DIAGNOSIS — Z8551 Personal history of malignant neoplasm of bladder: Secondary | ICD-10-CM | POA: Diagnosis not present

## 2013-09-30 DIAGNOSIS — E785 Hyperlipidemia, unspecified: Secondary | ICD-10-CM | POA: Diagnosis not present

## 2013-09-30 DIAGNOSIS — Z6829 Body mass index (BMI) 29.0-29.9, adult: Secondary | ICD-10-CM | POA: Diagnosis not present

## 2013-09-30 DIAGNOSIS — I4891 Unspecified atrial fibrillation: Secondary | ICD-10-CM | POA: Diagnosis not present

## 2013-09-30 DIAGNOSIS — R7301 Impaired fasting glucose: Secondary | ICD-10-CM | POA: Diagnosis not present

## 2013-09-30 DIAGNOSIS — Z7901 Long term (current) use of anticoagulants: Secondary | ICD-10-CM | POA: Diagnosis not present

## 2013-09-30 DIAGNOSIS — N183 Chronic kidney disease, stage 3 unspecified: Secondary | ICD-10-CM | POA: Diagnosis not present

## 2013-09-30 DIAGNOSIS — M810 Age-related osteoporosis without current pathological fracture: Secondary | ICD-10-CM | POA: Diagnosis not present

## 2013-10-03 ENCOUNTER — Other Ambulatory Visit: Payer: Self-pay | Admitting: Dermatology

## 2013-10-03 DIAGNOSIS — Z85828 Personal history of other malignant neoplasm of skin: Secondary | ICD-10-CM | POA: Diagnosis not present

## 2013-10-03 DIAGNOSIS — C4401 Basal cell carcinoma of skin of lip: Secondary | ICD-10-CM | POA: Diagnosis not present

## 2013-10-03 DIAGNOSIS — L821 Other seborrheic keratosis: Secondary | ICD-10-CM | POA: Diagnosis not present

## 2013-10-03 DIAGNOSIS — D239 Other benign neoplasm of skin, unspecified: Secondary | ICD-10-CM | POA: Diagnosis not present

## 2013-10-03 DIAGNOSIS — L570 Actinic keratosis: Secondary | ICD-10-CM | POA: Diagnosis not present

## 2013-11-04 DIAGNOSIS — I4891 Unspecified atrial fibrillation: Secondary | ICD-10-CM | POA: Diagnosis not present

## 2013-11-04 DIAGNOSIS — Z7901 Long term (current) use of anticoagulants: Secondary | ICD-10-CM | POA: Diagnosis not present

## 2013-11-29 ENCOUNTER — Ambulatory Visit (HOSPITAL_BASED_OUTPATIENT_CLINIC_OR_DEPARTMENT_OTHER): Payer: Medicare Other | Admitting: Hematology and Oncology

## 2013-11-29 ENCOUNTER — Other Ambulatory Visit (HOSPITAL_BASED_OUTPATIENT_CLINIC_OR_DEPARTMENT_OTHER): Payer: Medicare Other

## 2013-11-29 ENCOUNTER — Telehealth: Payer: Self-pay | Admitting: Hematology and Oncology

## 2013-11-29 ENCOUNTER — Encounter: Payer: Self-pay | Admitting: Hematology and Oncology

## 2013-11-29 VITALS — BP 122/90 | HR 67 | Temp 97.5°F | Resp 18 | Ht 72.0 in | Wt 206.5 lb

## 2013-11-29 DIAGNOSIS — C679 Malignant neoplasm of bladder, unspecified: Secondary | ICD-10-CM

## 2013-11-29 DIAGNOSIS — Z8551 Personal history of malignant neoplasm of bladder: Secondary | ICD-10-CM | POA: Diagnosis not present

## 2013-11-29 DIAGNOSIS — C3490 Malignant neoplasm of unspecified part of unspecified bronchus or lung: Secondary | ICD-10-CM

## 2013-11-29 DIAGNOSIS — Z85118 Personal history of other malignant neoplasm of bronchus and lung: Secondary | ICD-10-CM

## 2013-11-29 LAB — CBC WITH DIFFERENTIAL/PLATELET
BASO%: 0.2 % (ref 0.0–2.0)
BASOS ABS: 0 10*3/uL (ref 0.0–0.1)
EOS ABS: 0.2 10*3/uL (ref 0.0–0.5)
EOS%: 2.3 % (ref 0.0–7.0)
HEMATOCRIT: 46 % (ref 38.4–49.9)
HEMOGLOBIN: 15 g/dL (ref 13.0–17.1)
LYMPH#: 1.6 10*3/uL (ref 0.9–3.3)
LYMPH%: 16 % (ref 14.0–49.0)
MCH: 29.9 pg (ref 27.2–33.4)
MCHC: 32.6 g/dL (ref 32.0–36.0)
MCV: 91.6 fL (ref 79.3–98.0)
MONO#: 0.9 10*3/uL (ref 0.1–0.9)
MONO%: 9.2 % (ref 0.0–14.0)
NEUT%: 72.3 % (ref 39.0–75.0)
NEUTROS ABS: 7.2 10*3/uL — AB (ref 1.5–6.5)
PLATELETS: 197 10*3/uL (ref 140–400)
RBC: 5.02 10*6/uL (ref 4.20–5.82)
RDW: 14.4 % (ref 11.0–14.6)
WBC: 9.9 10*3/uL (ref 4.0–10.3)

## 2013-11-29 LAB — COMPREHENSIVE METABOLIC PANEL (CC13)
ALT: 13 U/L (ref 0–55)
ANION GAP: 9 meq/L (ref 3–11)
AST: 17 U/L (ref 5–34)
Albumin: 3.5 g/dL (ref 3.5–5.0)
Alkaline Phosphatase: 53 U/L (ref 40–150)
BILIRUBIN TOTAL: 1.04 mg/dL (ref 0.20–1.20)
BUN: 27.8 mg/dL — AB (ref 7.0–26.0)
CALCIUM: 10.1 mg/dL (ref 8.4–10.4)
CHLORIDE: 106 meq/L (ref 98–109)
CO2: 25 meq/L (ref 22–29)
CREATININE: 1.4 mg/dL — AB (ref 0.7–1.3)
GLUCOSE: 110 mg/dL (ref 70–140)
Potassium: 4.4 mEq/L (ref 3.5–5.1)
Sodium: 140 mEq/L (ref 136–145)
Total Protein: 7 g/dL (ref 6.4–8.3)

## 2013-11-29 NOTE — Assessment & Plan Note (Signed)
Clinically, he has no recurrence. We'll observe only.

## 2013-11-29 NOTE — Assessment & Plan Note (Signed)
Clinically, no signs of recurrence. I will see him back in one year with history, physical examination, blood work and chest x-ray

## 2013-11-29 NOTE — Progress Notes (Signed)
[facility] OFFICE PROGRESS NOTE  Patient Care Team: [name], MD as PCP - General (Internal Medicine)  SUMMARY OF ONCOLOGIC HISTORY: This patient had diagnosis of bladder cancer, status post cystoprostatectomy with ileal conduit reconstruction 04/22/2000, followed by six cycles of Gemzar and Navelbine completed on 04/05/01. 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 This was treated surgically in July of 2011   INTERVAL HISTORY: Please see below for problem oriented charting. He's doing well. He denies cough. No hemptysis. The patient denies any recent signs or symptoms of bleeding such as spontaneous epistaxis, hematuria or hematochezia.  REVIEW OF SYSTEMS:   Constitutional: Denies fevers, chills or abnormal weight loss Eyes: Denies blurriness of vision Ears, nose, mouth, throat, and face: Denies mucositis or sore throat Respiratory: Denies cough, dyspnea or wheezes Cardiovascular: Denies palpitation, chest discomfort or lower extremity swelling Gastrointestinal:  Denies nausea, heartburn or change in bowel habits Skin: Denies abnormal skin rashes Lymphatics: Denies new lymphadenopathy or easy bruising Neurological:Denies numbness, tingling or new weaknesses Behavioral/Psych: Mood is stable, no new changes  All other systems were reviewed with the patient and are negative.  I have reviewed the past medical history, past surgical history, social history and family history with the patient and they are unchanged from previous note.  ALLERGIES:  is allergic to sulfonamide derivatives.  MEDICATIONS:  Current Outpatient Prescriptions  Medication Sig Dispense Refill  . ascorbic acid (VITAMIN C) 1000 MG tablet Take 1,000 mg by mouth daily.      Marland Kitchen atorvastatin (LIPITOR) 10 MG tablet Take 10 mg by mouth daily.      . calcium carbonate (OS-CAL - DOSED IN MG OF ELEMENTAL CALCIUM) 1250 MG tablet Take 1 tablet by mouth  daily.      . cephALEXin (KEFLEX) 500 MG capsule Take 500 mg by mouth daily.      . cholecalciferol (VITAMIN D) 1000 UNITS tablet Take 1,000 Units by mouth daily.      . citalopram (CELEXA) 40 MG tablet Take 40 mg by mouth daily.      . Cyanocobalamin (VITAMIN B 12 PO) Take 500 mg by mouth 2 (two) times daily.      Marland Kitchen esomeprazole (NEXIUM) 40 MG capsule Take 40 mg by mouth daily as needed. For acid reflux      . HYDROcodone-acetaminophen (NORCO/VICODIN) 5-325 MG per tablet Take 1 tablet by mouth every 6 (six) hours as needed for pain.  20 tablet  0  . hydrocortisone valerate cream (WESTCORT) 0.2 % Apply 1 application topically 2 (two) times daily.      . metoprolol succinate (TOPROL-XL) 50 MG 24 hr tablet Take 50 mg by mouth daily. Take with or immediately following a meal.      . vitamin E 100 UNIT capsule Take 100 Units by mouth daily.      Marland Kitchen warfarin (COUMADIN) 5 MG tablet Take 2.5-5 mg by mouth daily. 2.5 on M,W,F,Sat,SUN.  5mg  on Tuesday and Thursday       No current facility-administered medications for this visit.    PHYSICAL EXAMINATION: ECOG PERFORMANCE STATUS: 0 - Asymptomatic  Filed Vitals:   11/29/13 0916  BP: 122/90  Pulse: 67  Temp: 97.5 F (36.4 C)  Resp: 18   Filed Weights   11/29/13 0916  Weight: 206 lb 8 oz (93.668 kg)    GENERAL:alert, no distress and comfortable SKIN: skin color, texture, turgor are normal, no rashes or significant lesions EYES: normal, Conjunctiva are pink  and non-injected, sclera clear OROPHARYNX:no exudate, no erythema and lips, buccal mucosa, and tongue normal  NECK: supple, thyroid normal size, non-tender, without nodularity LYMPH:  no palpable lymphadenopathy in the cervical, axillary or inguinal LUNGS: clear to auscultation and percussion with normal breathing effort HEART: regular rate & rhythm and no murmurs and no lower extremity edema ABDOMEN:abdomen soft, non-tender and normal bowel sounds Musculoskeletal:no cyanosis of digits  and no clubbing  NEURO: alert & oriented x 3 with fluent speech, no focal motor/sensory deficits  LABORATORY DATA:  I have reviewed the data as listed    Component Value Date/Time   NA 140 11/29/2013 0855   NA 137 05/03/2012 0515   K 4.4 11/29/2013 0855   K 3.5 05/03/2012 0515   CL 100 05/03/2012 0515   CO2 25 11/29/2013 0855   CO2 26 05/03/2012 0515   GLUCOSE 110 11/29/2013 0855   GLUCOSE 107* 05/03/2012 0515   BUN 27.8* 11/29/2013 0855   BUN 20 05/03/2012 0515   CREATININE 1.4* 11/29/2013 0855   CREATININE 1.11 05/03/2012 0515   CALCIUM 10.1 11/29/2013 0855   CALCIUM 9.1 05/03/2012 0515   PROT 7.0 11/29/2013 0855   PROT 6.7 04/30/2012 0118   ALBUMIN 3.5 11/29/2013 0855   ALBUMIN 3.3* 04/30/2012 0118   AST 17 11/29/2013 0855   AST 34 04/30/2012 0118   ALT 13 11/29/2013 0855   ALT 15 04/30/2012 0118   ALKPHOS 53 11/29/2013 0855   ALKPHOS 53 04/30/2012 0118   BILITOT 1.04 11/29/2013 0855   BILITOT 0.9 04/30/2012 0118   GFRNONAA 61* 05/03/2012 0515   GFRAA 71* 05/03/2012 0515    No results found for this basename: SPEP,  UPEP,   kappa and lambda light chains    Lab Results  Component Value Date   WBC 9.9 11/29/2013   NEUTROABS 7.2* 11/29/2013   HGB 15.0 11/29/2013   HCT 46.0 11/29/2013   MCV 91.6 11/29/2013   PLT 197 11/29/2013      Chemistry      Component Value Date/Time   NA 140 11/29/2013 0855   NA 137 05/03/2012 0515   K 4.4 11/29/2013 0855   K 3.5 05/03/2012 0515   CL 100 05/03/2012 0515   CO2 25 11/29/2013 0855   CO2 26 05/03/2012 0515   BUN 27.8* 11/29/2013 0855   BUN 20 05/03/2012 0515   CREATININE 1.4* 11/29/2013 0855   CREATININE 1.11 05/03/2012 0515      Component Value Date/Time   CALCIUM 10.1 11/29/2013 0855   CALCIUM 9.1 05/03/2012 0515   ALKPHOS 53 11/29/2013 0855   ALKPHOS 53 04/30/2012 0118   AST 17 11/29/2013 0855   AST 34 04/30/2012 0118   ALT 13 11/29/2013 0855   ALT 15 04/30/2012 0118   BILITOT 1.04 11/29/2013 0855   BILITOT 0.9 04/30/2012 0118      ASSESSMENT & PLAN:   Malignant neoplasm of bladder Clinically, he has no recurrence. We'll observe only.  Lung cancer  Clinically, no signs of recurrence. I will see him back in one year with history, physical examination, blood work and chest x-ray    Orders Placed This Encounter  Procedures  . DG Chest 2 View    Standing Status: Future     Number of Occurrences:      Standing Expiration Date: 01/03/2015    Order Specific Question:  Reason for exam:    Answer:  cough, hx lung cancer    Order Specific Question:  Preferred imaging location?  Answer:  [facility]  . CBC with Differential    Standing Status: Future     Number of Occurrences:      Standing Expiration Date: 01/03/2015  . Comprehensive metabolic panel    Standing Status: Future     Number of Occurrences:      Standing Expiration Date: 01/03/2015   All questions were answered. The patient knows to call the clinic with any problems, questions or concerns. No barriers to learning was detected. I spent 15 minutes counseling the patient face to face. The total time spent in the appointment was 20 minutes and more than 50% was on counseling and review of test results     [facility], [place], MD 11/29/2013 12:11 PM

## 2013-11-29 NOTE — Telephone Encounter (Signed)
gv pt/dtr appt schedule for july 2016. pt to have cxr 09/02/14 prior to coming to lb/NG appt @ 12:30pm.

## 2013-12-23 DIAGNOSIS — F329 Major depressive disorder, single episode, unspecified: Secondary | ICD-10-CM | POA: Diagnosis not present

## 2013-12-23 DIAGNOSIS — Z1389 Encounter for screening for other disorder: Secondary | ICD-10-CM | POA: Diagnosis not present

## 2013-12-23 DIAGNOSIS — I480 Paroxysmal atrial fibrillation: Secondary | ICD-10-CM | POA: Diagnosis not present

## 2013-12-23 DIAGNOSIS — R269 Unspecified abnormalities of gait and mobility: Secondary | ICD-10-CM | POA: Diagnosis not present

## 2013-12-23 DIAGNOSIS — Z6828 Body mass index (BMI) 28.0-28.9, adult: Secondary | ICD-10-CM | POA: Diagnosis not present

## 2013-12-23 DIAGNOSIS — Z7901 Long term (current) use of anticoagulants: Secondary | ICD-10-CM | POA: Diagnosis not present

## 2014-01-28 DIAGNOSIS — Z23 Encounter for immunization: Secondary | ICD-10-CM | POA: Diagnosis not present

## 2014-01-28 DIAGNOSIS — Z7901 Long term (current) use of anticoagulants: Secondary | ICD-10-CM | POA: Diagnosis not present

## 2014-01-28 DIAGNOSIS — I480 Paroxysmal atrial fibrillation: Secondary | ICD-10-CM | POA: Diagnosis not present

## 2014-01-28 DIAGNOSIS — R531 Weakness: Secondary | ICD-10-CM | POA: Diagnosis not present

## 2014-01-29 DIAGNOSIS — C679 Malignant neoplasm of bladder, unspecified: Secondary | ICD-10-CM | POA: Diagnosis not present

## 2014-01-29 DIAGNOSIS — Z0000 Encounter for general adult medical examination without abnormal findings: Secondary | ICD-10-CM | POA: Diagnosis not present

## 2014-01-29 DIAGNOSIS — R358 Other polyuria: Secondary | ICD-10-CM | POA: Diagnosis not present

## 2014-02-05 DIAGNOSIS — H35363 Drusen (degenerative) of macula, bilateral: Secondary | ICD-10-CM | POA: Diagnosis not present

## 2014-02-05 DIAGNOSIS — Z961 Presence of intraocular lens: Secondary | ICD-10-CM | POA: Diagnosis not present

## 2014-02-28 DIAGNOSIS — Z7901 Long term (current) use of anticoagulants: Secondary | ICD-10-CM | POA: Diagnosis not present

## 2014-02-28 DIAGNOSIS — I480 Paroxysmal atrial fibrillation: Secondary | ICD-10-CM | POA: Diagnosis not present

## 2014-03-06 ENCOUNTER — Ambulatory Visit: Payer: Medicare Other | Admitting: Cardiovascular Disease

## 2014-03-20 DIAGNOSIS — Z0000 Encounter for general adult medical examination without abnormal findings: Secondary | ICD-10-CM | POA: Diagnosis not present

## 2014-03-20 DIAGNOSIS — R7301 Impaired fasting glucose: Secondary | ICD-10-CM | POA: Diagnosis not present

## 2014-03-20 DIAGNOSIS — M810 Age-related osteoporosis without current pathological fracture: Secondary | ICD-10-CM | POA: Diagnosis not present

## 2014-03-20 DIAGNOSIS — E785 Hyperlipidemia, unspecified: Secondary | ICD-10-CM | POA: Diagnosis not present

## 2014-03-20 DIAGNOSIS — Z125 Encounter for screening for malignant neoplasm of prostate: Secondary | ICD-10-CM | POA: Diagnosis not present

## 2014-03-20 DIAGNOSIS — R358 Other polyuria: Secondary | ICD-10-CM | POA: Diagnosis not present

## 2014-03-27 DIAGNOSIS — R7301 Impaired fasting glucose: Secondary | ICD-10-CM | POA: Diagnosis not present

## 2014-03-27 DIAGNOSIS — Z0000 Encounter for general adult medical examination without abnormal findings: Secondary | ICD-10-CM | POA: Diagnosis not present

## 2014-03-27 DIAGNOSIS — N390 Urinary tract infection, site not specified: Secondary | ICD-10-CM | POA: Diagnosis not present

## 2014-03-27 DIAGNOSIS — F0390 Unspecified dementia without behavioral disturbance: Secondary | ICD-10-CM | POA: Diagnosis not present

## 2014-03-27 DIAGNOSIS — I480 Paroxysmal atrial fibrillation: Secondary | ICD-10-CM | POA: Diagnosis not present

## 2014-03-27 DIAGNOSIS — N183 Chronic kidney disease, stage 3 (moderate): Secondary | ICD-10-CM | POA: Diagnosis not present

## 2014-03-27 DIAGNOSIS — E785 Hyperlipidemia, unspecified: Secondary | ICD-10-CM | POA: Diagnosis not present

## 2014-03-27 DIAGNOSIS — Z1389 Encounter for screening for other disorder: Secondary | ICD-10-CM | POA: Diagnosis not present

## 2014-03-27 DIAGNOSIS — Z7901 Long term (current) use of anticoagulants: Secondary | ICD-10-CM | POA: Diagnosis not present

## 2014-04-04 ENCOUNTER — Emergency Department (INDEPENDENT_AMBULATORY_CARE_PROVIDER_SITE_OTHER)
Admission: EM | Admit: 2014-04-04 | Discharge: 2014-04-04 | Disposition: A | Discharge status: Home / Self Care | Payer: Medicare Other | Source: Home / Self Care | Attending: Emergency Medicine | Admitting: Emergency Medicine

## 2014-04-04 ENCOUNTER — Encounter (HOSPITAL_COMMUNITY): Payer: Self-pay | Admitting: Emergency Medicine

## 2014-04-04 ENCOUNTER — Emergency Department (INDEPENDENT_AMBULATORY_CARE_PROVIDER_SITE_OTHER): Payer: Medicare Other

## 2014-04-04 ENCOUNTER — Emergency Department (HOSPITAL_COMMUNITY): Payer: Medicare Other

## 2014-04-04 DIAGNOSIS — S4992XA Unspecified injury of left shoulder and upper arm, initial encounter: Secondary | ICD-10-CM | POA: Diagnosis not present

## 2014-04-04 DIAGNOSIS — Z9181 History of falling: Secondary | ICD-10-CM | POA: Diagnosis not present

## 2014-04-04 DIAGNOSIS — Z6828 Body mass index (BMI) 28.0-28.9, adult: Secondary | ICD-10-CM | POA: Diagnosis not present

## 2014-04-04 DIAGNOSIS — M25512 Pain in left shoulder: Secondary | ICD-10-CM | POA: Diagnosis not present

## 2014-04-04 DIAGNOSIS — S42102A Fracture of unspecified part of scapula, left shoulder, initial encounter for closed fracture: Secondary | ICD-10-CM

## 2014-04-04 DIAGNOSIS — Z7901 Long term (current) use of anticoagulants: Secondary | ICD-10-CM | POA: Diagnosis not present

## 2014-04-04 DIAGNOSIS — R269 Unspecified abnormalities of gait and mobility: Secondary | ICD-10-CM | POA: Diagnosis not present

## 2014-04-04 DIAGNOSIS — F0390 Unspecified dementia without behavioral disturbance: Secondary | ICD-10-CM | POA: Diagnosis not present

## 2014-04-04 DIAGNOSIS — I480 Paroxysmal atrial fibrillation: Secondary | ICD-10-CM | POA: Diagnosis not present

## 2014-04-04 MED ORDER — ONDANSETRON HCL 4 MG/2ML IJ SOLN
4.0000 mg | Freq: Once | INTRAMUSCULAR | Status: AC
Start: 2014-04-04 — End: 2014-04-04
  Administered 2014-04-04: 4 mg via INTRAMUSCULAR

## 2014-04-04 MED ORDER — HYDROMORPHONE HCL 1 MG/ML IJ SOLN
0.5000 mg | Freq: Once | INTRAMUSCULAR | Status: AC
  Administered 2014-04-04: 0.5 mg via INTRAMUSCULAR

## 2014-04-04 MED ORDER — HYDROCODONE-ACETAMINOPHEN 5-325 MG PO TABS: 1.0000 | ORAL_TABLET | Freq: Four times a day (QID) | ORAL | Status: DC | PRN

## 2014-04-04 MED ORDER — ONDANSETRON HCL 4 MG/2ML IJ SOLN
INTRAMUSCULAR | Status: AC
Start: 2014-04-04 — End: 2014-04-04
  Filled 2014-04-04: qty 2

## 2014-04-04 MED ORDER — HYDROMORPHONE HCL 1 MG/ML IJ SOLN
INTRAMUSCULAR | Status: AC
  Filled 2014-04-04: qty 1

## 2014-04-04 NOTE — ED Notes (Signed)
Pt states that he fell backwards 3 days ago he has a history of dementia and doesn't remember how he fell. Pt went to PCP today and was told that his left shoulder was dislocated.

## 2014-04-04 NOTE — Discharge Instructions (Signed)
Scapular Fracture You have a fracture (break in bone) of your scapula. This is your shoulder blade. It is the large flat bone behind your shoulder. This is also the bone that makes up the ball and socket joint of your shoulder. Most of the time surgery is not required for injuries to this bone unless the socket of the shoulder joint is involved. DIAGNOSIS  Because of the severity of force usually required to break this bone, x-rays are often taken of other bones likely to be injured at the same time. X-rays of the hip, knee, and pelvis may be taken. Specialized x-rays (arteriograms) may be needed if there are injuries to large blood vessels associated with this injury. HOME CARE INSTRUCTIONS   Simple fractures of the scapula can be treated with a sling and swathe type of immobilization. This means the involved area is held in place by putting the arm in a sling. A wrap is made around the upper arm with the sling holding the arm next to the chest. This may be removed for bathing as instructed by your caregiver.  Apply ice to the injury for 15-20 minutes 03-04 times per day. Put the ice in a plastic bag. Place a towel between the bag of ice and your skin, splint, or immobilization device.  Do not resume use until instructed by your caregiver. Usually full rehabilitation (exercises to improve the injury site) will begin sometime after the sling and swathe are removed. Then begin use gradually as directed. Do not increase use to the point of pain. If pain develops, decrease use and continue the above measures. Slowly increase activities that do not cause discomfort until you gradually achieve normal use without pain.  Only take over-the-counter or prescription medicines for pain, discomfort, or fever as directed by your caregiver. SEEK IMMEDIATE MEDICAL CARE IF:   Your pain and swelling increase and is uncontrolled with medications.  You develop new, unexplained symptoms or an increase of the symptoms  which brought you to your caregiver.  You develop shortness or breath or cough up blood.  You are unable to move your arm or fingers. You develop warmth and swelling in your affected arm.  You develop an unexplained temperature. Document Released: 02/07/2005 Document Revised: 05/02/2011 Document Reviewed: 12/30/2005 [employer] Patient Information 2015 [place], [place]. This information is not intended to replace advice given to you by your health care provider. Make sure you discuss any questions you have with your health care provider.

## 2014-04-04 NOTE — ED Provider Notes (Signed)
CSN: [id number]     Arrival date & time 04/04/14  1655 History   First MD Initiated Contact with Patient 04/04/14 1703     Chief Complaint  Patient presents with  . Shoulder Injury   (Consider location/radiation/quality/duration/timing/severity/associated sxs/prior Treatment) HPI Comments: [name] is a very pleasant 81 yo male with left shoulder pain and probable dislocation. He carries a history of dislocation to the right shoulder in the past. He is pleasantly demented. He reports fall 3 days ago but the specifics are unclear. He woke up this am with pain and "deformities to his shoulder". He was seen by Dr. Brigitte Pulse his PCP and told to come here for an evaluation. Patient reports limited movement in the arm. He is currently on anti-coagulants for A-Fib.   Patient is a 81 y.o. male presenting with shoulder injury. The history is provided by a caregiver.  Shoulder Injury    Past Medical History  Diagnosis Date  . Atrial fibrillation   . Hypertension   . Dementia    History reviewed. No pertinent past surgical history. History reviewed. No pertinent family history. History  Substance Use Topics  . Smoking status: Never Smoker   . Smokeless tobacco: Not on file  . Alcohol Use: No    Review of Systems  All other systems reviewed and are negative.   Allergies  Sulfa antibiotics  Home Medications   Prior to Admission medications   Medication Sig Start Date End Date Taking? Authorizing Provider  atorvastatin (LIPITOR) 10 MG tablet Take 10 mg by mouth daily.   Yes Historical Provider, MD  cephALEXin (KEFLEX) 500 MG capsule Take 500 mg by mouth 2 (two) times daily.   Yes Historical Provider, MD  HYDROcodone-acetaminophen (NORCO/VICODIN) 5-325 MG per tablet Take 1 tablet by mouth every 6 (six) hours as needed for moderate pain. 04/04/14   Bjorn Pippin, PA-C  metoprolol succinate (TOPROL-XL) 50 MG 24 hr tablet Take 50 mg by mouth daily. Take with or immediately following a  meal.   Yes Historical Provider, MD  sertraline (ZOLOFT) 100 MG tablet Take 100 mg by mouth daily.   Yes Historical Provider, MD  warfarin (COUMADIN) 2.5 MG tablet Take 2.5 mg by mouth daily.   Yes Historical Provider, MD   BP 154/91 mmHg  Pulse 67  Temp(Src) 97.3 F (36.3 C) (Oral)  Resp 16  SpO2 90% Physical Exam  Constitutional: He is oriented to person, place, and time. He appears well-developed and well-nourished. No distress.  HENT:  Head: Normocephalic and atraumatic.  No trauma to head  Eyes: EOM are normal. Pupils are equal, round, and reactive to light.  Neck: Normal range of motion.  Cardiovascular: Normal rate and regular rhythm.   Pulmonary/Chest: Effort normal and breath sounds normal. No respiratory distress. He exhibits no tenderness.  Musculoskeletal:  Anterior deformity to the left shoulder. No ecchymosis is noted. Decreased active and passive ROM to abduction and IR and ER with guarding.   Neurological: He is alert and oriented to person, place, and time.  Skin: Skin is warm and dry.  Psychiatric: His behavior is normal.  Nursing note and vitals reviewed.   ED Course  Procedures (including critical care time) Labs Review Labs Reviewed - No data to display  Imaging Review Dg Shoulder Left  04/04/2014   CLINICAL DATA:  81 year old male who fell 2 days ago. Pain. Possible dislocation. Initial encounter.  EXAM: LEFT SHOULDER - 2+ VIEW  COMPARISON:  None.  FINDINGS: Suboptimal scapular Y-view (image  2), but no strong evidence of glenohumeral joint dislocation.  Proximal left humerus appears intact.  Left clavicle appears intact.  However, there is cortical irregularity inferior to the glenoid suspicious for a minimally displaced left scapula fracture. Also there is discontinuity of the left posterior fifth rib. Other visible ribs appear grossly intact.  IMPRESSION: 1. No strong evidence of left glenohumeral joint dislocation, however the appearance is suspicious for a  minimally displaced left scapula fracture (image #1 arrow). 2. Discontinuity of the left posterior fifth rib, appears to be chronic, less likely due to acute rib fracture.   Electronically Signed   By: Genevie Ann M.D.   On: 04/04/2014 18:31     MDM   1. Scapular fracture, left, closed, initial encounter   2. Shoulder pain, acute, left    Discussed with Dr. Hart Robinsons, [place] Char. No neuro deficit, Lung exam normal. Treat with sling, pain medications and rest. Daughter will be with patient to monitor. He will follow up with Orthopedics next week.     Bjorn Pippin, PA-C 04/04/14 1919

## 2014-04-08 ENCOUNTER — Encounter (HOSPITAL_COMMUNITY): Payer: Self-pay | Admitting: Emergency Medicine

## 2014-04-08 ENCOUNTER — Emergency Department (HOSPITAL_COMMUNITY): Payer: Medicare Other

## 2014-04-08 ENCOUNTER — Emergency Department (HOSPITAL_COMMUNITY)
Admission: EM | Admit: 2014-04-08 | Discharge: 2014-04-08 | Disposition: A | Discharge status: Home / Self Care | Payer: Medicare Other | Attending: Emergency Medicine | Admitting: Emergency Medicine

## 2014-04-08 DIAGNOSIS — Z85118 Personal history of other malignant neoplasm of bronchus and lung: Secondary | ICD-10-CM | POA: Insufficient documentation

## 2014-04-08 DIAGNOSIS — Z792 Long term (current) use of antibiotics: Secondary | ICD-10-CM | POA: Insufficient documentation

## 2014-04-08 DIAGNOSIS — Z87891 Personal history of nicotine dependence: Secondary | ICD-10-CM | POA: Insufficient documentation

## 2014-04-08 DIAGNOSIS — S0990XA Unspecified injury of head, initial encounter: Secondary | ICD-10-CM | POA: Diagnosis not present

## 2014-04-08 DIAGNOSIS — N390 Urinary tract infection, site not specified: Secondary | ICD-10-CM | POA: Insufficient documentation

## 2014-04-08 DIAGNOSIS — Z7901 Long term (current) use of anticoagulants: Secondary | ICD-10-CM | POA: Diagnosis not present

## 2014-04-08 DIAGNOSIS — E785 Hyperlipidemia, unspecified: Secondary | ICD-10-CM | POA: Diagnosis not present

## 2014-04-08 DIAGNOSIS — I10 Essential (primary) hypertension: Secondary | ICD-10-CM | POA: Insufficient documentation

## 2014-04-08 DIAGNOSIS — Z7982 Long term (current) use of aspirin: Secondary | ICD-10-CM | POA: Diagnosis not present

## 2014-04-08 DIAGNOSIS — A499 Bacterial infection, unspecified: Secondary | ICD-10-CM

## 2014-04-08 DIAGNOSIS — R55 Syncope and collapse: Secondary | ICD-10-CM | POA: Diagnosis not present

## 2014-04-08 DIAGNOSIS — Z8551 Personal history of malignant neoplasm of bladder: Secondary | ICD-10-CM | POA: Diagnosis not present

## 2014-04-08 DIAGNOSIS — R4701 Aphasia: Secondary | ICD-10-CM | POA: Diagnosis not present

## 2014-04-08 DIAGNOSIS — F0390 Unspecified dementia without behavioral disturbance: Secondary | ICD-10-CM | POA: Diagnosis not present

## 2014-04-08 DIAGNOSIS — Z79899 Other long term (current) drug therapy: Secondary | ICD-10-CM | POA: Diagnosis not present

## 2014-04-08 DIAGNOSIS — B9689 Other specified bacterial agents as the cause of diseases classified elsewhere: Secondary | ICD-10-CM | POA: Diagnosis not present

## 2014-04-08 DIAGNOSIS — R531 Weakness: Secondary | ICD-10-CM | POA: Insufficient documentation

## 2014-04-08 LAB — DIFFERENTIAL
BASOS ABS: 0 10*3/uL (ref 0.0–0.1)
Basophils Relative: 0 % (ref 0–1)
EOS ABS: 0.1 10*3/uL (ref 0.0–0.7)
Eosinophils Relative: 1 % (ref 0–5)
Lymphocytes Relative: 7 % — ABNORMAL LOW (ref 12–46)
Lymphs Abs: 0.7 10*3/uL (ref 0.7–4.0)
MONO ABS: 1.3 10*3/uL — AB (ref 0.1–1.0)
MONOS PCT: 12 % (ref 3–12)
Neutro Abs: 8.8 10*3/uL — ABNORMAL HIGH (ref 1.7–7.7)
Neutrophils Relative %: 80 % — ABNORMAL HIGH (ref 43–77)

## 2014-04-08 LAB — CBC
HCT: 40.3 % (ref 39.0–52.0)
Hemoglobin: 13.1 g/dL (ref 13.0–17.0)
MCH: 29 pg (ref 26.0–34.0)
MCHC: 32.5 g/dL (ref 30.0–36.0)
MCV: 89.4 fL (ref 78.0–100.0)
PLATELETS: 218 10*3/uL (ref 150–400)
RBC: 4.51 MIL/uL (ref 4.22–5.81)
RDW: 14.6 % (ref 11.5–15.5)
WBC: 11 10*3/uL — ABNORMAL HIGH (ref 4.0–10.5)

## 2014-04-08 LAB — URINALYSIS, ROUTINE W REFLEX MICROSCOPIC
Bilirubin Urine: NEGATIVE
Glucose, UA: NEGATIVE mg/dL
Ketones, ur: NEGATIVE mg/dL
NITRITE: NEGATIVE
PH: 6 (ref 5.0–8.0)
Protein, ur: 30 mg/dL — AB
SPECIFIC GRAVITY, URINE: 1.015 (ref 1.005–1.030)
UROBILINOGEN UA: 0.2 mg/dL (ref 0.0–1.0)

## 2014-04-08 LAB — COMPREHENSIVE METABOLIC PANEL
ALK PHOS: 51 U/L (ref 39–117)
ALT: 15 U/L (ref 0–53)
AST: 23 U/L (ref 0–37)
Albumin: 3.7 g/dL (ref 3.5–5.2)
Anion gap: 10 (ref 5–15)
BUN: 23 mg/dL (ref 6–23)
CHLORIDE: 104 mmol/L (ref 96–112)
CO2: 25 mmol/L (ref 19–32)
Calcium: 9.6 mg/dL (ref 8.4–10.5)
Creatinine, Ser: 1.44 mg/dL — ABNORMAL HIGH (ref 0.50–1.35)
GFR calc Af Amer: 51 mL/min — ABNORMAL LOW (ref >90)
GFR calc non Af Amer: 44 mL/min — ABNORMAL LOW (ref >90)
GLUCOSE: 117 mg/dL — AB (ref 70–99)
POTASSIUM: 4.5 mmol/L (ref 3.5–5.1)
SODIUM: 139 mmol/L (ref 135–145)
Total Bilirubin: 1.8 mg/dL — ABNORMAL HIGH (ref 0.3–1.2)
Total Protein: 6.8 g/dL (ref 6.0–8.3)

## 2014-04-08 LAB — URINE MICROSCOPIC-ADD ON

## 2014-04-08 LAB — CBG MONITORING, ED: GLUCOSE-CAPILLARY: 101 mg/dL — AB (ref 70–99)

## 2014-04-08 LAB — PROTIME-INR
INR: 3.47 — AB (ref 0.00–1.49)
Prothrombin Time: 35.2 seconds — ABNORMAL HIGH (ref 11.6–15.2)

## 2014-04-08 LAB — I-STAT TROPONIN, ED: TROPONIN I, POC: 0.01 ng/mL (ref 0.00–0.08)

## 2014-04-08 LAB — APTT: aPTT: 70 seconds — ABNORMAL HIGH (ref 24–37)

## 2014-04-08 MED ORDER — CEFTRIAXONE SODIUM 1 G IJ SOLR: 1.0000 g | Freq: Once | INTRAMUSCULAR | Status: DC

## 2014-04-08 MED ORDER — CEFTRIAXONE SODIUM 1 G IJ SOLR
1.0000 g | Freq: Once | INTRAMUSCULAR | Status: AC
  Administered 2014-04-08: 1 g via INTRAMUSCULAR
  Filled 2014-04-08: qty 10

## 2014-04-08 MED ORDER — CEPHALEXIN 500 MG PO CAPS: 500.0000 mg | ORAL_CAPSULE | Freq: Three times a day (TID) | ORAL | Status: DC

## 2014-04-08 NOTE — ED Notes (Signed)
Pt here with family with hx of frequent falls recently; pt noted to have slurred speech and difficulty walking with weakness to right leg; LSN 2 days ago; pt with hx of shoulder and scapula injury with fall from Friday

## 2014-04-08 NOTE — Discharge Instructions (Signed)
Keflex as prescribed.  Follow-up with Dr. Brigitte Pulse tomorrow morning per Dr. Baldwin Crown recommendations.   Urinary Tract Infection Urinary tract infections (UTIs) can develop anywhere along your urinary tract. Your urinary tract is your body's drainage system for removing wastes and extra water. Your urinary tract includes two kidneys, two ureters, a bladder, and a urethra. Your kidneys are a pair of bean-shaped organs. Each kidney is about the size of your fist. They are located below your ribs, one on each side of your spine. CAUSES Infections are caused by microbes, which are microscopic organisms, including fungi, viruses, and bacteria. These organisms are so small that they can only be seen through a microscope. Bacteria are the microbes that most commonly cause UTIs. SYMPTOMS  Symptoms of UTIs may vary by age and gender of the patient and by the location of the infection. Symptoms in young women typically include a frequent and intense urge to urinate and a painful, burning feeling in the bladder or urethra during urination. Older women and men are more likely to be tired, shaky, and weak and have muscle aches and abdominal pain. A fever may mean the infection is in your kidneys. Other symptoms of a kidney infection include pain in your back or sides below the ribs, nausea, and vomiting. DIAGNOSIS To diagnose a UTI, your caregiver will ask you about your symptoms. Your caregiver also will ask to provide a urine sample. The urine sample will be tested for bacteria and white blood cells. White blood cells are made by your body to help fight infection. TREATMENT  Typically, UTIs can be treated with medication. Because most UTIs are caused by a bacterial infection, they usually can be treated with the use of antibiotics. The choice of antibiotic and length of treatment depend on your symptoms and the type of bacteria causing your infection. HOME CARE INSTRUCTIONS  If you were prescribed antibiotics, take  them exactly as your caregiver instructs you. Finish the medication even if you feel better after you have only taken some of the medication.  Drink enough water and fluids to keep your urine clear or pale yellow.  Avoid caffeine, tea, and carbonated beverages. They tend to irritate your bladder.  Empty your bladder often. Avoid holding urine for long periods of time.  Empty your bladder before and after sexual intercourse.  After a bowel movement, women should cleanse from front to back. Use each tissue only once. SEEK MEDICAL CARE IF:   You have back pain.  You develop a fever.  Your symptoms do not begin to resolve within 3 days. SEEK IMMEDIATE MEDICAL CARE IF:   You have severe back pain or lower abdominal pain.  You develop chills.  You have nausea or vomiting.  You have continued burning or discomfort with urination. MAKE SURE YOU:   Understand these instructions.  Will watch your condition.  Will get help right away if you are not doing well or get worse. Document Released: 11/17/2004 Document Revised: 08/09/2011 Document Reviewed: 03/18/2011 [facility] Patient Information 2015 [place], [place]. This information is not intended to replace advice given to you by your health care provider. Make sure you discuss any questions you have with your health care provider.

## 2014-04-08 NOTE — ED Provider Notes (Signed)
CSN: [id number]     Arrival date & time 04/08/14  1456 History   First MD Initiated Contact with Patient 04/08/14 1737     Chief Complaint  Patient presents with  . Fall  . Aphasia     (Consider location/radiation/quality/duration/timing/severity/associated sxs/prior Treatment) HPI Comments: Patient is an 81 year old male with past medical history of atrial fibrillation, hypertension, dementia, lung cancer. He is brought for evaluation of weakness in his right arm and leg that started sometime in the past 2 days. He has apparently been falling more recently. He injured his shoulder and was found to have a scapula fracture last week, and was started on Vicodin for his discomfort. The daughter and granddaughter also state he behaves in a similar fashion when he has a urinary tract infection. He has a history of atrial fibrillation and currently takes Coumadin  Patient is a 82 y.o. male presenting with weakness. The history is provided by the patient.  Weakness This is a new problem. The current episode started 2 days ago. The problem occurs constantly. The problem has not changed since onset.Pertinent negatives include no chest pain, no abdominal pain and no headaches. Nothing aggravates the symptoms. Nothing relieves the symptoms. He has tried nothing for the symptoms. The treatment provided no relief.    Past Medical History  Diagnosis Date  . Atrial fibrillation, permanent   . Hyperlipidemia   . Hypertension   . UTI (urinary tract infection)   . Dementia   . Hyperlipidemia   . Bladder cancer     hx of surgery and chemo  . Lung cancer     Status post surgery resection   Past Surgical History  Procedure Laterality Date  . Hernia repair  2006  . Cataract extraction, bilateral  1998, 2000  . Cystectomy  2002  . Lung removal, partial     Family History  Problem Relation Age of Onset  . Cancer Maternal Aunt     unknown  . Cancer Maternal Grandmother     unknown   History   Substance Use Topics  . Smoking status: Former Smoker    Quit date: 10/10/1991  . Smokeless tobacco: Never Used  . Alcohol Use: No    Review of Systems  Cardiovascular: Negative for chest pain.  Gastrointestinal: Negative for abdominal pain.  Neurological: Positive for weakness. Negative for headaches.  All other systems reviewed and are negative.     Allergies  Sulfonamide derivatives  Home Medications   Prior to Admission medications   Medication Sig Start Date End Date Taking? Authorizing Provider  ascorbic acid (VITAMIN C) 1000 MG tablet Take 1,000 mg by mouth daily.   Yes Historical Provider, MD  aspirin EC 81 MG tablet Take 81 mg by mouth daily.   Yes Historical Provider, MD  atorvastatin (LIPITOR) 10 MG tablet Take 10 mg by mouth daily.   Yes Historical Provider, MD  calcium carbonate (OS-CAL - DOSED IN MG OF ELEMENTAL CALCIUM) 1250 MG tablet Take 1 tablet by mouth daily.   Yes Historical Provider, MD  cephALEXin (KEFLEX) 500 MG capsule Take 500 mg by mouth daily. 08/26/13  Yes Historical Provider, MD  cholecalciferol (VITAMIN D) 1000 UNITS tablet Take 1,000 Units by mouth daily.   Yes Historical Provider, MD  Cyanocobalamin (VITAMIN B 12 PO) Take 500 mg by mouth 2 (two) times daily.   Yes Historical Provider, MD  esomeprazole (NEXIUM) 40 MG capsule Take 40 mg by mouth daily as needed. For acid reflux   Yes  Historical Provider, MD  HYDROcodone-acetaminophen (NORCO/VICODIN) 5-325 MG per tablet Take 1 tablet by mouth every 6 (six) hours as needed for moderate pain.   Yes Historical Provider, MD  hydrocortisone valerate cream (WESTCORT) 0.2 % Apply 1 application topically 2 (two) times daily.   Yes Historical Provider, MD  metoprolol succinate (TOPROL-XL) 50 MG 24 hr tablet Take 50 mg by mouth daily. Take with or immediately following a meal.   Yes Historical Provider, MD  sertraline (ZOLOFT) 100 MG tablet Take 100 mg by mouth daily.   Yes Historical Provider, MD  vitamin E 100  UNIT capsule Take 100 Units by mouth daily.   Yes Historical Provider, MD  warfarin (COUMADIN) 5 MG tablet Take 2.5-5 mg by mouth daily at 6 PM. Take  2.5 mg on Mon,Wed,Fri,Sat & SUN.  Take 5 mg on Tuesday and Thursday   Yes Historical Provider, MD  HYDROcodone-acetaminophen (NORCO/VICODIN) 5-325 MG per tablet Take 1 tablet by mouth every 6 (six) hours as needed for pain. Patient not taking: Reported on 04/08/2014 12/31/11   Orlie Dakin, MD   BP 91/64 mmHg  Pulse 69  Temp(Src) 98.2 F (36.8 C) (Oral)  Resp 14  SpO2 93% Physical Exam  Constitutional: He is oriented to person, place, and time. He appears well-developed and well-nourished. No distress.  HENT:  Head: Normocephalic and atraumatic.  Mouth/Throat: Oropharynx is clear and moist.  Neck: Normal range of motion. Neck supple.  Cardiovascular: Normal rate, regular rhythm and normal heart sounds.   No murmur heard. Pulmonary/Chest: Effort normal and breath sounds normal. No respiratory distress. He has no wheezes.  Abdominal: Soft. Bowel sounds are normal. He exhibits no distension. There is no tenderness.  Musculoskeletal: Normal range of motion. He exhibits no edema.  The left shoulder is noted to have swelling and ecchymosis over the anterior and lateral aspect. There is no obvious deformity and he appears to have good range of motion. Distal PMS is intact.  Neurological: He is alert and oriented to person, place, and time. No cranial nerve deficit. He exhibits normal muscle tone. Coordination normal.  Skin: Skin is warm and dry. He is not diaphoretic.  Nursing note and vitals reviewed.   ED Course  Procedures (including critical care time) Labs Review Labs Reviewed  PROTIME-INR - Abnormal; Notable for the following:    Prothrombin Time 35.2 (*)    INR 3.47 (*)    All other components within normal limits  APTT - Abnormal; Notable for the following:    aPTT 70 (*)    All other components within normal limits  CBC -  Abnormal; Notable for the following:    WBC 11.0 (*)    All other components within normal limits  DIFFERENTIAL - Abnormal; Notable for the following:    Neutrophils Relative % 80 (*)    Neutro Abs 8.8 (*)    Lymphocytes Relative 7 (*)    Monocytes Absolute 1.3 (*)    All other components within normal limits  COMPREHENSIVE METABOLIC PANEL - Abnormal; Notable for the following:    Glucose, Bld 117 (*)    Creatinine, Ser 1.44 (*)    Total Bilirubin 1.8 (*)    GFR calc non Af Amer 44 (*)    GFR calc Af Amer 51 (*)    All other components within normal limits  URINALYSIS, ROUTINE W REFLEX MICROSCOPIC  I-STAT TROPOININ, ED  CBG MONITORING, ED    Imaging Review Ct Head (brain) Wo Contrast  04/08/2014  CLINICAL DATA:  Multiple falls over last several days. Aphasia. Initial encounter.  EXAM: CT HEAD WITHOUT CONTRAST  TECHNIQUE: Contiguous axial images were obtained from the base of the skull through the vertex without intravenous contrast.  COMPARISON:  04/30/2012  FINDINGS: Stable advanced small vessel ischemic changes in the periventricular white matter. Stable diffuse cortical atrophy. The brain demonstrates no evidence of hemorrhage, acute infarction, edema, mass effect, extra-axial fluid collection, hydrocephalus or mass lesion. No evidence of skull fracture or bony lesion.  IMPRESSION: Stable atrophy and small vessel disease.  No acute findings.   Electronically Signed   By: Aletta Edouard M.D.   On: 04/08/2014 17:11     EKG Interpretation   Date/Time:  Tuesday April 08 2014 15:42:24 EST Ventricular Rate:  88 PR Interval:    QRS Duration: 88 QT Interval:  370 QTC Calculation: 447 R Axis:   -32 Text Interpretation:  Atrial fibrillation Left axis deviation Moderate  voltage criteria for LVH, may be normal variant Inferior infarct , age  undetermined Anterolateral infarct , age undetermined Abnormal ECG  Confirmed by DELOS  MD, Jeffrey Voth ([id number]) on 04/08/2014 9:31:09 PM       MDM   Final diagnoses:  None    Patient is an 81 year old male with past medical history of atrial fibrillation, hypertension, dementia, and lung cancer. He is brought by family or evaluation of weakness in his legs and fall that occurred on Friday. He had x-rays obtained which revealed no obvious fracture and was referred to orthopedics. He was to have his appointment with orthopedics today, however began to have increased difficulty with ambulation. He is brought here for evaluation of this.  Physical examination reveals no focal deficits. His strength appears to be equal, normal, and symmetrical. Workup performed in the ER reveals no significant abnormalities with the exception of a mildly elevated white count, essentially unremarkable electrolytes. Due to the reported weakness in his leg, and MRI of the brain was obtained which revealed no evidence for acute CVA. He has a history of neurogenic bladder and performs self catheterizations at home. His urinalysis is reflective of a urinary tract infection.  When I discussed the results of the workup with the daughter, she informed me that he is typically been admitted in the past with similar presentations. His primary doctor is Dr. Brigitte Pulse from [facility]. Dr. Forde Dandy was on call this evening and I discussed the case with him. He did not feel as though patient met requirement for admission. His recommendation was for an IV dose of antibiotics and follow-up in the clinic in the morning. The daughter seems somewhat uncomfortable with this and I offered to speak with Dr. Forde Dandy again, however the daughter agreed to go with the before mentioned plan.   Veryl Speak, MD 04/08/14 2135

## 2014-04-08 NOTE — ED Notes (Signed)
CBG is 101. Notified nurse Carmelina Paddock.

## 2014-04-08 NOTE — ED Notes (Signed)
Pt family requested PT INR to be drawn. This nurse advised family LABS have already been drawn. Advised patient and family INR result.

## 2014-04-09 DIAGNOSIS — Z6827 Body mass index (BMI) 27.0-27.9, adult: Secondary | ICD-10-CM | POA: Diagnosis not present

## 2014-04-09 DIAGNOSIS — R531 Weakness: Secondary | ICD-10-CM | POA: Diagnosis not present

## 2014-04-09 DIAGNOSIS — I480 Paroxysmal atrial fibrillation: Secondary | ICD-10-CM | POA: Diagnosis not present

## 2014-04-09 DIAGNOSIS — N390 Urinary tract infection, site not specified: Secondary | ICD-10-CM | POA: Diagnosis not present

## 2014-04-09 DIAGNOSIS — Z7901 Long term (current) use of anticoagulants: Secondary | ICD-10-CM | POA: Diagnosis not present

## 2014-04-09 DIAGNOSIS — S42192D Fracture of other part of scapula, left shoulder, subsequent encounter for fracture with routine healing: Secondary | ICD-10-CM | POA: Diagnosis not present

## 2014-04-30 DIAGNOSIS — N390 Urinary tract infection, site not specified: Secondary | ICD-10-CM | POA: Diagnosis not present

## 2014-04-30 DIAGNOSIS — N200 Calculus of kidney: Secondary | ICD-10-CM | POA: Diagnosis not present

## 2014-04-30 DIAGNOSIS — N261 Atrophy of kidney (terminal): Secondary | ICD-10-CM | POA: Diagnosis not present

## 2014-04-30 DIAGNOSIS — Z8551 Personal history of malignant neoplasm of bladder: Secondary | ICD-10-CM | POA: Diagnosis not present

## 2014-04-30 DIAGNOSIS — Q631 Lobulated, fused and horseshoe kidney: Secondary | ICD-10-CM | POA: Diagnosis not present

## 2014-05-21 ENCOUNTER — Emergency Department (HOSPITAL_COMMUNITY): Payer: Medicare Other

## 2014-05-21 ENCOUNTER — Encounter (HOSPITAL_COMMUNITY): Payer: Self-pay | Admitting: Emergency Medicine

## 2014-05-21 ENCOUNTER — Inpatient Hospital Stay (HOSPITAL_COMMUNITY)
Admission: EM | Admit: 2014-05-21 | Discharge: 2014-05-24 | DRG: 872 | Disposition: A | Discharge status: Home / Self Care | Payer: Medicare Other | Attending: Internal Medicine | Admitting: Internal Medicine

## 2014-05-21 DIAGNOSIS — R300 Dysuria: Secondary | ICD-10-CM | POA: Diagnosis not present

## 2014-05-21 DIAGNOSIS — E876 Hypokalemia: Secondary | ICD-10-CM | POA: Diagnosis present

## 2014-05-21 DIAGNOSIS — R404 Transient alteration of awareness: Secondary | ICD-10-CM | POA: Diagnosis not present

## 2014-05-21 DIAGNOSIS — Z9841 Cataract extraction status, right eye: Secondary | ICD-10-CM | POA: Diagnosis not present

## 2014-05-21 DIAGNOSIS — I482 Chronic atrial fibrillation: Secondary | ICD-10-CM | POA: Diagnosis present

## 2014-05-21 DIAGNOSIS — A419 Sepsis, unspecified organism: Secondary | ICD-10-CM | POA: Diagnosis not present

## 2014-05-21 DIAGNOSIS — B9689 Other specified bacterial agents as the cause of diseases classified elsewhere: Secondary | ICD-10-CM | POA: Diagnosis not present

## 2014-05-21 DIAGNOSIS — Z9842 Cataract extraction status, left eye: Secondary | ICD-10-CM

## 2014-05-21 DIAGNOSIS — I10 Essential (primary) hypertension: Secondary | ICD-10-CM | POA: Diagnosis present

## 2014-05-21 DIAGNOSIS — F0390 Unspecified dementia without behavioral disturbance: Secondary | ICD-10-CM | POA: Diagnosis present

## 2014-05-21 DIAGNOSIS — B952 Enterococcus as the cause of diseases classified elsewhere: Secondary | ICD-10-CM | POA: Diagnosis present

## 2014-05-21 DIAGNOSIS — Z8551 Personal history of malignant neoplasm of bladder: Secondary | ICD-10-CM

## 2014-05-21 DIAGNOSIS — B965 Pseudomonas (aeruginosa) (mallei) (pseudomallei) as the cause of diseases classified elsewhere: Secondary | ICD-10-CM | POA: Diagnosis present

## 2014-05-21 DIAGNOSIS — Z9350 Unspecified cystostomy status: Secondary | ICD-10-CM | POA: Diagnosis not present

## 2014-05-21 DIAGNOSIS — R531 Weakness: Secondary | ICD-10-CM | POA: Diagnosis not present

## 2014-05-21 DIAGNOSIS — E785 Hyperlipidemia, unspecified: Secondary | ICD-10-CM | POA: Diagnosis present

## 2014-05-21 DIAGNOSIS — Z85118 Personal history of other malignant neoplasm of bronchus and lung: Secondary | ICD-10-CM

## 2014-05-21 DIAGNOSIS — R350 Frequency of micturition: Secondary | ICD-10-CM | POA: Diagnosis not present

## 2014-05-21 DIAGNOSIS — N390 Urinary tract infection, site not specified: Secondary | ICD-10-CM | POA: Diagnosis not present

## 2014-05-21 DIAGNOSIS — N3000 Acute cystitis without hematuria: Secondary | ICD-10-CM | POA: Diagnosis not present

## 2014-05-21 DIAGNOSIS — T8351XA Infection and inflammatory reaction due to indwelling urinary catheter, initial encounter: Secondary | ICD-10-CM | POA: Diagnosis not present

## 2014-05-21 DIAGNOSIS — Z87891 Personal history of nicotine dependence: Secondary | ICD-10-CM

## 2014-05-21 DIAGNOSIS — R4182 Altered mental status, unspecified: Secondary | ICD-10-CM | POA: Diagnosis not present

## 2014-05-21 DIAGNOSIS — R410 Disorientation, unspecified: Secondary | ICD-10-CM

## 2014-05-21 LAB — CBC WITH DIFFERENTIAL/PLATELET
BASOS PCT: 0 % (ref 0–1)
Basophils Absolute: 0 10*3/uL (ref 0.0–0.1)
Eosinophils Absolute: 0 10*3/uL (ref 0.0–0.7)
Eosinophils Relative: 0 % (ref 0–5)
HCT: 47.5 % (ref 39.0–52.0)
HEMOGLOBIN: 15.6 g/dL (ref 13.0–17.0)
LYMPHS ABS: 0.6 10*3/uL — AB (ref 0.7–4.0)
Lymphocytes Relative: 3 % — ABNORMAL LOW (ref 12–46)
MCH: 29.5 pg (ref 26.0–34.0)
MCHC: 32.8 g/dL (ref 30.0–36.0)
MCV: 90 fL (ref 78.0–100.0)
MONOS PCT: 10 % (ref 3–12)
Monocytes Absolute: 2.1 10*3/uL — ABNORMAL HIGH (ref 0.1–1.0)
NEUTROS ABS: 19 10*3/uL — AB (ref 1.7–7.7)
Neutrophils Relative %: 87 % — ABNORMAL HIGH (ref 43–77)
PLATELETS: 187 10*3/uL (ref 150–400)
RBC: 5.28 MIL/uL (ref 4.22–5.81)
RDW: 14.3 % (ref 11.5–15.5)
WBC: 21.8 10*3/uL — ABNORMAL HIGH (ref 4.0–10.5)

## 2014-05-21 LAB — BASIC METABOLIC PANEL
ANION GAP: 13 (ref 5–15)
BUN: 19 mg/dL (ref 6–23)
CO2: 26 mmol/L (ref 19–32)
Calcium: 9.7 mg/dL (ref 8.4–10.5)
Chloride: 103 mmol/L (ref 96–112)
Creatinine, Ser: 1.16 mg/dL (ref 0.50–1.35)
GFR calc Af Amer: 66 mL/min — ABNORMAL LOW (ref >90)
GFR calc non Af Amer: 57 mL/min — ABNORMAL LOW (ref >90)
Glucose, Bld: 126 mg/dL — ABNORMAL HIGH (ref 70–99)
POTASSIUM: 3.9 mmol/L (ref 3.5–5.1)
SODIUM: 142 mmol/L (ref 135–145)

## 2014-05-21 LAB — URINALYSIS, ROUTINE W REFLEX MICROSCOPIC
BILIRUBIN URINE: NEGATIVE
Glucose, UA: NEGATIVE mg/dL
Ketones, ur: NEGATIVE mg/dL
NITRITE: NEGATIVE
PROTEIN: 30 mg/dL — AB
Specific Gravity, Urine: 1.012 (ref 1.005–1.030)
Urobilinogen, UA: 0.2 mg/dL (ref 0.0–1.0)
pH: 6 (ref 5.0–8.0)

## 2014-05-21 LAB — URINE MICROSCOPIC-ADD ON

## 2014-05-21 LAB — LACTIC ACID, PLASMA
LACTIC ACID, VENOUS: 1.6 mmol/L (ref 0.5–2.0)
Lactic Acid, Venous: 1.7 mmol/L (ref 0.5–2.0)

## 2014-05-21 LAB — APTT: APTT: 30 s (ref 24–37)

## 2014-05-21 LAB — PROCALCITONIN: PROCALCITONIN: 0.1 ng/mL

## 2014-05-21 LAB — I-STAT TROPONIN, ED: TROPONIN I, POC: 0.01 ng/mL (ref 0.00–0.08)

## 2014-05-21 LAB — PROTIME-INR
INR: 1.12 (ref 0.00–1.49)
PROTHROMBIN TIME: 14.6 s (ref 11.6–15.2)

## 2014-05-21 MED ORDER — HYDROCORTISONE VALERATE 0.2 % EX CREA: 1.0000 "application " | TOPICAL_CREAM | Freq: Two times a day (BID) | CUTANEOUS | Status: DC | PRN

## 2014-05-21 MED ORDER — CEFTRIAXONE SODIUM IN DEXTROSE 20 MG/ML IV SOLN
1.0000 g | INTRAVENOUS | Status: DC
  Administered 2014-05-22 – 2014-05-23 (×2): 1 g via INTRAVENOUS
  Filled 2014-05-21 (×3): qty 50

## 2014-05-21 MED ORDER — ATORVASTATIN CALCIUM 10 MG PO TABS
10.0000 mg | ORAL_TABLET | Freq: Every day | ORAL | Status: DC
  Administered 2014-05-21 – 2014-05-24 (×4): 10 mg via ORAL
  Filled 2014-05-21 (×4): qty 1

## 2014-05-21 MED ORDER — VITAMIN C 500 MG PO TABS
1000.0000 mg | ORAL_TABLET | Freq: Every day | ORAL | Status: DC
  Administered 2014-05-21 – 2014-05-24 (×4): 1000 mg via ORAL
  Filled 2014-05-21 (×4): qty 2

## 2014-05-21 MED ORDER — PANTOPRAZOLE SODIUM 40 MG PO TBEC
40.0000 mg | DELAYED_RELEASE_TABLET | Freq: Every day | ORAL | Status: DC
  Administered 2014-05-21 – 2014-05-24 (×4): 40 mg via ORAL
  Filled 2014-05-21 (×4): qty 1

## 2014-05-21 MED ORDER — SERTRALINE HCL 100 MG PO TABS
100.0000 mg | ORAL_TABLET | Freq: Every day | ORAL | Status: DC
  Administered 2014-05-21 – 2014-05-24 (×4): 100 mg via ORAL
  Filled 2014-05-21 (×4): qty 1

## 2014-05-21 MED ORDER — ONDANSETRON HCL 4 MG/2ML IJ SOLN: 4.0000 mg | Freq: Four times a day (QID) | INTRAMUSCULAR | Status: DC | PRN

## 2014-05-21 MED ORDER — HYDROCODONE-ACETAMINOPHEN 5-325 MG PO TABS: 1.0000 | ORAL_TABLET | ORAL | Status: DC | PRN

## 2014-05-21 MED ORDER — SODIUM CHLORIDE 0.9 % IV SOLN
INTRAVENOUS | Status: DC
  Administered 2014-05-21: 75 mL/h via INTRAVENOUS

## 2014-05-21 MED ORDER — ONDANSETRON HCL 4 MG PO TABS: 4.0000 mg | ORAL_TABLET | Freq: Four times a day (QID) | ORAL | Status: DC | PRN

## 2014-05-21 MED ORDER — METOPROLOL SUCCINATE ER 50 MG PO TB24
50.0000 mg | ORAL_TABLET | Freq: Every day | ORAL | Status: DC
  Administered 2014-05-21 – 2014-05-24 (×4): 50 mg via ORAL
  Filled 2014-05-21 (×4): qty 1

## 2014-05-21 MED ORDER — DEXTROSE 5 % IV SOLN
1.0000 g | Freq: Once | INTRAVENOUS | Status: AC
  Administered 2014-05-21: 1 g via INTRAVENOUS
  Filled 2014-05-21: qty 10

## 2014-05-21 MED ORDER — VITAMIN D3 25 MCG (1000 UNIT) PO TABS
1000.0000 [IU] | ORAL_TABLET | Freq: Every day | ORAL | Status: DC
  Administered 2014-05-21 – 2014-05-24 (×4): 1000 [IU] via ORAL
  Filled 2014-05-21 (×4): qty 1

## 2014-05-21 MED ORDER — ENOXAPARIN SODIUM 40 MG/0.4ML ~~LOC~~ SOLN
40.0000 mg | SUBCUTANEOUS | Status: DC
  Administered 2014-05-21 – 2014-05-23 (×3): 40 mg via SUBCUTANEOUS
  Filled 2014-05-21 (×4): qty 0.4

## 2014-05-21 MED ORDER — HYDROCORTISONE 0.5 % EX CREA: TOPICAL_CREAM | Freq: Two times a day (BID) | CUTANEOUS | Status: DC | PRN

## 2014-05-21 NOTE — ED Notes (Signed)
Upon arrival pt had on penile clamp and skin breakdown noted over groin area.

## 2014-05-21 NOTE — Progress Notes (Signed)
EDCM spoke to patient and his family at bedside.  Patient lives with his grand daughter.  Patient does not have home health services currently but has had home health after coming home from [place].  Patient has a walker, wheelchair, and shower chair at home.  Patient's daughter interested in bedside commode for patient.  Patient confirms his pcp is Dr. Brigitte Pulse.  Patient reports he is able to perform his own ADL's without difficulty.  EDCM provided patient's grand daughter with list of home health agencies in [facility], explained services.  EDCM also provided list of private duty nursing agencies and explained that it may be an out of pocket expense for the patient.  Patient's grand daughter reports patient has long term care insurance that may pay for private duty services.  Patient and patient's family thankful for resources.  No further EDCM needs at this time.

## 2014-05-21 NOTE — ED Notes (Signed)
Bed: WA01 Expected date:  Expected time:  Means of arrival:  Comments: EMS-dysuria

## 2014-05-21 NOTE — ED Notes (Signed)
Per EMS. Pt from home, lives with family. Hx of dementia, oriented per norm according to family. Pt has hx of recurrent UTIs. Family noticed pt began to have dysuria today and more sleepy than normal. Sent here for evaluation.

## 2014-05-21 NOTE — ED Notes (Signed)
Pt states he fell last night. Pt has bruise on bridge of nose and R elbow.

## 2014-05-21 NOTE — H&P (Signed)
[facility] History and Physical  [name] DXA:[id number] DOB: [birth year] DOA: 05/21/2014  Referring physician: ED physician PCP: [name], MD   Chief Complaint: dysuria and weakness   HPI:  Pt is 81 yo male with known HTN, dementia, able to perform all ADL's at home, loves with family, presented with main concern of 1-2 days duration of worsening dysuria and more lethargy, poor oral intake. Family explains that pt is rather independent at baseline but he did appear more bed bound over the past 24 hours. He has history of recurrent UTI's and family was concerned for UTI being the cause of the symptoms. Pt denies fevers or chills, has noted urinary urgency and frequency and malodorous urine. Pt denies chest pain or shortness of breath, no abd concerns, no focal neurological symptoms.   In ED, pt noted to be hemodynamically stable, VSS. WBC 21.8K, UA consistent with PNA. Pt started on Rocephin and [facility] asked to admit for further evaluation.   Assessment and Plan: Active Problems: Sepsis secondary to UTI - criteria for sepsis met on admission with HT 110 - 120's, RR 25 bpm, WBC 21K - source UTI - sepsis order set in place, will continue Rocephin as started in ED - follow up on lactic acid and procalcitonin level - follow up on urine and blood cultures  HTN - stable BP on admission - continue home medical regimen with Metoprolol HLD - continue statin  Dementia - appears to be mild and stable  DVT prophylaxis - Lovenox SQ  Radiological Exams on Admission: Dg Chest 2 View  05/21/2014   CLINICAL DATA:  Dysuria.  Increased sleepiness  EXAM: CHEST  2 VIEW  COMPARISON:  05/30/2013  FINDINGS: Mild cardiomegaly which is stable from previous. There is chronic bulky mitral annular calcification. Mild aortic tortuosity.  Chronic postsurgical volume loss on the left with hilar clips. There is no edema, consolidation, effusion, or pneumothorax.  Remote T12 compression fracture  with height loss that is similar to July 2015 abdominal CT.  IMPRESSION: Stable postoperative appearance of the chest. No acute cardiopulmonary disease.   Electronically Signed   By: Monte Fantasia M.D.   On: 05/21/2014 15:11   Ct Head Wo Contrast  05/21/2014   CLINICAL DATA:  Altered mental status.  Dementia.  EXAM: CT HEAD WITHOUT CONTRAST  TECHNIQUE: Contiguous axial images were obtained from the base of the skull through the vertex without intravenous contrast.  COMPARISON:  MRI 04/08/2014  FINDINGS: Moderate to advanced atrophy. Negative for hydrocephalus. Moderate chronic microvascular ischemic change in the white matter.  Negative for acute infarct.  Negative for hemorrhage or mass.  Calvarium intact. Mucosal edema left map maxillary sinus. Atherosclerotic calcification.  IMPRESSION: Atrophy and chronic microvascular ischemia. No acute intracranial abnormality.   Electronically Signed   By: Franchot Gallo M.D.   On: 05/21/2014 15:04    Code Status: Full Family Communication: Pt and family at bedside Disposition Plan: Admit for further evaluation    [name] [phone]    Review of Systems:  Constitutional:  Negative for diaphoresis.  HENT: Negative for hearing loss, ear pain, nosebleeds, congestion, sore throat, neck pain, tinnitus and ear discharge.   Eyes: Negative for blurred vision, double vision, photophobia, pain, discharge and redness.  Respiratory: Negative for cough, hemoptysis, sputum production, shortness of breath, wheezing and stridor.   Cardiovascular: Negative for chest pain, palpitations, orthopnea, claudication and leg swelling.  Gastrointestinal: Negative for nausea, vomiting and abdominal pain.  Genitourinary: Negative for hematuria  and flank pain.  Musculoskeletal: Negative for myalgias, back pain, joint pain and falls.  Skin: Negative for itching and rash.  Neurological: Negative for dizziness. Endo/Heme/Allergies: Negative for environmental allergies and  polydipsia. Does not bruise/bleed easily.  Psychiatric/Behavioral: Negative for suicidal ideas. The patient is not nervous/anxious.      Past Medical History  Diagnosis Date  . Atrial fibrillation, permanent   . Hyperlipidemia   . Hypertension   . UTI (urinary tract infection)   . Dementia   . Hyperlipidemia   . Bladder cancer     hx of surgery and chemo  . Lung cancer     Status post surgery resection    Past Surgical History  Procedure Laterality Date  . Hernia repair  2006  . Cataract extraction, bilateral  1998, 2000  . Cystectomy  2002  . Lung removal, partial      Social History:  reports that he quit smoking about 22 years ago. He has never used smokeless tobacco. He reports that he does not drink alcohol or use illicit drugs.  Allergies  Allergen Reactions  . Sulfonamide Derivatives Nausea And Vomiting    Family History  Problem Relation Age of Onset  . Cancer Maternal Aunt     unknown  . Cancer Maternal Grandmother     unknown    Medication Sig  atorvastatin (LIPITOR) 10 MG tablet Take 10 mg by mouth daily.  esomeprazole 40 MG capsule Take 40 mg by mouth daily as needed.  metoprolol succinate  Take 50 mg by mouth daily. .  sertraline (ZOLOFT) 100 MG tablet Take 100 mg by mouth daily.  HYDROcodone-acetaminophe Take 1 tablet by mouth every 6 (six) prn    Physical Exam: Filed Vitals:   05/21/14 1301 05/21/14 1307 05/21/14 1530  BP:  156/114 150/90  Pulse:  119 91  Temp:  98.4 F (36.9 C)   TempSrc:  Oral   Resp:  25 24  SpO2: 98% 97% 96%    Physical Exam  Constitutional: Appears well-developed and well-nourished. No distress.  HENT: Normocephalic. External right and left ear normal. Oropharynx is clear and moist.  Eyes: Conjunctivae and EOM are normal. PERRLA, no scleral icterus.  Neck: Normal ROM. Neck supple. No JVD. No tracheal deviation. No thyromegaly.  CVS: Regular rhythm, tachycardic, S1/S2 +, no murmurs, no gallops, no carotid bruit.   Pulmonary: Effort and breath sounds normal, no stridor, rhonchi, wheezes, rales.  Abdominal: Soft. BS +,  no distension, tenderness, rebound or guarding.  Musculoskeletal: Normal range of motion. No edema and no tenderness.  Lymphadenopathy: No lymphadenopathy noted, cervical, inguinal. Neuro: Alert. Normal reflexes, muscle tone coordination. No cranial nerve deficit. Skin: Skin is warm and dry. No rash noted. Not diaphoretic. No erythema. No pallor.  Psychiatric: Normal mood and affect.   Labs on Admission:  Basic Metabolic Panel:  Recent Labs Lab 05/21/14 1425  NA 142  K 3.9  CL 103  CO2 26  GLUCOSE 126*  BUN 19  CREATININE 1.16  CALCIUM 9.7   CBC:  Recent Labs Lab 05/21/14 1425  WBC 21.8*  NEUTROABS 19.0*  HGB 15.6  HCT 47.5  MCV 90.0  PLT 187   EKG: pending    If 7PM-7AM, please contact night-coverage [email or web address] Password [email or web address] 05/21/2014, 3:41 PM

## 2014-05-21 NOTE — Progress Notes (Signed)
Patient admitted to [facility], transferred from ED on hospital bed, tolerated well, report called to unit by ED Nurse, via telephone, Dr notified of patient's arrival to unit, BP= 152/95, P= 90, Temp= 98.2, Sats= 97% on RA, condom cath in place draining clear yellow urine, skin tear and abrasion on arms, hands, and bridge of nose, alert and orientation is questionable, no complaints of pain at this time, patient in stable condition at this time

## 2014-05-21 NOTE — ED Provider Notes (Signed)
CSN: [id number]     Arrival date & time 05/21/14  1259 History   First MD Initiated Contact with Patient 05/21/14 1352     Chief Complaint  Patient presents with  . Dysuria     (Consider location/radiation/quality/duration/timing/severity/associated sxs/prior Treatment) Patient is a 81 y.o. male presenting with altered mental status.  Altered Mental Status Presenting symptoms: confusion and disorientation   Severity:  Moderate Most recent episode:  Yesterday Episode history:  Continuous Timing:  Constant Progression:  Worsening Chronicity:  Recurrent Context comment:  Ho similar with prior UTI, recurrent UTI and sp bladder surgery Associated symptoms: bladder incontinence   Associated symptoms: no abdominal pain, no agitation, no fever, no nausea, no rash and no vomiting     Past Medical History  Diagnosis Date  . Atrial fibrillation, permanent   . Hyperlipidemia   . Hypertension   . UTI (urinary tract infection)   . Dementia   . Hyperlipidemia   . Bladder cancer     hx of surgery and chemo  . Lung cancer     Status post surgery resection   Past Surgical History  Procedure Laterality Date  . Hernia repair  2006  . Cataract extraction, bilateral  1998, 2000  . Cystectomy  2002  . Lung removal, partial     Family History  Problem Relation Age of Onset  . Cancer Maternal Aunt     unknown  . Cancer Maternal Grandmother     unknown   History  Substance Use Topics  . Smoking status: Former Smoker    Quit date: 10/10/1991  . Smokeless tobacco: Never Used  . Alcohol Use: No    Review of Systems  Constitutional: Negative for fever.  Gastrointestinal: Negative for nausea, vomiting and abdominal pain.  Genitourinary: Positive for bladder incontinence.  Skin: Negative for rash.  Psychiatric/Behavioral: Positive for confusion. Negative for agitation.  All other systems reviewed and are negative.     Allergies  Sulfonamide derivatives  Home Medications    Prior to Admission medications   Medication Sig Start Date End Date Taking? Authorizing Provider  ALPHA LIPOIC ACID PO Take 200-300 mg by mouth daily.   Yes Historical Provider, MD  ascorbic acid (VITAMIN C) 1000 MG tablet Take 1,000 mg by mouth daily.   Yes Historical Provider, MD  atorvastatin (LIPITOR) 10 MG tablet Take 10 mg by mouth daily.   Yes Historical Provider, MD  calcium carbonate (OS-CAL - DOSED IN MG OF ELEMENTAL CALCIUM) 1250 MG tablet Take 1 tablet by mouth daily.   Yes Historical Provider, MD  cholecalciferol (VITAMIN D) 1000 UNITS tablet Take 1,000 Units by mouth daily.   Yes Historical Provider, MD  Cyanocobalamin (VITAMIN B 12 PO) Take 500 mg by mouth daily.    Yes Historical Provider, MD  esomeprazole (NEXIUM) 40 MG capsule Take 40 mg by mouth daily as needed. For acid reflux   Yes Historical Provider, MD  hydrocortisone valerate cream (WESTCORT) 0.2 % Apply 1 application topically 2 (two) times daily as needed.    Yes Historical Provider, MD  MELATONIN PO Take 1 tablet by mouth at bedtime as needed (sleep).   Yes Historical Provider, MD  metoprolol succinate (TOPROL-XL) 50 MG 24 hr tablet Take 50 mg by mouth daily. Take with or immediately following a meal.   Yes Historical Provider, MD  sertraline (ZOLOFT) 100 MG tablet Take 100 mg by mouth daily.   Yes Historical Provider, MD  cephALEXin (KEFLEX) 500 MG capsule Take 1 capsule (500  mg total) by mouth 3 (three) times daily. Patient not taking: Reported on 05/21/2014 04/08/14   Veryl Speak, MD  HYDROcodone-acetaminophen (NORCO/VICODIN) 5-325 MG per tablet Take 1 tablet by mouth every 6 (six) hours as needed for pain. Patient not taking: Reported on 04/08/2014 12/31/11   Orlie Dakin, MD   BP 148/90 mmHg  Pulse 84  Temp(Src) 98 F (36.7 C) (Oral)  Resp 22  Ht 6' (1.829 m)  Wt 201 lb 15.1 oz (91.6 kg)  BMI 27.38 kg/m2  SpO2 97% Physical Exam  Constitutional: He appears well-developed and well-nourished.  HENT:   Head: Normocephalic and atraumatic.  Eyes: Conjunctivae and EOM are normal.  Neck: Normal range of motion. Neck supple.  Cardiovascular: Normal rate, regular rhythm and normal heart sounds.   Pulmonary/Chest: Effort normal and breath sounds normal. No respiratory distress.  Abdominal: He exhibits no distension. There is no tenderness. There is no rebound and no guarding.  Musculoskeletal: Normal range of motion.  Neurological: He is alert.  No focal neuro deficits  Skin: Skin is warm and dry.  Vitals reviewed.   ED Course  Procedures (including critical care time) Labs Review Labs Reviewed  URINALYSIS, ROUTINE W REFLEX MICROSCOPIC - Abnormal; Notable for the following:    APPearance TURBID (*)    Hgb urine dipstick MODERATE (*)    Protein, ur 30 (*)    Leukocytes, UA LARGE (*)    All other components within normal limits  CBC WITH DIFFERENTIAL/PLATELET - Abnormal; Notable for the following:    WBC 21.8 (*)    Neutrophils Relative % 87 (*)    Neutro Abs 19.0 (*)    Lymphocytes Relative 3 (*)    Lymphs Abs 0.6 (*)    Monocytes Absolute 2.1 (*)    All other components within normal limits  BASIC METABOLIC PANEL - Abnormal; Notable for the following:    Glucose, Bld 126 (*)    GFR calc non Af Amer 57 (*)    GFR calc Af Amer 66 (*)    All other components within normal limits  URINE MICROSCOPIC-ADD ON - Abnormal; Notable for the following:    Bacteria, UA FEW (*)    All other components within normal limits  BASIC METABOLIC PANEL - Abnormal; Notable for the following:    Potassium 3.3 (*)    Glucose, Bld 124 (*)    GFR calc non Af Amer 62 (*)    GFR calc Af Amer 71 (*)    All other components within normal limits  CBC - Abnormal; Notable for the following:    WBC 17.9 (*)    All other components within normal limits  URINE CULTURE  CULTURE, BLOOD (ROUTINE X 2)  CULTURE, BLOOD (ROUTINE X 2)  LACTIC ACID, PLASMA  LACTIC ACID, PLASMA  PROCALCITONIN  PROTIME-INR  APTT   I-STAT TROPOININ, ED    Imaging Review Dg Chest 2 View  05/21/2014   CLINICAL DATA:  Dysuria.  Increased sleepiness  EXAM: CHEST  2 VIEW  COMPARISON:  05/30/2013  FINDINGS: Mild cardiomegaly which is stable from previous. There is chronic bulky mitral annular calcification. Mild aortic tortuosity.  Chronic postsurgical volume loss on the left with hilar clips. There is no edema, consolidation, effusion, or pneumothorax.  Remote T12 compression fracture with height loss that is similar to July 2015 abdominal CT.  IMPRESSION: Stable postoperative appearance of the chest. No acute cardiopulmonary disease.   Electronically Signed   By: Monte Fantasia M.D.   On:  05/21/2014 15:11   Ct Head Wo Contrast  05/21/2014   CLINICAL DATA:  Altered mental status.  Dementia.  EXAM: CT HEAD WITHOUT CONTRAST  TECHNIQUE: Contiguous axial images were obtained from the base of the skull through the vertex without intravenous contrast.  COMPARISON:  MRI 04/08/2014  FINDINGS: Moderate to advanced atrophy. Negative for hydrocephalus. Moderate chronic microvascular ischemic change in the white matter.  Negative for acute infarct.  Negative for hemorrhage or mass.  Calvarium intact. Mucosal edema left map maxillary sinus. Atherosclerotic calcification.  IMPRESSION: Atrophy and chronic microvascular ischemia. No acute intracranial abnormality.   Electronically Signed   By: Franchot Gallo M.D.   On: 05/21/2014 15:04     EKG Interpretation   Date/Time:  Wednesday May 21 2014 15:25:13 EDT Ventricular Rate:  118 PR Interval:    QRS Duration: 96 QT Interval:  340 QTC Calculation: 476 R Axis:   -29 Text Interpretation:  Atrial fibrillation Ventricular premature complex  Abnormal R-wave progression, late transition Inferior infarct, old No  significant change since last tracing Confirmed by Debby Freiberg [phone])  on 05/21/2014 3:37:36 PM      MDM   Final diagnoses:  Acute cystitis without hematuria  Delirium     81 y.o. male with pertinent PMH of recurrent UTI after bladder surgery, prior delirium due to UTI, dementia(normal functional status with complete orientation) presents with recurrent confusion, concern for UTI.  Physical exam as above without focal neuro deficit.  Wu with UTI, otherwise unremarkable to explain confusion.  Consulted medicine and pt admitted.    I have reviewed all laboratory and imaging studies if ordered as above  1. Acute cystitis without hematuria   2. Delirium         Debby Freiberg, MD 05/22/14 [id number]

## 2014-05-22 DIAGNOSIS — N3000 Acute cystitis without hematuria: Secondary | ICD-10-CM

## 2014-05-22 LAB — CBC
HCT: 42.2 % (ref 39.0–52.0)
Hemoglobin: 13.5 g/dL (ref 13.0–17.0)
MCH: 29 pg (ref 26.0–34.0)
MCHC: 32 g/dL (ref 30.0–36.0)
MCV: 90.8 fL (ref 78.0–100.0)
PLATELETS: 158 10*3/uL (ref 150–400)
RBC: 4.65 MIL/uL (ref 4.22–5.81)
RDW: 14.6 % (ref 11.5–15.5)
WBC: 17.9 10*3/uL — ABNORMAL HIGH (ref 4.0–10.5)

## 2014-05-22 LAB — BASIC METABOLIC PANEL
Anion gap: 9 (ref 5–15)
BUN: 23 mg/dL (ref 6–23)
CALCIUM: 8.8 mg/dL (ref 8.4–10.5)
CHLORIDE: 105 mmol/L (ref 96–112)
CO2: 24 mmol/L (ref 19–32)
CREATININE: 1.09 mg/dL (ref 0.50–1.35)
GFR calc Af Amer: 71 mL/min — ABNORMAL LOW (ref >90)
GFR calc non Af Amer: 62 mL/min — ABNORMAL LOW (ref >90)
Glucose, Bld: 124 mg/dL — ABNORMAL HIGH (ref 70–99)
Potassium: 3.3 mmol/L — ABNORMAL LOW (ref 3.5–5.1)
Sodium: 138 mmol/L (ref 135–145)

## 2014-05-22 MED ORDER — POTASSIUM CHLORIDE CRYS ER 20 MEQ PO TBCR
40.0000 meq | EXTENDED_RELEASE_TABLET | Freq: Once | ORAL | Status: AC
  Administered 2014-05-22: 40 meq via ORAL
  Filled 2014-05-22: qty 2

## 2014-05-22 NOTE — Progress Notes (Signed)
Utilization review completed.  

## 2014-05-22 NOTE — Progress Notes (Signed)
Patient ID: [name], male   DOB: 1930-08-14, 81 y.o.   MRN: [id number]  [facility] PROGRESS NOTE  [name] [id number] DOB: [birth year] DOA: 05/21/2014 PCP: [name], MD   Brief narrative:    Pt is 81 yo male with known HTN, dementia, able to perform all ADL's at home, loves with family, presented with main concern of 1-2 days duration of worsening dysuria and more lethargy, poor oral intake. Family explains that pt is rather independent at baseline but he did appear more bed bound over the past 24 hours. He has history of recurrent UTI's and family was concerned for UTI being the cause of the symptoms. Pt denies fevers or chills, has noted urinary urgency and frequency and malodorous urine. Pt denies chest pain or shortness of breath, no abd concerns, no focal neurological symptoms.   In ED, pt noted to be hemodynamically stable, VSS. WBC 21.8K, UA consistent with PNA. Pt started on Rocephin and TR[facility] asked to admit for further evaluation.   Assessment/Plan:    Active Problems: Sepsis secondary to UTI - criteria for sepsis met on admission with HT 110 - 120's, RR 25 bpm, WBC 21K - source UTI - sepsis order set in place, will continue Rocephin day #2 - WBC trending down  - follow up on urine and blood cultures  HTN - stable BP on admission - continue home medical regimen with Metoprolol HLD - continue statin  Hypokalemia - supplement and repeat BMP in AM Dementia - appears to be mild and stable  DVT prophylaxis - Lovenox SQ  Code Status: Full.  Family Communication:  plan of care discussed with the patient Disposition Plan: Home when stable.   IV access:  Peripheral IV  Procedures and diagnostic studies:    Dg Chest 2 View  05/21/2014  Stable postoperative appearance of the chest. No acute cardiopulmonary disease.     Ct Head Wo Contrast  05/21/2014  Atrophy and chronic microvascular ischemia. No acute intracranial abnormality.     Medical Consultants:  None   Other Consultants:  None   IAnti-Infectives:   Rocephin 3/30 -->  Faye Ramsay, MD  [facility] Pager [phone]  If 7PM-7AM, please contact night-coverage [email or web address] Password [email or web address] 05/22/2014, 11:18 AM   LOS: 1 day   HPI/Subjective: No events overnight.   Objective: Filed Vitals:   05/21/14 1624 05/21/14 1727 05/21/14 2111 05/22/14 0610  BP: 161/106 152/95 136/101 148/90  Pulse:  90 84 84  Temp:  98.2 F (36.8 C) 97.8 F (36.6 C) 98 F (36.7 C)  TempSrc:  Oral Oral Oral  Resp: _0 Height:  6' (1.829 m)    Weight:  91.6 kg (201 lb 15.1 oz)    SpO2:  97% 96% 97%    Intake/Output Summary (Last 24 hours) at 05/22/14 1118 Last data filed at 05/22/14 0932  Gross per 24 hour  Intake 1381.25 ml  Output      0 ml  Net 1381.25 ml    Exam:   General:  Pt is alert, follows commands appropriately, not in acute distress  Cardiovascular: Regular rate and rhythm, no rubs, no gallops  Respiratory: Clear to auscultation bilaterally, no wheezing, no crackles, no rhonchi  Abdomen: Soft, non tender, non distended, bowel sounds present, no guarding   Data Reviewed: Basic Metabolic Panel:  Recent Labs Lab 05/21/14 1425 05/22/14 0507  NA 142 138  K 3.9 3.3*  CL 103 105  CO2 26 24  GLUCOSE 126*  124*  BUN 19 23  CREATININE 1.16 1.09  CALCIUM 9.7 8.8   CBC:  Recent Labs Lab 05/21/14 1425 05/22/14 0507  WBC 21.8* 17.9*  NEUTROABS 19.0*  --   HGB 15.6 13.5  HCT 47.5 42.2  MCV 90.0 90.8  PLT 187 158    Scheduled Meds: . atorvastatin  10 mg Oral Daily  . cefTRIAXone (ROCEPHIN)  IV  1 g Intravenous Q24H  . cholecalciferol  1,000 Units Oral Daily  . enoxaparin (LOVENOX) injection  40 mg Subcutaneous Q24H  . metoprolol succinate  50 mg Oral Daily  . pantoprazole  40 mg Oral Daily  . sertraline  100 mg Oral Daily  . ascorbic acid  1,000 mg Oral Daily   Continuous Infusions: . sodium chloride 75 mL/hr (05/21/14  1809)

## 2014-05-23 LAB — CBC
HCT: 43.5 % (ref 39.0–52.0)
Hemoglobin: 14.1 g/dL (ref 13.0–17.0)
MCH: 29.6 pg (ref 26.0–34.0)
MCHC: 32.4 g/dL (ref 30.0–36.0)
MCV: 91.2 fL (ref 78.0–100.0)
Platelets: 154 10*3/uL (ref 150–400)
RBC: 4.77 MIL/uL (ref 4.22–5.81)
RDW: 14.5 % (ref 11.5–15.5)
WBC: 12.4 10*3/uL — ABNORMAL HIGH (ref 4.0–10.5)

## 2014-05-23 LAB — BASIC METABOLIC PANEL
ANION GAP: 9 (ref 5–15)
BUN: 20 mg/dL (ref 6–23)
CHLORIDE: 103 mmol/L (ref 96–112)
CO2: 26 mmol/L (ref 19–32)
CREATININE: 1.19 mg/dL (ref 0.50–1.35)
Calcium: 8.8 mg/dL (ref 8.4–10.5)
GFR, EST AFRICAN AMERICAN: 64 mL/min — AB (ref >90)
GFR, EST NON AFRICAN AMERICAN: 55 mL/min — AB (ref >90)
Glucose, Bld: 107 mg/dL — ABNORMAL HIGH (ref 70–99)
Potassium: 3.8 mmol/L (ref 3.5–5.1)
Sodium: 138 mmol/L (ref 135–145)

## 2014-05-23 MED ORDER — HYDROCODONE-ACETAMINOPHEN 5-325 MG PO TABS: 1.0000 | ORAL_TABLET | Freq: Four times a day (QID) | ORAL | Status: DC | PRN

## 2014-05-23 NOTE — Progress Notes (Signed)
Patient ID: [name], male   DOB: February 17, 1931, 81 y.o.   MRN: [id number]  [facility] PROGRESS NOTE  [name] [id number] DOB: 1930-08-01 DOA: 05/21/2014 PCP: [name], MD   Brief narrative:    Pt is 81 yo male with known HTN, dementia, able to perform all ADL's at home, loves with family, presented with main concern of 1-2 days duration of worsening dysuria and more lethargy, poor oral intake. Family explains that pt is rather independent at baseline but he did appear more bed bound over the past 24 hours. He has history of recurrent UTI's and family was concerned for UTI being the cause of the symptoms. Pt denies fevers or chills, has noted urinary urgency and frequency and malodorous urine. Pt denies chest pain or shortness of breath, no abd concerns, no focal neurological symptoms.   In ED, pt noted to be hemodynamically stable, VSS. WBC 21.8K, UA consistent with PNA. Pt started on Rocephin and TRH asked to admit for further evaluation.   Assessment/Plan:    Active Problems: Sepsis secondary to UTI - criteria for sepsis met on admission with HT 110 - 120's, RR 25 bpm, WBC 21K - source UTI - sepsis order set in place, will continue Rocephin day #3 - WBC trending down  - urine culture pending  HTN - fairly reasonable inpatient control  - continue home medical regimen with Metoprolol HLD - continue statin  Hypokalemia - supplemented and WNL Dementia - appears to be mild and stable  DVT prophylaxis - Lovenox SQ  Code Status: Full.  Family Communication:  plan of care discussed with the patient Disposition Plan: Pending PT, home vs SNF  IV access:  Peripheral IV  Procedures and diagnostic studies:    Dg Chest 2 View  05/21/2014  Stable postoperative appearance of the chest. No acute cardiopulmonary disease.     Ct Head Wo Contrast  05/21/2014  Atrophy and chronic microvascular ischemia. No acute intracranial abnormality.    Medical  Consultants:  None   Other Consultants:  None   IAnti-Infectives:   Rocephin 3/30 -->  Dennis Ramsay, MD  [facility] Pager [phone]  If 7PM-7AM, please contact night-coverage [email or web address] Password [email or web address] 05/23/2014, 10:20 AM   LOS: 2 days   HPI/Subjective: No events overnight.   Objective: Filed Vitals:   05/22/14 0610 05/22/14 1447 05/22/14 2247 05/23/14 0645  BP: 148/90 149/85 128/97 162/96  Pulse: 84 74 82 70  Temp: 98 F (36.7 C) 98.1 F (36.7 C) 98.1 F (36.7 C) 98.4 F (36.9 C)  TempSrc: Oral Oral Oral   Resp: '22 20 20 20  ' Height:      Weight:      SpO2: 97% 98% 96% 97%    Intake/Output Summary (Last 24 hours) at 05/23/14 1020 Last data filed at 05/23/14 0900  Gross per 24 hour  Intake    240 ml  Output      0 ml  Net    240 ml    Exam:   General:  Pt is alert, follows commands appropriately, not in acute distress  Cardiovascular: Regular rate and rhythm, no rubs, no gallops  Respiratory: Clear to auscultation bilaterally, no wheezing, no crackles, no rhonchi  Abdomen: Soft, non tender, non distended, bowel sounds present, no guarding   Data Reviewed: Basic Metabolic Panel:  Recent Labs Lab 05/21/14 1425 05/22/14 0507 05/23/14 0515  NA 142 138 138  K 3.9 3.3* 3.8  CL 103 105 103  CO2 26 24 26  GLUCOSE 126* 124* 107*  BUN '19 23 20  ' CREATININE 1.16 1.09 1.19  CALCIUM 9.7 8.8 8.8   CBC:  Recent Labs Lab 05/21/14 1425 05/22/14 0507 05/23/14 0515  WBC 21.8* 17.9* 12.4*  NEUTROABS 19.0*  --   --   HGB 15.6 13.5 14.1  HCT 47.5 42.2 43.5  MCV 90.0 90.8 91.2  PLT 187 158 154    Scheduled Meds: . atorvastatin  10 mg Oral Daily  . cefTRIAXone (ROCEPHIN)  IV  1 g Intravenous Q24H  . cholecalciferol  1,000 Units Oral Daily  . enoxaparin (LOVENOX) injection  40 mg Subcutaneous Q24H  . metoprolol succinate  50 mg Oral Daily  . pantoprazole  40 mg Oral Daily  . sertraline  100 mg Oral Daily  . ascorbic acid  1,000 mg Oral Daily    Continuous Infusions:

## 2014-05-23 NOTE — Care Management Note (Addendum)
CARE MANAGEMENT NOTE 05/23/2014  Patient:  [name], [name]   Account Number:  [id number]  Date Initiated:  05/23/2014  Documentation initiated by:  Marney Doctor  Subjective/Objective Assessment:   81 yo admitted with UTI. Hx of HTN, dementia     Action/Plan:   From home with granddaughter   Anticipated DC Date:  05/24/2014   Anticipated DC Plan:  [place]  CM consult      [facility] Choice  HOME HEALTH   Choice offered to / List presented to:  C-4 Adult Children   DME arranged  BEDSIDE COMMODE        HH arranged  HH-1 RN  HH-2 PT      Status of service:  In process, will continue to follow Medicare Important Message given?   (If response is "NO", the following Medicare IM given date fields will be blank) Date Medicare IM given:   Medicare IM given by:   Date Additional Medicare IM given:   Additional Medicare IM given by:    Discharge Disposition:    Per UR Regulation:  Reviewed for med. necessity/level of care/duration of stay  If discussed at [place] of Stay Meetings, dates discussed:    Comments:  05/23/14 Marney Doctor RN,BSN,NCM  [id number] Spoke with [name] who chose [facility] for [facility] services. WIll need MD order for HHPT/OT/RN/Aide. [facility] rep called and given referral. DME rep also given referral for [facility] to be delivered to pt home.   05/23/14 Marney Doctor RN,BSN,NCM [phone] Please see EDCM note.  PT is recommending SNF. Spoke with pt granddaughter [name] who states that pt wants to go home and she is willing to take him home.  She states she is home with him 24hrs and is working on finding a Magazine features editor to stay with him when she has to run errands. [name] received the [place] list from the [facility] and she states she will look over it and decide who they would like to use for [facility] services. Pt would also like to have a BSC. Will ask MD for order. CM will continue to follow. CSW made aware of conversation.

## 2014-05-23 NOTE — Evaluation (Signed)
Physical Therapy Evaluation Patient Details Name: [name] MRN: [id number] DOB: 21-Apr-1930 Today's Date: 05/23/2014   History of Present Illness  Pt lives at home with family (unclear of pt's PLOF but chart stated pt was independent with ADLs and waling with rollator) however pt reports falls. Unclear of if family assits 24/7 as well. Admit with increased weakness, and confusion, admit with UTI and hx of dementia.   Clinical Impression  Pt with decreased ability with all mobility including bed mobility , transfers and ambulation. To benefit from PT to increase ability, and strength to improve mobility in order to eventually DC back home with family per pt.      Follow Up Recommendations SNF (unless pt progresses more while here and/or family can assist at this level. )    Equipment Recommendations  Other (comment) (unknown what he has or may need , will have to involve family)    Recommendations for Other Services       Precautions / Restrictions Precautions Precautions: Fall      Mobility  Bed Mobility               General bed mobility comments: pt was already sitting EOB with nrusing (after nursing had just cleaned pt up )  Transfers Overall transfer level: Needs assistance Equipment used: Rolling walker (2 wheeled) Transfers: Sit to/from Stand Sit to Stand: Mod assist;+2 physical assistance         General transfer comment: cues for hand plamcnet, safety and cues for initiating movement. Starting with R lean in sitting making it difficult as well.   Ambulation/Gait Ambulation/Gait assistance: Min assist;+2 safety/equipment Ambulation Distance (Feet): 50 Feet Assistive device: Rolling walker (2 wheeled) Gait Pattern/deviations: Shuffle;Decreased stride length;Festinating Gait velocity: slow   General Gait Details: had to cue pt with visual to tae first big step, however after about 5 steps, pt began to fesinate then had to cue to stop and restart  again with cueing. muc like a parkinson -like giat pattern with cueing required and/or rhythm audible to help keep cadence.   Stairs            Wheelchair Mobility    Modified Rankin (Stroke Patients Only)       Balance Overall balance assessment: Needs assistance Sitting-balance support: Bilateral upper extremity supported;Feet supported Sitting balance-Leahy Scale: Fair       Standing balance-Leahy Scale: Fair Standing balance comment: supported at RW , sometimes with lean to Right and cued to stand up in mid line                             Pertinent Vitals/Pain Pain Assessment: Faces Faces Pain Scale: No hurt    Home Living Family/patient expects to be discharged to:: Private residence Living Arrangements: Other relatives Armed forces technical officer of who pt livs with per patient) Available Help at Discharge: Family Type of Home: House Home Access: Stairs to enter Entrance Stairs-Rails: Right Entrance Stairs-Number of Steps: 2-3 Home Layout: One level Home Equipment: Walker - 4 wheels Additional Comments: unlcear of details or history given by pt    Prior Function Level of Independence: Independent with assistive device(s)         Comments: unlcear of PLOF. Stated he uses rollator (RW with 4 wheels) and dtr cooks for him, but states he has had many falls.      Hand Dominance        Extremity/Trunk Assessment  Lower Extremity Assessment: Generalized weakness (pt stated he is not like he normally is at home , this is much worse and he is a lot weakner than normal. )         Communication   Communication: Expressive difficulties (seems to have difficulties expressively communicating. Uses fingers a lot or answers in 1 word gestures. He also was unclear of where he was, seeing family who was not there as well. )  Cognition Arousal/Alertness: Awake/alert Behavior During Therapy: Flat affect (very limited with communication and did follow  1 step commands adn or demonstartions for cues )                        General Comments      Exercises        Assessment/Plan    PT Assessment Patient needs continued PT services  PT Diagnosis Difficulty walking;Generalized weakness   PT Problem List Decreased strength;Decreased range of motion;Decreased activity tolerance;Decreased balance;Decreased mobility;Decreased knowledge of use of DME;Decreased safety awareness  PT Treatment Interventions DME instruction;Gait training;Stair training;Functional mobility training;Therapeutic activities;Therapeutic exercise;Patient/family education   PT Goals (Current goals can be found in the Care Plan section) Acute Rehab PT Goals Patient Stated Goal: pt stated he wants to get stronger , stating " I am weak"  PT Goal Formulation: With patient Time For Goal Achievement: 06/06/14 Potential to Achieve Goals: Good    Frequency Min 3X/week   Barriers to discharge        Co-evaluation               End of Session Equipment Utilized During Treatment: Gait belt Activity Tolerance: Patient tolerated treatment well Patient left: in chair;with chair alarm set Nurse Communication: Mobility status         Time: 1040-1107 PT Time Calculation (min) (ACUTE ONLY): 27 min   Charges:   PT Evaluation $Initial PT Evaluation Tier I: 1 Procedure PT Treatments $Therapeutic Activity: 8-22 mins   PT G CodesClide Dales 2014/05/25, 1:49 PM  Clide Dales, PT Pager: [phone] 05/25/14

## 2014-05-24 LAB — BASIC METABOLIC PANEL
ANION GAP: 9 (ref 5–15)
BUN: 22 mg/dL (ref 6–23)
CO2: 26 mmol/L (ref 19–32)
Calcium: 8.7 mg/dL (ref 8.4–10.5)
Chloride: 104 mmol/L (ref 96–112)
Creatinine, Ser: 1 mg/dL (ref 0.50–1.35)
GFR calc non Af Amer: 68 mL/min — ABNORMAL LOW (ref >90)
GFR, EST AFRICAN AMERICAN: 79 mL/min — AB (ref >90)
Glucose, Bld: 114 mg/dL — ABNORMAL HIGH (ref 70–99)
Potassium: 4.1 mmol/L (ref 3.5–5.1)
Sodium: 139 mmol/L (ref 135–145)

## 2014-05-24 LAB — CBC
HEMATOCRIT: 43.1 % (ref 39.0–52.0)
HEMOGLOBIN: 13.9 g/dL (ref 13.0–17.0)
MCH: 29.1 pg (ref 26.0–34.0)
MCHC: 32.3 g/dL (ref 30.0–36.0)
MCV: 90.4 fL (ref 78.0–100.0)
Platelets: 172 10*3/uL (ref 150–400)
RBC: 4.77 MIL/uL (ref 4.22–5.81)
RDW: 14.5 % (ref 11.5–15.5)
WBC: 11.6 10*3/uL — AB (ref 4.0–10.5)

## 2014-05-24 LAB — URINE CULTURE

## 2014-05-24 MED ORDER — LEVOFLOXACIN 500 MG PO TABS: 500.0000 mg | ORAL_TABLET | Freq: Every day | ORAL | Status: DC

## 2014-05-24 MED ORDER — CEFTRIAXONE SODIUM IN DEXTROSE 20 MG/ML IV SOLN
1.0000 g | Freq: Once | INTRAVENOUS | Status: AC
  Administered 2014-05-24: 1 g via INTRAVENOUS
  Filled 2014-05-24: qty 50

## 2014-05-24 NOTE — Progress Notes (Signed)
CARE MANAGEMENT NOTE 05/24/2014  Patient:  [name], [name]   Account Number:  [id number]  Date Initiated:  05/23/2014  Documentation initiated by:  Marney Doctor  Subjective/Objective Assessment:   81 yo admitted with UTI. Hx of HTN, dementia     Action/Plan:   From home with granddaughter   Anticipated DC Date:  05/24/2014   Anticipated DC Plan:  [place]  CM consult      [facility] Choice  HOME HEALTH   Choice offered to / List presented to:  C-4 Adult Children   DME arranged  3-N-1      DME agency  [place] arranged  HH-1 RN  HH-2 PT      Status of service:  In process, will continue to follow Medicare Important Message given?  YES (If response is "NO", the following Medicare IM given date fields will be blank) Date Medicare IM given:  05/24/2014 Medicare IM given by:  [facility] Date Additional Medicare IM given:   Additional Medicare IM given by:    Discharge Disposition:  [place]  Per UR Regulation:  Reviewed for med. necessity/level of care/duration of stay  If discussed at [place] of Stay Meetings, dates discussed:    Comments:  05/24/2014 1320 NCM spooke to granddtr, [name] Prophet # [phone]. States pt lives in the home with her. Notified [facility] for 3n1 for home and HH. Pt scheduled dc home today. Jonnie Finner RN CCM Case Mgmt phone [phone]   05/23/14 Marney Doctor RN,BSN,NCM [phone] Please see EDCM note.  PT is recommending SNF. Spoke with pt granddaughter [name] who states that pt wants to go home and she is willing to take him home.  She states she is home with him 24hrs and is working on finding a Magazine features editor to stay with him when she has to run errands. [name] received the [place] list from the [facility] and she states she will look over it and decide who they would like to use for [facility] services. Pt would also like to have a BSC. Will ask MD for order.  CM will continue to follow. CSW made aware of conversation.

## 2014-05-24 NOTE — Discharge Instructions (Signed)

## 2014-05-24 NOTE — Progress Notes (Signed)
Discharge instructions given to [name],patient's granddaughter who is the care taker.Instructions given on medications,and follow up visits, family verbalized understanding. Prescriptions sent home with patient. Home Health is to also follow up with patient. No c/o pain or discomfort noted at this time. Accompanied by staff to an awaiting vehicle.

## 2014-05-24 NOTE — Discharge Summary (Signed)
Physician Discharge Summary  [name] JOA:[id number] DOB: Jan 27, 1931 DOA: 05/21/2014  PCP: [name], MD  Admit date: 05/21/2014 Discharge date: 05/24/2014  Recommendations for Outpatient Follow-up:  1. Pt will need to follow up with PCP in 2-3 weeks post discharge 2. Please obtain BMP to evaluate electrolytes and kidney function 3. Please also check CBC to evaluate Hg and Hct levels 4. Levaquin for 5 more days   Discharge Diagnoses:  Principal Problem:   UTI (urinary tract infection)   Discharge Condition: Stable  Diet recommendation: Heart healthy diet discussed in details    Brief narrative:    Pt is 81 yo male with known HTN, dementia, able to perform all ADL's at home, loves with family, presented with main concern of 1-2 days duration of worsening dysuria and more lethargy, poor oral intake. Family explains that pt is rather independent at baseline but he did appear more bed bound over the past 24 hours. He has history of recurrent UTI's and family was concerned for UTI being the cause of the symptoms. Pt denies fevers or chills, has noted urinary urgency and frequency and malodorous urine. Pt denies chest pain or shortness of breath, no abd concerns, no focal neurological symptoms.   In ED, pt noted to be hemodynamically stable, VSS. WBC 21.8K, UA consistent with PNA. Pt started on Rocephin and TRH asked to admit for further evaluation.   Assessment/Plan:    Active Problems: Sepsis secondary to UTI - criteria for sepsis met on admission with HT 110 - 120's, RR 25 bpm, WBC 21K - source UTI, Pseudomonas  - sepsis order set in place, - pt started initially on Rocephin and transitioned to Levaquin upon discharge  - WBC trending down  HTN - fairly reasonable inpatient control  - continue home medical regimen with Metoprolol HLD - continue statin  Hypokalemia - supplemented and WNL Dementia - appears to be mild and stable   Code Status: Full.   Family Communication: plan of care discussed with the patient and granddaughter at bedside  Disposition Plan: Wants to go home today   IV access:  Peripheral IV  Procedures and diagnostic studies:   Dg Chest 2 View 05/21/2014 Stable postoperative appearance of the chest. No acute cardiopulmonary disease.   Ct Head Wo Contrast 05/21/2014 Atrophy and chronic microvascular ischemia. No acute intracranial abnormality.   Medical Consultants:  None   Other Consultants:  None   IAnti-Infectives:   Rocephin 3/30 --> 4/2 Levaquin 4/2 --> continue for 5 more days post discharge      Discharge Exam: Filed Vitals:   05/24/14 1049  BP: 130/72  Pulse: 80  Temp:   Resp:    Filed Vitals:   05/23/14 1406 05/23/14 2112 05/24/14 0500 05/24/14 1049  BP: 122/80 129/67 158/91 130/72  Pulse: 50 55 57 80  Temp: 98.8 F (37.1 C) 98.4 F (36.9 C) 98.2 F (36.8 C)   TempSrc: Oral Oral Oral   Resp: '20 20 18   ' Height:      Weight:      SpO2: 91% 97% 97%     General: Pt is alert, follows commands appropriately, not in acute distress Cardiovascular: Regular rate and rhythm, no rubs, no gallops Respiratory: Clear to auscultation bilaterally, no wheezing, no crackles, no rhonchi Abdominal: Soft, non tender, non distended, bowel sounds +, no guarding Extremities: no edema, no cyanosis, pulses palpable bilaterally DP and PT  Discharge Instructions  Discharge Instructions    Diet - low sodium heart healthy  Complete by:  As directed      Increase activity slowly    Complete by:  As directed             Medication List    STOP taking these medications        cephALEXin 500 MG capsule  Commonly known as:  KEFLEX      TAKE these medications        ALPHA LIPOIC ACID PO  Take 200-300 mg by mouth daily.     ascorbic acid 1000 MG tablet  Commonly known as:  VITAMIN C  Take 1,000 mg by mouth daily.     atorvastatin 10 MG tablet  Commonly known as:   LIPITOR  Take 10 mg by mouth daily.     calcium carbonate 1250 (500 CA) MG tablet  Commonly known as:  OS-CAL - dosed in mg of elemental calcium  Take 1 tablet by mouth daily.     cholecalciferol 1000 UNITS tablet  Commonly known as:  VITAMIN D  Take 1,000 Units by mouth daily.     esomeprazole 40 MG capsule  Commonly known as:  NEXIUM  Take 40 mg by mouth daily as needed. For acid reflux     HYDROcodone-acetaminophen 5-325 MG per tablet  Commonly known as:  NORCO/VICODIN  Take 1 tablet by mouth every 6 (six) hours as needed.     hydrocortisone valerate cream 0.2 %  Commonly known as:  WESTCORT  Apply 1 application topically 2 (two) times daily as needed.     levofloxacin 500 MG tablet  Commonly known as:  LEVAQUIN  Take 1 tablet (500 mg total) by mouth daily.     MELATONIN PO  Take 1 tablet by mouth at bedtime as needed (sleep).     metoprolol succinate 50 MG 24 hr tablet  Commonly known as:  TOPROL-XL  Take 50 mg by mouth daily. Take with or immediately following a meal.     sertraline 100 MG tablet  Commonly known as:  ZOLOFT  Take 100 mg by mouth daily.     VITAMIN B 12 PO  Take 500 mg by mouth daily.           Follow-up Information    Follow up with Faye Ramsay, MD.   Specialty:  Internal Medicine   Why:  As needed call my cell phone [phone]   Contact information:   [address] [place] [address] [phone]       Follow up with [name], MD.   Specialty:  Internal Medicine   Contact information:   [address] [place] [address] [phone]        The results of significant diagnostics from this hospitalization (including imaging, microbiology, ancillary and laboratory) are listed below for reference.     Microbiology: Recent Results (from the past 240 hour(s))  Urine culture     Status: None (Preliminary result)   Collection Time: 05/21/14  2:36 PM  Result Value Ref Range Status    Specimen Description URINE, CLEAN CATCH  Final   Special Requests NONE  Final   Colony Count   Final    >=100,000 COLONIES/ML Performed at [facility]    Culture   Final    [place] Performed at [facility]    Report Status PENDING  Incomplete  Culture, blood (x 2)     Status: None (Preliminary result)   Collection Time: 05/21/14  4:12 PM  Result Value Ref Range Status   Specimen Description BLOOD RIGHT ANTECUBITAL  Final   Special Requests BOTTLES DRAWN AEROBIC AND ANAEROBIC 3ML  Final   Culture   Final           BLOOD CULTURE RECEIVED NO GROWTH TO DATE CULTURE WILL BE HELD FOR 5 DAYS BEFORE ISSUING A FINAL NEGATIVE REPORT Performed at [facility]    Report Status PENDING  Incomplete  Culture, blood (x 2)     Status: None (Preliminary result)   Collection Time: 05/21/14  4:13 PM  Result Value Ref Range Status   Specimen Description BLOOD LAC  Final   Special Requests BOTTLES DRAWN AEROBIC AND ANAEROBIC 5ML  Final   Culture   Final           BLOOD CULTURE RECEIVED NO GROWTH TO DATE CULTURE WILL BE HELD FOR 5 DAYS BEFORE ISSUING A FINAL NEGATIVE REPORT Performed at [facility]    Report Status PENDING  Incomplete     Labs: Basic Metabolic Panel:  Recent Labs Lab 05/21/14 1425 05/22/14 0507 05/23/14 0515 05/24/14 0540  NA 142 138 138 139  K 3.9 3.3* 3.8 4.1  CL 103 105 103 104  CO2 '26 24 26 26  ' GLUCOSE 126* 124* 107* 114*  BUN '19 23 20 22  ' CREATININE 1.16 1.09 1.19 1.00  CALCIUM 9.7 8.8 8.8 8.7   CBC:  Recent Labs Lab 05/21/14 1425 05/22/14 0507 05/23/14 0515 05/24/14 0540  WBC 21.8* 17.9* 12.4* 11.6*  NEUTROABS 19.0*  --   --   --   HGB 15.6 13.5 14.1 13.9  HCT 47.5 42.2 43.5 43.1  MCV 90.0 90.8 91.2 90.4  PLT 187 158 154 172    SIGNED: Time coordinating discharge: Over 30 minutes  Faye Ramsay, MD  [facility] 05/24/2014, 12:24 PM Pager [phone]  If 7PM-7AM, please contact  night-coverage [email or web address] Password [email or web address]

## 2014-05-24 NOTE — Progress Notes (Signed)
Rocephin 1 Gram given now per Dr. Doyle Askew orders,prior to patient's discharge. No c/o pain or discomfort noted. Will continue to monitor patient.

## 2014-05-27 DIAGNOSIS — R05 Cough: Secondary | ICD-10-CM | POA: Diagnosis not present

## 2014-05-27 DIAGNOSIS — N390 Urinary tract infection, site not specified: Secondary | ICD-10-CM | POA: Diagnosis not present

## 2014-05-27 DIAGNOSIS — I10 Essential (primary) hypertension: Secondary | ICD-10-CM | POA: Diagnosis not present

## 2014-05-27 DIAGNOSIS — Z8551 Personal history of malignant neoplasm of bladder: Secondary | ICD-10-CM | POA: Diagnosis not present

## 2014-05-27 DIAGNOSIS — E785 Hyperlipidemia, unspecified: Secondary | ICD-10-CM | POA: Diagnosis not present

## 2014-05-27 DIAGNOSIS — F0390 Unspecified dementia without behavioral disturbance: Secondary | ICD-10-CM | POA: Diagnosis not present

## 2014-05-27 DIAGNOSIS — Z85118 Personal history of other malignant neoplasm of bronchus and lung: Secondary | ICD-10-CM | POA: Diagnosis not present

## 2014-05-27 DIAGNOSIS — R8299 Other abnormal findings in urine: Secondary | ICD-10-CM | POA: Diagnosis not present

## 2014-05-27 DIAGNOSIS — Z87891 Personal history of nicotine dependence: Secondary | ICD-10-CM | POA: Diagnosis not present

## 2014-05-27 DIAGNOSIS — I482 Chronic atrial fibrillation: Secondary | ICD-10-CM | POA: Diagnosis not present

## 2014-05-27 DIAGNOSIS — Z9181 History of falling: Secondary | ICD-10-CM | POA: Diagnosis not present

## 2014-05-27 DIAGNOSIS — R109 Unspecified abdominal pain: Secondary | ICD-10-CM | POA: Diagnosis not present

## 2014-05-28 ENCOUNTER — Encounter (HOSPITAL_COMMUNITY): Payer: Self-pay | Admitting: Emergency Medicine

## 2014-05-28 DIAGNOSIS — N390 Urinary tract infection, site not specified: Secondary | ICD-10-CM | POA: Diagnosis not present

## 2014-05-28 DIAGNOSIS — F0390 Unspecified dementia without behavioral disturbance: Secondary | ICD-10-CM | POA: Diagnosis not present

## 2014-05-28 DIAGNOSIS — E785 Hyperlipidemia, unspecified: Secondary | ICD-10-CM | POA: Diagnosis not present

## 2014-05-28 DIAGNOSIS — I482 Chronic atrial fibrillation: Secondary | ICD-10-CM | POA: Diagnosis not present

## 2014-05-28 DIAGNOSIS — I10 Essential (primary) hypertension: Secondary | ICD-10-CM | POA: Diagnosis not present

## 2014-05-28 DIAGNOSIS — Z9181 History of falling: Secondary | ICD-10-CM | POA: Diagnosis not present

## 2014-05-28 LAB — CULTURE, BLOOD (ROUTINE X 2)
CULTURE: NO GROWTH
Culture: NO GROWTH

## 2014-05-29 ENCOUNTER — Other Ambulatory Visit: Payer: Self-pay | Admitting: *Deleted

## 2014-05-30 DIAGNOSIS — F0390 Unspecified dementia without behavioral disturbance: Secondary | ICD-10-CM | POA: Diagnosis not present

## 2014-05-30 DIAGNOSIS — I10 Essential (primary) hypertension: Secondary | ICD-10-CM | POA: Diagnosis not present

## 2014-05-30 DIAGNOSIS — I482 Chronic atrial fibrillation: Secondary | ICD-10-CM | POA: Diagnosis not present

## 2014-05-30 DIAGNOSIS — E785 Hyperlipidemia, unspecified: Secondary | ICD-10-CM | POA: Diagnosis not present

## 2014-05-30 DIAGNOSIS — Z9181 History of falling: Secondary | ICD-10-CM | POA: Diagnosis not present

## 2014-05-30 DIAGNOSIS — N390 Urinary tract infection, site not specified: Secondary | ICD-10-CM | POA: Diagnosis not present

## 2014-06-02 DIAGNOSIS — I10 Essential (primary) hypertension: Secondary | ICD-10-CM | POA: Diagnosis not present

## 2014-06-02 DIAGNOSIS — E785 Hyperlipidemia, unspecified: Secondary | ICD-10-CM | POA: Diagnosis not present

## 2014-06-02 DIAGNOSIS — Z9181 History of falling: Secondary | ICD-10-CM | POA: Diagnosis not present

## 2014-06-02 DIAGNOSIS — F0390 Unspecified dementia without behavioral disturbance: Secondary | ICD-10-CM | POA: Diagnosis not present

## 2014-06-02 DIAGNOSIS — N390 Urinary tract infection, site not specified: Secondary | ICD-10-CM | POA: Diagnosis not present

## 2014-06-02 DIAGNOSIS — I482 Chronic atrial fibrillation: Secondary | ICD-10-CM | POA: Diagnosis not present

## 2014-06-03 DIAGNOSIS — E785 Hyperlipidemia, unspecified: Secondary | ICD-10-CM | POA: Diagnosis not present

## 2014-06-03 DIAGNOSIS — N1330 Unspecified hydronephrosis: Secondary | ICD-10-CM | POA: Diagnosis not present

## 2014-06-03 DIAGNOSIS — N390 Urinary tract infection, site not specified: Secondary | ICD-10-CM | POA: Diagnosis not present

## 2014-06-03 DIAGNOSIS — K909 Intestinal malabsorption, unspecified: Secondary | ICD-10-CM | POA: Diagnosis not present

## 2014-06-03 DIAGNOSIS — Z9181 History of falling: Secondary | ICD-10-CM | POA: Diagnosis not present

## 2014-06-03 DIAGNOSIS — I482 Chronic atrial fibrillation: Secondary | ICD-10-CM | POA: Diagnosis not present

## 2014-06-03 DIAGNOSIS — F0390 Unspecified dementia without behavioral disturbance: Secondary | ICD-10-CM | POA: Diagnosis not present

## 2014-06-03 DIAGNOSIS — I10 Essential (primary) hypertension: Secondary | ICD-10-CM | POA: Diagnosis not present

## 2014-06-04 DIAGNOSIS — Z9181 History of falling: Secondary | ICD-10-CM | POA: Diagnosis not present

## 2014-06-04 DIAGNOSIS — N390 Urinary tract infection, site not specified: Secondary | ICD-10-CM | POA: Diagnosis not present

## 2014-06-04 DIAGNOSIS — E785 Hyperlipidemia, unspecified: Secondary | ICD-10-CM | POA: Diagnosis not present

## 2014-06-04 DIAGNOSIS — F0390 Unspecified dementia without behavioral disturbance: Secondary | ICD-10-CM | POA: Diagnosis not present

## 2014-06-04 DIAGNOSIS — I482 Chronic atrial fibrillation: Secondary | ICD-10-CM | POA: Diagnosis not present

## 2014-06-04 DIAGNOSIS — I10 Essential (primary) hypertension: Secondary | ICD-10-CM | POA: Diagnosis not present

## 2014-06-05 ENCOUNTER — Other Ambulatory Visit: Payer: Self-pay | Admitting: *Deleted

## 2014-06-05 ENCOUNTER — Encounter: Payer: Self-pay | Admitting: *Deleted

## 2014-06-05 DIAGNOSIS — R296 Repeated falls: Secondary | ICD-10-CM

## 2014-06-05 NOTE — Patient Outreach (Signed)
[place] [facility]) Care Management  06/05/2014  HERSCHELL VIRANI Aug 12, 1930 [id number]  Subjective: Spoke with the patients Rozanna Boer- daughter Debara Pickett today, reports that Mr. Lauver is doing ok no further falls,  continues working with physical therapy and HHRN through [place]. [name]  provides 24 hour care for him voiced concern regarding resources available for someone to come and sit with her Grandfather 4 hours intermittently or assistance with arranging care for him if/when she goes out of town.  Plan : CSW consult to assist with available resources, scheduled home visit on April 27 at 10 am.  Joylene Draft, RN, [place] Care Management [phone]

## 2014-06-06 ENCOUNTER — Other Ambulatory Visit: Payer: Self-pay

## 2014-06-06 DIAGNOSIS — I10 Essential (primary) hypertension: Secondary | ICD-10-CM | POA: Diagnosis not present

## 2014-06-06 DIAGNOSIS — E785 Hyperlipidemia, unspecified: Secondary | ICD-10-CM | POA: Diagnosis not present

## 2014-06-06 DIAGNOSIS — N390 Urinary tract infection, site not specified: Secondary | ICD-10-CM | POA: Diagnosis not present

## 2014-06-06 DIAGNOSIS — I482 Chronic atrial fibrillation: Secondary | ICD-10-CM | POA: Diagnosis not present

## 2014-06-06 DIAGNOSIS — Z9181 History of falling: Secondary | ICD-10-CM | POA: Diagnosis not present

## 2014-06-06 DIAGNOSIS — F0390 Unspecified dementia without behavioral disturbance: Secondary | ICD-10-CM | POA: Diagnosis not present

## 2014-06-06 NOTE — Patient Outreach (Signed)
[place] [facility]) Care Management  06/06/2014  [name] 1930-10-16 [id number]  CSW attempted to make contact with patient's granddaughter/caregiver. CSW left a HIPPA compliant message requesting a call back.

## 2014-06-09 ENCOUNTER — Other Ambulatory Visit: Payer: Self-pay

## 2014-06-09 DIAGNOSIS — E785 Hyperlipidemia, unspecified: Secondary | ICD-10-CM | POA: Diagnosis not present

## 2014-06-09 DIAGNOSIS — F0390 Unspecified dementia without behavioral disturbance: Secondary | ICD-10-CM | POA: Diagnosis not present

## 2014-06-09 DIAGNOSIS — I10 Essential (primary) hypertension: Secondary | ICD-10-CM | POA: Diagnosis not present

## 2014-06-09 DIAGNOSIS — N390 Urinary tract infection, site not specified: Secondary | ICD-10-CM | POA: Diagnosis not present

## 2014-06-09 DIAGNOSIS — I482 Chronic atrial fibrillation: Secondary | ICD-10-CM | POA: Diagnosis not present

## 2014-06-09 DIAGNOSIS — Z9181 History of falling: Secondary | ICD-10-CM | POA: Diagnosis not present

## 2014-06-09 NOTE — Patient Outreach (Signed)
[place] [facility]) Care Management  06/09/2014  [name] [birth year] [id number]   CSW called and spoke with granddaughter/caregiver. She reports she handles and meets most of patient's needs but needs respite for vacations and for trips out. She denied the need for [facility] or [facility] as she believes patient will not leave home. CSW reccommended regional association on aging adults to provide sitter resources. CSW also made referral to regional consolidated services for respite services. CSW made appointment to call back in two weeks to assess progress on goals.

## 2014-06-11 DIAGNOSIS — Z8551 Personal history of malignant neoplasm of bladder: Secondary | ICD-10-CM | POA: Diagnosis not present

## 2014-06-11 DIAGNOSIS — C61 Malignant neoplasm of prostate: Secondary | ICD-10-CM | POA: Diagnosis not present

## 2014-06-11 DIAGNOSIS — N390 Urinary tract infection, site not specified: Secondary | ICD-10-CM | POA: Diagnosis not present

## 2014-06-11 DIAGNOSIS — I10 Essential (primary) hypertension: Secondary | ICD-10-CM | POA: Diagnosis not present

## 2014-06-11 DIAGNOSIS — E785 Hyperlipidemia, unspecified: Secondary | ICD-10-CM | POA: Diagnosis not present

## 2014-06-11 DIAGNOSIS — N1330 Unspecified hydronephrosis: Secondary | ICD-10-CM | POA: Diagnosis not present

## 2014-06-11 DIAGNOSIS — I482 Chronic atrial fibrillation: Secondary | ICD-10-CM | POA: Diagnosis not present

## 2014-06-11 DIAGNOSIS — Z9181 History of falling: Secondary | ICD-10-CM | POA: Diagnosis not present

## 2014-06-11 DIAGNOSIS — F0390 Unspecified dementia without behavioral disturbance: Secondary | ICD-10-CM | POA: Diagnosis not present

## 2014-06-11 DIAGNOSIS — N261 Atrophy of kidney (terminal): Secondary | ICD-10-CM | POA: Diagnosis not present

## 2014-06-13 DIAGNOSIS — Z8551 Personal history of malignant neoplasm of bladder: Secondary | ICD-10-CM | POA: Diagnosis not present

## 2014-06-13 DIAGNOSIS — Q631 Lobulated, fused and horseshoe kidney: Secondary | ICD-10-CM | POA: Diagnosis not present

## 2014-06-13 DIAGNOSIS — K5732 Diverticulitis of large intestine without perforation or abscess without bleeding: Secondary | ICD-10-CM | POA: Diagnosis not present

## 2014-06-13 DIAGNOSIS — K8020 Calculus of gallbladder without cholecystitis without obstruction: Secondary | ICD-10-CM | POA: Diagnosis not present

## 2014-06-13 DIAGNOSIS — N1330 Unspecified hydronephrosis: Secondary | ICD-10-CM | POA: Diagnosis not present

## 2014-06-16 DIAGNOSIS — Z9181 History of falling: Secondary | ICD-10-CM | POA: Diagnosis not present

## 2014-06-16 DIAGNOSIS — E785 Hyperlipidemia, unspecified: Secondary | ICD-10-CM | POA: Diagnosis not present

## 2014-06-16 DIAGNOSIS — I10 Essential (primary) hypertension: Secondary | ICD-10-CM | POA: Diagnosis not present

## 2014-06-16 DIAGNOSIS — N390 Urinary tract infection, site not specified: Secondary | ICD-10-CM | POA: Diagnosis not present

## 2014-06-16 DIAGNOSIS — F0390 Unspecified dementia without behavioral disturbance: Secondary | ICD-10-CM | POA: Diagnosis not present

## 2014-06-16 DIAGNOSIS — I482 Chronic atrial fibrillation: Secondary | ICD-10-CM | POA: Diagnosis not present

## 2014-06-17 ENCOUNTER — Encounter: Payer: Self-pay | Admitting: Gastroenterology

## 2014-06-18 ENCOUNTER — Encounter: Payer: Self-pay | Admitting: *Deleted

## 2014-06-18 ENCOUNTER — Other Ambulatory Visit: Payer: Self-pay | Admitting: *Deleted

## 2014-06-18 VITALS — BP 128/70 | HR 68 | Resp 18

## 2014-06-18 DIAGNOSIS — F0390 Unspecified dementia without behavioral disturbance: Secondary | ICD-10-CM | POA: Diagnosis not present

## 2014-06-18 DIAGNOSIS — E785 Hyperlipidemia, unspecified: Secondary | ICD-10-CM | POA: Diagnosis not present

## 2014-06-18 DIAGNOSIS — I10 Essential (primary) hypertension: Secondary | ICD-10-CM | POA: Diagnosis not present

## 2014-06-18 DIAGNOSIS — Z9181 History of falling: Secondary | ICD-10-CM | POA: Diagnosis not present

## 2014-06-18 DIAGNOSIS — R296 Repeated falls: Secondary | ICD-10-CM

## 2014-06-18 DIAGNOSIS — I482 Chronic atrial fibrillation: Secondary | ICD-10-CM | POA: Diagnosis not present

## 2014-06-18 DIAGNOSIS — N390 Urinary tract infection, site not specified: Secondary | ICD-10-CM | POA: Diagnosis not present

## 2014-06-18 NOTE — Patient Outreach (Signed)
[place] [facility]) Care Management   06/18/2014  [name] January 06, 1931 [id number]  [name] is an 81 y.o. male  Subjective: "I'm having a pretty good day"  Objective:   Review of Systems  Respiratory: Negative for cough and shortness of breath.   Cardiovascular: Negative for chest pain.  Gastrointestinal: Negative for abdominal pain.    Physical Exam  Constitutional: Vital signs are normal. He appears well-nourished. He is cooperative.  Cardiovascular: Normal rate and regular rhythm.   Respiratory: Breath sounds normal.  GI: Normal appearance and bowel sounds are normal.  Neurological: He is alert.  Skin: Skin is warm and dry.  Psychiatric: He has a normal mood and affect. Cognition and memory are impaired.   BP 128/70 mmHg  Pulse 68  Resp 18  SpO2 95%   Current Medications:   Current Outpatient Prescriptions  Medication Sig Dispense Refill  . ALPHA LIPOIC ACID PO Take 200-300 mg by mouth daily.    Marland Kitchen ascorbic acid (VITAMIN C) 1000 MG tablet Take 1,000 mg by mouth daily.    Marland Kitchen atorvastatin (LIPITOR) 10 MG tablet Take 10 mg by mouth daily.    . calcium carbonate (OS-CAL - DOSED IN MG OF ELEMENTAL CALCIUM) 1250 MG tablet Take 1 tablet by mouth daily.    . cholecalciferol (VITAMIN D) 1000 UNITS tablet Take 1,000 Units by mouth daily.    . ciprofloxacin (CIPRO) 500 MG tablet Take 500 mg by mouth 2 (two) times daily.    . Cyanocobalamin (VITAMIN B 12 PO) Take 500 mg by mouth daily.     Marland Kitchen esomeprazole (NEXIUM) 40 MG capsule Take 40 mg by mouth daily as needed. For acid reflux    . hydrocortisone valerate cream (WESTCORT) 0.2 % Apply 1 application topically 2 (two) times daily as needed.     Marland Kitchen levofloxacin (LEVAQUIN) 500 MG tablet Take 1 tablet (500 mg total) by mouth daily. 5 tablet 0  . MELATONIN PO Take 1 tablet by mouth at bedtime as needed (sleep).    . metoprolol succinate (TOPROL-XL) 50 MG 24 hr tablet Take 50 mg by mouth daily. Take with or  immediately following a meal.    . metroNIDAZOLE (FLAGYL) 500 MG tablet Take 500 mg by mouth 3 (three) times daily. Every 8 hours    . nitrofurantoin (MACRODANTIN) 100 MG capsule Take 100 mg by mouth daily.    . sertraline (ZOLOFT) 100 MG tablet Take 100 mg by mouth daily.    Marland Kitchen atorvastatin (LIPITOR) 10 MG tablet Take 10 mg by mouth daily.    . cephALEXin (KEFLEX) 500 MG capsule Take 500 mg by mouth 2 (two) times daily.    Marland Kitchen HYDROcodone-acetaminophen (NORCO/VICODIN) 5-325 MG per tablet Take 1 tablet by mouth every 6 (six) hours as needed for moderate pain. (Patient not taking: Reported on 06/18/2014) 20 tablet 0  . HYDROcodone-acetaminophen (NORCO/VICODIN) 5-325 MG per tablet Take 1 tablet by mouth every 6 (six) hours as needed. (Patient not taking: Reported on 06/18/2014) 20 tablet 0  . metoprolol succinate (TOPROL-XL) 50 MG 24 hr tablet Take 50 mg by mouth daily. Take with or immediately following a meal.    . sertraline (ZOLOFT) 100 MG tablet Take 100 mg by mouth daily.    Marland Kitchen warfarin (COUMADIN) 2.5 MG tablet Take 2.5 mg by mouth daily.     No current facility-administered medications for this visit.    Functional Status:   In your present state of health, do you have any difficulty  performing the following activities: 06/18/2014 06/18/2014  Hearing? Y (No Data)  Vision? - Y  Difficulty concentrating or making decisions? - Y  Walking or climbing stairs? (No Data) Y  Dressing or bathing? - N  Doing errands, shopping? - Y  Preparing Food and eating ? - Y  Using the Toilet? - N  In the past six months, have you accidently leaked urine? - Y  Do you have problems with loss of bowel control? - N  Managing your Medications? - Y  Managing your Finances? - Y  Housekeeping or managing your Housekeeping? - Y    Fall/Depression Screening:    PHQ 2/9 Scores 06/18/2014 06/09/2014  PHQ - 2 Score 0 -  Exception Documentation - Patient refusal    Assessment:  Patient resting in recliner chair on  arrival, [place] daughter Lincoln Brigham that provides 24  hour care   is present during visit . No reported recent falls. Pt still followed by home health Physical Therapy. Patient looking forward to hopefully being able to move into a basement  Apartment being renovated at his  Granddaughters in a couple of weeks. Patient continues on po antibiotics for recent diagnosis of UTI and new diagnosis of diverticulitis per  Granddaughter report. Reviewed education on signs and symptoms of early recognition of UTI.  Lindsay's biggest  concern today is getting assistance, with someone to stay with her Grandfather a few hours a day , if she needs to go out, reports that she has long term care insurance that she can use.  Plan: Will follow up with CSW regarding assistance with direction and available resources and communicate information to [place] by telephone call within 2 weeks. Follow up home visit scheduled for 07/16/14 1000.  Joylene Draft, RN, [place] Care Management [phone]  [facility] CM Care Plan        Patient Outreach from 06/18/2014 in [place] Problem Two  Risk for [place] for Problem Two  Active   Interventions for Problem Two Long Term Goal   Reviewed fall prevention measures   THN Long Term Goal (31-90) days  Patient will be able to report no falls with  for next 31 days   [facility]N Long Term Goal Start Date  06/18/14   [facility] CM Short Term Goal #1 (0-30 days)  Patient will utiilize walker during ambulation at all  times   THN CM Short Term Goal #1 Start Date  06/18/14   [facility] CM Short Term Goal #2 (0-30 days)  Patient will be able to report a walking routine in home at least 3 days per week for the next 30 days    [facility]N CM Short Term Goal #2 Start Date  06/18/14

## 2014-06-19 DIAGNOSIS — F0390 Unspecified dementia without behavioral disturbance: Secondary | ICD-10-CM | POA: Diagnosis not present

## 2014-06-19 DIAGNOSIS — I10 Essential (primary) hypertension: Secondary | ICD-10-CM | POA: Diagnosis not present

## 2014-06-19 DIAGNOSIS — I482 Chronic atrial fibrillation: Secondary | ICD-10-CM | POA: Diagnosis not present

## 2014-06-19 DIAGNOSIS — Z9181 History of falling: Secondary | ICD-10-CM | POA: Diagnosis not present

## 2014-06-19 DIAGNOSIS — E785 Hyperlipidemia, unspecified: Secondary | ICD-10-CM | POA: Diagnosis not present

## 2014-06-19 DIAGNOSIS — N390 Urinary tract infection, site not specified: Secondary | ICD-10-CM | POA: Diagnosis not present

## 2014-06-23 ENCOUNTER — Ambulatory Visit: Payer: Medicare Other

## 2014-06-23 DIAGNOSIS — W19XXXA Unspecified fall, initial encounter: Secondary | ICD-10-CM | POA: Diagnosis not present

## 2014-06-23 DIAGNOSIS — N390 Urinary tract infection, site not specified: Secondary | ICD-10-CM | POA: Diagnosis not present

## 2014-06-23 DIAGNOSIS — M25552 Pain in left hip: Secondary | ICD-10-CM | POA: Diagnosis not present

## 2014-06-23 DIAGNOSIS — Z6827 Body mass index (BMI) 27.0-27.9, adult: Secondary | ICD-10-CM | POA: Diagnosis not present

## 2014-06-24 DIAGNOSIS — I482 Chronic atrial fibrillation: Secondary | ICD-10-CM | POA: Diagnosis not present

## 2014-06-24 DIAGNOSIS — I10 Essential (primary) hypertension: Secondary | ICD-10-CM | POA: Diagnosis not present

## 2014-06-24 DIAGNOSIS — E785 Hyperlipidemia, unspecified: Secondary | ICD-10-CM | POA: Diagnosis not present

## 2014-06-24 DIAGNOSIS — Z9181 History of falling: Secondary | ICD-10-CM | POA: Diagnosis not present

## 2014-06-24 DIAGNOSIS — F0390 Unspecified dementia without behavioral disturbance: Secondary | ICD-10-CM | POA: Diagnosis not present

## 2014-06-24 DIAGNOSIS — N390 Urinary tract infection, site not specified: Secondary | ICD-10-CM | POA: Diagnosis not present

## 2014-06-25 DIAGNOSIS — N390 Urinary tract infection, site not specified: Secondary | ICD-10-CM | POA: Diagnosis not present

## 2014-06-25 DIAGNOSIS — K5732 Diverticulitis of large intestine without perforation or abscess without bleeding: Secondary | ICD-10-CM | POA: Diagnosis not present

## 2014-06-25 DIAGNOSIS — Z8551 Personal history of malignant neoplasm of bladder: Secondary | ICD-10-CM | POA: Diagnosis not present

## 2014-06-25 DIAGNOSIS — N1330 Unspecified hydronephrosis: Secondary | ICD-10-CM | POA: Diagnosis not present

## 2014-06-25 DIAGNOSIS — Q631 Lobulated, fused and horseshoe kidney: Secondary | ICD-10-CM | POA: Diagnosis not present

## 2014-06-26 DIAGNOSIS — F0390 Unspecified dementia without behavioral disturbance: Secondary | ICD-10-CM | POA: Diagnosis not present

## 2014-06-26 DIAGNOSIS — Z9181 History of falling: Secondary | ICD-10-CM | POA: Diagnosis not present

## 2014-06-26 DIAGNOSIS — N390 Urinary tract infection, site not specified: Secondary | ICD-10-CM | POA: Diagnosis not present

## 2014-06-26 DIAGNOSIS — I10 Essential (primary) hypertension: Secondary | ICD-10-CM | POA: Diagnosis not present

## 2014-06-26 DIAGNOSIS — E785 Hyperlipidemia, unspecified: Secondary | ICD-10-CM | POA: Diagnosis not present

## 2014-06-26 DIAGNOSIS — I482 Chronic atrial fibrillation: Secondary | ICD-10-CM | POA: Diagnosis not present

## 2014-07-03 ENCOUNTER — Other Ambulatory Visit: Payer: Self-pay | Admitting: *Deleted

## 2014-07-03 NOTE — Patient Outreach (Signed)
[place] [facility]) Care Management  07/03/2014  CLOVIS MANKINS Sep 23, 1930 [id number]   Phone call to patient's grand daughter, [name] who is currently looking for personal care services for patient.  Per [name], patient has been living with her for the past 2 months.  They are working on converting the basement into patient's own living space.  However, per granddaugter, she is uncomfortable leaving patient alone.  Per granddaughter, patient has a long term care policy that can be used for in home personal care assistance.  Resources  provided to granddaughter that offer home health/custodial care.  Home Care Providers [phone], [place], [phone], [facility], [phone], [facility] [phone].   This Education officer, museum will follow up with granddaughter in approximately 2 weeks to follow up on referrals provided and to assess for continued social work needs.    Sheralyn Boatman [facility] Care Management [phone]

## 2014-07-11 ENCOUNTER — Other Ambulatory Visit: Payer: Self-pay | Admitting: *Deleted

## 2014-07-11 ENCOUNTER — Encounter: Payer: Self-pay | Admitting: *Deleted

## 2014-07-11 NOTE — Patient Outreach (Signed)
[facility]) Care Management   07/11/2014  [name] 02/02/1931 [id number]  [name] is an 81 y.o. male  Subjective: I'm doing ok this morning"  Objective:   Review of Systems  Constitutional: Negative.   HENT: Negative.   Eyes: Negative.   Respiratory: Negative.   Cardiovascular: Negative.   Gastrointestinal: Negative.   Genitourinary: Negative.   Musculoskeletal: Negative.   Neurological: Negative.   Psychiatric/Behavioral: Negative.     Physical Exam  Constitutional: He is oriented to person, place, and time. Vital signs are normal. He appears well-developed.  HENT:  Hard of hearing left ear  Cardiovascular: Normal rate and regular rhythm.   Respiratory: Effort normal and breath sounds normal.  GI: Soft.  Genitourinary:  Self urinary cath am and hs, wears depends   Musculoskeletal:  Reported frequent falls  Neurological: He is alert and oriented to person, place, and time.  Skin: Skin is warm and dry.  Psychiatric: He has a normal mood and affect.   BP 106/70 mmHg  Pulse 73  Resp 20  SpO2 95%  Current Medications:   Current Outpatient Prescriptions  Medication Sig Dispense Refill  . ALPHA LIPOIC ACID PO Take 200-300 mg by mouth daily.    Marland Kitchen ascorbic acid (VITAMIN C) 1000 MG tablet Take 1,000 mg by mouth daily.    Marland Kitchen atorvastatin (LIPITOR) 10 MG tablet Take 10 mg by mouth daily.    . calcium carbonate (OS-CAL - DOSED IN MG OF ELEMENTAL CALCIUM) 1250 MG tablet Take 1 tablet by mouth daily.    . cholecalciferol (VITAMIN D) 1000 UNITS tablet Take 1,000 Units by mouth daily.    . Cyanocobalamin (VITAMIN B 12 PO) Take 500 mg by mouth daily.     Marland Kitchen esomeprazole (NEXIUM) 40 MG capsule Take 40 mg by mouth daily as needed. For acid reflux    . hydrocortisone valerate cream (WESTCORT) 0.2 % Apply 1 application topically 2 (two) times daily as needed.     Marland Kitchen levofloxacin (LEVAQUIN) 500 MG tablet Take 1 tablet (500 mg total) by mouth  daily. 5 tablet 0  . MELATONIN PO Take 1 tablet by mouth at bedtime as needed (sleep).    . metoprolol succinate (TOPROL-XL) 50 MG 24 hr tablet Take 50 mg by mouth daily. Take with or immediately following a meal.    . nitrofurantoin (MACRODANTIN) 100 MG capsule Take 100 mg by mouth daily.    . sertraline (ZOLOFT) 100 MG tablet Take 100 mg by mouth daily.    Marland Kitchen atorvastatin (LIPITOR) 10 MG tablet Take 10 mg by mouth daily.    . cephALEXin (KEFLEX) 500 MG capsule Take 500 mg by mouth 2 (two) times daily.    . ciprofloxacin (CIPRO) 500 MG tablet Take 500 mg by mouth 2 (two) times daily.    Marland Kitchen HYDROcodone-acetaminophen (NORCO/VICODIN) 5-325 MG per tablet Take 1 tablet by mouth every 6 (six) hours as needed for moderate pain. (Patient not taking: Reported on 06/18/2014) 20 tablet 0  . HYDROcodone-acetaminophen (NORCO/VICODIN) 5-325 MG per tablet Take 1 tablet by mouth every 6 (six) hours as needed. (Patient not taking: Reported on 06/18/2014) 20 tablet 0  . metoprolol succinate (TOPROL-XL) 50 MG 24 hr tablet Take 50 mg by mouth daily. Take with or immediately following a meal.    . metroNIDAZOLE (FLAGYL) 500 MG tablet Take 500 mg by mouth 3 (three) times daily. Every 8 hours    . sertraline (ZOLOFT) 100 MG tablet Take 100 mg by  mouth daily.    Marland Kitchen warfarin (COUMADIN) 2.5 MG tablet Take 2.5 mg by mouth daily.     No current facility-administered medications for this visit.    Functional Status:   In your present state of health, do you have any difficulty performing the following activities: 06/18/2014 06/18/2014  Hearing? Y (No Data)  Vision? - Y  Difficulty concentrating or making decisions? - Y  Walking or climbing stairs? (No Data) Y  Dressing or bathing? - N  Doing errands, shopping? - Y  Preparing Food and eating ? - Y  Using the Toilet? - N  In the past six months, have you accidently leaked urine? - Y  Do you have problems with loss of bowel control? - N  Managing your Medications? - Y   Managing your Finances? - Y  Housekeeping or managing your Housekeeping? - Y    Fall/Depression Screening:    PHQ 2/9 Scores 06/18/2014 06/09/2014  PHQ - 2 Score 0 -  Exception Documentation - Patient refusal    Assessment: Routine home visit, patient resting in recliner chair. Alert and oriented,denies pain.[name]  [facility]- Daughter) reports that [name] continues to have frequent falls, without injury "I just lose my balance, I usually land on my bottom", denies dizziness. At times they report the falls occur even when the patient has his walker, and other times he has left his walker outside the bathroom,and has fallen in the bathroom. Reinforced  with the patient and Grand daughter importance of patient using his walker at all times, inform MD if patient continues with frequent falls. Home Health Physical therapy has completed their course of therapy with the patient. Patient admits that he does not complete his  3 times a week walking routine, just walks to bathroom and dinner table a few times a day otherwise he sits in his recliner chair. Discussed with them importance of activity and that patient may need standby assistance with walking routine.  Sitting and standing vitals obtained during home visit: Sitting : 110/72 HR 74, Standing : 106/70 HR 73.  [name] plans to move from upstairs at his [place] daughters home into a newly renovated basement living area at the home. Dennis Ryder invited me to tour the basement area and states Occupational therapy has reviewed the area.Patient is excited about his new living space and hopes to be able to move into the area soon.   Plan: Will communicate with Dr. Raul Del office that DennisHall continues to have frequent falls, no injury. Routine RNCM  home visit scheduled for June, 16 at 10:00 am. I was able to leave a telephone  message with Dr. Raul Del assistant, and will forward today's home visit notes to office.  [facility] CM Care Plan  Problem One        Patient Outreach Telephone from 06/09/2014 in [place] Problem One  Respite Care for patient    Care Plan for Problem One  Active   THN Long Term Goal (31-90 days)  Patient will have respite care set-up within 31 days    THN Long Term Goal Start Date  06/09/14   Interventions for Problem One Long Term Goal  Caregiver will look into regional aging adults association for resources   [facility] CM Short Term Goal #1 (0-30 days)  Patient will contact regional aging adult association within 2 weeks    THN CM Short Term Goal #1 Start Date  06/09/14   Interventions for Short Term Goal #  1  CSW provided caregiver with contact number    THN CM Short Term Goal #2 (0-30 days)  Patient will contact regional consolidated services for assistance within 2 weeks    THN CM Short Term Goal #2 Start Date  06/09/14   Interventions for Short Term Goal #2  CSW made referral for regional consolidated services     [facility] CM Care Plan Problem Two        Patient Outreach from 06/18/2014 in [place] Problem Two  Risk for [place] for Problem Two  Active   Interventions for Problem Two Long Term Goal   Reviewed fall prevention measures   THN Long Term Goal (31-90) days  Patient will be able to report no falls with  for next 31 days   THN Long Term Goal Start Date  06/18/14   THN CM Short Term Goal #1 (0-30 days)  Patient will utiilize walker during ambulation at all  times   THN CM Short Term Goal #1 Start Date  06/18/14   Interventions for Short Term Goal #2   Discussed with the patient and grand daughter importance of using all safety equipment , rolling walker . Encouraged patient to use BSC instead of walking distance to bathroom for fall prevention    THN CM Short Term Goal #2 (0-30 days)  Patient will be able to report a walking routine in home at least 3 days per week for the next 30 days    THN CM Short Term Goal #2 Start Date  06/18/14    Interventions for Short Term Goal #2  Discussed importance of being active to increase strength in legs      Joylene Draft, [place], [place] Management [phone]

## 2014-07-16 ENCOUNTER — Ambulatory Visit: Payer: Medicare Other | Admitting: *Deleted

## 2014-07-23 DIAGNOSIS — N390 Urinary tract infection, site not specified: Secondary | ICD-10-CM | POA: Diagnosis not present

## 2014-07-23 DIAGNOSIS — N1330 Unspecified hydronephrosis: Secondary | ICD-10-CM | POA: Diagnosis not present

## 2014-07-24 ENCOUNTER — Other Ambulatory Visit: Payer: Self-pay | Admitting: *Deleted

## 2014-07-24 NOTE — Patient Outreach (Signed)
[facility]) Care Management  07/24/2014  [name] 07-19-1930 [id number]   Phone call to patietmt's grand daughter to follow up on arrangements made for custodial care for patient.  Per patient's grand daughter, she has moved patient moved patient to his new living space in the newly renovated basement, however has not yet contacted referrals previously provided to her for custodial care.  Per granddaughter she will make contact by early next week.  This Education officer, museum will follow up with granddaughter within 2 weeks to follow up on referrals for referral care.   [name] [facility] Care Management [phone]

## 2014-07-25 ENCOUNTER — Encounter: Payer: Self-pay | Admitting: *Deleted

## 2014-07-30 DIAGNOSIS — R296 Repeated falls: Secondary | ICD-10-CM | POA: Diagnosis not present

## 2014-07-30 DIAGNOSIS — N390 Urinary tract infection, site not specified: Secondary | ICD-10-CM | POA: Diagnosis not present

## 2014-07-30 DIAGNOSIS — Z6828 Body mass index (BMI) 28.0-28.9, adult: Secondary | ICD-10-CM | POA: Diagnosis not present

## 2014-07-30 DIAGNOSIS — I480 Paroxysmal atrial fibrillation: Secondary | ICD-10-CM | POA: Diagnosis not present

## 2014-07-30 DIAGNOSIS — F0390 Unspecified dementia without behavioral disturbance: Secondary | ICD-10-CM | POA: Diagnosis not present

## 2014-08-07 ENCOUNTER — Ambulatory Visit: Payer: Medicare Other | Admitting: *Deleted

## 2014-08-07 ENCOUNTER — Other Ambulatory Visit: Payer: Self-pay | Admitting: *Deleted

## 2014-08-07 NOTE — Patient Outreach (Signed)
[facility]) Care Management  08/07/2014  SHAURYA RAWDON [birth year] [id number]   Routine scheduled home visit for today at 47 am, Grand daughter [name], [facility]) called to say that Mr. Kalafut was still sleeping and that she did not want to disturb, and requested that I reschedule appointment. Plan home visit on Friday, June 17 at 10 am  Joylene Draft, [place], [place] Care Management [phone]

## 2014-08-08 ENCOUNTER — Other Ambulatory Visit: Payer: Self-pay | Admitting: *Deleted

## 2014-08-08 NOTE — Patient Outreach (Signed)
[facility] [facility]) Care Management   08/08/2014  KHRIZ LIDDY 04-24-1930 [id number]  JEVONTE CLANTON is an 81 y.o. male  Subjective: " I'm doing just fine"   Objective:   Review of Systems  Constitutional: Negative.   HENT: Negative.   Respiratory: Negative.   Cardiovascular: Negative.   Gastrointestinal: Negative.   Genitourinary: Negative.   Musculoskeletal: Positive for falls.  Skin: Negative.   Psychiatric/Behavioral: Positive for memory loss.    Physical Exam  Constitutional: He is oriented to person, place, and time. Vital signs are normal. He appears well-developed and well-nourished.  Cardiovascular: Normal rate.   Respiratory: Effort normal and breath sounds normal.  GI: Soft.  Neurological: He is alert and oriented to person, place, and time.  Skin: Skin is warm and dry.  Healing bruised area under right upper arm area, and left knee area, skin intact  Psychiatric: He has a normal mood and affect. Cognition and memory are impaired. He exhibits abnormal recent memory.   BP 110/70 mmHg  Pulse 64  Resp 20  SpO2 98%   Current Medications:   Current Outpatient Prescriptions  Medication Sig Dispense Refill  . ALPHA LIPOIC ACID PO Take 200-300 mg by mouth daily.    Marland Kitchen ascorbic acid (VITAMIN C) 1000 MG tablet Take 1,000 mg by mouth daily.    Marland Kitchen atorvastatin (LIPITOR) 10 MG tablet Take 10 mg by mouth daily.    . calcium carbonate (OS-CAL - DOSED IN MG OF ELEMENTAL CALCIUM) 1250 MG tablet Take 1 tablet by mouth daily.    . cholecalciferol (VITAMIN D) 1000 UNITS tablet Take 1,000 Units by mouth daily.    . Cyanocobalamin (VITAMIN B 12 PO) Take 500 mg by mouth daily.     Marland Kitchen esomeprazole (NEXIUM) 40 MG capsule Take 40 mg by mouth daily as needed. For acid reflux    . hydrocortisone valerate cream (WESTCORT) 0.2 % Apply 1 application topically 2 (two) times daily as needed.     Marland Kitchen MELATONIN PO Take 1 tablet by mouth at bedtime as needed (sleep).    .  metoprolol succinate (TOPROL-XL) 50 MG 24 hr tablet Take 50 mg by mouth daily. Take with or immediately following a meal.    . nitrofurantoin (MACRODANTIN) 100 MG capsule Take 100 mg by mouth daily.    . sertraline (ZOLOFT) 100 MG tablet Take 100 mg by mouth daily.    Marland Kitchen atorvastatin (LIPITOR) 10 MG tablet Take 10 mg by mouth daily.    . cephALEXin (KEFLEX) 500 MG capsule Take 500 mg by mouth 2 (two) times daily.    . ciprofloxacin (CIPRO) 500 MG tablet Take 500 mg by mouth 2 (two) times daily.    Marland Kitchen HYDROcodone-acetaminophen (NORCO/VICODIN) 5-325 MG per tablet Take 1 tablet by mouth every 6 (six) hours as needed for moderate pain. (Patient not taking: Reported on 06/18/2014) 20 tablet 0  . HYDROcodone-acetaminophen (NORCO/VICODIN) 5-325 MG per tablet Take 1 tablet by mouth every 6 (six) hours as needed. (Patient not taking: Reported on 06/18/2014) 20 tablet 0  . levofloxacin (LEVAQUIN) 500 MG tablet Take 1 tablet (500 mg total) by mouth daily. (Patient not taking: Reported on 08/08/2014) 5 tablet 0  . metoprolol succinate (TOPROL-XL) 50 MG 24 hr tablet Take 50 mg by mouth daily. Take with or immediately following a meal.    . metroNIDAZOLE (FLAGYL) 500 MG tablet Take 500 mg by mouth 3 (three) times daily. Every 8 hours    . sertraline (ZOLOFT) 100 MG  tablet Take 100 mg by mouth daily.    Marland Kitchen warfarin (COUMADIN) 2.5 MG tablet Take 2.5 mg by mouth daily.     No current facility-administered medications for this visit.    Functional Status:   In your present state of health, do you have any difficulty performing the following activities: 06/18/2014 06/18/2014  Hearing? Y (No Data)  Vision? - Y  Difficulty concentrating or making decisions? - Y  Walking or climbing stairs? (No Data) Y  Dressing or bathing? - N  Doing errands, shopping? - Y  Preparing Food and eating ? - Y  Using the Toilet? - N  In the past six months, have you accidently leaked urine? - Y  Do you have problems with loss of bowel  control? - N  Managing your Medications? - Y  Managing your Finances? - Y  Housekeeping or managing your Housekeeping? - Y    Fall/Depression Screening:    PHQ 2/9 Scores 06/18/2014 06/09/2014  PHQ - 2 Score 0 -  Exception Documentation - Patient refusal    Assessment:  Arrived to home visit, he has now moved from a upstairs room into a newly renovated basement living area at his [place] daughter and caregiver  [name] Profitt's home. Greeted patient as he was coming out from his bedroom into the living room area, ambulated approximately 15  feet using a rolling walker gait, very unsteady  reminded to walk up closer into  the walker. Mr. Derousse is alert oriented to person, place, time when looking at the clock and his date of birth. [name] reports that Mr. Deroo continues to fall frequently most falls occur with patient heading to the bathroom or kitchen,recent fall 6/15. Mr.Vidales states" I just need to slow down". [name] and Mr.Vellucci states that he carries his cell phone with him as he moves throughout his room, and calls her as needed , he also has a  life alert type system and home bluetooth speaker system.  [name] Profitt voiced concern regarding Mr. Ingle  continued  frequent falls and well as "decline in his memory" reports sometimes he even forgets where he's at. [name] reports patient with decreased fall during the time that he was receiving physical/occupational therapy at home and ask if it is possible for them to resume services, Discussed emmi handout on  dementia education. [name] states that she plans to make contact next week with the  referrals resources for custodial care that she received from [place] assist with care at home.   Plan: Will place a telephone call to  Dr. Raul Del office to inform  of concerns regarding falls any memory impairment, and consideration for PT/OT services, will  fax today's visit summary to MD Next [facility] home  visit scheduled for July 20 at 10 am  [place] Problem One        Patient Outreach Telephone from 06/09/2014 in [place] Problem One  Respite Care for patient    Care Plan for Problem One  Active   THN Long Term Goal (31-90 days)  Patient will have respite care set-up within 31 days    THN Long Term Goal Start Date  06/09/14   Interventions for Problem One Long Term Goal  Caregiver will look into regional aging adults association for resources   [facility] CM Short Term Goal #1 (0-30 days)  Patient will contact regional aging adult association within 2 weeks    THN CM Short Term  Goal #1 Start Date  06/09/14   Interventions for Short Term Goal #1  CSW provided caregiver with contact number    THN CM Short Term Goal #2 (0-30 days)  Patient will contact regional consolidated services for assistance within 2 weeks    THN CM Short Term Goal #2 Start Date  06/09/14   Interventions for Short Term Goal #2  CSW made referral for regional consolidated services     [facility] CM Care Plan Problem Two        Patient Outreach from 08/08/2014 in [place] Problem Two  Frequent [place] for Problem Two  Active   Interventions for Problem Two Long Term Goal   Reviewed fall prevention measures,   THN Long Term Goal (31-90) days  Patient will be able to report no falls with injury for next 31 days   THN Long Term Goal Start Date  08/08/14 Christell Constant reviewed, unmet, goal date restarted]   THN CM Short Term Goal #1 (0-30 days)  Patient will utiilize walker during ambulation at all  times   [facility] CM Short Term Goal #1 Start Date  08/08/14 Barrie Folk reviewed unmet, restarted]   Interventions for Short Term Goal #2   Discussed importance of using rolling walker correctly at all time, emmi handout on dementia and being safe at home   [facility] CM Short Term Goal #2 (0-30 days)  Patient will be able to report a walking routine in home at least 3 days per week for the next 30  days    THN CM Short Term Goal #2 Start Date  08/08/14 Christell Constant reviewed, undate, date restarted]   Interventions for Short Term Goal #2  Discussed with importance of being active, including continuing exercises taught by physical therapy, consider asking MD regarding therapy reconsult in home for short term,         Joylene Draft, RN, [place] Care Management [phone]

## 2014-08-18 ENCOUNTER — Ambulatory Visit: Payer: Medicare Other | Admitting: Gastroenterology

## 2014-08-19 ENCOUNTER — Encounter (HOSPITAL_COMMUNITY): Payer: Self-pay | Admitting: Emergency Medicine

## 2014-08-19 ENCOUNTER — Emergency Department (HOSPITAL_COMMUNITY): Payer: Medicare Other

## 2014-08-19 ENCOUNTER — Inpatient Hospital Stay (HOSPITAL_COMMUNITY)
Admission: EM | Admit: 2014-08-19 | Discharge: 2014-08-22 | DRG: 689 | Disposition: A | Discharge status: Skilled Nursing Facility | Payer: Medicare Other | Attending: Internal Medicine | Admitting: Internal Medicine

## 2014-08-19 DIAGNOSIS — G9340 Encephalopathy, unspecified: Secondary | ICD-10-CM | POA: Diagnosis not present

## 2014-08-19 DIAGNOSIS — F0390 Unspecified dementia without behavioral disturbance: Secondary | ICD-10-CM | POA: Diagnosis present

## 2014-08-19 DIAGNOSIS — I4891 Unspecified atrial fibrillation: Secondary | ICD-10-CM | POA: Diagnosis present

## 2014-08-19 DIAGNOSIS — B952 Enterococcus as the cause of diseases classified elsewhere: Secondary | ICD-10-CM | POA: Diagnosis present

## 2014-08-19 DIAGNOSIS — S0990XA Unspecified injury of head, initial encounter: Secondary | ICD-10-CM | POA: Diagnosis not present

## 2014-08-19 DIAGNOSIS — F32A Depression, unspecified: Secondary | ICD-10-CM | POA: Diagnosis present

## 2014-08-19 DIAGNOSIS — M25552 Pain in left hip: Secondary | ICD-10-CM | POA: Diagnosis not present

## 2014-08-19 DIAGNOSIS — Z7901 Long term (current) use of anticoagulants: Secondary | ICD-10-CM | POA: Diagnosis not present

## 2014-08-19 DIAGNOSIS — N3000 Acute cystitis without hematuria: Secondary | ICD-10-CM | POA: Diagnosis not present

## 2014-08-19 DIAGNOSIS — E785 Hyperlipidemia, unspecified: Secondary | ICD-10-CM | POA: Diagnosis present

## 2014-08-19 DIAGNOSIS — I10 Essential (primary) hypertension: Secondary | ICD-10-CM | POA: Diagnosis present

## 2014-08-19 DIAGNOSIS — F329 Major depressive disorder, single episode, unspecified: Secondary | ICD-10-CM | POA: Diagnosis present

## 2014-08-19 DIAGNOSIS — R197 Diarrhea, unspecified: Secondary | ICD-10-CM | POA: Diagnosis present

## 2014-08-19 DIAGNOSIS — Z902 Acquired absence of lung [part of]: Secondary | ICD-10-CM | POA: Diagnosis present

## 2014-08-19 DIAGNOSIS — Z9841 Cataract extraction status, right eye: Secondary | ICD-10-CM

## 2014-08-19 DIAGNOSIS — Z85118 Personal history of other malignant neoplasm of bronchus and lung: Secondary | ICD-10-CM | POA: Diagnosis not present

## 2014-08-19 DIAGNOSIS — R0602 Shortness of breath: Secondary | ICD-10-CM

## 2014-08-19 DIAGNOSIS — M25561 Pain in right knee: Secondary | ICD-10-CM | POA: Diagnosis not present

## 2014-08-19 DIAGNOSIS — Z8673 Personal history of transient ischemic attack (TIA), and cerebral infarction without residual deficits: Secondary | ICD-10-CM

## 2014-08-19 DIAGNOSIS — K219 Gastro-esophageal reflux disease without esophagitis: Secondary | ICD-10-CM | POA: Diagnosis present

## 2014-08-19 DIAGNOSIS — Z8551 Personal history of malignant neoplasm of bladder: Secondary | ICD-10-CM

## 2014-08-19 DIAGNOSIS — I481 Persistent atrial fibrillation: Secondary | ICD-10-CM | POA: Diagnosis not present

## 2014-08-19 DIAGNOSIS — N390 Urinary tract infection, site not specified: Principal | ICD-10-CM | POA: Diagnosis present

## 2014-08-19 DIAGNOSIS — Z882 Allergy status to sulfonamides status: Secondary | ICD-10-CM | POA: Diagnosis not present

## 2014-08-19 DIAGNOSIS — E8881 Metabolic syndrome: Secondary | ICD-10-CM | POA: Diagnosis not present

## 2014-08-19 DIAGNOSIS — C679 Malignant neoplasm of bladder, unspecified: Secondary | ICD-10-CM | POA: Diagnosis present

## 2014-08-19 DIAGNOSIS — M25559 Pain in unspecified hip: Secondary | ICD-10-CM | POA: Diagnosis not present

## 2014-08-19 DIAGNOSIS — G629 Polyneuropathy, unspecified: Secondary | ICD-10-CM | POA: Diagnosis not present

## 2014-08-19 DIAGNOSIS — Z87891 Personal history of nicotine dependence: Secondary | ICD-10-CM | POA: Diagnosis not present

## 2014-08-19 DIAGNOSIS — Z9842 Cataract extraction status, left eye: Secondary | ICD-10-CM

## 2014-08-19 DIAGNOSIS — M25562 Pain in left knee: Secondary | ICD-10-CM | POA: Diagnosis not present

## 2014-08-19 DIAGNOSIS — W19XXXA Unspecified fall, initial encounter: Secondary | ICD-10-CM | POA: Diagnosis present

## 2014-08-19 DIAGNOSIS — C7800 Secondary malignant neoplasm of unspecified lung: Secondary | ICD-10-CM | POA: Diagnosis not present

## 2014-08-19 DIAGNOSIS — S199XXA Unspecified injury of neck, initial encounter: Secondary | ICD-10-CM | POA: Diagnosis not present

## 2014-08-19 DIAGNOSIS — Z87440 Personal history of urinary (tract) infections: Secondary | ICD-10-CM | POA: Diagnosis not present

## 2014-08-19 DIAGNOSIS — Z66 Do not resuscitate: Secondary | ICD-10-CM | POA: Diagnosis present

## 2014-08-19 DIAGNOSIS — I480 Paroxysmal atrial fibrillation: Secondary | ICD-10-CM | POA: Diagnosis not present

## 2014-08-19 DIAGNOSIS — G9341 Metabolic encephalopathy: Secondary | ICD-10-CM | POA: Diagnosis present

## 2014-08-19 DIAGNOSIS — C3490 Malignant neoplasm of unspecified part of unspecified bronchus or lung: Secondary | ICD-10-CM | POA: Diagnosis present

## 2014-08-19 DIAGNOSIS — R296 Repeated falls: Secondary | ICD-10-CM | POA: Diagnosis not present

## 2014-08-19 HISTORY — DX: Major depressive disorder, single episode, unspecified: F32.9

## 2014-08-19 HISTORY — DX: Depression, unspecified: F32.A

## 2014-08-19 LAB — COMPREHENSIVE METABOLIC PANEL
ALT: 10 U/L — AB (ref 17–63)
AST: 32 U/L (ref 15–41)
Albumin: 3.9 g/dL (ref 3.5–5.0)
Alkaline Phosphatase: 54 U/L (ref 38–126)
Anion gap: 9 (ref 5–15)
BUN: 21 mg/dL — ABNORMAL HIGH (ref 6–20)
CALCIUM: 9.6 mg/dL (ref 8.9–10.3)
CO2: 25 mmol/L (ref 22–32)
Chloride: 103 mmol/L (ref 101–111)
Creatinine, Ser: 1.23 mg/dL (ref 0.61–1.24)
GFR, EST NON AFRICAN AMERICAN: 53 mL/min — AB (ref >60)
GLUCOSE: 115 mg/dL — AB (ref 65–99)
POTASSIUM: 4.6 mmol/L (ref 3.5–5.1)
SODIUM: 137 mmol/L (ref 135–145)
TOTAL PROTEIN: 7.2 g/dL (ref 6.5–8.1)
Total Bilirubin: 1.5 mg/dL — ABNORMAL HIGH (ref 0.3–1.2)

## 2014-08-19 LAB — BRAIN NATRIURETIC PEPTIDE: B Natriuretic Peptide: 107.6 pg/mL — ABNORMAL HIGH (ref 0.0–100.0)

## 2014-08-19 LAB — CBC WITH DIFFERENTIAL/PLATELET
BASOS ABS: 0 10*3/uL (ref 0.0–0.1)
Basophils Relative: 0 % (ref 0–1)
EOS PCT: 1 % (ref 0–5)
Eosinophils Absolute: 0.1 10*3/uL (ref 0.0–0.7)
HEMATOCRIT: 46.6 % (ref 39.0–52.0)
Hemoglobin: 15.3 g/dL (ref 13.0–17.0)
Lymphocytes Relative: 7 % — ABNORMAL LOW (ref 12–46)
Lymphs Abs: 1.2 10*3/uL (ref 0.7–4.0)
MCH: 29 pg (ref 26.0–34.0)
MCHC: 32.8 g/dL (ref 30.0–36.0)
MCV: 88.3 fL (ref 78.0–100.0)
Monocytes Absolute: 2.3 10*3/uL — ABNORMAL HIGH (ref 0.1–1.0)
Monocytes Relative: 14 % — ABNORMAL HIGH (ref 3–12)
NEUTROS ABS: 13.1 10*3/uL — AB (ref 1.7–7.7)
NEUTROS PCT: 78 % — AB (ref 43–77)
PLATELETS: 142 10*3/uL — AB (ref 150–400)
RBC: 5.28 MIL/uL (ref 4.22–5.81)
RDW: 16.3 % — AB (ref 11.5–15.5)
WBC: 16.8 10*3/uL — ABNORMAL HIGH (ref 4.0–10.5)

## 2014-08-19 LAB — URINALYSIS, ROUTINE W REFLEX MICROSCOPIC
BILIRUBIN URINE: NEGATIVE
Glucose, UA: NEGATIVE mg/dL
Ketones, ur: NEGATIVE mg/dL
NITRITE: NEGATIVE
PROTEIN: 30 mg/dL — AB
Specific Gravity, Urine: 1.013 (ref 1.005–1.030)
UROBILINOGEN UA: 0.2 mg/dL (ref 0.0–1.0)
pH: 5.5 (ref 5.0–8.0)

## 2014-08-19 LAB — URINE MICROSCOPIC-ADD ON

## 2014-08-19 LAB — TROPONIN I: Troponin I: <0.03 ng/mL (ref <0.031)

## 2014-08-19 MED ORDER — CIPROFLOXACIN IN D5W 200 MG/100ML IV SOLN
200.0000 mg | Freq: Two times a day (BID) | INTRAVENOUS | Status: DC
  Administered 2014-08-20 (×3): 200 mg via INTRAVENOUS
  Filled 2014-08-19 (×7): qty 100

## 2014-08-19 MED ORDER — OXYCODONE-ACETAMINOPHEN 5-325 MG PO TABS
1.0000 | ORAL_TABLET | ORAL | Status: DC | PRN
  Administered 2014-08-19 – 2014-08-21 (×3): 1 via ORAL
  Filled 2014-08-19 (×3): qty 1

## 2014-08-19 NOTE — H&P (Signed)
[facility] History and Physical  CAGE GUPTON HYW:[id number] DOB: 06-Nov-1930 DOA: 08/19/2014  Referring physician: ED physician PCP: [name], MD  Specialists:   Chief Complaint: fall, left hip pain and diarrhea  HPI: [name] is a 81 y.o. 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  Patient is accompanied by his granddaughter. She reports that he has been experiencing frequent falls over the last couple of weeks. Patient has reportedly become confused and very weak over the last couple of days. His gait is very unsteady and he is very weak. He fell last night and has been complaining of pain in the left hip ever since. He has not been able to stand or walk on it since. Patient has had recurrent urinary tract infections. Granddaughter is concerned that his weakness and confusion may be caused by UTI. Patient does not have symptoms of UTI. Pt also has chronic diarrhea, but no nausea, vomiting or abdominal pain. He had  2 movements with loose stool yesterday. Patient does not have chest pain, shortness of breath, cough, unilateral weakness.  In ED, patient was found to have leukocytosis with WBC 16.8, temperature normal, no tachycardia, electrolytes okay, negative troponin, BNP 107.6, positive urinalysis for UTI. Chest x-ray is negative for acute abnormalities. X-ray of bilaterally knees and the left hip did not show bony fracture. Patient is admitted to inpatient for further eval and treatment.  Where does patient live?   At home    Can patient participate in ADLs?  None   Review of Systems:   General: no fevers,  chills, no changes in body weight, has poor appetite, has fatigue HEENT: no blurry vision, hearing changes or sore throat Pulm: no dyspnea, coughing, wheezing CV: no chest pain, palpitations Abd: no nausea, vomiting, abdominal pain, has diarrhea, no constipation GU: no dysuria, burning on urination, increased urinary frequency, hematuria  Ext: no leg edema Neuro: no unilateral weakness, numbness, or tingling, no vision change or hearing loss. Had confusion. Skin: no rash MSK: No muscle spasm, no deformity, no limitation of range of movement in spin Heme: No easy bruising.  Travel history: No recent long distant travel.  Allergy:  Allergies  Allergen Reactions  . Sulfa Antibiotics Nausea And Vomiting  . Sulfonamide Derivatives Nausea And Vomiting    Past Medical History  Diagnosis Date  . Atrial fibrillation   . Hyperlipidemia   . Hypertension   . UTI (urinary tract infection)   . Dementia   . Bladder cancer     hx of surgery and chemo  . Lung cancer     Status post surgery resection  . Diverticulitis   . Depression 08/19/2014    Past Surgical History  Procedure Laterality Date  . Hernia repair  2006  . Cataract extraction, bilateral  1998, 2000  . Cystectomy  2002  . Lung removal, partial      Social History:  reports that he quit smoking about 22 years ago. He has never used smokeless tobacco. He reports that he does not drink alcohol or use illicit drugs.  Family History:  Family History  Problem Relation Age of Onset  . Cancer Maternal Aunt  unknown  . Cancer Maternal Grandmother     unknown  . Aneurysm Brother   . Stroke Father      Prior to Admission medications   Medication Sig Start Date End Date Taking? Authorizing Provider  ALPHA LIPOIC ACID PO Take 200-300 mg by mouth daily.    Historical Provider, MD  ascorbic acid (VITAMIN C) 1000 MG tablet Take 1,000 mg by mouth daily.    Historical Provider, MD  atorvastatin (LIPITOR) 10 MG tablet Take 10 mg  by mouth daily.    Historical Provider, MD  atorvastatin (LIPITOR) 10 MG tablet Take 10 mg by mouth daily.    Historical Provider, MD  calcium carbonate (OS-CAL - DOSED IN MG OF ELEMENTAL CALCIUM) 1250 MG tablet Take 1 tablet by mouth daily.    Historical Provider, MD  cephALEXin (KEFLEX) 500 MG capsule Take 500 mg by mouth 2 (two) times daily.    Historical Provider, MD  cholecalciferol (VITAMIN D) 1000 UNITS tablet Take 1,000 Units by mouth daily.    Historical Provider, MD  ciprofloxacin (CIPRO) 500 MG tablet Take 500 mg by mouth 2 (two) times daily.    Historical Provider, MD  Cyanocobalamin (VITAMIN B 12 PO) Take 500 mg by mouth daily.     Historical Provider, MD  esomeprazole (NEXIUM) 40 MG capsule Take 40 mg by mouth daily as needed. For acid reflux    Historical Provider, MD  HYDROcodone-acetaminophen (NORCO/VICODIN) 5-325 MG per tablet Take 1 tablet by mouth every 6 (six) hours as needed for moderate pain. Patient not taking: Reported on 06/18/2014 04/04/14   Bjorn Pippin, PA-C  HYDROcodone-acetaminophen (NORCO/VICODIN) 5-325 MG per tablet Take 1 tablet by mouth every 6 (six) hours as needed. Patient not taking: Reported on 06/18/2014 05/23/14   Theodis Blaze, MD  hydrocortisone valerate cream (WESTCORT) 0.2 % Apply 1 application topically 2 (two) times daily as needed.     Historical Provider, MD  levofloxacin (LEVAQUIN) 500 MG tablet Take 1 tablet (500 mg total) by mouth daily. Patient not taking: Reported on 08/08/2014 05/24/14   Theodis Blaze, MD  MELATONIN PO Take 1 tablet by mouth at bedtime as needed (sleep).    Historical Provider, MD  metoprolol succinate (TOPROL-XL) 50 MG 24 hr tablet Take 50 mg by mouth daily. Take with or immediately following a meal.    Historical Provider, MD  metoprolol succinate (TOPROL-XL) 50 MG 24 hr tablet Take 50 mg by mouth daily. Take with or immediately following a meal.    Historical Provider, MD  metroNIDAZOLE (FLAGYL) 500 MG tablet Take 500 mg by  mouth 3 (three) times daily. Every 8 hours    Historical Provider, MD  nitrofurantoin (MACRODANTIN) 100 MG capsule Take 100 mg by mouth daily.    Historical Provider, MD  sertraline (ZOLOFT) 100 MG tablet Take 100 mg by mouth daily.    Historical Provider, MD  sertraline (ZOLOFT) 100 MG tablet Take 100 mg by mouth daily.    Historical Provider, MD  warfarin (COUMADIN) 2.5 MG tablet Take 2.5 mg by mouth daily.    Historical Provider, MD    Physical Exam: Filed Vitals:   08/19/14 2000 08/19/14 2130 08/19/14 2145 08/19/14 2345  BP: 136/87 143/81 128/97 133/68  Pulse: 78 83  60  Temp:    98.7 F (37.1 C)  TempSrc:    Oral  Resp:  '22 19 20  '$ Height:    '6\' 1"'$  (1.854 m)  Weight:    91.1 kg (200 lb  13.4 oz)  SpO2: 95% 95%  93%   General: Not in acute distress HEENT:       Eyes: PERRL, EOMI, no scleral icterus.       ENT: No discharge from the ears and nose, no pharynx injection, no tonsillar enlargement.        Neck: No JVD, no bruit, no mass felt. Heme: No neck lymph node enlargement. Cardiac: S1/S2, irregularly irregular rhythm, No murmurs, No gallops or rubs. Pulm: No rales, wheezing, rhonchi or rubs. Abd: Soft, nondistended, nontender, no rebound pain, no organomegaly, BS present. Ext: No pitting leg edema bilaterally. 2+DP/PT pulse bilaterally. Musculoskeletal: No joint deformities, No joint redness or warmth, no limitation of ROM in spin. Skin: No rashes.  Neuro: Alert, oriented to person and place, but not to time, cranial nerves II-XII grossly intact, muscle strength 5/5 in all extremities, sensation to light touch intact.  Psych: Patient is not psychotic, no suicidal or hemocidal ideation.  Labs on Admission:  Basic Metabolic Panel:  Recent Labs Lab 08/19/14 2030  NA 137  K 4.6  CL 103  CO2 25  GLUCOSE 115*  BUN 21*  CREATININE 1.23  CALCIUM 9.6   Liver Function Tests:  Recent Labs Lab 08/19/14 2030  AST 32  ALT 10*  ALKPHOS 54  BILITOT 1.5*  PROT 7.2   ALBUMIN 3.9   No results for input(s): LIPASE, AMYLASE in the last 168 hours. No results for input(s): AMMONIA in the last 168 hours. CBC:  Recent Labs Lab 08/19/14 2030  WBC 16.8*  NEUTROABS 13.1*  HGB 15.3  HCT 46.6  MCV 88.3  PLT 142*   Cardiac Enzymes:  Recent Labs Lab 08/19/14 2030  TROPONINI <0.03    BNP (last 3 results)  Recent Labs  08/19/14 2032  BNP 107.6*    ProBNP (last 3 results) No results for input(s): PROBNP in the last 8760 hours.  CBG: No results for input(s): GLUCAP in the last 168 hours.  Radiological Exams on Admission: Dg Chest 2 View  08/19/2014   CLINICAL DATA:  81 year old male with shortness of breath and multiple falls in the past 3 weeks  EXAM: CHEST  2 VIEW  COMPARISON:  Chest radiograph dated 05/21/2014  FINDINGS: Single-view of the chest demonstrate chronic senescent changes of the lungs. No focal consolidation, pleural effusion, or pneumothorax. Stable top normal size cardiac silhouette. Atherosclerotic calcification of the aortic arch. Left hilar surgical clips noted. Degenerative changes of the osseous structures.  IMPRESSION: No acute cardiopulmonary process.   Electronically Signed   By: Anner Crete M.D.   On: 08/19/2014 21:11   Ct Head Wo Contrast  08/19/2014   CLINICAL DATA:  81 year old male with a history of fall.  EXAM: CT HEAD WITHOUT CONTRAST  CT CERVICAL SPINE WITHOUT CONTRAST  TECHNIQUE: Multidetector CT imaging of the head and cervical spine was performed following the standard protocol without intravenous contrast. Multiplanar CT image reconstructions of the cervical spine were also generated.  COMPARISON:  04/08/2014, 05/21/2014, prior MRI 04/08/2014  FINDINGS: CT HEAD FINDINGS  Unremarkable appearance of the calvarium without acute fracture or aggressive lesion.  Unremarkable appearance of the scalp soft tissues.  Unremarkable appearance of the bilateral orbits.  Mastoid air cells are clear.  Trace mucosal disease of  the bilateral maxillary sinuses.  No acute intracranial hemorrhage. No midline shift or mass effect. Configuration of the ventricular system is unchanged from the comparison. Confluent hypodensity in the periventricular white matter. Gray-white differentiation is relatively maintained. Senescent brain  volume loss again noted.  CT CERVICAL SPINE FINDINGS  Craniocervical junction unremarkable.  No fracture line identified.  Cervical elements maintain relative anatomic alignment. No subluxation, anterolisthesis, retrolisthesis.  Vertebral body heights relatively maintained.  Disc spaces are narrowed with endplate changes compatible with multilevel degenerative disc disease. No significant bony canal narrowing.  Mild bilateral facet disease, more pronounced on the left.  Soft tissues of the cervical region unremarkable. Dense calcifications of bilateral carotid system.  Unremarkable appearance of the lung apices.  IMPRESSION: CT head:  No CT evidence of acute intracranial abnormality.  Re- demonstration of changes of chronic microvascular ischemic disease.  Cervical Spine:  No CT evidence of acute fracture or malalignment of the cervical spine.  Multilevel degenerative changes of the cervical spine.  Bilateral carotid calcifications.  Signed,  Dulcy Fanny. Earleen Newport, DO  [facility]  [facility]   Electronically Signed   By: Corrie Mckusick D.O.   On: 08/19/2014 20:56   Ct Cervical Spine Wo Contrast  08/19/2014   CLINICAL DATA:  81 year old male with a history of fall.  EXAM: CT HEAD WITHOUT CONTRAST  CT CERVICAL SPINE WITHOUT CONTRAST  TECHNIQUE: Multidetector CT imaging of the head and cervical spine was performed following the standard protocol without intravenous contrast. Multiplanar CT image reconstructions of the cervical spine were also generated.  COMPARISON:  04/08/2014, 05/21/2014, prior MRI 04/08/2014  FINDINGS: CT HEAD FINDINGS  Unremarkable appearance of the  calvarium without acute fracture or aggressive lesion.  Unremarkable appearance of the scalp soft tissues.  Unremarkable appearance of the bilateral orbits.  Mastoid air cells are clear.  Trace mucosal disease of the bilateral maxillary sinuses.  No acute intracranial hemorrhage. No midline shift or mass effect. Configuration of the ventricular system is unchanged from the comparison. Confluent hypodensity in the periventricular white matter. Gray-white differentiation is relatively maintained. Senescent brain volume loss again noted.  CT CERVICAL SPINE FINDINGS  Craniocervical junction unremarkable.  No fracture line identified.  Cervical elements maintain relative anatomic alignment. No subluxation, anterolisthesis, retrolisthesis.  Vertebral body heights relatively maintained.  Disc spaces are narrowed with endplate changes compatible with multilevel degenerative disc disease. No significant bony canal narrowing.  Mild bilateral facet disease, more pronounced on the left.  Soft tissues of the cervical region unremarkable. Dense calcifications of bilateral carotid system.  Unremarkable appearance of the lung apices.  IMPRESSION: CT head:  No CT evidence of acute intracranial abnormality.  Re- demonstration of changes of chronic microvascular ischemic disease.  Cervical Spine:  No CT evidence of acute fracture or malalignment of the cervical spine.  Multilevel degenerative changes of the cervical spine.  Bilateral carotid calcifications.  Signed,  Dulcy Fanny. Earleen Newport, DO  [facility]  [facility]   Electronically Signed   By: Corrie Mckusick D.O.   On: 08/19/2014 20:56   Dg Knee Complete 4 Views Left  08/19/2014   CLINICAL DATA:  Multiple falls over the past 3 weeks. Bilateral knee pain.  EXAM: LEFT KNEE - COMPLETE 4+ VIEW  COMPARISON:  Right knee radiographs of the same day.  FINDINGS: Mild degenerative changes are present in the patellofemoral compartment. The left knee  is located. No acute fracture or subluxation is evident. There is no radiopaque foreign body. Atherosclerotic calcifications are present in the femoral popliteal artery.  IMPRESSION: 1. No acute abnormality or focal lesion to explain the patient's pain. 2. Minimal degenerative changes in the patellofemoral compartment. 3. Atherosclerosis.   Electronically Signed  By: San Morelle M.D.   On: 08/19/2014 21:12   Dg Knee Complete 4 Views Right  08/19/2014   CLINICAL DATA:  Bilateral knee pain after multiple falls in the past 3 weeks.  EXAM: RIGHT KNEE - COMPLETE 4+ VIEW  COMPARISON:  None.  FINDINGS: No fracture or dislocation. The alignment is maintained. Mild medial tibial femoral joint space narrowing. Small patellar osteophytes. No joint effusion. Vascular calcifications are seen.  IMPRESSION: Minimal osteoarthritis.  No acute osseous abnormality.   Electronically Signed   By: Jeb Levering M.D.   On: 08/19/2014 21:12   Dg Hip Unilat With Pelvis 2-3 Views Left  08/19/2014   CLINICAL DATA:  81 year old male with a history of posterior and lateral left hip pain for 2 days.  EXAM: LEFT HIP (WITH PELVIS) 2-3 VIEWS  COMPARISON:  CT 06/13/2014  FINDINGS: Bony pelvic ring appears intact.  No fracture line identified.  Diffuse osteopenia.  Multilevel degenerative disc disease of the visualized lumbar spine.  Surgical clips at the bilateral aspect of the anatomic pelvis.  Bilateral hips projects normally over the acetabula.  Visualized aspects of the proximal femurs unremarkable.  Vascular calcifications.  Left hip appears congruent.  Rounded calcific density at the left lateral aspect of the anatomic pelvis was present on the comparison, compatible with a vascular calcification.  IMPRESSION: Negative for acute bony abnormality, however, osteopenia somewhat limits evaluation for nondisplaced fractures and if there is ongoing concern, MRI may be considered.  Atherosclerosis.  Signed,  Dulcy Fanny. Earleen Newport, DO   [facility]  [facility]   Electronically Signed   By: Corrie Mckusick D.O.   On: 08/19/2014 17:12    EKG: Independently reviewed.  Abnormal findings:   A fib, QTc=476  Assessment/Plan Principal Problem:   UTI (urinary tract infection) Active Problems:   Malignant neoplasm of bladder   HLD (hyperlipidemia)   HYPERTENSION, BENIGN   Lung cancer   Atrial fibrillation   History of stroke   Fall   GERD (gastroesophageal reflux disease)   Depression   UTI (lower urinary tract infection)  UTI: Urinalysis is positive for UTI. Patient is not obviously septic on admission. Hemodynamically stable. Patient had history of UTI with Pseudomonas infection, which was pansensitive -will admit to tele bed -start IV cipro to cover Pseudomonas -follow up blood and urine culture -IVF: NS 1L and then 125 cc/h -will get Procalcitonin and trend lactic acid levels  Generalized weakness and confusion: Most likely due to UTI. His mental status is back to the baseline now. -Treat the UTI as above -PT/OT  HLD: Last LDL was 99 on 12/25/08 -Continue home medications: Lipitor  Atrial Fibrillation: CHA2DS2-VASc Score is 5 , needs oral anticoagulation. Heart rate is well controlled. Patient is not on any anti-coagulatants at home. Patient has high risk for fall, therefore is not a good candidate for anticoagulation.  -continue metoprol  GERD: -Protonix  Depression Stable, no suicidal or homicidal ideations. -Continue home medications: Zoloft  Left hip pain: no fracture by X-ray. -When necessary Percocet   DVT ppx: SQ Heparin   Code Status: DNR Family Communication:  Yes, patient's   granddaughter    at bed side Disposition Plan: Admit to inpatient   Date of Service 08/20/2014    Ivor Costa [facility] Pager [phone]  If 7PM-7AM, please contact night-coverage [email or web address] Password [email or web address] 08/20/2014, 1:46 AM

## 2014-08-19 NOTE — ED Provider Notes (Signed)
CSN: [id number]     Arrival date & time 08/19/14  1619 History   First MD Initiated Contact with Patient 08/19/14 1935     Chief Complaint  Patient presents with  . Fall     (Consider location/radiation/quality/duration/timing/severity/associated sxs/prior Treatment) HPI Comments: Patient brought to the emergency department by family. Patient lives with his granddaughter. She reports that he has been experiencing frequent falls over the last couple of weeks. Patient has reportedly become confused and very weak over the last couple of days. His gait is very unsteady and he is very weak. He fell last night and has been complaining of pain in the left hip ever since. He has not been able to stand or walk on it since. Patient has a history of bladder cancer. He has had recurrent urinary tract infections and granddaughter is concerned that his weakness and confusion may be caused by a UTI.  Patient denies headache and neck pain. He has not experiencing any back pain.  Patient is a 81 y.o. male presenting with fall.  Fall    Past Medical History  Diagnosis Date  . Atrial fibrillation   . Hyperlipidemia   . Hypertension   . UTI (urinary tract infection)   . Dementia   . Bladder cancer     hx of surgery and chemo  . Lung cancer     Status post surgery resection  . Diverticulitis   . Depression 08/19/2014   Past Surgical History  Procedure Laterality Date  . Hernia repair  2006  . Cataract extraction, bilateral  1998, 2000  . Cystectomy  2002  . Lung removal, partial     Family History  Problem Relation Age of Onset  . Cancer Maternal Aunt     unknown  . Cancer Maternal Grandmother     unknown  . Aneurysm Brother   . Stroke Father    History  Substance Use Topics  . Smoking status: Former Smoker    Quit date: 10/10/1991  . Smokeless tobacco: Never Used  . Alcohol Use: No    Review of Systems  Constitutional: Positive for fatigue.  Musculoskeletal: Positive for  arthralgias.  Psychiatric/Behavioral: Positive for confusion.  All other systems reviewed and are negative.     Allergies  Sulfa antibiotics and Sulfonamide derivatives  Home Medications   Prior to Admission medications   Medication Sig Start Date End Date Taking? Authorizing Provider  ALPHA LIPOIC ACID PO Take 200-300 mg by mouth daily.    Historical Provider, MD  ascorbic acid (VITAMIN C) 1000 MG tablet Take 1,000 mg by mouth daily.    Historical Provider, MD  atorvastatin (LIPITOR) 10 MG tablet Take 10 mg by mouth daily.    Historical Provider, MD  calcium carbonate (OS-CAL - DOSED IN MG OF ELEMENTAL CALCIUM) 1250 MG tablet Take 1 tablet by mouth daily.    Historical Provider, MD  cephALEXin (KEFLEX) 500 MG capsule Take 500 mg by mouth 2 (two) times daily.    Historical Provider, MD  cholecalciferol (VITAMIN D) 1000 UNITS tablet Take 1,000 Units by mouth daily.    Historical Provider, MD  ciprofloxacin (CIPRO) 500 MG tablet Take 500 mg by mouth 2 (two) times daily.    Historical Provider, MD  Cyanocobalamin (VITAMIN B 12 PO) Take 500 mg by mouth daily.     Historical Provider, MD  esomeprazole (NEXIUM) 40 MG capsule Take 40 mg by mouth daily as needed. For acid reflux    Historical Provider, MD  HYDROcodone-acetaminophen (  NORCO/VICODIN) 5-325 MG per tablet Take 1 tablet by mouth every 6 (six) hours as needed for moderate pain. Patient not taking: Reported on 06/18/2014 04/04/14   Bjorn Pippin, PA-C  HYDROcodone-acetaminophen (NORCO/VICODIN) 5-325 MG per tablet Take 1 tablet by mouth every 6 (six) hours as needed. Patient not taking: Reported on 06/18/2014 05/23/14   Theodis Blaze, MD  hydrocortisone valerate cream (WESTCORT) 0.2 % Apply 1 application topically 2 (two) times daily as needed.     Historical Provider, MD  levofloxacin (LEVAQUIN) 500 MG tablet Take 1 tablet (500 mg total) by mouth daily. Patient not taking: Reported on 08/08/2014 05/24/14   Theodis Blaze, MD  MELATONIN PO  Take 1 tablet by mouth at bedtime as needed (sleep).    Historical Provider, MD  metoprolol succinate (TOPROL-XL) 50 MG 24 hr tablet Take 50 mg by mouth daily. Take with or immediately following a meal.    Historical Provider, MD  metoprolol succinate (TOPROL-XL) 50 MG 24 hr tablet Take 50 mg by mouth daily. Take with or immediately following a meal.    Historical Provider, MD  metroNIDAZOLE (FLAGYL) 500 MG tablet Take 500 mg by mouth 3 (three) times daily. Every 8 hours    Historical Provider, MD  nitrofurantoin (MACRODANTIN) 100 MG capsule Take 100 mg by mouth daily.    Historical Provider, MD  sertraline (ZOLOFT) 100 MG tablet Take 100 mg by mouth daily.    Historical Provider, MD  sertraline (ZOLOFT) 100 MG tablet Take 100 mg by mouth daily.    Historical Provider, MD  warfarin (COUMADIN) 2.5 MG tablet Take 2.5 mg by mouth daily.    Historical Provider, MD   BP 128/97 mmHg  Pulse 83  Temp(Src) 97.3 F (36.3 C) (Oral)  Resp 19  SpO2 95% Physical Exam  Constitutional: He is oriented to person, place, and time. He appears well-developed and well-nourished. No distress.  HENT:  Head: Normocephalic and atraumatic.  Right Ear: Hearing normal.  Left Ear: Hearing normal.  Nose: Nose normal.  Mouth/Throat: Oropharynx is clear and moist and mucous membranes are normal.  Eyes: Conjunctivae and EOM are normal. Pupils are equal, round, and reactive to light.  Neck: Normal range of motion. Neck supple.  Cardiovascular: Regular rhythm, S1 normal and S2 normal.  Exam reveals no gallop and no friction rub.   No murmur heard. Pulmonary/Chest: Effort normal and breath sounds normal. No respiratory distress. He exhibits no tenderness.  Abdominal: Soft. Normal appearance and bowel sounds are normal. There is no hepatosplenomegaly. There is no tenderness. There is no rebound, no guarding, no tenderness at McBurney's point and negative Murphy's sign. No hernia.  Musculoskeletal: Normal range of motion.        Left hip: He exhibits normal range of motion and no tenderness.       Right knee: He exhibits ecchymosis. He exhibits normal range of motion, no swelling, no effusion and no deformity. Tenderness found.       Left knee: He exhibits ecchymosis. He exhibits normal range of motion, no swelling and no effusion. Tenderness found.  Neurological: He is alert and oriented to person, place, and time. He has normal strength. No cranial nerve deficit or sensory deficit. Coordination normal. GCS eye subscore is 4. GCS verbal subscore is 5. GCS motor subscore is 6.  Skin: Skin is warm, dry and intact. No rash noted. No cyanosis.  Psychiatric: He has a normal mood and affect. His speech is normal and behavior is normal. Thought  content normal.  Nursing note and vitals reviewed.   ED Course  Procedures (including critical care time) Labs Review Labs Reviewed  URINALYSIS, ROUTINE W REFLEX MICROSCOPIC (NOT AT [facility]) - Abnormal; Notable for the following:    APPearance TURBID (*)    Hgb urine dipstick LARGE (*)    Protein, ur 30 (*)    Leukocytes, UA LARGE (*)    All other components within normal limits  CBC WITH DIFFERENTIAL/PLATELET - Abnormal; Notable for the following:    WBC 16.8 (*)    RDW 16.3 (*)    Platelets 142 (*)    Neutrophils Relative % 78 (*)    Neutro Abs 13.1 (*)    Lymphocytes Relative 7 (*)    Monocytes Relative 14 (*)    Monocytes Absolute 2.3 (*)    All other components within normal limits  COMPREHENSIVE METABOLIC PANEL - Abnormal; Notable for the following:    Glucose, Bld 115 (*)    BUN 21 (*)    ALT 10 (*)    Total Bilirubin 1.5 (*)    GFR calc non Af Amer 53 (*)    All other components within normal limits  BRAIN NATRIURETIC PEPTIDE - Abnormal; Notable for the following:    B Natriuretic Peptide 107.6 (*)    All other components within normal limits  URINE MICROSCOPIC-ADD ON - Abnormal; Notable for the following:    Bacteria, UA FEW (*)    All other components  within normal limits  URINE CULTURE  TROPONIN I    Imaging Review Dg Chest 2 View  08/19/2014   CLINICAL DATA:  81 year old male with shortness of breath and multiple falls in the past 3 weeks  EXAM: CHEST  2 VIEW  COMPARISON:  Chest radiograph dated 05/21/2014  FINDINGS: Single-view of the chest demonstrate chronic senescent changes of the lungs. No focal consolidation, pleural effusion, or pneumothorax. Stable top normal size cardiac silhouette. Atherosclerotic calcification of the aortic arch. Left hilar surgical clips noted. Degenerative changes of the osseous structures.  IMPRESSION: No acute cardiopulmonary process.   Electronically Signed   By: Anner Crete M.D.   On: 08/19/2014 21:11   Ct Head Wo Contrast  08/19/2014   CLINICAL DATA:  81 year old male with a history of fall.  EXAM: CT HEAD WITHOUT CONTRAST  CT CERVICAL SPINE WITHOUT CONTRAST  TECHNIQUE: Multidetector CT imaging of the head and cervical spine was performed following the standard protocol without intravenous contrast. Multiplanar CT image reconstructions of the cervical spine were also generated.  COMPARISON:  04/08/2014, 05/21/2014, prior MRI 04/08/2014  FINDINGS: CT HEAD FINDINGS  Unremarkable appearance of the calvarium without acute fracture or aggressive lesion.  Unremarkable appearance of the scalp soft tissues.  Unremarkable appearance of the bilateral orbits.  Mastoid air cells are clear.  Trace mucosal disease of the bilateral maxillary sinuses.  No acute intracranial hemorrhage. No midline shift or mass effect. Configuration of the ventricular system is unchanged from the comparison. Confluent hypodensity in the periventricular white matter. Gray-white differentiation is relatively maintained. Senescent brain volume loss again noted.  CT CERVICAL SPINE FINDINGS  Craniocervical junction unremarkable.  No fracture line identified.  Cervical elements maintain relative anatomic alignment. No subluxation, anterolisthesis,  retrolisthesis.  Vertebral body heights relatively maintained.  Disc spaces are narrowed with endplate changes compatible with multilevel degenerative disc disease. No significant bony canal narrowing.  Mild bilateral facet disease, more pronounced on the left.  Soft tissues of the cervical region unremarkable. Dense calcifications of bilateral  carotid system.  Unremarkable appearance of the lung apices.  IMPRESSION: CT head:  No CT evidence of acute intracranial abnormality.  Re- demonstration of changes of chronic microvascular ischemic disease.  Cervical Spine:  No CT evidence of acute fracture or malalignment of the cervical spine.  Multilevel degenerative changes of the cervical spine.  Bilateral carotid calcifications.  Signed,  Dulcy Fanny. Earleen Newport, DO  [facility]  [facility]   Electronically Signed   By: Corrie Mckusick D.O.   On: 08/19/2014 20:56   Ct Cervical Spine Wo Contrast  08/19/2014   CLINICAL DATA:  81 year old male with a history of fall.  EXAM: CT HEAD WITHOUT CONTRAST  CT CERVICAL SPINE WITHOUT CONTRAST  TECHNIQUE: Multidetector CT imaging of the head and cervical spine was performed following the standard protocol without intravenous contrast. Multiplanar CT image reconstructions of the cervical spine were also generated.  COMPARISON:  04/08/2014, 05/21/2014, prior MRI 04/08/2014  FINDINGS: CT HEAD FINDINGS  Unremarkable appearance of the calvarium without acute fracture or aggressive lesion.  Unremarkable appearance of the scalp soft tissues.  Unremarkable appearance of the bilateral orbits.  Mastoid air cells are clear.  Trace mucosal disease of the bilateral maxillary sinuses.  No acute intracranial hemorrhage. No midline shift or mass effect. Configuration of the ventricular system is unchanged from the comparison. Confluent hypodensity in the periventricular white matter. Gray-white differentiation is relatively maintained. Senescent brain volume  loss again noted.  CT CERVICAL SPINE FINDINGS  Craniocervical junction unremarkable.  No fracture line identified.  Cervical elements maintain relative anatomic alignment. No subluxation, anterolisthesis, retrolisthesis.  Vertebral body heights relatively maintained.  Disc spaces are narrowed with endplate changes compatible with multilevel degenerative disc disease. No significant bony canal narrowing.  Mild bilateral facet disease, more pronounced on the left.  Soft tissues of the cervical region unremarkable. Dense calcifications of bilateral carotid system.  Unremarkable appearance of the lung apices.  IMPRESSION: CT head:  No CT evidence of acute intracranial abnormality.  Re- demonstration of changes of chronic microvascular ischemic disease.  Cervical Spine:  No CT evidence of acute fracture or malalignment of the cervical spine.  Multilevel degenerative changes of the cervical spine.  Bilateral carotid calcifications.  Signed,  Dulcy Fanny. Earleen Newport, DO  [facility]  [facility]   Electronically Signed   By: Corrie Mckusick D.O.   On: 08/19/2014 20:56   Dg Knee Complete 4 Views Left  08/19/2014   CLINICAL DATA:  Multiple falls over the past 3 weeks. Bilateral knee pain.  EXAM: LEFT KNEE - COMPLETE 4+ VIEW  COMPARISON:  Right knee radiographs of the same day.  FINDINGS: Mild degenerative changes are present in the patellofemoral compartment. The left knee is located. No acute fracture or subluxation is evident. There is no radiopaque foreign body. Atherosclerotic calcifications are present in the femoral popliteal artery.  IMPRESSION: 1. No acute abnormality or focal lesion to explain the patient's pain. 2. Minimal degenerative changes in the patellofemoral compartment. 3. Atherosclerosis.   Electronically Signed   By: San Morelle M.D.   On: 08/19/2014 21:12   Dg Knee Complete 4 Views Right  08/19/2014   CLINICAL DATA:  Bilateral knee pain after multiple  falls in the past 3 weeks.  EXAM: RIGHT KNEE - COMPLETE 4+ VIEW  COMPARISON:  None.  FINDINGS: No fracture or dislocation. The alignment is maintained. Mild medial tibial femoral joint space narrowing. Small patellar osteophytes. No joint effusion. Vascular calcifications are seen.  IMPRESSION: Minimal osteoarthritis.  No acute osseous abnormality.   Electronically Signed   By: Jeb Levering M.D.   On: 08/19/2014 21:12   Dg Hip Unilat With Pelvis 2-3 Views Left  08/19/2014   CLINICAL DATA:  81 year old male with a history of posterior and lateral left hip pain for 2 days.  EXAM: LEFT HIP (WITH PELVIS) 2-3 VIEWS  COMPARISON:  CT 06/13/2014  FINDINGS: Bony pelvic ring appears intact.  No fracture line identified.  Diffuse osteopenia.  Multilevel degenerative disc disease of the visualized lumbar spine.  Surgical clips at the bilateral aspect of the anatomic pelvis.  Bilateral hips projects normally over the acetabula.  Visualized aspects of the proximal femurs unremarkable.  Vascular calcifications.  Left hip appears congruent.  Rounded calcific density at the left lateral aspect of the anatomic pelvis was present on the comparison, compatible with a vascular calcification.  IMPRESSION: Negative for acute bony abnormality, however, osteopenia somewhat limits evaluation for nondisplaced fractures and if there is ongoing concern, MRI may be considered.  Atherosclerosis.  Signed,  Dulcy Fanny. Earleen Newport, DO  [facility]  [facility]   Electronically Signed   By: Corrie Mckusick D.O.   On: 08/19/2014 17:12     EKG Interpretation None      MDM   Final diagnoses:  Shortness of breath  UTI (lower urinary tract infection)    Patient presents to the ER for evaluation of generalized weakness, mental status changes and multiple falls. Patient's imaging did not show any acute injury. He had been complaining of left hip pain, but examination revealed that he is able to  actively and passively range of motion at the hip without any pain. X-ray was negative. No concern for cold fracture requiring further imaging.  Lab work revealed normal blood work, but does have evidence of urinary tract infection. Patient does have a history of recurrent urinary tract infection. Most recent culture from March of this year revealed pansensitive Pseudomonas. Patient initiated on IV Cipro. Patient will be admitted to the hospital for further evaluation.    Orpah Greek, MD 08/19/14 [phone]

## 2014-08-19 NOTE — ED Notes (Signed)
Granddaughter st's that pt has had frequent falls over past few weeks.  St's pt fell last pm and today when physical therapist came they were concerned that pt may have broken his hip.  Pt c/o pain to left hip.  Granddaughter also concerned that pt may have UTI

## 2014-08-19 NOTE — ED Notes (Signed)
admiting MD at bedside.

## 2014-08-19 NOTE — ED Notes (Signed)
Patient still off the floor for scans.

## 2014-08-19 NOTE — ED Notes (Signed)
attemoted report.

## 2014-08-19 NOTE — ED Notes (Signed)
Pt took a sip of water after swallowing his percocet, and choked. Pt's daughter states that this happens frequently. This RN listened to pt's lungs, which are clear.

## 2014-08-19 NOTE — ED Notes (Signed)
MD at bedside. 

## 2014-08-20 ENCOUNTER — Inpatient Hospital Stay (HOSPITAL_COMMUNITY): Payer: Medicare Other

## 2014-08-20 ENCOUNTER — Encounter (HOSPITAL_COMMUNITY): Payer: Self-pay | Admitting: Internal Medicine

## 2014-08-20 DIAGNOSIS — N3000 Acute cystitis without hematuria: Secondary | ICD-10-CM

## 2014-08-20 DIAGNOSIS — I480 Paroxysmal atrial fibrillation: Secondary | ICD-10-CM

## 2014-08-20 DIAGNOSIS — G9340 Encephalopathy, unspecified: Secondary | ICD-10-CM

## 2014-08-20 DIAGNOSIS — F329 Major depressive disorder, single episode, unspecified: Secondary | ICD-10-CM

## 2014-08-20 DIAGNOSIS — R531 Weakness: Secondary | ICD-10-CM

## 2014-08-20 LAB — BASIC METABOLIC PANEL
ANION GAP: 10 (ref 5–15)
BUN: 21 mg/dL — ABNORMAL HIGH (ref 6–20)
CHLORIDE: 103 mmol/L (ref 101–111)
CO2: 26 mmol/L (ref 22–32)
Calcium: 9.4 mg/dL (ref 8.9–10.3)
Creatinine, Ser: 1.13 mg/dL (ref 0.61–1.24)
GFR calc Af Amer: >60 mL/min (ref >60)
GFR calc non Af Amer: 59 mL/min — ABNORMAL LOW (ref >60)
GLUCOSE: 116 mg/dL — AB (ref 65–99)
POTASSIUM: 3.6 mmol/L (ref 3.5–5.1)
SODIUM: 139 mmol/L (ref 135–145)

## 2014-08-20 LAB — CBC
HCT: 43.8 % (ref 39.0–52.0)
HEMOGLOBIN: 14.4 g/dL (ref 13.0–17.0)
MCH: 29 pg (ref 26.0–34.0)
MCHC: 32.9 g/dL (ref 30.0–36.0)
MCV: 88.1 fL (ref 78.0–100.0)
PLATELETS: 123 10*3/uL — AB (ref 150–400)
RBC: 4.97 MIL/uL (ref 4.22–5.81)
RDW: 16.3 % — ABNORMAL HIGH (ref 11.5–15.5)
WBC: 15.6 10*3/uL — ABNORMAL HIGH (ref 4.0–10.5)

## 2014-08-20 LAB — PROTIME-INR
INR: 1.26 (ref 0.00–1.49)
Prothrombin Time: 16 seconds — ABNORMAL HIGH (ref 11.6–15.2)

## 2014-08-20 LAB — LACTIC ACID, PLASMA
LACTIC ACID, VENOUS: 0.8 mmol/L (ref 0.5–2.0)
Lactic Acid, Venous: 0.9 mmol/L (ref 0.5–2.0)

## 2014-08-20 LAB — PROCALCITONIN: Procalcitonin: <0.1 ng/mL

## 2014-08-20 LAB — APTT: aPTT: 32 seconds (ref 24–37)

## 2014-08-20 MED ORDER — ALPHA LIPOIC ACID 200 MG PO CAPS: 200.0000 mg | ORAL_CAPSULE | Freq: Every day | ORAL | Status: DC

## 2014-08-20 MED ORDER — METOPROLOL SUCCINATE ER 50 MG PO TB24
50.0000 mg | ORAL_TABLET | Freq: Every day | ORAL | Status: DC
  Administered 2014-08-20 – 2014-08-22 (×3): 50 mg via ORAL
  Filled 2014-08-20 (×3): qty 1

## 2014-08-20 MED ORDER — ACETAMINOPHEN 650 MG RE SUPP: 650.0000 mg | Freq: Four times a day (QID) | RECTAL | Status: DC | PRN

## 2014-08-20 MED ORDER — SERTRALINE HCL 100 MG PO TABS
100.0000 mg | ORAL_TABLET | Freq: Every day | ORAL | Status: DC
  Administered 2014-08-20 – 2014-08-22 (×3): 100 mg via ORAL
  Filled 2014-08-20 (×3): qty 1

## 2014-08-20 MED ORDER — VITAMIN D3 25 MCG (1000 UNIT) PO TABS
1000.0000 [IU] | ORAL_TABLET | Freq: Every day | ORAL | Status: DC
  Administered 2014-08-20 – 2014-08-22 (×3): 1000 [IU] via ORAL
  Filled 2014-08-20 (×3): qty 1

## 2014-08-20 MED ORDER — MELATONIN 1 MG PO CAPS: 1.0000 mg | ORAL_CAPSULE | Freq: Every evening | ORAL | Status: DC | PRN

## 2014-08-20 MED ORDER — ONDANSETRON HCL 4 MG/2ML IJ SOLN: 4.0000 mg | Freq: Four times a day (QID) | INTRAMUSCULAR | Status: DC | PRN

## 2014-08-20 MED ORDER — HEPARIN SODIUM (PORCINE) 5000 UNIT/ML IJ SOLN
5000.0000 [IU] | Freq: Three times a day (TID) | INTRAMUSCULAR | Status: DC
  Administered 2014-08-20 – 2014-08-22 (×7): 5000 [IU] via SUBCUTANEOUS
  Filled 2014-08-20 (×10): qty 1

## 2014-08-20 MED ORDER — SODIUM CHLORIDE 0.9 % IV SOLN
INTRAVENOUS | Status: DC
  Administered 2014-08-20 – 2014-08-21 (×2): via INTRAVENOUS

## 2014-08-20 MED ORDER — ATORVASTATIN CALCIUM 10 MG PO TABS
10.0000 mg | ORAL_TABLET | Freq: Every day | ORAL | Status: DC
  Administered 2014-08-20 – 2014-08-21 (×2): 10 mg via ORAL
  Filled 2014-08-20 (×3): qty 1

## 2014-08-20 MED ORDER — ONDANSETRON HCL 4 MG PO TABS: 4.0000 mg | ORAL_TABLET | Freq: Four times a day (QID) | ORAL | Status: DC | PRN

## 2014-08-20 MED ORDER — VITAMIN C 500 MG PO TABS
1000.0000 mg | ORAL_TABLET | Freq: Every day | ORAL | Status: DC
  Administered 2014-08-20 – 2014-08-22 (×3): 1000 mg via ORAL
  Filled 2014-08-20 (×3): qty 2

## 2014-08-20 MED ORDER — CALCIUM CARBONATE 1250 (500 CA) MG PO TABS
1.0000 | ORAL_TABLET | Freq: Every day | ORAL | Status: DC
  Filled 2014-08-20: qty 1

## 2014-08-20 MED ORDER — SODIUM CHLORIDE 0.9 % IJ SOLN
3.0000 mL | Freq: Two times a day (BID) | INTRAMUSCULAR | Status: DC
  Administered 2014-08-20 – 2014-08-22 (×5): 3 mL via INTRAVENOUS

## 2014-08-20 MED ORDER — CALCIUM CARBONATE 1250 (500 CA) MG PO TABS
1.0000 | ORAL_TABLET | Freq: Every day | ORAL | Status: DC
  Administered 2014-08-20 – 2014-08-21 (×2): 500 mg via ORAL
  Filled 2014-08-20 (×3): qty 1

## 2014-08-20 MED ORDER — HYDROCORTISONE VALERATE 0.2 % EX CREA: 1.0000 "application " | TOPICAL_CREAM | Freq: Two times a day (BID) | CUTANEOUS | Status: DC | PRN

## 2014-08-20 MED ORDER — PANTOPRAZOLE SODIUM 40 MG PO TBEC
40.0000 mg | DELAYED_RELEASE_TABLET | Freq: Every day | ORAL | Status: DC
  Administered 2014-08-20 – 2014-08-22 (×3): 40 mg via ORAL
  Filled 2014-08-20 (×2): qty 1

## 2014-08-20 MED ORDER — ALUM & MAG HYDROXIDE-SIMETH 200-200-20 MG/5ML PO SUSP: 30.0000 mL | Freq: Four times a day (QID) | ORAL | Status: DC | PRN

## 2014-08-20 MED ORDER — ACETAMINOPHEN 325 MG PO TABS: 650.0000 mg | ORAL_TABLET | Freq: Four times a day (QID) | ORAL | Status: DC | PRN

## 2014-08-20 MED ORDER — VITAMIN B 12 250 MCG PO LOZG: 500.0000 mg | LOZENGE | Freq: Every day | ORAL | Status: DC

## 2014-08-20 MED ORDER — CYANOCOBALAMIN 500 MCG PO TABS
500.0000 ug | ORAL_TABLET | Freq: Every day | ORAL | Status: DC
  Administered 2014-08-20 – 2014-08-22 (×3): 500 ug via ORAL
  Filled 2014-08-20 (×3): qty 1

## 2014-08-20 MED ORDER — SODIUM CHLORIDE 0.9 % IV BOLUS (SEPSIS)
1000.0000 mL | Freq: Once | INTRAVENOUS | Status: AC
  Administered 2014-08-20: 1000 mL via INTRAVENOUS

## 2014-08-20 NOTE — Evaluation (Signed)
Occupational Therapy Evaluation Patient Details Name: [name] MRN: [id number] DOB: 09-05-1930 Today's Date: 08/20/2014    History of Present Illness Patioent is an 81 y/o male admitted s/p fall with left hip pain, positive for diarrhea, but knee and hip x-rays negative for bony abnormality.  PMH positive for hypertension, hyperlipidemia, GERD, depression, atrial fibrillation not on Coumadin due to high risk of fall, dementia, history of bladder cancer 2002 (s/p of surgery and bladder reconstruction and chemotherapy), metastasized bladder cancer to lung (posterior status of surgery), stroke, diverticulitis.   Clinical Impression   Pt reports being able to shower, dress and toilet independently prior to admission.  Ambulated with a rollator with multiple falls.  Unclear if this is accurate and no family available to confirm.  Pt presents with impaired cognition, decreased balance with posterior lean, and generalized weakness.  He requires +2 assist for safe transfer.  Pt will need post acute rehab.  Per RN, family is in agreement.  Will follow acutely.   Follow Up Recommendations  SNF;Supervision/Assistance - 24 hour    Equipment Recommendations       Recommendations for Other Services       Precautions / Restrictions Precautions Precautions: Fall      Mobility Bed Mobility Overal bed mobility: Needs Assistance Bed Mobility: Sit to Supine       Sit to supine: Mod assist      Transfers Overall transfer level: Needs assistance Equipment used: Rolling walker (2 wheeled) Transfers: Sit to/from [employer] Sit to Stand: +2 physical assistance;Mod assist Stand pivot transfers: +2 physical assistance;Min assist       General transfer comment: assist to rise and transfer weight anterior,  posterior lean, assist to shift weight to take shuffling steps    Balance     Sitting balance-Leahy Scale: Poor       Standing balance-Leahy Scale: Poor                              ADL Overall ADL's : Needs assistance/impaired Eating/Feeding: Set up;Sitting   Grooming: Wash/dry hands;Wash/dry face;Sitting;Minimal assistance   Upper Body Bathing: Moderate assistance;Sitting   Lower Body Bathing: Total assistance;Bed level   Upper Body Dressing : Minimal assistance;Sitting   Lower Body Dressing: Total assistance;Bed level         Toileting - Clothing Manipulation Details (indicate cue type and reason): pt incontinent of urine             Vision     Perception     Praxis      Pertinent Vitals/Pain Pain Assessment: Faces Faces Pain Scale: Hurts even more Pain Location: L hip Pain Descriptors / Indicators: Sore Pain Intervention(s): Limited activity within patient's tolerance;Monitored during session;Repositioned     Hand Dominance Right   Extremity/Trunk Assessment Upper Extremity Assessment Upper Extremity Assessment: Generalized weakness   Lower Extremity Assessment Lower Extremity Assessment: Generalized weakness   Cervical / Trunk Assessment Cervical / Trunk Exceptions: keeps slightly flexed trunk and knees   Communication Communication Communication: No difficulties   Cognition Arousal/Alertness: Awake/alert Behavior During Therapy: WFL for tasks assessed/performed Overall Cognitive Status: No family/caregiver present to determine baseline cognitive functioning                     General Comments       Exercises       Shoulder Instructions      Home Living Family/patient expects to  be discharged to:: Private residence Living Arrangements: Other relatives;Spouse/significant other (granddaughter and her family live upstairs, pt on lower leve) Available Help at Discharge: Family Type of Home: House Home Access: Stairs to enter     Home Layout: Two level Alternate Level Stairs-Number of Steps: 12 Alternate Level Stairs-Rails: Right;Left;Can reach both Bathroom Shower/Tub:  Occupational psychologist: Standard     Home Equipment: Environmental consultant - 4 wheels;Shower seat;Grab bars - toilet;Grab bars - tub/shower   Additional Comments: no family available to confirm      Prior Functioning/Environment Level of Independence: Needs assistance  Gait / Transfers Assistance Needed: ambulates with 4 [employer] ADL's / Homemaking Assistance Needed: Pt showers in standing, able to dress independently.  Granddaughter does cooking and cleaning.   Pt is able to climb stairs to upper floor for meals.   Comments: Pt reports having had multiple falls, states his wife says he gets in a hurry and the walker gets too far out in front of him.    OT Diagnosis: Generalized weakness;Cognitive deficits;Acute pain   OT Problem List: Decreased activity tolerance;Impaired balance (sitting and/or standing);Decreased strength;Decreased cognition;Decreased coordination;Decreased knowledge of use of DME or AE;Pain   OT Treatment/Interventions: Self-care/ADL training;DME and/or AE instruction;Therapeutic activities;Patient/family education;Balance training;Cognitive remediation/compensation    OT Goals(Current goals can be found in the care plan section) Acute Rehab OT Goals Patient Stated Goal: to rest OT Goal Formulation: With patient Time For Goal Achievement: 09/03/14 Potential to Achieve Goals: Fair ADL Goals Pt Will Perform Grooming: with set-up;sitting Pt Will Perform Upper Body Bathing: with min assist;sitting Pt Will Perform Upper Body Dressing: with min assist;sitting Pt Will Transfer to Toilet: with mod assist;ambulating;bedside commode Additional ADL Goal #1: Pt will sit unsupported x 10 minutes with supervision while engaged in ADL.  OT Frequency: Min 2X/week   Barriers to D/C:            Co-evaluation              End of Session Equipment Utilized During Treatment: Gait belt;Rolling walker  Activity Tolerance: Patient limited by fatigue Patient left: in bed;with  call bell/phone within reach   Time: 1449-1505 OT Time Calculation (min): 16 min Charges:  OT General Charges $OT Visit: 1 Procedure OT Evaluation $Initial OT Evaluation Tier I: 1 Procedure G-Codes:    Malka So 08/20/2014, 3:20 PM  [phone]

## 2014-08-20 NOTE — Progress Notes (Signed)
PHARMACIST - PHYSICIAN ORDER COMMUNICATION  CONCERNING: P&T Medication Policy on Herbal Medications  DESCRIPTION:  This patient's order for:  Alpha lipoic acid and melatonin  has been noted.  This product(s) is classified as an "herbal" or natural product. Due to a lack of definitive safety studies or FDA approval, nonstandard manufacturing practices, plus the potential risk of unknown drug-drug interactions while on inpatient medications, the Pharmacy and Therapeutics Committee does not permit the use of "herbal" or natural products of this type within [facility].   ACTION TAKEN: The pharmacy department is unable to verify this order at this time and your patient has been informed of this safety policy. Please reevaluate patient's clinical condition at discharge and address if the herbal or natural product(s) should be resumed at that time.  Sherlon Handing, PharmD, BCPS Clinical pharmacist, pager [phone] 08/20/2014 12:06 AM

## 2014-08-20 NOTE — Evaluation (Signed)
Physical Therapy Evaluation Patient Details Name: [name] MRN: [id number] DOB: 21-Jan-1931 Today's Date: 08/20/2014   History of Present Illness  Patioent is an 81 y/o male admitted s/p fall with left hip pain, positive for diarrhea, but knee and hip x-rays negative for bony abnormality.  PMH positive for hypertension, hyperlipidemia, GERD, depression, atrial fibrillation not on Coumadin due to high risk of fall, dementia, history of bladder cancer 2002 (s/p of surgery and bladder reconstruction and chemotherapy), metastasized bladder cancer to lung (posterior status of surgery), stroke, diverticulitis.  Clinical Impression  Patient presents with decreased independence with mobility due to deficits listed in PT problem list below.  Patient will benefit from skilled PT in the acute setting to allow return home with family assist following SNF rehab stay.  Patient with multiple falls over at least 6 month period.  Feel not safe for d/c home at this time.    Follow Up Recommendations SNF    Equipment Recommendations  None recommended by PT    Recommendations for Other Services       Precautions / Restrictions Precautions Precautions: Fall      Mobility  Bed Mobility Overal bed mobility: Needs Assistance Bed Mobility: Rolling;Sidelying to Sit Rolling: Mod assist Sidelying to sit: Mod assist       General bed mobility comments: use of rail with cues and needed assist for lifting trunk and guiding legs off bed  Transfers Overall transfer level: Needs assistance Equipment used: Rolling walker (2 wheeled) Transfers: Sit to/from [facility] Sit to Stand: Max assist Stand pivot transfers: Mod assist       General transfer comment: lifting assist from bed and lowering assist to chair, pt stays posterior and needs assist for anterior weight shift and facilitation for weight shift to allow pt to move feet for stand step transfer to chair with  walker.  Ambulation/Gait             General Gait Details: NT due to balance issues, unsafe without 2 person assist, also pt incontinent of urine in standing  Stairs            Wheelchair Mobility    Modified Rankin (Stroke Patients Only)       Balance Overall balance assessment: Needs assistance Sitting-balance support: Bilateral upper extremity supported Sitting balance-Leahy Scale: Poor Sitting balance - Comments: initially mod support for balance, able to sit static with UE support and supervision over 2 minutes Postural control: Posterior lean Standing balance support: Bilateral upper extremity supported Standing balance-Leahy Scale: Poor Standing balance comment: mod support with walkre for balance                             Pertinent Vitals/Pain Pain Assessment: 0-10 Pain Score: 8  Pain Location: left hip Pain Descriptors / Indicators: Sore Pain Intervention(s): Repositioned;Other (comment);Limited activity within patient's tolerance (RN aware)    Home Living Family/patient expects to be discharged to:: Private residence Living Arrangements: Other relatives (grandaughter lives upstairs with her spouse, pt lives on main level) Available Help at Discharge: Family Type of Home: House Home Access: Stairs to enter Entrance Stairs-Rails: Right Entrance Stairs-Number of Steps: 12 (?pt questionable historian, note from prior admission lists 2-3) Home Layout: Two level Home Equipment: Walker - 2 wheels Additional Comments: unlcear of details or history given by pt    Prior Function Level of Independence: Independent with assistive device(s)  Comments: Patient reports walked with walker, has had a dozen falls in 6 months, unclear about length of time he has experienced falls, reports "I get in a hurry"     Hand Dominance        Extremity/Trunk Assessment               Lower Extremity Assessment: Generalized weakness       Cervical / Trunk Assessment: Other exceptions  Communication   Communication: No difficulties  Cognition Arousal/Alertness: Awake/alert Behavior During Therapy: WFL for tasks assessed/performed Overall Cognitive Status: No family/caregiver present to determine baseline cognitive functioning                      General Comments General comments (skin integrity, edema, etc.): Pt incontinent of urine in bed, after stood to change gown and doff underwear, second stand pt incontinent of urine    Exercises        Assessment/Plan    PT Assessment Patient needs continued PT services  PT Diagnosis Difficulty walking;Acute pain;Generalized weakness   PT Problem List Decreased strength;Decreased activity tolerance;Decreased balance;Decreased mobility;Pain;Decreased safety awareness  PT Treatment Interventions DME instruction;Balance training;Gait training;Functional mobility training;Patient/family education;Therapeutic activities;Therapeutic exercise   PT Goals (Current goals can be found in the Care Plan section) Acute Rehab PT Goals Patient Stated Goal: to rest PT Goal Formulation: With patient Time For Goal Achievement: 09/03/14 Potential to Achieve Goals: Fair    Frequency Min 3X/week   Barriers to discharge   patient inconsistent historian, may not have 24 hour care at home    Co-evaluation               End of Session Equipment Utilized During Treatment: Gait belt Activity Tolerance: Patient limited by pain;Patient limited by fatigue Patient left: in chair;with call bell/phone within reach;with chair alarm set;with nursing/sitter in room      Functional Assessment Tool Used: Clinical Judgement Functional Limitation: Mobility: Walking and moving around Mobility: Walking and Moving Around Current Status (M8413): At least 40 percent but less than 60 percent impaired, limited or restricted Mobility: Walking and Moving Around Goal Status [phone]): At least 20  percent but less than 40 percent impaired, limited or restricted    Time: 0927-1011 PT Time Calculation (min) (ACUTE ONLY): 44 min   Charges:   PT Evaluation $Initial PT Evaluation Tier I: 1 Procedure PT Treatments $Therapeutic Activity: 23-37 mins   PT G Codes:   PT G-Codes **NOT FOR INPATIENT CLASS** Functional Assessment Tool Used: Clinical Judgement Functional Limitation: Mobility: Walking and moving around Mobility: Walking and Moving Around Current Status (U2725): At least 40 percent but less than 60 percent impaired, limited or restricted Mobility: Walking and Moving Around Goal Status [phone]): At least 20 percent but less than 40 percent impaired, limited or restricted    [facility] 08/20/2014, 10:52 AM  Magda Kiel, PT [phone] 08/20/2014

## 2014-08-20 NOTE — Progress Notes (Signed)
[facility] PROGRESS NOTE  [name] XBJ:[id number] DOB: 12-May-1930 DOA: 08/19/2014 PCP: [name], MD  Assessment/Plan: #1Urinary tract infection UA consistent with a UTI. Patient afebrile. WBC trending down. Urine cultures pending. Patient with pseudomonas aeruginosa UTI 05/21/2014 which was pansensitive. Continue IV ciprofloxacin. Follow.  #2 left hip pain Plain films of the hip negative. Patient still with complaints of left hip pain. We'll get an MRI of the left hip to rule out until: Fracture. Pain management. Supportive care.  #3 acute encephalopathy Likely secondary to problem #1. Improving. Patient likely close to baseline.  #4 generalized weakness Likely secondary to problem #1 and debility. Patient has been seen by physical therapy were recommending skilled nursing facility. Continue empiric IV antibiotics while urine cultures are pending. PT/OT.  #5 hyperlipidemia Continue Lipitor.  #6 history of atrial fibrillation CHADS2VASC score is 5. Patient's rate is controlled. Patient deemed not a anticoagulation candidate secondary to high risk for falls. Continue metoprolol for rate control.  #7 gastroesophageal reflux disease PPI.  #8 depression Stable. Continue home regimen Zoloft.  #9 prophylaxis PPI for GI prophylaxis. Heparin for DVT prophylaxis.   Code Status: DO NOT RESUSCITATE Family Communication: Updated granddaughter and family at bedside and patient. Disposition Plan: To skilled nursing facility versus home with home health when medically stable.   Consultants:  None  Procedures:  Plain films of the pelvis and left hip 08/19/2014  Chest x-ray 08/19/2014  X-ray of the left knee 08/19/2014  Antibiotics:  IV ciprofloxacin 08/19/2014  HPI/Subjective: Patient alert oriented and following commands. Patient still complaining of left hip pain.  Objective: Filed Vitals:   08/20/14 1000  BP: 149/89  Pulse: 85  Temp:   Resp:      Intake/Output Summary (Last 24 hours) at 08/20/14 1329 Last data filed at 08/20/14 1124  Gross per 24 hour  Intake    590 ml  Output    100 ml  Net    490 ml   Filed Weights   08/19/14 2345  Weight: 91.1 kg (200 lb 13.4 oz)    Exam:   General:  NAD  Cardiovascular: RRR  Respiratory: CTAB anterior lung fields  Abdomen: Soft, nontender, nondistended, positive bowel sounds.  Musculoskeletal: No clubbing, cyanosis or edema.  Data Reviewed: Basic Metabolic Panel:  Recent Labs Lab 08/19/14 2030 08/20/14 0255  NA 137 139  K 4.6 3.6  CL 103 103  CO2 25 26  GLUCOSE 115* 116*  BUN 21* 21*  CREATININE 1.23 1.13  CALCIUM 9.6 9.4   Liver Function Tests:  Recent Labs Lab 08/19/14 2030  AST 32  ALT 10*  ALKPHOS 54  BILITOT 1.5*  PROT 7.2  ALBUMIN 3.9   No results for input(s): LIPASE, AMYLASE in the last 168 hours. No results for input(s): AMMONIA in the last 168 hours. CBC:  Recent Labs Lab 08/19/14 2030 08/20/14 0255  WBC 16.8* 15.6*  NEUTROABS 13.1*  --   HGB 15.3 14.4  HCT 46.6 43.8  MCV 88.3 88.1  PLT 142* 123*   Cardiac Enzymes:  Recent Labs Lab 08/19/14 2030  TROPONINI <0.03   BNP (last 3 results)  Recent Labs  08/19/14 2032  BNP 107.6*    ProBNP (last 3 results) No results for input(s): PROBNP in the last 8760 hours.  CBG: No results for input(s): GLUCAP in the last 168 hours.  Recent Results (from the past 240 hour(s))  Urine culture     Status: None (Preliminary result)   Collection Time: 08/19/14  8:20 PM  Result Value Ref Range Status   Specimen Description URINE, CLEAN CATCH  Final   Special Requests NONE  Final   Culture CULTURE REINCUBATED FOR BETTER GROWTH  Final   Report Status PENDING  Incomplete     Studies: Dg Chest 2 View  08/19/2014   CLINICAL DATA:  81 year old male with shortness of breath and multiple falls in the past 3 weeks  EXAM: CHEST  2 VIEW  COMPARISON:  Chest radiograph dated 05/21/2014   FINDINGS: Single-view of the chest demonstrate chronic senescent changes of the lungs. No focal consolidation, pleural effusion, or pneumothorax. Stable top normal size cardiac silhouette. Atherosclerotic calcification of the aortic arch. Left hilar surgical clips noted. Degenerative changes of the osseous structures.  IMPRESSION: No acute cardiopulmonary process.   Electronically Signed   By: Anner Crete M.D.   On: 08/19/2014 21:11   Ct Head Wo Contrast  08/19/2014   CLINICAL DATA:  81 year old male with a history of fall.  EXAM: CT HEAD WITHOUT CONTRAST  CT CERVICAL SPINE WITHOUT CONTRAST  TECHNIQUE: Multidetector CT imaging of the head and cervical spine was performed following the standard protocol without intravenous contrast. Multiplanar CT image reconstructions of the cervical spine were also generated.  COMPARISON:  04/08/2014, 05/21/2014, prior MRI 04/08/2014  FINDINGS: CT HEAD FINDINGS  Unremarkable appearance of the calvarium without acute fracture or aggressive lesion.  Unremarkable appearance of the scalp soft tissues.  Unremarkable appearance of the bilateral orbits.  Mastoid air cells are clear.  Trace mucosal disease of the bilateral maxillary sinuses.  No acute intracranial hemorrhage. No midline shift or mass effect. Configuration of the ventricular system is unchanged from the comparison. Confluent hypodensity in the periventricular white matter. Gray-white differentiation is relatively maintained. Senescent brain volume loss again noted.  CT CERVICAL SPINE FINDINGS  Craniocervical junction unremarkable.  No fracture line identified.  Cervical elements maintain relative anatomic alignment. No subluxation, anterolisthesis, retrolisthesis.  Vertebral body heights relatively maintained.  Disc spaces are narrowed with endplate changes compatible with multilevel degenerative disc disease. No significant bony canal narrowing.  Mild bilateral facet disease, more pronounced on the left.  Soft  tissues of the cervical region unremarkable. Dense calcifications of bilateral carotid system.  Unremarkable appearance of the lung apices.  IMPRESSION: CT head:  No CT evidence of acute intracranial abnormality.  Re- demonstration of changes of chronic microvascular ischemic disease.  Cervical Spine:  No CT evidence of acute fracture or malalignment of the cervical spine.  Multilevel degenerative changes of the cervical spine.  Bilateral carotid calcifications.  Signed,  Dulcy Fanny. Earleen Newport, DO  [facility]  [facility]   Electronically Signed   By: Corrie Mckusick D.O.   On: 08/19/2014 20:56   Ct Cervical Spine Wo Contrast  08/19/2014   CLINICAL DATA:  81 year old male with a history of fall.  EXAM: CT HEAD WITHOUT CONTRAST  CT CERVICAL SPINE WITHOUT CONTRAST  TECHNIQUE: Multidetector CT imaging of the head and cervical spine was performed following the standard protocol without intravenous contrast. Multiplanar CT image reconstructions of the cervical spine were also generated.  COMPARISON:  04/08/2014, 05/21/2014, prior MRI 04/08/2014  FINDINGS: CT HEAD FINDINGS  Unremarkable appearance of the calvarium without acute fracture or aggressive lesion.  Unremarkable appearance of the scalp soft tissues.  Unremarkable appearance of the bilateral orbits.  Mastoid air cells are clear.  Trace mucosal disease of the bilateral maxillary sinuses.  No acute intracranial hemorrhage. No midline shift or mass  effect. Configuration of the ventricular system is unchanged from the comparison. Confluent hypodensity in the periventricular white matter. Gray-white differentiation is relatively maintained. Senescent brain volume loss again noted.  CT CERVICAL SPINE FINDINGS  Craniocervical junction unremarkable.  No fracture line identified.  Cervical elements maintain relative anatomic alignment. No subluxation, anterolisthesis, retrolisthesis.  Vertebral body heights relatively maintained.   Disc spaces are narrowed with endplate changes compatible with multilevel degenerative disc disease. No significant bony canal narrowing.  Mild bilateral facet disease, more pronounced on the left.  Soft tissues of the cervical region unremarkable. Dense calcifications of bilateral carotid system.  Unremarkable appearance of the lung apices.  IMPRESSION: CT head:  No CT evidence of acute intracranial abnormality.  Re- demonstration of changes of chronic microvascular ischemic disease.  Cervical Spine:  No CT evidence of acute fracture or malalignment of the cervical spine.  Multilevel degenerative changes of the cervical spine.  Bilateral carotid calcifications.  Signed,  Dulcy Fanny. Earleen Newport, DO  [facility]  [facility]   Electronically Signed   By: Corrie Mckusick D.O.   On: 08/19/2014 20:56   Dg Knee Complete 4 Views Left  08/19/2014   CLINICAL DATA:  Multiple falls over the past 3 weeks. Bilateral knee pain.  EXAM: LEFT KNEE - COMPLETE 4+ VIEW  COMPARISON:  Right knee radiographs of the same day.  FINDINGS: Mild degenerative changes are present in the patellofemoral compartment. The left knee is located. No acute fracture or subluxation is evident. There is no radiopaque foreign body. Atherosclerotic calcifications are present in the femoral popliteal artery.  IMPRESSION: 1. No acute abnormality or focal lesion to explain the patient's pain. 2. Minimal degenerative changes in the patellofemoral compartment. 3. Atherosclerosis.   Electronically Signed   By: San Morelle M.D.   On: 08/19/2014 21:12   Dg Knee Complete 4 Views Right  08/19/2014   CLINICAL DATA:  Bilateral knee pain after multiple falls in the past 3 weeks.  EXAM: RIGHT KNEE - COMPLETE 4+ VIEW  COMPARISON:  None.  FINDINGS: No fracture or dislocation. The alignment is maintained. Mild medial tibial femoral joint space narrowing. Small patellar osteophytes. No joint effusion. Vascular  calcifications are seen.  IMPRESSION: Minimal osteoarthritis.  No acute osseous abnormality.   Electronically Signed   By: Jeb Levering M.D.   On: 08/19/2014 21:12   Dg Hip Unilat With Pelvis 2-3 Views Left  08/19/2014   CLINICAL DATA:  81 year old male with a history of posterior and lateral left hip pain for 2 days.  EXAM: LEFT HIP (WITH PELVIS) 2-3 VIEWS  COMPARISON:  CT 06/13/2014  FINDINGS: Bony pelvic ring appears intact.  No fracture line identified.  Diffuse osteopenia.  Multilevel degenerative disc disease of the visualized lumbar spine.  Surgical clips at the bilateral aspect of the anatomic pelvis.  Bilateral hips projects normally over the acetabula.  Visualized aspects of the proximal femurs unremarkable.  Vascular calcifications.  Left hip appears congruent.  Rounded calcific density at the left lateral aspect of the anatomic pelvis was present on the comparison, compatible with a vascular calcification.  IMPRESSION: Negative for acute bony abnormality, however, osteopenia somewhat limits evaluation for nondisplaced fractures and if there is ongoing concern, MRI may be considered.  Atherosclerosis.  Signed,  Dulcy Fanny. Earleen Newport, DO  [facility]  [facility]   Electronically Signed   By: Corrie Mckusick D.O.   On: 08/19/2014 17:12    Scheduled Meds: . atorvastatin  10 mg Oral q1800  . calcium carbonate  1 tablet Oral Q breakfast  . cholecalciferol  1,000 Units Oral Daily  . ciprofloxacin  200 mg Intravenous Q12H  . vitamin B-12  500 mcg Oral Daily  . heparin  5,000 Units Subcutaneous 3 times per day  . metoprolol succinate  50 mg Oral Daily  . pantoprazole  40 mg Oral Daily  . sertraline  100 mg Oral Daily  . sodium chloride  3 mL Intravenous Q12H  . ascorbic acid  1,000 mg Oral Daily   Continuous Infusions: . sodium chloride 125 mL/hr at 08/20/14 0300    Principal Problem:   UTI (urinary tract infection) Active Problems:   Malignant  neoplasm of bladder   HLD (hyperlipidemia)   HYPERTENSION, BENIGN   Lung cancer   Atrial fibrillation   History of stroke   Fall   GERD (gastroesophageal reflux disease)   Depression   UTI (lower urinary tract infection)    Time spent: 40 mins    [facility] MD [facility] Pager [phone]. If 7PM-7AM, please contact night-coverage at [email or web address], password [facility] 08/20/2014, 1:29 PM  LOS: 1 day

## 2014-08-21 DIAGNOSIS — N390 Urinary tract infection, site not specified: Principal | ICD-10-CM

## 2014-08-21 DIAGNOSIS — G9341 Metabolic encephalopathy: Secondary | ICD-10-CM | POA: Diagnosis present

## 2014-08-21 DIAGNOSIS — I10 Essential (primary) hypertension: Secondary | ICD-10-CM

## 2014-08-21 LAB — BASIC METABOLIC PANEL
Anion gap: 10 (ref 5–15)
BUN: 16 mg/dL (ref 6–20)
CO2: 25 mmol/L (ref 22–32)
Calcium: 8.9 mg/dL (ref 8.9–10.3)
Chloride: 104 mmol/L (ref 101–111)
Creatinine, Ser: 1.03 mg/dL (ref 0.61–1.24)
GFR calc Af Amer: >60 mL/min (ref >60)
GFR calc non Af Amer: >60 mL/min (ref >60)
GLUCOSE: 104 mg/dL — AB (ref 65–99)
Potassium: 4.2 mmol/L (ref 3.5–5.1)
Sodium: 139 mmol/L (ref 135–145)

## 2014-08-21 LAB — CBC WITH DIFFERENTIAL/PLATELET
Basophils Absolute: 0.1 10*3/uL (ref 0.0–0.1)
Basophils Relative: 0 % (ref 0–1)
Eosinophils Absolute: 0.3 10*3/uL (ref 0.0–0.7)
Eosinophils Relative: 2 % (ref 0–5)
HEMATOCRIT: 42.3 % (ref 39.0–52.0)
Hemoglobin: 13.7 g/dL (ref 13.0–17.0)
Lymphocytes Relative: 8 % — ABNORMAL LOW (ref 12–46)
Lymphs Abs: 1.2 10*3/uL (ref 0.7–4.0)
MCH: 28.6 pg (ref 26.0–34.0)
MCHC: 32.4 g/dL (ref 30.0–36.0)
MCV: 88.3 fL (ref 78.0–100.0)
MONO ABS: 1.9 10*3/uL — AB (ref 0.1–1.0)
MONOS PCT: 13 % — AB (ref 3–12)
Neutro Abs: 11.1 10*3/uL — ABNORMAL HIGH (ref 1.7–7.7)
Neutrophils Relative %: 77 % (ref 43–77)
Platelets: 118 10*3/uL — ABNORMAL LOW (ref 150–400)
RBC: 4.79 MIL/uL (ref 4.22–5.81)
RDW: 16 % — AB (ref 11.5–15.5)
WBC: 14.5 10*3/uL — AB (ref 4.0–10.5)

## 2014-08-21 MED ORDER — IBUPROFEN 400 MG PO TABS
400.0000 mg | ORAL_TABLET | Freq: Three times a day (TID) | ORAL | Status: DC
  Administered 2014-08-21 – 2014-08-22 (×4): 400 mg via ORAL
  Filled 2014-08-21 (×6): qty 1

## 2014-08-21 MED ORDER — CIPROFLOXACIN HCL 250 MG PO TABS
250.0000 mg | ORAL_TABLET | Freq: Two times a day (BID) | ORAL | Status: DC
  Administered 2014-08-21 – 2014-08-22 (×3): 250 mg via ORAL
  Filled 2014-08-21 (×6): qty 1

## 2014-08-21 MED ORDER — CALCIUM CARBONATE 1250 (500 CA) MG PO TABS
1.0000 | ORAL_TABLET | Freq: Every day | ORAL | Status: DC
  Administered 2014-08-22: 500 mg via ORAL
  Filled 2014-08-21: qty 1

## 2014-08-21 NOTE — Clinical Social Work Placement (Signed)
   CLINICAL SOCIAL WORK PLACEMENT  NOTE  Date:  08/21/2014  Patient Details  Name: [name] MRN: [id number] Date of Birth: Jul 07, 1930  Clinical Social Work is seeking post-discharge placement for this patient at the [place] level of care (*CSW will initial, date and re-position this form in  chart as items are completed):  Yes   Patient/family provided with [place] Work Department's list of facilities offering this level of care within the geographic area requested by the patient (or if unable, by the patient's family).  Yes   Patient/family informed of their freedom to choose among providers that offer the needed level of care, that participate in Medicare, Medicaid or managed care program needed by the patient, have an available bed and are willing to accept the patient.  Yes   Patient/family informed of [facility]'s ownership interest in [facility] and [facility], as well as of the fact that they are under no obligation to receive care at these facilities.  PASRR submitted to EDS on       PASRR number received on       Existing PASRR number confirmed on 08/21/14     FL2 transmitted to all facilities in geographic area requested by pt/family on 08/21/14     FL2 transmitted to all facilities within larger geographic area on       Patient informed that his/her managed care company has contracts with or will negotiate with certain facilities, including the following:            Patient/family informed of bed offers received.  Patient chooses bed at       Physician recommends and patient chooses bed at      Patient to be transferred to   on  .  Patient to be transferred to facility by       Patient family notified on   of transfer.  Name of family member notified:        PHYSICIAN       Additional Comment:  Patient and family only agreeable to [facility].  _______________________________________________ Rigoberto Noel, LCSW 08/21/2014, 6:30 PM

## 2014-08-21 NOTE — Progress Notes (Signed)
[facility] PROGRESS NOTE  [name] XBJ:[id number] DOB: Oct 01, 1930 DOA: 08/19/2014 PCP: [name], MD  Assessment/Plan: #1Urinary tract infection UA consistent with a UTI. Patient afebrile. WBC trending down. Urine cultures pending. Patient with pseudomonas aeruginosa UTI 05/21/2014 which was pansensitive. Continue ciprofloxacin. Follow.  #2 left hip pain Plain films of the hip negative. MRI of the left hip negative for fracture, with soft tissue contusion posteriorly in the left gluteus maximus muscle with mild adjacent trochanteric bursitis. Will place on scheduled NSAIDS. Pain management. Supportive care.  #3 acute encephalopathy Likely secondary to problem #1. Improving. Patient likely close to baseline.  #4 generalized weakness Likely secondary to problem #1 and debility. Patient has been seen by physical therapy were recommending skilled nursing facility. Continue empiric antibiotics while urine cultures are pending. PT/OT.  #5 hyperlipidemia Continue Lipitor.  #6 history of atrial fibrillation CHADS2VASC score is 5. Patient's rate is controlled. Patient deemed not a anticoagulation candidate secondary to high risk for falls. Continue metoprolol for rate control.  #7 gastroesophageal reflux disease PPI.  #8 depression Stable. Continue home regimen Zoloft.  #9 prophylaxis PPI for GI prophylaxis. Heparin for DVT prophylaxis.   Code Status: DO NOT RESUSCITATE Family Communication: Updated granddaughter and family at bedside and patient. Disposition Plan: To skilled nursing facility when medically stable, hopefully tomorrow.   Consultants:  None  Procedures:  Plain films of the pelvis and left hip 08/19/2014  Chest x-ray 08/19/2014  X-ray of the left knee 08/19/2014  MRI left hip 08/20/14  Antibiotics:  IV ciprofloxacin 08/19/2014  HPI/Subjective: Patient states he's feeling better. Patient denies CP.   Objective: Filed Vitals:    08/21/14 0955  BP: 157/88  Pulse: 88  Temp:   Resp: 18    Intake/Output Summary (Last 24 hours) at 08/21/14 1500 Last data filed at 08/21/14 0955  Gross per 24 hour  Intake   1180 ml  Output    700 ml  Net    480 ml   Filed Weights   08/19/14 2345 08/21/14 0720  Weight: 91.1 kg (200 lb 13.4 oz) 92.5 kg (203 lb 14.8 oz)    Exam:   General:  NAD  Cardiovascular: RRR  Respiratory: CTAB anterior lung fields  Abdomen: Soft, nontender, nondistended, positive bowel sounds.  Musculoskeletal: No clubbing, cyanosis or edema.  Data Reviewed: Basic Metabolic Panel:  Recent Labs Lab 08/19/14 2030 08/20/14 0255 08/21/14 0425  NA 137 139 139  K 4.6 3.6 4.2  CL 103 103 104  CO2 '25 26 25  '$ GLUCOSE 115* 116* 104*  BUN 21* 21* 16  CREATININE 1.23 1.13 1.03  CALCIUM 9.6 9.4 8.9   Liver Function Tests:  Recent Labs Lab 08/19/14 2030  AST 32  ALT 10*  ALKPHOS 54  BILITOT 1.5*  PROT 7.2  ALBUMIN 3.9   No results for input(s): LIPASE, AMYLASE in the last 168 hours. No results for input(s): AMMONIA in the last 168 hours. CBC:  Recent Labs Lab 08/19/14 2030 08/20/14 0255 08/21/14 0425  WBC 16.8* 15.6* 14.5*  NEUTROABS 13.1*  --  11.1*  HGB 15.3 14.4 13.7  HCT 46.6 43.8 42.3  MCV 88.3 88.1 88.3  PLT 142* 123* 118*   Cardiac Enzymes:  Recent Labs Lab 08/19/14 2030  TROPONINI <0.03   BNP (last 3 results)  Recent Labs  08/19/14 2032  BNP 107.6*    ProBNP (last 3 results) No results for input(s): PROBNP in the last 8760 hours.  CBG: No results for input(s): GLUCAP  in the last 168 hours.  Recent Results (from the past 240 hour(s))  Urine culture     Status: None (Preliminary result)   Collection Time: 08/19/14  8:20 PM  Result Value Ref Range Status   Specimen Description URINE, CLEAN CATCH  Final   Special Requests NONE  Final   Culture CULTURE REINCUBATED FOR BETTER GROWTH  Final   Report Status PENDING  Incomplete     Studies: Dg Chest 2  View  08/19/2014   CLINICAL DATA:  81 year old male with shortness of breath and multiple falls in the past 3 weeks  EXAM: CHEST  2 VIEW  COMPARISON:  Chest radiograph dated 05/21/2014  FINDINGS: Single-view of the chest demonstrate chronic senescent changes of the lungs. No focal consolidation, pleural effusion, or pneumothorax. Stable top normal size cardiac silhouette. Atherosclerotic calcification of the aortic arch. Left hilar surgical clips noted. Degenerative changes of the osseous structures.  IMPRESSION: No acute cardiopulmonary process.   Electronically Signed   By: Anner Crete M.D.   On: 08/19/2014 21:11   Ct Head Wo Contrast  08/19/2014   CLINICAL DATA:  81 year old male with a history of fall.  EXAM: CT HEAD WITHOUT CONTRAST  CT CERVICAL SPINE WITHOUT CONTRAST  TECHNIQUE: Multidetector CT imaging of the head and cervical spine was performed following the standard protocol without intravenous contrast. Multiplanar CT image reconstructions of the cervical spine were also generated.  COMPARISON:  04/08/2014, 05/21/2014, prior MRI 04/08/2014  FINDINGS: CT HEAD FINDINGS  Unremarkable appearance of the calvarium without acute fracture or aggressive lesion.  Unremarkable appearance of the scalp soft tissues.  Unremarkable appearance of the bilateral orbits.  Mastoid air cells are clear.  Trace mucosal disease of the bilateral maxillary sinuses.  No acute intracranial hemorrhage. No midline shift or mass effect. Configuration of the ventricular system is unchanged from the comparison. Confluent hypodensity in the periventricular white matter. Gray-white differentiation is relatively maintained. Senescent brain volume loss again noted.  CT CERVICAL SPINE FINDINGS  Craniocervical junction unremarkable.  No fracture line identified.  Cervical elements maintain relative anatomic alignment. No subluxation, anterolisthesis, retrolisthesis.  Vertebral body heights relatively maintained.  Disc spaces are  narrowed with endplate changes compatible with multilevel degenerative disc disease. No significant bony canal narrowing.  Mild bilateral facet disease, more pronounced on the left.  Soft tissues of the cervical region unremarkable. Dense calcifications of bilateral carotid system.  Unremarkable appearance of the lung apices.  IMPRESSION: CT head:  No CT evidence of acute intracranial abnormality.  Re- demonstration of changes of chronic microvascular ischemic disease.  Cervical Spine:  No CT evidence of acute fracture or malalignment of the cervical spine.  Multilevel degenerative changes of the cervical spine.  Bilateral carotid calcifications.  Signed,  Dulcy Fanny. Earleen Newport, DO  [facility]  [facility]   Electronically Signed   By: Corrie Mckusick D.O.   On: 08/19/2014 20:56   Ct Cervical Spine Wo Contrast  08/19/2014   CLINICAL DATA:  81 year old male with a history of fall.  EXAM: CT HEAD WITHOUT CONTRAST  CT CERVICAL SPINE WITHOUT CONTRAST  TECHNIQUE: Multidetector CT imaging of the head and cervical spine was performed following the standard protocol without intravenous contrast. Multiplanar CT image reconstructions of the cervical spine were also generated.  COMPARISON:  04/08/2014, 05/21/2014, prior MRI 04/08/2014  FINDINGS: CT HEAD FINDINGS  Unremarkable appearance of the calvarium without acute fracture or aggressive lesion.  Unremarkable appearance of the scalp soft tissues.  Unremarkable  appearance of the bilateral orbits.  Mastoid air cells are clear.  Trace mucosal disease of the bilateral maxillary sinuses.  No acute intracranial hemorrhage. No midline shift or mass effect. Configuration of the ventricular system is unchanged from the comparison. Confluent hypodensity in the periventricular white matter. Gray-white differentiation is relatively maintained. Senescent brain volume loss again noted.  CT CERVICAL SPINE FINDINGS  Craniocervical junction  unremarkable.  No fracture line identified.  Cervical elements maintain relative anatomic alignment. No subluxation, anterolisthesis, retrolisthesis.  Vertebral body heights relatively maintained.  Disc spaces are narrowed with endplate changes compatible with multilevel degenerative disc disease. No significant bony canal narrowing.  Mild bilateral facet disease, more pronounced on the left.  Soft tissues of the cervical region unremarkable. Dense calcifications of bilateral carotid system.  Unremarkable appearance of the lung apices.  IMPRESSION: CT head:  No CT evidence of acute intracranial abnormality.  Re- demonstration of changes of chronic microvascular ischemic disease.  Cervical Spine:  No CT evidence of acute fracture or malalignment of the cervical spine.  Multilevel degenerative changes of the cervical spine.  Bilateral carotid calcifications.  Signed,  Dulcy Fanny. Earleen Newport, DO  [facility]  [facility]   Electronically Signed   By: Corrie Mckusick D.O.   On: 08/19/2014 20:56   Mr Hip Left Wo Contrast  08/20/2014   CLINICAL DATA:  Left hip pain status post fall. On Coumadin therapy. Evaluate for occult fracture or other pathology. Dementia. Initial encounter.  EXAM: MR OF THE LEFT HIP WITHOUT CONTRAST  TECHNIQUE: Multiplanar, multisequence MR imaging was performed. No intravenous contrast was administered.  COMPARISON:  Radiographs 08/19/2014.  CT 06/13/2014 and 08/30/2013.  FINDINGS: Examination is mildly motion degraded.  Bones: Both femoral heads appear normal without evidence of acute fracture, dislocation or avascular necrosis. The visualized bony pelvis appears normal. The sacroiliac joints and symphysis pubis appear normal. There are advanced degenerative changes within the lower lumbar spine with disc space loss and osteophytes. There is T2 hyperintensity within the L3-4 and L4-5 discs with adjacent endplates cystic changes, grossly unchanged from prior  CT.  Articular cartilage and labrum  Articular cartilage: Mild degenerative changes are present at both hips.  Labrum: There is no gross labral tear or paralabral abnormality.  Joint or bursal effusion  Joint effusion: A small amount of hip joint fluid bilaterally is within physiologic limits.  Bursae: There is a small amount of peritrochanteric bursal fluid on the left.  Muscles and tendons  Muscles and tendons: There is asymmetric edema posteriorly within the left gluteus maximus muscle, posterior to the proximal femur, consistent with muscular contusion. No focal fluid collection demonstrated. The gluteus and hamstring tendons appear intact.  Other findings  Miscellaneous: Bladder trabeculation and sigmoid colon diverticular changes are grossly stable. Known horseshoe kidney partially imaged.  IMPRESSION: 1. Soft tissue contusion posteriorly in the left gluteus maximus muscle with mild adjacent trochanteric bursitis. 2. No evidence of pelvic hematoma. 3. No evidence of acute fracture or dislocation. 4. Prominent endplate degenerative changes in the lower lumbar spine. There is T2 hyperintensity within the L3-4 and L4-5 discs which is probably degenerative given the stability of the adjacent endplates rather than disc space infection.   Electronically Signed   By: Richardean Sale M.D.   On: 08/20/2014 19:58   Dg Knee Complete 4 Views Left  08/19/2014   CLINICAL DATA:  Multiple falls over the past 3 weeks. Bilateral knee pain.  EXAM: LEFT KNEE - COMPLETE 4+ VIEW  COMPARISON:  Right knee radiographs of the same day.  FINDINGS: Mild degenerative changes are present in the patellofemoral compartment. The left knee is located. No acute fracture or subluxation is evident. There is no radiopaque foreign body. Atherosclerotic calcifications are present in the femoral popliteal artery.  IMPRESSION: 1. No acute abnormality or focal lesion to explain the patient's pain. 2. Minimal degenerative changes in the  patellofemoral compartment. 3. Atherosclerosis.   Electronically Signed   By: San Morelle M.D.   On: 08/19/2014 21:12   Dg Knee Complete 4 Views Right  08/19/2014   CLINICAL DATA:  Bilateral knee pain after multiple falls in the past 3 weeks.  EXAM: RIGHT KNEE - COMPLETE 4+ VIEW  COMPARISON:  None.  FINDINGS: No fracture or dislocation. The alignment is maintained. Mild medial tibial femoral joint space narrowing. Small patellar osteophytes. No joint effusion. Vascular calcifications are seen.  IMPRESSION: Minimal osteoarthritis.  No acute osseous abnormality.   Electronically Signed   By: Jeb Levering M.D.   On: 08/19/2014 21:12   Dg Hip Unilat With Pelvis 2-3 Views Left  08/19/2014   CLINICAL DATA:  81 year old male with a history of posterior and lateral left hip pain for 2 days.  EXAM: LEFT HIP (WITH PELVIS) 2-3 VIEWS  COMPARISON:  CT 06/13/2014  FINDINGS: Bony pelvic ring appears intact.  No fracture line identified.  Diffuse osteopenia.  Multilevel degenerative disc disease of the visualized lumbar spine.  Surgical clips at the bilateral aspect of the anatomic pelvis.  Bilateral hips projects normally over the acetabula.  Visualized aspects of the proximal femurs unremarkable.  Vascular calcifications.  Left hip appears congruent.  Rounded calcific density at the left lateral aspect of the anatomic pelvis was present on the comparison, compatible with a vascular calcification.  IMPRESSION: Negative for acute bony abnormality, however, osteopenia somewhat limits evaluation for nondisplaced fractures and if there is ongoing concern, MRI may be considered.  Atherosclerosis.  Signed,  Dulcy Fanny. Earleen Newport, DO  [facility]  [facility]   Electronically Signed   By: Corrie Mckusick D.O.   On: 08/19/2014 17:12    Scheduled Meds: . atorvastatin  10 mg Oral q1800  . [START ON 08/22/2014] calcium carbonate  1 tablet Oral Daily  . cholecalciferol  1,000 Units  Oral Daily  . ciprofloxacin  250 mg Oral BID  . vitamin B-12  500 mcg Oral Daily  . heparin  5,000 Units Subcutaneous 3 times per day  . ibuprofen  400 mg Oral TID  . metoprolol succinate  50 mg Oral Daily  . pantoprazole  40 mg Oral Daily  . sertraline  100 mg Oral Daily  . sodium chloride  3 mL Intravenous Q12H  . ascorbic acid  1,000 mg Oral Daily   Continuous Infusions: . sodium chloride 75 mL/hr at 08/21/14 0531    Principal Problem:   UTI (urinary tract infection) Active Problems:   Malignant neoplasm of bladder   HLD (hyperlipidemia)   HYPERTENSION, BENIGN   Lung cancer   Atrial fibrillation   History of stroke   Fall   GERD (gastroesophageal reflux disease)   Depression   UTI (lower urinary tract infection)    Time spent: 40 mins    [facility] MD [facility] Pager [phone]. If 7PM-7AM, please contact night-coverage at [email or web address], password [facility] 08/21/2014, 3:00 PM  LOS: 2 days

## 2014-08-21 NOTE — Clinical Social Work Note (Signed)
Clinical Social Work Assessment  Patient Details  Name: [name] MRN: [id number] Date of Birth: 11/22/1930  Date of referral:  08/21/14               Reason for consult:  Discharge Planning, Facility Placement                Permission sought to share information with:  Facility Sport and exercise psychologist, Family Supports Permission granted to share information::  No  Name::     Contractor::  [facility] [employer] [place]  Relationship::     Contact Information:     Housing/Transportation Living arrangements for the past 2 months:  Single Family Home Source of Information:  Adult Children (Granddaughter and daughter) Patient Interpreter Needed:  None Criminal Activity/Legal Involvement Pertinent to Current Situation/Hospitalization:  No - Comment as needed Significant Relationships:  Adult Children, Other Family Members Lives with:  Self Do you feel safe going back to the place where you live?  Yes Need for family participation in patient care:  Yes (Comment)  Care giving concerns:  Patient's family requests that patient go to [facility] at discharge as patient cannot take care of himself at home right now.    Social Worker assessment / plan: CSW met with patient and patient's family at bedside. Family states that the patient will need to DC to [facility] when medically stable. Patient also agreeable to this. CSW explained SNF search/placement process and answered the family's questions. CSW will followup with available bed offers.   Employment status:  Disabled (Comment on whether or not currently receiving Disability) Insurance information:  Medicare PT Recommendations:  [place] / Referral to community resources:  [place]  Patient/Family's Response to care:  Family and patient seem to be happy with the care the patient is receiving.  Patient/Family's Understanding of and Emotional Response  to Diagnosis, Current Treatment, and Prognosis:  Family has good insight into reason for admission and good understanding of what the patient's post DC needs will be. Family does have questions for the doctor.   Emotional Assessment Appearance:  Appears stated age Attitude/Demeanor/Rapport:  Other (Appropriate/reserved) Affect (typically observed):  Calm Orientation:  Oriented to Self Alcohol / Substance use:  Tobacco Use (Hx) Psych involvement (Current and /or in the community):  No (Comment)  Discharge Needs  Concerns to be addressed:  Discharge Planning Concerns Readmission within the last 30 days:  Yes Current discharge risk:  Physical Impairment, Cognitively Impaired Barriers to Discharge:  Continued Medical Work up   [employer] MSW, [place], [place], [phone]

## 2014-08-21 NOTE — Progress Notes (Signed)
[facility]  Patient Status: Active (receiving services up to time of hospitalization)  [facility] is providing the following services: PT  If patient discharges after hours, please call [phone].   [name] 08/21/2014, 10:59 AM

## 2014-08-22 ENCOUNTER — Encounter: Payer: Self-pay | Admitting: *Deleted

## 2014-08-22 DIAGNOSIS — M25559 Pain in unspecified hip: Secondary | ICD-10-CM | POA: Diagnosis not present

## 2014-08-22 DIAGNOSIS — K219 Gastro-esophageal reflux disease without esophagitis: Secondary | ICD-10-CM | POA: Diagnosis not present

## 2014-08-22 DIAGNOSIS — C7800 Secondary malignant neoplasm of unspecified lung: Secondary | ICD-10-CM | POA: Diagnosis not present

## 2014-08-22 DIAGNOSIS — I10 Essential (primary) hypertension: Secondary | ICD-10-CM | POA: Diagnosis not present

## 2014-08-22 DIAGNOSIS — N319 Neuromuscular dysfunction of bladder, unspecified: Secondary | ICD-10-CM | POA: Diagnosis not present

## 2014-08-22 DIAGNOSIS — B952 Enterococcus as the cause of diseases classified elsewhere: Secondary | ICD-10-CM | POA: Diagnosis not present

## 2014-08-22 DIAGNOSIS — R933 Abnormal findings on diagnostic imaging of other parts of digestive tract: Secondary | ICD-10-CM | POA: Diagnosis not present

## 2014-08-22 DIAGNOSIS — M25552 Pain in left hip: Secondary | ICD-10-CM | POA: Diagnosis not present

## 2014-08-22 DIAGNOSIS — I480 Paroxysmal atrial fibrillation: Secondary | ICD-10-CM | POA: Diagnosis not present

## 2014-08-22 DIAGNOSIS — C679 Malignant neoplasm of bladder, unspecified: Secondary | ICD-10-CM | POA: Diagnosis not present

## 2014-08-22 DIAGNOSIS — N390 Urinary tract infection, site not specified: Secondary | ICD-10-CM | POA: Diagnosis not present

## 2014-08-22 DIAGNOSIS — G9340 Encephalopathy, unspecified: Secondary | ICD-10-CM | POA: Diagnosis not present

## 2014-08-22 DIAGNOSIS — Z8673 Personal history of transient ischemic attack (TIA), and cerebral infarction without residual deficits: Secondary | ICD-10-CM | POA: Diagnosis not present

## 2014-08-22 DIAGNOSIS — E785 Hyperlipidemia, unspecified: Secondary | ICD-10-CM | POA: Diagnosis not present

## 2014-08-22 DIAGNOSIS — R2689 Other abnormalities of gait and mobility: Secondary | ICD-10-CM | POA: Diagnosis not present

## 2014-08-22 DIAGNOSIS — E440 Moderate protein-calorie malnutrition: Secondary | ICD-10-CM | POA: Diagnosis not present

## 2014-08-22 DIAGNOSIS — Z8551 Personal history of malignant neoplasm of bladder: Secondary | ICD-10-CM | POA: Diagnosis not present

## 2014-08-22 DIAGNOSIS — F331 Major depressive disorder, recurrent, moderate: Secondary | ICD-10-CM | POA: Diagnosis not present

## 2014-08-22 DIAGNOSIS — I4891 Unspecified atrial fibrillation: Secondary | ICD-10-CM | POA: Diagnosis not present

## 2014-08-22 DIAGNOSIS — R739 Hyperglycemia, unspecified: Secondary | ICD-10-CM | POA: Diagnosis not present

## 2014-08-22 DIAGNOSIS — M6281 Muscle weakness (generalized): Secondary | ICD-10-CM | POA: Diagnosis not present

## 2014-08-22 LAB — BASIC METABOLIC PANEL
ANION GAP: 10 (ref 5–15)
BUN: 22 mg/dL — ABNORMAL HIGH (ref 6–20)
CALCIUM: 9.1 mg/dL (ref 8.9–10.3)
CO2: 25 mmol/L (ref 22–32)
Chloride: 107 mmol/L (ref 101–111)
Creatinine, Ser: 1.11 mg/dL (ref 0.61–1.24)
GFR calc Af Amer: >60 mL/min (ref >60)
GFR calc non Af Amer: >60 mL/min (ref >60)
Glucose, Bld: 98 mg/dL (ref 65–99)
Potassium: 4.5 mmol/L (ref 3.5–5.1)
Sodium: 142 mmol/L (ref 135–145)

## 2014-08-22 LAB — URINE CULTURE

## 2014-08-22 LAB — CBC
HCT: 42.2 % (ref 39.0–52.0)
Hemoglobin: 13.6 g/dL (ref 13.0–17.0)
MCH: 28.6 pg (ref 26.0–34.0)
MCHC: 32.2 g/dL (ref 30.0–36.0)
MCV: 88.8 fL (ref 78.0–100.0)
Platelets: 110 10*3/uL — ABNORMAL LOW (ref 150–400)
RBC: 4.75 MIL/uL (ref 4.22–5.81)
RDW: 15.8 % — AB (ref 11.5–15.5)
WBC: 12.9 10*3/uL — ABNORMAL HIGH (ref 4.0–10.5)

## 2014-08-22 MED ORDER — IBUPROFEN 400 MG PO TABS: 400.0000 mg | ORAL_TABLET | Freq: Four times a day (QID) | ORAL | Status: DC | PRN

## 2014-08-22 MED ORDER — AMOXICILLIN-POT CLAVULANATE 875-125 MG PO TABS: 1.0000 | ORAL_TABLET | Freq: Two times a day (BID) | ORAL | Status: DC

## 2014-08-22 MED ORDER — OXYCODONE-ACETAMINOPHEN 5-325 MG PO TABS: 1.0000 | ORAL_TABLET | Freq: Four times a day (QID) | ORAL | Status: DC | PRN

## 2014-08-22 MED ORDER — NITROFURANTOIN MACROCRYSTAL 100 MG PO CAPS: 100.0000 mg | ORAL_CAPSULE | Freq: Every day | ORAL | Status: DC

## 2014-08-22 MED ORDER — AMOXICILLIN-POT CLAVULANATE 875-125 MG PO TABS
1.0000 | ORAL_TABLET | Freq: Two times a day (BID) | ORAL | Status: DC
  Administered 2014-08-22: 1 via ORAL
  Filled 2014-08-22 (×2): qty 1

## 2014-08-22 MED ORDER — ENOXAPARIN SODIUM 40 MG/0.4ML ~~LOC~~ SOLN: 40.0000 mg | SUBCUTANEOUS | Status: DC

## 2014-08-22 NOTE — Clinical Social Work Placement (Signed)
   CLINICAL SOCIAL WORK PLACEMENT  NOTE  Date:  08/22/2014  Patient Details  Name: [name] MRN: [id number] Date of Birth: September 26, 1930  Clinical Social Work is seeking post-discharge placement for this patient at the [place] level of care (*CSW will initial, date and re-position this form in  chart as items are completed):  Yes   Patient/family provided with [place] Work Department's list of facilities offering this level of care within the geographic area requested by the patient (or if unable, by the patient's family).  Yes   Patient/family informed of their freedom to choose among providers that offer the needed level of care, that participate in Medicare, Medicaid or managed care program needed by the patient, have an available bed and are willing to accept the patient.  Yes   Patient/family informed of [facility]'s ownership interest in [facility] and [facility], as well as of the fact that they are under no obligation to receive care at these facilities.  PASRR submitted to EDS on       PASRR number received on       Existing PASRR number confirmed on 08/21/14     FL2 transmitted to all facilities in geographic area requested by pt/family on 08/21/14     FL2 transmitted to all facilities within larger geographic area on       Patient informed that his/her managed care company has contracts with or will negotiate with certain facilities, including the following:         Yes  Patient/family informed of bed offers received.  Patient [facility] bed at  [facility]     Physician recommends and patient chooses bed at      Patient to be transferred to  [facility]  on  08/22/14.  Patient to be transferred to facility by  family     Patient family notified on  08/22/14 of transfer.  Name of family member notified:   Family member at the bedside - daughter [name]     PHYSICIAN       Additional Comment:     _______________________________________________ Sable Feil, LCSW 08/22/2014, 12:01 PM

## 2014-08-22 NOTE — Discharge Summary (Signed)
Physician Discharge Summary  [name] YTK:[id number] DOB: 12-26-1930 DOA: 08/19/2014  PCP: [name], MD  Admit date: 08/19/2014 Discharge date: 08/22/2014  Time spent: 65 minutes  Recommendations for Outpatient Follow-up:  1. Follow-up with M.D. at skilled nursing facility. Patient will need a basic metabolic profile done in 1-2 weeks to follow-up on electrolytes and renal function.  Discharge Diagnoses:  Principal Problem:   UTI (urinary tract infection) due to Enterococcus faecium Active Problems:   Acute encephalopathy   Hip pain   Malignant neoplasm of bladder   HLD (hyperlipidemia)   HYPERTENSION, BENIGN   Lung cancer   Atrial fibrillation   History of stroke   Fall   GERD (gastroesophageal reflux disease)   Depression   UTI (lower urinary tract infection)   Discharge Condition: Stable and improved  Diet recommendation: Regular  Filed Weights   08/19/14 2345 08/21/14 0720 08/22/14 0530  Weight: 91.1 kg (200 lb 13.4 oz) 92.5 kg (203 lb 14.8 oz) 90.8 kg (200 lb 2.8 oz)    History of present illness:  Per Dr Bufford Spikes is a 81 y.o. male with PMH of hypertension, hyperlipidemia, GERD, depression, atrial fibrillation not on Coumadin due to high risk of fall, dementia, history of a test that does state for bladder cancer 2002 (s/p of surgery and bladder reconstruction and chemotherapy), metastasized bladder cancer to lung (posterior status of surgery), stroke, diverticulitis, who presented with fall, left hip pain and diarrhea.  Patient was accompanied by his granddaughter. She reported that he had been experiencing frequent falls over the last couple of weeks. Patient had reportedly become confused and very weak over the last couple of days. His gait was very unsteady and he was very weak. He fell the night prior to admission, and had been complaining of pain in the left hip ever since. He had not been able to stand or walk on it since. Patient has  had recurrent urinary tract infections. Granddaughter was concerned that his weakness and confusion may be caused by UTI. Patient did not have symptoms of UTI. Pt also has chronic diarrhea, but no nausea, vomiting or abdominal pain. He had 2 movements with loose stool/soft stool the day prior to admission. Patient did not have chest pain, shortness of breath, cough, unilateral weakness.  In ED, patient was found to have leukocytosis with WBC 16.8, temperature normal, no tachycardia, electrolytes okay, negative troponin, BNP 107.6, positive urinalysis for UTI. Chest x-ray was negative for acute abnormalities. X-ray of bilaterally knees and the left hip did not show bony fracture. Patient was admitted to inpatient for further eval and treatment  Hospital Course:  #1 Enterococcus faecium Urinary tract infection Patient was admitted with confusion noted to have a leukocytosis and urinalysis consistent with a UTI. Patient was initially placed empirically on IV ciprofloxacin as his prior urine cultures from 05/21/2014 had grown pseudomonas aeruginosa which was pansensitive. Patient's WBC trended down. Patient improved clinically. Urine cultures did grow out enterococcus faecium which was sensitive to ampicillin and vancomycin and Zyvox however resistant to Levaquin and Macrobid. Patient was subsequently transitioned to oral Augmentin be discharged to a skilled nursing facility with 7 days of oral Augmentin. Post treatment for UTI patient may be placed back on his home regimen of prophylactic Macrobid.   #2 left hip pain Plain films of the hip negative. MRI of the left hip negative for fracture, with soft tissue contusion posteriorly in the left gluteus maximus muscle with mild adjacent trochanteric bursitis. Patient  was placed on scheduled NSAIDS with clinical improvement. Pain management. Supportive care.  #3 acute encephalopathy Likely secondary to problem #1. Improved. Patient likely close to  baseline.  #4 generalized weakness Likely secondary to problem #1 and debility. Patient was seen by physical therapy were recommending skilled nursing facility. Patient was initially placed empirically on IV ciprofloxacin while urine culture results were pending. Patient was seen by PT/OT were recommended skilled nursing facility.   #5 hyperlipidemia Continued on Lipitor.  #6 history of atrial fibrillation CHADS2VASC score is 5. Patient's rate was controlled throughout the hospitalization. Patient deemed not a anticoagulation candidate secondary to high risk for falls. Continued on metoprolol for rate control.  #7 gastroesophageal reflux disease Patient was maintained on a PPI.  #8 depression Stable. Continued on home regimen Zoloft.  Procedures:  Plain films of the pelvis and left hip 08/19/2014  Chest x-ray 08/19/2014  X-ray of the left knee 08/19/2014  MRI left hip 08/20/14  Consultations:  None  Discharge Exam: Filed Vitals:   08/22/14 0615  BP: 160/93  Pulse:   Temp:   Resp:     General: NAD Cardiovascular: RRR Respiratory: CTAB  Discharge Instructions   Discharge Instructions    Diet general    Complete by:  As directed      Discharge instructions    Complete by:  As directed   Follow up with MD at SNF.     Increase activity slowly    Complete by:  As directed           Current Discharge Medication List    START taking these medications   Details  amoxicillin-clavulanate (AUGMENTIN) 875-125 MG per tablet Take 1 tablet by mouth every 12 (twelve) hours. Take for 7 days then stop. Qty: 14 tablet, Refills: 0    enoxaparin (LOVENOX) 40 MG/0.4ML injection Inject 0.4 mLs (40 mg total) into the skin daily. Qty: 0 Syringe    ibuprofen (ADVIL,MOTRIN) 400 MG tablet Take 1 tablet (400 mg total) by mouth every 6 (six) hours as needed. Take 3 times daily for 3 days, then use every 6 hours as needed. Qty: 30 tablet, Refills: 0    oxyCODONE-acetaminophen  (PERCOCET/ROXICET) 5-325 MG per tablet Take 1 tablet by mouth every 6 (six) hours as needed for moderate pain. Qty: 15 tablet, Refills: 0      CONTINUE these medications which have CHANGED   Details  nitrofurantoin (MACRODANTIN) 100 MG capsule Take 1 capsule (100 mg total) by mouth daily. Resume in 1 week.      CONTINUE these medications which have NOT CHANGED   Details  ALPHA LIPOIC ACID PO Take 200-300 mg by mouth daily.    ascorbic acid (VITAMIN C) 1000 MG tablet Take 1,000 mg by mouth daily.    atorvastatin (LIPITOR) 10 MG tablet Take 10 mg by mouth daily.    calcium carbonate (OS-CAL - DOSED IN MG OF ELEMENTAL CALCIUM) 1250 MG tablet Take 1 tablet by mouth daily.    cholecalciferol (VITAMIN D) 1000 UNITS tablet Take 1,000 Units by mouth daily.    Cyanocobalamin (VITAMIN B 12 PO) Take 500 mg by mouth daily.     esomeprazole (NEXIUM) 40 MG capsule Take 40 mg by mouth daily as needed. For acid reflux    hydrocortisone valerate cream (WESTCORT) 0.2 % Apply 1 application topically 2 (two) times daily as needed (itching).     MELATONIN PO Take 1 tablet by mouth at bedtime as needed (sleep).    metoprolol succinate (TOPROL-XL)  50 MG 24 hr tablet Take 50 mg by mouth daily. Take with or immediately following a meal.    sertraline (ZOLOFT) 100 MG tablet Take 100 mg by mouth daily.      STOP taking these medications     nitrofurantoin, macrocrystal-monohydrate, (MACROBID) 100 MG capsule        Allergies  Allergen Reactions  . Sulfa Antibiotics Nausea And Vomiting  . Sulfonamide Derivatives Nausea And Vomiting   Follow-up Information    Please follow up.   Why:  f/u with MD at SNF       The results of significant diagnostics from this hospitalization (including imaging, microbiology, ancillary and laboratory) are listed below for reference.    Significant Diagnostic Studies: Dg Chest 2 View  08/19/2014   CLINICAL DATA:  81 year old male with shortness of breath and  multiple falls in the past 3 weeks  EXAM: CHEST  2 VIEW  COMPARISON:  Chest radiograph dated 05/21/2014  FINDINGS: Single-view of the chest demonstrate chronic senescent changes of the lungs. No focal consolidation, pleural effusion, or pneumothorax. Stable top normal size cardiac silhouette. Atherosclerotic calcification of the aortic arch. Left hilar surgical clips noted. Degenerative changes of the osseous structures.  IMPRESSION: No acute cardiopulmonary process.   Electronically Signed   By: Anner Crete M.D.   On: 08/19/2014 21:11   Ct Head Wo Contrast  08/19/2014   CLINICAL DATA:  81 year old male with a history of fall.  EXAM: CT HEAD WITHOUT CONTRAST  CT CERVICAL SPINE WITHOUT CONTRAST  TECHNIQUE: Multidetector CT imaging of the head and cervical spine was performed following the standard protocol without intravenous contrast. Multiplanar CT image reconstructions of the cervical spine were also generated.  COMPARISON:  04/08/2014, 05/21/2014, prior MRI 04/08/2014  FINDINGS: CT HEAD FINDINGS  Unremarkable appearance of the calvarium without acute fracture or aggressive lesion.  Unremarkable appearance of the scalp soft tissues.  Unremarkable appearance of the bilateral orbits.  Mastoid air cells are clear.  Trace mucosal disease of the bilateral maxillary sinuses.  No acute intracranial hemorrhage. No midline shift or mass effect. Configuration of the ventricular system is unchanged from the comparison. Confluent hypodensity in the periventricular white matter. Gray-white differentiation is relatively maintained. Senescent brain volume loss again noted.  CT CERVICAL SPINE FINDINGS  Craniocervical junction unremarkable.  No fracture line identified.  Cervical elements maintain relative anatomic alignment. No subluxation, anterolisthesis, retrolisthesis.  Vertebral body heights relatively maintained.  Disc spaces are narrowed with endplate changes compatible with multilevel degenerative disc disease. No  significant bony canal narrowing.  Mild bilateral facet disease, more pronounced on the left.  Soft tissues of the cervical region unremarkable. Dense calcifications of bilateral carotid system.  Unremarkable appearance of the lung apices.  IMPRESSION: CT head:  No CT evidence of acute intracranial abnormality.  Re- demonstration of changes of chronic microvascular ischemic disease.  Cervical Spine:  No CT evidence of acute fracture or malalignment of the cervical spine.  Multilevel degenerative changes of the cervical spine.  Bilateral carotid calcifications.  Signed,  Dulcy Fanny. Earleen Newport, DO  [facility]  [facility]   Electronically Signed   By: Corrie Mckusick D.O.   On: 08/19/2014 20:56   Ct Cervical Spine Wo Contrast  08/19/2014   CLINICAL DATA:  81 year old male with a history of fall.  EXAM: CT HEAD WITHOUT CONTRAST  CT CERVICAL SPINE WITHOUT CONTRAST  TECHNIQUE: Multidetector CT imaging of the head and cervical spine was performed following the standard  protocol without intravenous contrast. Multiplanar CT image reconstructions of the cervical spine were also generated.  COMPARISON:  04/08/2014, 05/21/2014, prior MRI 04/08/2014  FINDINGS: CT HEAD FINDINGS  Unremarkable appearance of the calvarium without acute fracture or aggressive lesion.  Unremarkable appearance of the scalp soft tissues.  Unremarkable appearance of the bilateral orbits.  Mastoid air cells are clear.  Trace mucosal disease of the bilateral maxillary sinuses.  No acute intracranial hemorrhage. No midline shift or mass effect. Configuration of the ventricular system is unchanged from the comparison. Confluent hypodensity in the periventricular white matter. Gray-white differentiation is relatively maintained. Senescent brain volume loss again noted.  CT CERVICAL SPINE FINDINGS  Craniocervical junction unremarkable.  No fracture line identified.  Cervical elements maintain relative anatomic  alignment. No subluxation, anterolisthesis, retrolisthesis.  Vertebral body heights relatively maintained.  Disc spaces are narrowed with endplate changes compatible with multilevel degenerative disc disease. No significant bony canal narrowing.  Mild bilateral facet disease, more pronounced on the left.  Soft tissues of the cervical region unremarkable. Dense calcifications of bilateral carotid system.  Unremarkable appearance of the lung apices.  IMPRESSION: CT head:  No CT evidence of acute intracranial abnormality.  Re- demonstration of changes of chronic microvascular ischemic disease.  Cervical Spine:  No CT evidence of acute fracture or malalignment of the cervical spine.  Multilevel degenerative changes of the cervical spine.  Bilateral carotid calcifications.  Signed,  Dulcy Fanny. Earleen Newport, DO  [facility]  [facility]   Electronically Signed   By: Corrie Mckusick D.O.   On: 08/19/2014 20:56   Mr Hip Left Wo Contrast  08/20/2014   CLINICAL DATA:  Left hip pain status post fall. On Coumadin therapy. Evaluate for occult fracture or other pathology. Dementia. Initial encounter.  EXAM: MR OF THE LEFT HIP WITHOUT CONTRAST  TECHNIQUE: Multiplanar, multisequence MR imaging was performed. No intravenous contrast was administered.  COMPARISON:  Radiographs 08/19/2014.  CT 06/13/2014 and 08/30/2013.  FINDINGS: Examination is mildly motion degraded.  Bones: Both femoral heads appear normal without evidence of acute fracture, dislocation or avascular necrosis. The visualized bony pelvis appears normal. The sacroiliac joints and symphysis pubis appear normal. There are advanced degenerative changes within the lower lumbar spine with disc space loss and osteophytes. There is T2 hyperintensity within the L3-4 and L4-5 discs with adjacent endplates cystic changes, grossly unchanged from prior CT.  Articular cartilage and labrum  Articular cartilage: Mild degenerative changes are  present at both hips.  Labrum: There is no gross labral tear or paralabral abnormality.  Joint or bursal effusion  Joint effusion: A small amount of hip joint fluid bilaterally is within physiologic limits.  Bursae: There is a small amount of peritrochanteric bursal fluid on the left.  Muscles and tendons  Muscles and tendons: There is asymmetric edema posteriorly within the left gluteus maximus muscle, posterior to the proximal femur, consistent with muscular contusion. No focal fluid collection demonstrated. The gluteus and hamstring tendons appear intact.  Other findings  Miscellaneous: Bladder trabeculation and sigmoid colon diverticular changes are grossly stable. Known horseshoe kidney partially imaged.  IMPRESSION: 1. Soft tissue contusion posteriorly in the left gluteus maximus muscle with mild adjacent trochanteric bursitis. 2. No evidence of pelvic hematoma. 3. No evidence of acute fracture or dislocation. 4. Prominent endplate degenerative changes in the lower lumbar spine. There is T2 hyperintensity within the L3-4 and L4-5 discs which is probably degenerative given the stability of the adjacent endplates rather than disc space  infection.   Electronically Signed   By: Richardean Sale M.D.   On: 08/20/2014 19:58   Dg Knee Complete 4 Views Left  08/19/2014   CLINICAL DATA:  Multiple falls over the past 3 weeks. Bilateral knee pain.  EXAM: LEFT KNEE - COMPLETE 4+ VIEW  COMPARISON:  Right knee radiographs of the same day.  FINDINGS: Mild degenerative changes are present in the patellofemoral compartment. The left knee is located. No acute fracture or subluxation is evident. There is no radiopaque foreign body. Atherosclerotic calcifications are present in the femoral popliteal artery.  IMPRESSION: 1. No acute abnormality or focal lesion to explain the patient's pain. 2. Minimal degenerative changes in the patellofemoral compartment. 3. Atherosclerosis.   Electronically Signed   By: San Morelle  M.D.   On: 08/19/2014 21:12   Dg Knee Complete 4 Views Right  08/19/2014   CLINICAL DATA:  Bilateral knee pain after multiple falls in the past 3 weeks.  EXAM: RIGHT KNEE - COMPLETE 4+ VIEW  COMPARISON:  None.  FINDINGS: No fracture or dislocation. The alignment is maintained. Mild medial tibial femoral joint space narrowing. Small patellar osteophytes. No joint effusion. Vascular calcifications are seen.  IMPRESSION: Minimal osteoarthritis.  No acute osseous abnormality.   Electronically Signed   By: Jeb Levering M.D.   On: 08/19/2014 21:12   Dg Hip Unilat With Pelvis 2-3 Views Left  08/19/2014   CLINICAL DATA:  81 year old male with a history of posterior and lateral left hip pain for 2 days.  EXAM: LEFT HIP (WITH PELVIS) 2-3 VIEWS  COMPARISON:  CT 06/13/2014  FINDINGS: Bony pelvic ring appears intact.  No fracture line identified.  Diffuse osteopenia.  Multilevel degenerative disc disease of the visualized lumbar spine.  Surgical clips at the bilateral aspect of the anatomic pelvis.  Bilateral hips projects normally over the acetabula.  Visualized aspects of the proximal femurs unremarkable.  Vascular calcifications.  Left hip appears congruent.  Rounded calcific density at the left lateral aspect of the anatomic pelvis was present on the comparison, compatible with a vascular calcification.  IMPRESSION: Negative for acute bony abnormality, however, osteopenia somewhat limits evaluation for nondisplaced fractures and if there is ongoing concern, MRI may be considered.  Atherosclerosis.  Signed,  Dulcy Fanny. Earleen Newport, DO  [facility]  [facility]   Electronically Signed   By: Corrie Mckusick D.O.   On: 08/19/2014 17:12    Microbiology: Recent Results (from the past 240 hour(s))  Urine culture     Status: None   Collection Time: 08/19/14  8:20 PM  Result Value Ref Range Status   Specimen Description URINE, CLEAN CATCH  Final   Special Requests NONE  Final    Culture >=100,000 COLONIES/mL ENTEROCOCCUS FAECIUM  Final   Report Status 08/22/2014 FINAL  Final   Organism ID, Bacteria ENTEROCOCCUS FAECIUM  Final      Susceptibility   Enterococcus faecium - MIC*    AMPICILLIN 8 SENSITIVE Sensitive     LEVOFLOXACIN >=8 RESISTANT Resistant     NITROFURANTOIN 128 RESISTANT Resistant     VANCOMYCIN <=0.5 SENSITIVE Sensitive     LINEZOLID 2 SENSITIVE Sensitive     * >=100,000 COLONIES/mL ENTEROCOCCUS FAECIUM  Culture, blood (x 2)     Status: None (Preliminary result)   Collection Time: 08/20/14  1:07 AM  Result Value Ref Range Status   Specimen Description BLOOD LEFT ANTECUBITAL  Final   Special Requests BOTTLES DRAWN AEROBIC AND ANAEROBIC [place] EA  Final   Culture NO GROWTH 1 DAY  Final   Report Status PENDING  Incomplete  Culture, blood (x 2)     Status: None (Preliminary result)   Collection Time: 08/20/14  1:15 AM  Result Value Ref Range Status   Specimen Description BLOOD LEFT ANTECUBITAL  Final   Special Requests BOTTLES DRAWN AEROBIC AND ANAEROBIC [place] EA  Final   Culture NO GROWTH 1 DAY  Final   Report Status PENDING  Incomplete     Labs: Basic Metabolic Panel:  Recent Labs Lab 08/19/14 2030 08/20/14 0255 08/21/14 0425 08/22/14 0420  NA 137 139 139 142  K 4.6 3.6 4.2 4.5  CL 103 103 104 107  CO2 '25 26 25 25  '$ GLUCOSE 115* 116* 104* 98  BUN 21* 21* 16 22*  CREATININE 1.23 1.13 1.03 1.11  CALCIUM 9.6 9.4 8.9 9.1   Liver Function Tests:  Recent Labs Lab 08/19/14 2030  AST 32  ALT 10*  ALKPHOS 54  BILITOT 1.5*  PROT 7.2  ALBUMIN 3.9   No results for input(s): LIPASE, AMYLASE in the last 168 hours. No results for input(s): AMMONIA in the last 168 hours. CBC:  Recent Labs Lab 08/19/14 2030 08/20/14 0255 08/21/14 0425 08/22/14 0420  WBC 16.8* 15.6* 14.5* 12.9*  NEUTROABS 13.1*  --  11.1*  --   HGB 15.3 14.4 13.7 13.6  HCT 46.6 43.8 42.3 42.2  MCV 88.3 88.1 88.3 88.8  PLT 142* 123* 118* 110*   Cardiac  Enzymes:  Recent Labs Lab 08/19/14 2030  TROPONINI <0.03   BNP: BNP (last 3 results)  Recent Labs  08/19/14 2032  BNP 107.6*    ProBNP (last 3 results) No results for input(s): PROBNP in the last 8760 hours.  CBG: No results for input(s): GLUCAP in the last 168 hours.     SignedIrine Seal MD [facility] 08/22/2014, 11:42 AM

## 2014-08-22 NOTE — H&P (Signed)
1245 report given toLiza patrium. Nuese from [facility] /Pt transferred to [facility] by wife by private transportation . Pt wheeled by Nt

## 2014-08-22 NOTE — Progress Notes (Addendum)
Addendum:  9169:  DC today to [facility] per MD [place] Silos.   Family: [name] is aware of DC and agreeable. Called twice and spoke, message left this last time of solidified DC plan.  [facility] also agreeable.     At 1045am: [facility] had an unexpected DC and potentially working to accept patient today if at all possible (medical pending).  Family notified that this is not for certain but promising.   MD paged. [facility] to call back with definite answer. Family will be contacted once plan is known per medical.   LCSW followed up with bed offer regarding family's only choice being [facility]. Spoke with Heather at [facility], reports they are bed lock and will not have a male bed until early/late next week. Family notified via phone as there is no one in the room.  Spoke with [name] (granddaughter)  [name] has not authorized an additional search.  She is calling other family members in effort to see what they want to do.  LCSW gave options: 1. New search/other beds in [facility] 2. Home with Home health and family. 3. Home with home health and family to wait for bed at [facility].   Awaiting Lindsey's phone call back with decision.  Family aware that patient cannot wait for bed here that we are close to being medically stable.  Lane Hacker, MSW Clinical Social Work: Emergency Room [phone]

## 2014-08-22 NOTE — Patient Outreach (Signed)
[facility]) Care Management  08/22/2014  [name] April 23, 1930 [id number]   Noted patient admitted to [facility]  hospital on 6/28 and discharged to [facility] in [place] on 7/1.  Plan: Will alert Lurline Del, CMA and  Will place an order for [facility] Social Worker Referral if needed.  Joylene Draft, RN, [place] Care Management [phone]

## 2014-08-22 NOTE — Progress Notes (Signed)
Physical Therapy Treatment Patient Details Name: [name] MRN: [id number] DOB: April 03, 1930 Today's Date: 08/22/2014    History of Present Illness Patioent is an 81 y/o male admitted s/p fall with left hip pain, positive for diarrhea, but knee and hip x-rays negative for bony abnormality.  PMH positive for hypertension, hyperlipidemia, GERD, depression, atrial fibrillation not on Coumadin due to high risk of fall, dementia, history of bladder cancer 2002 (s/p of surgery and bladder reconstruction and chemotherapy), metastasized bladder cancer to lung (posterior status of surgery), stroke, diverticulitis.    PT Comments    Patient able to walk some this session and required increased time with bed mobility. Patient stated that he felt better today. Disoriented to time and situation. Continue to recommend SNF for ongoing Physical Therapy.     Follow Up Recommendations  SNF     Equipment Recommendations  None recommended by PT    Recommendations for Other Services       Precautions / Restrictions Precautions Precautions: Fall Restrictions Weight Bearing Restrictions: No    Mobility  Bed Mobility Overal bed mobility: Needs Assistance         Sit to supine: Min assist   General bed mobility comments: Min A for trunk support up into sitting. Cues for positioning  Transfers Overall transfer level: Needs assistance Equipment used: Rolling walker (2 wheeled)   Sit to Stand: +2 physical assistance;Min assist         General transfer comment: Min A +2 to power up into standing. Patient with posterior lean and weight through heels  Ambulation/Gait Ambulation/Gait assistance: +2 physical assistance;Mod assist Ambulation Distance (Feet): 12 Feet   Gait Pattern/deviations: Step-to pattern;Shuffle   Gait velocity interpretation: <1.8 ft/sec, indicative of risk for recurrent falls General Gait Details: One person in front and one in back to offer optimal support and  assist with correcting posterior lean. Cues for upright posture and increasing step length.    Stairs            Wheelchair Mobility    Modified Rankin (Stroke Patients Only)       Balance                                    Cognition Arousal/Alertness: Awake/alert Behavior During Therapy: WFL for tasks assessed/performed Overall Cognitive Status: Impaired/Different from baseline Area of Impairment: Orientation Orientation Level: Situation;Time                  Exercises      General Comments        Pertinent Vitals/Pain Pain Score: 2  Pain Location: l hip Pain Descriptors / Indicators: Sore Pain Intervention(s): Monitored during session    Home Living                      Prior Function            PT Goals (current goals can now be found in the care plan section) Progress towards PT goals: Progressing toward goals    Frequency  Min 3X/week    PT Plan Current plan remains appropriate    Co-evaluation             End of Session Equipment Utilized During Treatment: Gait belt Activity Tolerance: Patient tolerated treatment well;Patient limited by fatigue Patient left: in chair;with call bell/phone within reach     Time: 0804-0829 PT Time Calculation (min) (  ACUTE ONLY): 25 min  Charges:  $Gait Training: 8-22 mins $Therapeutic Activity: 8-22 mins                    G Codes:      Jacqualyn Posey 08/22/2014, 10:23 AM 08/22/2014 Jacqualyn Posey PTA [phone] pager [phone] office

## 2014-08-22 NOTE — Progress Notes (Signed)
Patient blood pressure checked at 3276 was 147W systolic. Recheck at 0610 was 160/93 --- will continue to monitor.

## 2014-08-24 DIAGNOSIS — N390 Urinary tract infection, site not specified: Secondary | ICD-10-CM | POA: Diagnosis not present

## 2014-08-24 DIAGNOSIS — R739 Hyperglycemia, unspecified: Secondary | ICD-10-CM | POA: Diagnosis not present

## 2014-08-24 DIAGNOSIS — I480 Paroxysmal atrial fibrillation: Secondary | ICD-10-CM | POA: Diagnosis not present

## 2014-08-24 DIAGNOSIS — I10 Essential (primary) hypertension: Secondary | ICD-10-CM | POA: Diagnosis not present

## 2014-08-24 DIAGNOSIS — N319 Neuromuscular dysfunction of bladder, unspecified: Secondary | ICD-10-CM | POA: Diagnosis not present

## 2014-08-24 DIAGNOSIS — M6281 Muscle weakness (generalized): Secondary | ICD-10-CM | POA: Diagnosis not present

## 2014-08-24 DIAGNOSIS — R2689 Other abnormalities of gait and mobility: Secondary | ICD-10-CM | POA: Diagnosis not present

## 2014-08-24 DIAGNOSIS — E440 Moderate protein-calorie malnutrition: Secondary | ICD-10-CM | POA: Diagnosis not present

## 2014-08-25 LAB — CULTURE, BLOOD (ROUTINE X 2)
CULTURE: NO GROWTH
Culture: NO GROWTH

## 2014-08-28 ENCOUNTER — Encounter: Payer: Self-pay | Admitting: *Deleted

## 2014-08-28 DIAGNOSIS — Z029 Encounter for administrative examinations, unspecified: Secondary | ICD-10-CM

## 2014-09-01 ENCOUNTER — Other Ambulatory Visit: Payer: Self-pay | Admitting: *Deleted

## 2014-09-01 NOTE — Patient Outreach (Signed)
[place] [facility] Care Management  09/01/2014  [name] [birth year] [id number]   Phone call to patient's granddaughter with suggestion that she contact/ arrange in home aid care for patient before his return home from the skilled nursing facility.  Resource list provided previously  provided.  Patient's granddaughter stated that she would make some phone calls to discuss in home aid services for patient upon his return home.    Sheralyn Boatman [facility] Care Management [phone]

## 2014-09-02 ENCOUNTER — Encounter: Payer: Self-pay | Admitting: Hematology and Oncology

## 2014-09-02 ENCOUNTER — Other Ambulatory Visit: Payer: Medicare Other

## 2014-09-02 ENCOUNTER — Encounter: Payer: Self-pay | Admitting: *Deleted

## 2014-09-02 ENCOUNTER — Ambulatory Visit: Payer: Medicare Other | Admitting: Hematology and Oncology

## 2014-09-02 NOTE — Progress Notes (Signed)
Letter from Dr. Alvy Bimler today regarding no show for appt was placed in outgoing mail to pt's home address listed in chart.

## 2014-09-03 ENCOUNTER — Encounter: Payer: Self-pay | Admitting: Gastroenterology

## 2014-09-03 ENCOUNTER — Ambulatory Visit (INDEPENDENT_AMBULATORY_CARE_PROVIDER_SITE_OTHER): Payer: Medicare Other | Admitting: Gastroenterology

## 2014-09-03 VITALS — BP 102/66 | HR 80 | Ht 72.0 in | Wt 203.5 lb

## 2014-09-03 DIAGNOSIS — R933 Abnormal findings on diagnostic imaging of other parts of digestive tract: Secondary | ICD-10-CM | POA: Diagnosis not present

## 2014-09-03 DIAGNOSIS — K219 Gastro-esophageal reflux disease without esophagitis: Secondary | ICD-10-CM | POA: Diagnosis not present

## 2014-09-03 NOTE — Progress Notes (Signed)
    History of Present Illness: This is an 81 year old male referred by Franchot Gallo, MD for the evaluation of an abnormal CT scan of the colon. Patient is accompanied by his granddaughter. He is currently residing in [place] home following a hospitalization. Plans are for him to return home in a few weeks. Abdominal/pelvic CT scan performed 06/13/2014 shows inflammatory changes around the ascending colon compatible with diverticulitis diverticulosis involving the sigmoid and descending colon. Abdominal/pelvic CT scan performed 04/28/2009 also showed inflammatory changes around the descending colon. Patient does not relate a history of diverticulitis. He denies any episodes of abdominal pain. He does have significant memory deficits. He states he underwent colonoscopy by Dr. Collene Mares about 10 years ago but these unsure about the dates. He has chronic problem with loose stools but this bowel pattern has not changed for years. Denies weight loss, abdominal pain, constipation, diarrhea, change in stool caliber, melena, hematochezia, nausea, vomiting, dysphagia, reflux symptoms, chest pain.   Review of Systems: Pertinent positive and negative review of systems were noted in the above HPI section. All other review of systems were otherwise negative.  Current Medications, Allergies, Past Medical History, Past Surgical History, Family History and Social History were reviewed in [employer] record.  Physical Exam: General: Well developed, well nourished debilitated, in a wheelchair, no acute distress Head: Normocephalic and atraumatic Eyes:  sclerae anicteric, EOMI Ears: Normal auditory acuity Mouth: No deformity or lesions Neck: Supple, no masses or thyromegaly Lungs: Clear throughout to auscultation Heart: Regular rate and rhythm; no murmurs, rubs or bruits Abdomen: Soft, non tender and non distended. No masses, hepatosplenomegaly or hernias noted. Normal Bowel  sounds Rectal: deferred to colonoscopy Musculoskeletal: Symmetrical with no gross deformities  Skin: No lesions on visible extremities Pulses:  Normal pulses noted Extremities: No clubbing, cyanosis, edema or deformities noted Neurological: Alert oriented x 4, grossly nonfocal Cervical Nodes:  No significant cervical adenopathy Inguinal Nodes: No significant inguinal adenopathy Psychological:  Alert and cooperative. Normal mood and affect  Assessment and Recommendations:  1. Abnormal CT scan of the colon. Findings of inflammatory changes in the descending colon suggestive of diverticulitis without typical symptoms. Sigmoid and descending colon diverticulosis noted. Rule out segmental colitis, neoplasms and other disorders. I recommended proceeding with colonoscopy or air-contrast barium enema. Patient would like to more fully recover physically before scheduling any testing. Patient granddaughter states that he is likely be discharged from the nursing home within the next few weeks. He is not willing to undergo additional testing at this point. Return office visit in 4-6 weeks.  2. GERD. Continue Nexium 40 mg daily and standard antireflux measures.  cc: Franchot Gallo, MD [address] [place], [place] [address]

## 2014-09-03 NOTE — Patient Instructions (Addendum)
Your follow up appointment is with Dr. Fuller Plan on 10/17/14 at 11:15am.  Thank you for choosing me and [facility].  Pricilla Riffle. Dagoberto Ligas., MD., Marval Regal  cc: Lutricia Feil, MD

## 2014-09-04 ENCOUNTER — Encounter: Payer: Self-pay | Admitting: *Deleted

## 2014-09-04 NOTE — Patient Outreach (Signed)
[place] [facility]) Care Management  09/04/2014  [name] 15-Jan-1931 [id number]   Involvement letter routed to patient's primary care doctor.    Sheralyn Boatman [facility] Care Management [phone]

## 2014-09-07 DIAGNOSIS — R2689 Other abnormalities of gait and mobility: Secondary | ICD-10-CM | POA: Diagnosis not present

## 2014-09-07 DIAGNOSIS — I10 Essential (primary) hypertension: Secondary | ICD-10-CM | POA: Diagnosis not present

## 2014-09-07 DIAGNOSIS — F331 Major depressive disorder, recurrent, moderate: Secondary | ICD-10-CM | POA: Diagnosis not present

## 2014-09-08 ENCOUNTER — Encounter: Payer: Self-pay | Admitting: *Deleted

## 2014-09-08 DIAGNOSIS — Z8551 Personal history of malignant neoplasm of bladder: Secondary | ICD-10-CM | POA: Diagnosis not present

## 2014-09-10 ENCOUNTER — Ambulatory Visit: Payer: Medicare Other | Admitting: *Deleted

## 2014-09-16 DIAGNOSIS — G629 Polyneuropathy, unspecified: Secondary | ICD-10-CM | POA: Diagnosis not present

## 2014-09-16 DIAGNOSIS — I4891 Unspecified atrial fibrillation: Secondary | ICD-10-CM | POA: Diagnosis not present

## 2014-09-16 DIAGNOSIS — K219 Gastro-esophageal reflux disease without esophagitis: Secondary | ICD-10-CM | POA: Diagnosis not present

## 2014-09-16 DIAGNOSIS — F329 Major depressive disorder, single episode, unspecified: Secondary | ICD-10-CM | POA: Diagnosis not present

## 2014-09-16 DIAGNOSIS — R296 Repeated falls: Secondary | ICD-10-CM | POA: Diagnosis not present

## 2014-09-16 DIAGNOSIS — I10 Essential (primary) hypertension: Secondary | ICD-10-CM | POA: Diagnosis not present

## 2014-09-17 DIAGNOSIS — N390 Urinary tract infection, site not specified: Secondary | ICD-10-CM | POA: Diagnosis not present

## 2014-09-18 ENCOUNTER — Other Ambulatory Visit: Payer: Self-pay | Admitting: *Deleted

## 2014-09-18 DIAGNOSIS — I4891 Unspecified atrial fibrillation: Secondary | ICD-10-CM | POA: Diagnosis not present

## 2014-09-18 DIAGNOSIS — F329 Major depressive disorder, single episode, unspecified: Secondary | ICD-10-CM | POA: Diagnosis not present

## 2014-09-18 DIAGNOSIS — I10 Essential (primary) hypertension: Secondary | ICD-10-CM | POA: Diagnosis not present

## 2014-09-18 DIAGNOSIS — K219 Gastro-esophageal reflux disease without esophagitis: Secondary | ICD-10-CM | POA: Diagnosis not present

## 2014-09-18 DIAGNOSIS — R296 Repeated falls: Secondary | ICD-10-CM | POA: Diagnosis not present

## 2014-09-18 DIAGNOSIS — G629 Polyneuropathy, unspecified: Secondary | ICD-10-CM | POA: Diagnosis not present

## 2014-09-18 NOTE — Patient Outreach (Signed)
[place] [facility] Care Management  09/18/2014  [name] 10-22-1930 [id number]   Telephone Outreach Call Telephone call to determine if [name] had been discharged from [facility] in [place], spoke with [name] Profitt his granddaughter, she states that he was discharged on July 22 and has had follow up appointment with Dr.Shaw on yesterday.  [name] reports that [name] is doing better, he is staying in basement apartment area of her home as before. He is receiving home health Physical therapy by  [facility] . [name] reports that [name] is able to take all of his medication as ordered, she unable to review each medication at this time. [name] is preparing for a vacation trip and [name] wife will be staying with him during this time, [name] has arranged for home services by [facility] 9 am to 1 pm daily.  Denies falls since at home, no other problems or concerns voiced at this time.  Plan: Schedule initial home visit on Tuesday, August 2 at 1000.

## 2014-09-23 ENCOUNTER — Other Ambulatory Visit: Payer: Self-pay | Admitting: *Deleted

## 2014-09-23 DIAGNOSIS — K219 Gastro-esophageal reflux disease without esophagitis: Secondary | ICD-10-CM | POA: Diagnosis not present

## 2014-09-23 DIAGNOSIS — F329 Major depressive disorder, single episode, unspecified: Secondary | ICD-10-CM | POA: Diagnosis not present

## 2014-09-23 DIAGNOSIS — R296 Repeated falls: Secondary | ICD-10-CM | POA: Diagnosis not present

## 2014-09-23 DIAGNOSIS — G629 Polyneuropathy, unspecified: Secondary | ICD-10-CM | POA: Diagnosis not present

## 2014-09-23 DIAGNOSIS — I10 Essential (primary) hypertension: Secondary | ICD-10-CM | POA: Diagnosis not present

## 2014-09-23 DIAGNOSIS — I4891 Unspecified atrial fibrillation: Secondary | ICD-10-CM | POA: Diagnosis not present

## 2014-09-23 NOTE — Patient Outreach (Signed)
[facility] [facility]) Care Management   09/23/2014  [name] August 02, 1930 [id number]  [name] is an 81 y.o. male  Initial Home visit for transition of care  Subjective: [name] states that he is glad to be at home and being happy that his wife is here. Patient reports that his grand daughter [name] is on vacation and this wife is staying with him. [name] reports that the patient continues to have falls, is last fall occurred on 7/31, she reports that most falls seem to occur in the bathroom she reported that her has a scratch on his right arm, and a bruise on his left upper hip area, she reports not change in his walking, and [name] denies pain.  Objective:   ROS   Review of Systems  Constitutional: Negative.  HENT: Negative.  Respiratory: Negative.  Cardiovascular: Negative.  Gastrointestinal: Negative.  Genitourinary: Negative.  Musculoskeletal: Positive for falls.  Skin: Negative.  Psychiatric/Behavioral: Positive for memory loss.   Physical Exam  Alert and oriented person place and time Respiratory :Bilateral breath sounds clear Heart sounds: Regular Noted band aid to right lower arm "reported scratch" at area Musculoskeletal : Unsteady gait, noted shuffling movement of feet at times  BP 124/70 mmHg  Pulse 67  Resp 18  SpO2 96%  Current Medications:   Current Outpatient Prescriptions  Medication Sig Dispense Refill  . ALPHA LIPOIC ACID PO Take 200-300 mg by mouth daily.    Marland Kitchen ascorbic acid (VITAMIN C) 1000 MG tablet Take 500 mg by mouth daily.     Marland Kitchen aspirin 81 MG chewable tablet Chew 81 mg by mouth daily.    Marland Kitchen atorvastatin (LIPITOR) 10 MG tablet Take 10 mg by mouth daily.    Marland Kitchen buPROPion (WELLBUTRIN) 75 MG tablet Take 75 mg by mouth daily. daily    . calcium carbonate (OS-CAL - DOSED IN MG OF ELEMENTAL CALCIUM) 1250 MG tablet Take 1 tablet by mouth daily.    . cholecalciferol (VITAMIN D) 1000 UNITS tablet  Take 1,000 Units by mouth daily.    . Cyanocobalamin (VITAMIN B 12 PO) Take 500 mg by mouth daily.     . hydrocortisone valerate cream (WESTCORT) 0.2 % Apply 1 application topically 2 (two) times daily as needed (itching).     . MELATONIN PO Take 1 tablet by mouth at bedtime as needed (sleep).    . metoprolol succinate (TOPROL-XL) 50 MG 24 hr tablet Take 50 mg by mouth daily. Take with or immediately following a meal.    . nitrofurantoin (MACRODANTIN) 100 MG capsule Take 1 capsule (100 mg total) by mouth daily. Resume in 1 week.    Marland Kitchen omeprazole (PRILOSEC) 20 MG capsule Take 20 mg by mouth 2 (two) times daily before a meal.    . Probiotic Product (PROBIOTIC DAILY PO) Take 460 mg by mouth daily.    . sertraline (ZOLOFT) 100 MG tablet Take 100 mg by mouth daily.    Marland Kitchen amoxicillin-clavulanate (AUGMENTIN) 875-125 MG per tablet Take 1 tablet by mouth every 12 (twelve) hours. Take for 7 days then stop. (Patient not taking: Reported on 09/23/2014) 14 tablet 0  . ibuprofen (ADVIL,MOTRIN) 400 MG tablet Take 1 tablet (400 mg total) by mouth every 6 (six) hours as needed. Take 3 times daily for 3 days, then use every 6 hours as needed. (Patient not taking: Reported on 09/23/2014) 30 tablet 0  . oxyCODONE-acetaminophen (PERCOCET/ROXICET) 5-325 MG per tablet Take 1 tablet by mouth every 6 (six) hours  as needed for moderate pain. (Patient not taking: Reported on 09/23/2014) 15 tablet 0   No current facility-administered medications for this visit.    Functional Status:   In your present state of health, do you have any difficulty performing the following activities: 09/23/2014 08/21/2014  Hearing? Y N  Vision? N Y  Difficulty concentrating or making decisions? [name]  Walking or climbing stairs? Y Y  Dressing or bathing? Y Y  Doing errands, shopping? [name]  Preparing Food and eating ? Y -  Using the Toilet? N -  In the past six months, have you accidently leaked urine? Y -  Do you have problems with loss of bowel  control? N -  Managing your Medications? Y -  Managing your Finances? Y -  Housekeeping or managing your Housekeeping? Y -    Fall/Depression Screening:    PHQ 2/9 Scores 09/23/2014 06/18/2014 06/09/2014  PHQ - 2 Score 0 0 -  Exception Documentation - - Patient refusal    Assessment:  [name] continues to have falls at home. [name] reports that she has been working with the patient on assisting him  to the bathroom and walking with him. He is still being followed by [name] Physical therapy and has an appointment for this morning as well. During my visit and assisting patient with ambulation he still needs cueing for proper use of walker when getting up from the chair, staying up close in the walker when ambulating and proper steps when sitting back down in the chair. [name] reports that [name] is managing his daily bladder irrigation along with twice daily self catheterizations that he has been doing for a few years.[name] is also managing his daily medication that are  in pill boxes marked morning and evening.  [name], grand daughter that he resides with  has arranged for [facility] sitter to stay with the patient a few hours during the day starting on 8/9. No further concerns voiced per family,I  Have reviewed my contact information and encouraged to call with concerns.   Plan: Continue with transition of care Review care plan goals and interventions Fax today's visit note to Dr. Raul Hall office, and letter of continued involvement  [facility] CM Care Plan Problem One        Patient Outreach Telephone from 09/23/2014 in [place] Problem One  Frequent Hall Admissions related to Urinary Tract Infections   Care Plan for Problem One  Active   [facility] Long Term Goal (31-90 days)  Patient will not experience hospitalization related to UTI in the next 31 days   [facility] Long Term Goal Start Date  09/23/14   Interventions for  Problem One Long Term Goal  Reviewed early signs of urinary tract infection in the elderly, change in mental status, encouraged to notify MD upon early signs   [facility]N CM Short Term Goal #1 (0-30 days)  Caregivers will be able to state  2 early signs and symptoms of Urinary tract infection and actions to take   [facility] CM Short Term Goal #1 Start Date  09/23/14   Interventions for Short Term Goal #1  Reviewed signs and symptoms of UTI,sudden and severe confusion,strong smelling urine,increase in falling ,   THN CM Short Term Goal #2 (0-30 days)  Caregivers will be able to report Urinary tract prevention measures   [facility] CM Short Term Goal #2 Start Date  09/23/14   Interventions for Short Term Goal #2  Discussed importance of reminding  to try and empty the  bladder every 4 hours, changing incontinence briefs frequently,with a goal of keeping dry to reduce risk for UTI     [facility] CM Care Plan Problem Two        Patient Outreach Telephone from 09/23/2014 in [place] Problem Two  Frequent [place] for Problem Two  Active   Interventions for Problem Two Long Term Goal   Reviewed importance of adhering to fall prevention measures, keep supportive shoes on or nonslip socks on , discussed with family present to provide assistance in bathroom as allowed.   [facility]N Long Term Goal (31-90) days  Patient will be able to report no falls with injury for next 31 days   [facility] Long Term Goal Start Date  09/18/14 Barrie Folk date unmet , restarted]   [facility] CM Short Term Goal #1 (0-30 days)  Patient will utiilize walker during ambulation at all  times   [place] [facility] CM Short Term Goal #1 Start Date  09/18/14   Interventions for Short Term Goal #2   Discussed importance of keeping walker close,patient demonstrate how to use walker when getting up and sitting back down in chair   [facility]N CM Short Term Goal #2 (0-30 days)  Patient will be able to report a walking routine in home at least 3 days per week for the next 30 days     [facility]N CM Short Term Goal #2 Start Date  09/23/14   Interventions for Short Term Goal #2  Discusssed importance of daily activity,     [facility] CM Care Plan Problem Three        Patient Outreach Telephone from 09/01/2014 in [place] Problem Three  patient needs a in home aid   [place] for Problem Three  Active   [facility]N Long Term Goal (31-90) days  patient will have in home aid post discharge from the skilled nrsing facility   [place] Start Date  07/03/14   Interventions for Problem Three Long Term Goal  list of personal care agencies provided to patient's granddaughter to contact      Joylene Draft, [place], [place] Care Management [phone]

## 2014-09-24 ENCOUNTER — Encounter: Payer: Self-pay | Admitting: *Deleted

## 2014-09-25 DIAGNOSIS — F329 Major depressive disorder, single episode, unspecified: Secondary | ICD-10-CM | POA: Diagnosis not present

## 2014-09-25 DIAGNOSIS — I10 Essential (primary) hypertension: Secondary | ICD-10-CM | POA: Diagnosis not present

## 2014-09-25 DIAGNOSIS — I4891 Unspecified atrial fibrillation: Secondary | ICD-10-CM | POA: Diagnosis not present

## 2014-09-25 DIAGNOSIS — G629 Polyneuropathy, unspecified: Secondary | ICD-10-CM | POA: Diagnosis not present

## 2014-09-25 DIAGNOSIS — R296 Repeated falls: Secondary | ICD-10-CM | POA: Diagnosis not present

## 2014-09-25 DIAGNOSIS — K219 Gastro-esophageal reflux disease without esophagitis: Secondary | ICD-10-CM | POA: Diagnosis not present

## 2014-09-26 ENCOUNTER — Encounter: Payer: Self-pay | Admitting: *Deleted

## 2014-09-26 NOTE — Patient Outreach (Signed)
[place] [facility]) Care Management  09/26/2014  MCCALL LOMAX 1930/04/22 [id number]   Patient's chart reviewed in EPIC.  Patient referred to this social worker for assistance with referrals for custodial care.  RNCM confirmed that patient is receiving personal care services through [facility] from 9-1pm daily.   Plan: Patient's goal met regarding in home assistance being arranged.  Case to be closed to social work at this time.    Sheralyn Boatman [facility] Care Management [phone]

## 2014-09-30 ENCOUNTER — Other Ambulatory Visit: Payer: Self-pay | Admitting: *Deleted

## 2014-09-30 DIAGNOSIS — R296 Repeated falls: Secondary | ICD-10-CM | POA: Diagnosis not present

## 2014-09-30 DIAGNOSIS — K219 Gastro-esophageal reflux disease without esophagitis: Secondary | ICD-10-CM | POA: Diagnosis not present

## 2014-09-30 DIAGNOSIS — I10 Essential (primary) hypertension: Secondary | ICD-10-CM | POA: Diagnosis not present

## 2014-09-30 DIAGNOSIS — I4891 Unspecified atrial fibrillation: Secondary | ICD-10-CM | POA: Diagnosis not present

## 2014-09-30 DIAGNOSIS — F329 Major depressive disorder, single episode, unspecified: Secondary | ICD-10-CM | POA: Diagnosis not present

## 2014-09-30 DIAGNOSIS — G629 Polyneuropathy, unspecified: Secondary | ICD-10-CM | POA: Diagnosis not present

## 2014-09-30 NOTE — Patient Outreach (Signed)
[name]) Care Management  09/30/2014  [name] August 10, 1930 [id number]  Phone call to patient's granddaughter to confirm that personal care services were in place.  Per grand daughter, services started yesterday and will be  In place from 9-1 pm Monday - Friday.  Case to be closed to social work at this time.  Per granddaughter no further social work needs noted.    Sheralyn Boatman [facility] Care Management [phone]

## 2014-10-01 ENCOUNTER — Other Ambulatory Visit: Payer: Self-pay | Admitting: *Deleted

## 2014-10-01 NOTE — Patient Outreach (Signed)
[place] [facility]) Care Management  10/01/2014  JEHU MCCAUSLIN Aug 14, 1930 [id number]   Transition of Care 3 Telephone call to [facility] caregiver to Mr.Virgen, she reports that the patient has decreased the number of falls that he has, his most recent fall was 8/8, she reports that it occurred during the night on his way to the bathroom, he fell against the wall then to the floor, scraping his elbow, no other injury noted.Mr. Ziemann has a bedside commode but states he has declined to use it. Encouraged to call MD with any suspected injury or symptom when the patient has a fall. Mr.Leidy had completed his course of [facility]. [name] reports that the personal care services through [facility] started on 8/8  they will provide services for the patient Monday - Friday 9A - 1PM and they will continue to work with Mr.Colver on the daily exercise and walk with him daily.  Next scheduled transition of care call is 8/16. Reviewed my contact information and  reminded to call RN with concerns.  Joylene Draft, RN, [place] Care Management [phone]

## 2014-10-07 ENCOUNTER — Other Ambulatory Visit: Payer: Self-pay | Admitting: *Deleted

## 2014-10-08 NOTE — Patient Outreach (Signed)
[facility]) Care Management  10/07/2014  [name] Dec 18, 1930 [id number]   Transition of Care Call #4  Subjective: Spoke with grand daughter [name] that [name] lives with, she reports that the patient had a fall 2 nights ago, she reports that he had a scrape on his left arm and he complained of left hip discomfort, she reported that she gave him ibuprofen every 6 hours for the last 2 days. [name] Profitt also reports that Mr.Stairs has been able to continue his daily walks with [employer] personal care assistance without any noted problems. [name] verbalized understanding to call MD with concerns after a fall.  [name] reports that [name] continues to perform twice daily self-catheterizations ,and after his recent urology office visit he began doing daily irrigations, initially his ex-wife was here to assist him.  [name] voiced concern regarding patient technique, she states this am that she found irrigation supplies in container with his razor.  [name] report that they have [name] on a schedule during the day of going to the bathroom and emptying his bladder and reports that he stays much drier now. Caregiver denies noting any new signs UTI, no foul odor to urine, mental status stable.  Assessment: Concern regarding [name] technique in performing daily bladder irrigation, he may benefit from short term home health RN to teach him , I informed [name] that I  will place a  call to [facility] to notify MD of concern.  Plan  Will call Dr. Raul Del office to notify that the  patient continues to have falls at home,I was able to leave a message on the nurse line, Claiborne Billings to inform regarding [name] falls. Telephone call to [facility], I spoke with Triage RN, and explained concern regarding bladder irrigations Follow up home visit scheduled.  [facility] CM Care Plan Problem One        Patient  Outreach Telephone from 10/07/2014 in [place] Problem One  Frequent hospital Admissions related to Urinary Tract Infections   Care Plan for Problem One  Active   [facility] Long Term Goal (31-90 days)  Patient will not experience hospitalization related to UTI in the next 31 days   [facility] Long Term Goal Start Date  09/23/14   Interventions for Problem One Long Term Goal  Reviewed early signs of urinary tract infection in the elderly, change in mental status, encouraged to notify MD upon early signs [no new signs of UTI ]   [facility] CM Short Term Goal #1 (0-30 days)  Caregivers will be able to state  2 early signs and symptoms of Urinary tract infection and actions to take   [facility] CM Short Term Goal #1 Start Date  09/23/14   [facility] CM Short Term Goal #1 Met Date  10/07/14   [facility] CM Short Term Goal #2 (0-30 days)  Caregivers will be able to report Urinary tract prevention measures   [facility] CM Short Term Goal #2 Start Date  09/23/14   Interventions for Short Term Goal #2  Reinforced to continue intervention of every 4 hours of going to bathroom during the day, Call to urology office regarding concern of proper bladder irrigation technique,and need for further education    [facility] CM Care Plan Problem Two        Patient Outreach Telephone from 10/07/2014 in [place] Problem Two  Frequent [place] for Problem Two  Active   Interventions for Problem Two Long Term Goal   Reviewed importance of adhering to fall prevention measures, keep supportive shoes on or nonslip socks on , discussed with family present to provide assistance in bathroom as allowed.   [facility] Long Term Goal (31-90) days  Patient will be able to report no falls with injury for next 31 days   [facility] Long Term Goal Start Date  09/23/14 [goal date restarted]   [facility] CM Short Term Goal #1 (0-30 days)  Patient will utiilize walker during ambulation at all  times   [facility] CM Short Term Goal #1 Start Date  09/18/14    [facility] CM Short Term Goal #1 Met Date   10/07/14   [facility] CM Short Term Goal #2 (0-30 days)  Patient will be able to report a walking routine in home at least 3 days per week for the next 30 days    [facility] CM Short Term Goal #2 Start Date  09/23/14   [facility] CM Short Term Goal #2 Met Date  10/07/14 Linus Mako with Personal care assistant daily]    [facility] Problem Three        Patient Outreach Telephone from 09/30/2014 in [place] Problem Three  patient needs a in home aid   [place] for Problem Three  Not Active   [facility] Long Term Goal (31-90) days  patient will have in home assiststance post discharge from the skilled nursing facility   [place] Start Date  07/03/14   [facility] Long Term Goal Met Date  09/29/14    Joylene Draft, [place], [place] Care Management [phone]

## 2014-10-10 ENCOUNTER — Other Ambulatory Visit: Payer: Self-pay | Admitting: *Deleted

## 2014-10-10 NOTE — Patient Outreach (Signed)
[facility]) Care Management  10/10/2014  JUDSON TSAN [birth year] [id number]  Unsuccessful attempt to reach Mr.Sponsel grand daughter  [name] to follow up regarding communication with Urology office about daily bladder irrigation care for Mr.Habermann.  I Will attempt again on 8/22.  Joylene Draft, RN, [place] Care Management [phone]

## 2014-10-11 DIAGNOSIS — R296 Repeated falls: Secondary | ICD-10-CM | POA: Diagnosis not present

## 2014-10-11 DIAGNOSIS — F329 Major depressive disorder, single episode, unspecified: Secondary | ICD-10-CM | POA: Diagnosis not present

## 2014-10-11 DIAGNOSIS — N390 Urinary tract infection, site not specified: Secondary | ICD-10-CM | POA: Diagnosis not present

## 2014-10-11 DIAGNOSIS — I480 Paroxysmal atrial fibrillation: Secondary | ICD-10-CM | POA: Diagnosis not present

## 2014-10-13 DIAGNOSIS — N390 Urinary tract infection, site not specified: Secondary | ICD-10-CM | POA: Diagnosis not present

## 2014-10-14 ENCOUNTER — Other Ambulatory Visit: Payer: Self-pay | Admitting: *Deleted

## 2014-10-14 NOTE — Patient Outreach (Signed)
[facility]) Care Management  10/14/2014  [name] 1930-10-08 [id number]  Unsuccessful telephone outreach to [name] grand daughter, [name], regarding follow up with urology office will try again at a later time  Joylene Draft, [place], [place] Care Management [phone].

## 2014-10-15 ENCOUNTER — Other Ambulatory Visit: Payer: Self-pay | Admitting: *Deleted

## 2014-10-15 NOTE — Patient Outreach (Signed)
[facility]) Care Management  10/15/2014  ALEXIOS KEOWN 1930/08/24 [id number]  Telephone call to [name], grand daughter and caregiver of Mr.Dashiel Rech to follow regarding her communication with his urology office about patients daily bladder irrigation. [name] reports that she was able to talk with a nurse from the office and she was able to talk her through the process and provide information on the cleaning technique. During the conversation [name] stated that Mr. Heggs has another UTI,  he started acting different on Sunday, she contacted the urology office, took a urine sample to office, Mr.Kettering has started taking an antibiotic,culture results on the specimen is still pending. [name] reports that Mr.Keasling has been staying in the bed most of the day, on today with the help of this personal care assist they get him up, for his meals, morning catheterization, then he wants to go back to bed. She also reports decreased appetite, states he does not have a temperature.Discussed concern regarding dehydration,and need to make PCP aware of his condition, and arrange office visit. I notified Dr.Shaw office, was only able to leave a message on  his assistants line Claiborne Billings, describing report from his grand daughter and need for office visit. Ernestene Mention to encourage Mr.Bhola to drink fluids as tolerated, and follow up with MD  if his condition worsens seek emergency help.  Joylene Draft, RN, [place] Care Management [phone]

## 2014-10-17 ENCOUNTER — Ambulatory Visit: Payer: Medicare Other | Admitting: Gastroenterology

## 2014-10-20 DIAGNOSIS — N390 Urinary tract infection, site not specified: Secondary | ICD-10-CM | POA: Diagnosis not present

## 2014-10-20 DIAGNOSIS — Z8551 Personal history of malignant neoplasm of bladder: Secondary | ICD-10-CM | POA: Diagnosis not present

## 2014-10-21 ENCOUNTER — Ambulatory Visit: Payer: Medicare Other | Admitting: Gastroenterology

## 2014-10-24 ENCOUNTER — Encounter (HOSPITAL_COMMUNITY): Payer: Self-pay | Admitting: *Deleted

## 2014-10-24 ENCOUNTER — Inpatient Hospital Stay (HOSPITAL_COMMUNITY)
Admission: EM | Admit: 2014-10-24 | Discharge: 2014-10-27 | DRG: 689 | Disposition: A | Discharge status: Home Health | Payer: Medicare Other | Attending: Internal Medicine | Admitting: Internal Medicine

## 2014-10-24 DIAGNOSIS — Z23 Encounter for immunization: Secondary | ICD-10-CM

## 2014-10-24 DIAGNOSIS — Z452 Encounter for adjustment and management of vascular access device: Secondary | ICD-10-CM | POA: Diagnosis not present

## 2014-10-24 DIAGNOSIS — Z823 Family history of stroke: Secondary | ICD-10-CM | POA: Diagnosis not present

## 2014-10-24 DIAGNOSIS — G9340 Encephalopathy, unspecified: Secondary | ICD-10-CM | POA: Diagnosis present

## 2014-10-24 DIAGNOSIS — Z809 Family history of malignant neoplasm, unspecified: Secondary | ICD-10-CM | POA: Diagnosis not present

## 2014-10-24 DIAGNOSIS — N390 Urinary tract infection, site not specified: Principal | ICD-10-CM | POA: Diagnosis present

## 2014-10-24 DIAGNOSIS — F329 Major depressive disorder, single episode, unspecified: Secondary | ICD-10-CM | POA: Diagnosis present

## 2014-10-24 DIAGNOSIS — F0390 Unspecified dementia without behavioral disturbance: Secondary | ICD-10-CM | POA: Diagnosis present

## 2014-10-24 DIAGNOSIS — Z9842 Cataract extraction status, left eye: Secondary | ICD-10-CM

## 2014-10-24 DIAGNOSIS — N179 Acute kidney failure, unspecified: Secondary | ICD-10-CM | POA: Diagnosis present

## 2014-10-24 DIAGNOSIS — E785 Hyperlipidemia, unspecified: Secondary | ICD-10-CM | POA: Diagnosis present

## 2014-10-24 DIAGNOSIS — Z66 Do not resuscitate: Secondary | ICD-10-CM | POA: Diagnosis present

## 2014-10-24 DIAGNOSIS — Z79899 Other long term (current) drug therapy: Secondary | ICD-10-CM | POA: Diagnosis not present

## 2014-10-24 DIAGNOSIS — Z87891 Personal history of nicotine dependence: Secondary | ICD-10-CM | POA: Diagnosis not present

## 2014-10-24 DIAGNOSIS — B952 Enterococcus as the cause of diseases classified elsewhere: Secondary | ICD-10-CM | POA: Diagnosis present

## 2014-10-24 DIAGNOSIS — Z882 Allergy status to sulfonamides status: Secondary | ICD-10-CM

## 2014-10-24 DIAGNOSIS — R410 Disorientation, unspecified: Secondary | ICD-10-CM | POA: Diagnosis not present

## 2014-10-24 DIAGNOSIS — Z8551 Personal history of malignant neoplasm of bladder: Secondary | ICD-10-CM

## 2014-10-24 DIAGNOSIS — Z85118 Personal history of other malignant neoplasm of bronchus and lung: Secondary | ICD-10-CM

## 2014-10-24 DIAGNOSIS — I10 Essential (primary) hypertension: Secondary | ICD-10-CM | POA: Diagnosis present

## 2014-10-24 DIAGNOSIS — I4891 Unspecified atrial fibrillation: Secondary | ICD-10-CM | POA: Diagnosis present

## 2014-10-24 DIAGNOSIS — Z7982 Long term (current) use of aspirin: Secondary | ICD-10-CM | POA: Diagnosis not present

## 2014-10-24 DIAGNOSIS — Z9841 Cataract extraction status, right eye: Secondary | ICD-10-CM | POA: Diagnosis not present

## 2014-10-24 DIAGNOSIS — Z87440 Personal history of urinary (tract) infections: Secondary | ICD-10-CM | POA: Diagnosis not present

## 2014-10-24 LAB — I-STAT CHEM 8, ED
BUN: 31 mg/dL — ABNORMAL HIGH (ref 6–20)
CALCIUM ION: 1.22 mmol/L (ref 1.13–1.30)
CHLORIDE: 104 mmol/L (ref 101–111)
CREATININE: 1.3 mg/dL — AB (ref 0.61–1.24)
GLUCOSE: 101 mg/dL — AB (ref 65–99)
HCT: 48 % (ref 39.0–52.0)
Hemoglobin: 16.3 g/dL (ref 13.0–17.0)
POTASSIUM: 3.8 mmol/L (ref 3.5–5.1)
Sodium: 141 mmol/L (ref 135–145)
TCO2: 24 mmol/L (ref 0–100)

## 2014-10-24 LAB — CBC WITH DIFFERENTIAL/PLATELET
Basophils Absolute: 0 10*3/uL (ref 0.0–0.1)
Basophils Relative: 0 % (ref 0–1)
EOS ABS: 0.3 10*3/uL (ref 0.0–0.7)
EOS PCT: 2 % (ref 0–5)
HCT: 46.4 % (ref 39.0–52.0)
Hemoglobin: 14.4 g/dL (ref 13.0–17.0)
LYMPHS PCT: 11 % — AB (ref 12–46)
Lymphs Abs: 1.2 10*3/uL (ref 0.7–4.0)
MCH: 29.1 pg (ref 26.0–34.0)
MCHC: 31 g/dL (ref 30.0–36.0)
MCV: 93.7 fL (ref 78.0–100.0)
MONOS PCT: 9 % (ref 3–12)
Monocytes Absolute: 0.9 10*3/uL (ref 0.1–1.0)
Neutro Abs: 8.2 10*3/uL — ABNORMAL HIGH (ref 1.7–7.7)
Neutrophils Relative %: 78 % — ABNORMAL HIGH (ref 43–77)
PLATELETS: 212 10*3/uL (ref 150–400)
RBC: 4.95 MIL/uL (ref 4.22–5.81)
RDW: 15.2 % (ref 11.5–15.5)
WBC: 10.6 10*3/uL — ABNORMAL HIGH (ref 4.0–10.5)

## 2014-10-24 LAB — URINALYSIS, ROUTINE W REFLEX MICROSCOPIC
Bilirubin Urine: NEGATIVE
Glucose, UA: NEGATIVE mg/dL
Ketones, ur: NEGATIVE mg/dL
Nitrite: NEGATIVE
PH: 6 (ref 5.0–8.0)
Protein, ur: 30 mg/dL — AB
Specific Gravity, Urine: 1.017 (ref 1.005–1.030)
Urobilinogen, UA: 0.2 mg/dL (ref 0.0–1.0)

## 2014-10-24 LAB — BASIC METABOLIC PANEL
Anion gap: 9 (ref 5–15)
BUN: 28 mg/dL — AB (ref 6–20)
CHLORIDE: 106 mmol/L (ref 101–111)
CO2: 27 mmol/L (ref 22–32)
CREATININE: 1.35 mg/dL — AB (ref 0.61–1.24)
Calcium: 9.6 mg/dL (ref 8.9–10.3)
GFR calc Af Amer: 55 mL/min — ABNORMAL LOW (ref >60)
GFR, EST NON AFRICAN AMERICAN: 47 mL/min — AB (ref >60)
Glucose, Bld: 104 mg/dL — ABNORMAL HIGH (ref 65–99)
POTASSIUM: 3.8 mmol/L (ref 3.5–5.1)
SODIUM: 142 mmol/L (ref 135–145)

## 2014-10-24 LAB — URINE MICROSCOPIC-ADD ON

## 2014-10-24 MED ORDER — VANCOMYCIN HCL IN DEXTROSE 750-5 MG/150ML-% IV SOLN
750.0000 mg | Freq: Two times a day (BID) | INTRAVENOUS | Status: DC
  Administered 2014-10-25 – 2014-10-27 (×5): 750 mg via INTRAVENOUS
  Filled 2014-10-24 (×7): qty 150

## 2014-10-24 MED ORDER — METOPROLOL SUCCINATE ER 25 MG PO TB24
25.0000 mg | ORAL_TABLET | Freq: Every day | ORAL | Status: DC
  Administered 2014-10-25 – 2014-10-27 (×3): 25 mg via ORAL
  Filled 2014-10-24 (×3): qty 1

## 2014-10-24 MED ORDER — HYDROCODONE-ACETAMINOPHEN 5-325 MG PO TABS: 1.0000 | ORAL_TABLET | ORAL | Status: DC | PRN

## 2014-10-24 MED ORDER — BUPROPION HCL 75 MG PO TABS
75.0000 mg | ORAL_TABLET | Freq: Every morning | ORAL | Status: DC
  Administered 2014-10-25 – 2014-10-27 (×3): 75 mg via ORAL
  Filled 2014-10-24 (×3): qty 1

## 2014-10-24 MED ORDER — VITAMIN C 500 MG PO TABS
1000.0000 mg | ORAL_TABLET | Freq: Every morning | ORAL | Status: DC
  Administered 2014-10-25 – 2014-10-27 (×3): 1000 mg via ORAL
  Filled 2014-10-24 (×3): qty 2

## 2014-10-24 MED ORDER — VITAMIN B 12 100 MCG PO LOZG: 500.0000 mg | LOZENGE | Freq: Every morning | ORAL | Status: DC

## 2014-10-24 MED ORDER — VANCOMYCIN HCL 10 G IV SOLR
2000.0000 mg | Freq: Once | INTRAVENOUS | Status: AC
  Administered 2014-10-24: 2000 mg via INTRAVENOUS
  Filled 2014-10-24: qty 2000

## 2014-10-24 MED ORDER — SODIUM CHLORIDE 0.9 % IV SOLN
INTRAVENOUS | Status: DC
  Administered 2014-10-24: 18:00:00 via INTRAVENOUS

## 2014-10-24 MED ORDER — ASPIRIN 81 MG PO CHEW
81.0000 mg | CHEWABLE_TABLET | Freq: Every morning | ORAL | Status: DC
  Administered 2014-10-25 – 2014-10-27 (×3): 81 mg via ORAL
  Filled 2014-10-24 (×3): qty 1

## 2014-10-24 MED ORDER — ALPHA LIPOIC ACID 200 MG PO CAPS: 200.0000 mg | ORAL_CAPSULE | Freq: Every morning | ORAL | Status: DC

## 2014-10-24 MED ORDER — MORPHINE SULFATE (PF) 2 MG/ML IV SOLN: 1.0000 mg | INTRAVENOUS | Status: DC | PRN

## 2014-10-24 MED ORDER — PROBIOTIC DAILY PO CAPS: ORAL_CAPSULE | Freq: Every morning | ORAL | Status: DC

## 2014-10-24 MED ORDER — INFLUENZA VAC SPLIT QUAD 0.5 ML IM SUSY
0.5000 mL | PREFILLED_SYRINGE | INTRAMUSCULAR | Status: AC
  Administered 2014-10-27: 0.5 mL via INTRAMUSCULAR
  Filled 2014-10-24 (×3): qty 0.5

## 2014-10-24 MED ORDER — CALCIUM CARBONATE 1250 (500 CA) MG PO TABS
1.0000 | ORAL_TABLET | Freq: Every day | ORAL | Status: DC
  Administered 2014-10-25 – 2014-10-27 (×3): 500 mg via ORAL
  Filled 2014-10-24 (×3): qty 1

## 2014-10-24 MED ORDER — ACETAMINOPHEN 650 MG RE SUPP: 650.0000 mg | Freq: Four times a day (QID) | RECTAL | Status: DC | PRN

## 2014-10-24 MED ORDER — CYANOCOBALAMIN 500 MCG PO TABS
500.0000 ug | ORAL_TABLET | Freq: Every day | ORAL | Status: DC
  Administered 2014-10-25 – 2014-10-27 (×3): 500 ug via ORAL
  Filled 2014-10-24 (×3): qty 1

## 2014-10-24 MED ORDER — ACETAMINOPHEN 325 MG PO TABS: 650.0000 mg | ORAL_TABLET | Freq: Four times a day (QID) | ORAL | Status: DC | PRN

## 2014-10-24 MED ORDER — ONDANSETRON HCL 4 MG PO TABS
4.0000 mg | ORAL_TABLET | Freq: Four times a day (QID) | ORAL | Status: DC | PRN
Start: 2014-10-24 — End: 2014-10-27

## 2014-10-24 MED ORDER — SACCHAROMYCES BOULARDII 250 MG PO CAPS
250.0000 mg | ORAL_CAPSULE | Freq: Two times a day (BID) | ORAL | Status: DC
  Administered 2014-10-24 – 2014-10-27 (×6): 250 mg via ORAL
  Filled 2014-10-24 (×6): qty 1

## 2014-10-24 MED ORDER — HYDROCORTISONE VALERATE 0.2 % EX CREA
1.0000 "application " | TOPICAL_CREAM | Freq: Two times a day (BID) | CUTANEOUS | Status: DC | PRN
  Filled 2014-10-24: qty 15

## 2014-10-24 MED ORDER — SERTRALINE HCL 100 MG PO TABS
100.0000 mg | ORAL_TABLET | Freq: Every day | ORAL | Status: DC
  Administered 2014-10-24 – 2014-10-27 (×4): 100 mg via ORAL
  Filled 2014-10-24 (×4): qty 1

## 2014-10-24 MED ORDER — ATORVASTATIN CALCIUM 10 MG PO TABS
10.0000 mg | ORAL_TABLET | Freq: Every day | ORAL | Status: DC
  Administered 2014-10-24 – 2014-10-26 (×3): 10 mg via ORAL
  Filled 2014-10-24 (×3): qty 1

## 2014-10-24 MED ORDER — ENOXAPARIN SODIUM 40 MG/0.4ML ~~LOC~~ SOLN
40.0000 mg | SUBCUTANEOUS | Status: DC
  Administered 2014-10-24 – 2014-10-27 (×4): 40 mg via SUBCUTANEOUS
  Filled 2014-10-24 (×4): qty 0.4

## 2014-10-24 MED ORDER — ONDANSETRON HCL 4 MG/2ML IJ SOLN: 4.0000 mg | Freq: Four times a day (QID) | INTRAMUSCULAR | Status: DC | PRN

## 2014-10-24 MED ORDER — VITAMIN D 1000 UNITS PO TABS
1000.0000 [IU] | ORAL_TABLET | Freq: Every morning | ORAL | Status: DC
  Administered 2014-10-25 – 2014-10-27 (×3): 1000 [IU] via ORAL
  Filled 2014-10-24 (×3): qty 1

## 2014-10-24 NOTE — Progress Notes (Signed)
PHARMACIST - PHYSICIAN ORDER COMMUNICATION  CONCERNING: P&T Medication Policy on Herbal Medications  DESCRIPTION:  This patient's order for: Alpha lipoic acid has been noted.  This product(s) is classified as an "herbal" or natural product. Due to a lack of definitive safety studies or FDA approval, nonstandard manufacturing practices, plus the potential risk of unknown drug-drug interactions while on inpatient medications, the Pharmacy and Therapeutics Committee does not permit the use of "herbal" or natural products of this type within [facility].   ACTION TAKEN: The pharmacy department is unable to verify this order at this time and your patient has been informed of this safety policy. Please reevaluate patient's clinical condition at discharge and address if the herbal or natural product(s) should be resumed at that time.

## 2014-10-24 NOTE — ED Notes (Signed)
Nurse drawing labs. 

## 2014-10-24 NOTE — ED Provider Notes (Signed)
CSN: [id number]     Arrival date & time 10/24/14  1230 History   First MD Initiated Contact with Patient 10/24/14 1239     Chief Complaint  Patient presents with  . Urinary Tract Infection     (Consider location/radiation/quality/duration/timing/severity/associated sxs/prior Treatment) Patient is a 81 y.o. male presenting with general illness. The history is provided by the patient.  Illness Severity:  Moderate Onset quality:  Sudden Duration:  2 hours Timing:  Constant Progression:  Worsening Chronicity:  New Associated symptoms: no abdominal pain, no chest pain, no congestion, no diarrhea, no fever, no headaches, no myalgias, no rash, no shortness of breath and no vomiting    81yo M with a chief complaint of mild confusion and some tremors and some weakness at home. Per family this is consistent with his urinary tract infections. He was just treated 10 days with Macrobid without relief. Saw a urologist yesterday and had a urine that was culture positive for enterococcus. This is resistant to everything except for linezolid and vancomycin. He has been told to come here for admission.  Past Medical History  Diagnosis Date  . Atrial fibrillation   . Hyperlipidemia   . Hypertension   . UTI (urinary tract infection)   . Dementia   . Bladder cancer     hx of surgery and chemo  . Lung cancer     Status post surgery resection  . Diverticulitis   . Depression 08/19/2014   Past Surgical History  Procedure Laterality Date  . Hernia repair  2006  . Cataract extraction, bilateral  1998, 2000  . Cystectomy  2002  . Lung removal, partial     Family History  Problem Relation Age of Onset  . Cancer Maternal Aunt     unknown  . Cancer Maternal Grandmother     unknown  . Aneurysm Brother   . Stroke Father     cerebral hemorrhage   Social History  Substance Use Topics  . Smoking status: Former Smoker    Types: Cigarettes    Quit date: 10/10/1991  . Smokeless tobacco: Never Used   . Alcohol Use: No    Review of Systems  Constitutional: Negative for fever and chills.  HENT: Negative for congestion and facial swelling.   Eyes: Negative for discharge and visual disturbance.  Respiratory: Negative for shortness of breath.   Cardiovascular: Negative for chest pain and palpitations.  Gastrointestinal: Negative for vomiting, abdominal pain and diarrhea.  Musculoskeletal: Negative for myalgias and arthralgias.  Skin: Negative for color change and rash.  Neurological: Negative for tremors, syncope and headaches.  Psychiatric/Behavioral: Negative for confusion and dysphoric mood.      Allergies  Sulfa antibiotics and Sulfonamide derivatives  Home Medications   Prior to Admission medications   Medication Sig Start Date End Date Taking? Authorizing Provider  ALPHA LIPOIC ACID PO Take 200-300 mg by mouth every morning.    Yes Historical Provider, MD  ascorbic acid (VITAMIN C) 1000 MG tablet Take 1,000 mg by mouth every morning.    Yes Historical Provider, MD  aspirin 81 MG chewable tablet Chew 81 mg by mouth every morning.    Yes Historical Provider, MD  atorvastatin (LIPITOR) 10 MG tablet Take 10 mg by mouth at bedtime.    Yes Historical Provider, MD  buPROPion (WELLBUTRIN) 75 MG tablet Take 75 mg by mouth every morning. daily   Yes Historical Provider, MD  calcium carbonate (OS-CAL - DOSED IN MG OF ELEMENTAL CALCIUM) 1250 MG tablet  Take 1 tablet by mouth daily with breakfast.    Yes Historical Provider, MD  cholecalciferol (VITAMIN D) 1000 UNITS tablet Take 1,000 Units by mouth every morning.    Yes Historical Provider, MD  Cyanocobalamin (VITAMIN B 12 PO) Take 500 mg by mouth every morning.    Yes Historical Provider, MD  esomeprazole (NEXIUM) 40 MG capsule Take 40 mg by mouth daily as needed (reflux.).   Yes Historical Provider, MD  hydrocortisone valerate cream (WESTCORT) 0.2 % Apply 1 application topically 2 (two) times daily as needed (itching).    Yes Historical  Provider, MD  MELATONIN PO Take 1 tablet by mouth at bedtime as needed (sleep).   Yes Historical Provider, MD  methenamine (HIPREX) 1 G tablet Take 1 tablet by mouth 3 (three) times daily.  10/20/14  Yes Historical Provider, MD  metoprolol succinate (TOPROL-XL) 25 MG 24 hr tablet Take 25 mg by mouth daily.   Yes Historical Provider, MD  nitrofurantoin (MACRODANTIN) 100 MG capsule Take 1 capsule (100 mg total) by mouth daily. Resume in 1 week. Patient taking differently: Take 100 mg by mouth at bedtime. Resume in 1 week. 08/30/14  Yes Eugenie Filler, MD  Probiotic Product (PROBIOTIC DAILY PO) Take 460 mg by mouth every morning.    Yes Historical Provider, MD  sertraline (ZOLOFT) 100 MG tablet Take 100 mg by mouth daily.   Yes Historical Provider, MD  amoxicillin-clavulanate (AUGMENTIN) 875-125 MG per tablet Take 1 tablet by mouth every 12 (twelve) hours. Take for 7 days then stop. Patient not taking: Reported on 09/23/2014 08/22/14   Eugenie Filler, MD  cephALEXin (KEFLEX) 500 MG capsule Take 1 capsule by mouth 2 (two) times daily. For 10 days, starting 10/13/14. 10/13/14   Historical Provider, MD  ibuprofen (ADVIL,MOTRIN) 400 MG tablet Take 1 tablet (400 mg total) by mouth every 6 (six) hours as needed. Take 3 times daily for 3 days, then use every 6 hours as needed. Patient not taking: Reported on 10/24/2014 08/22/14   Eugenie Filler, MD   BP 124/72 mmHg  Pulse 67  Temp(Src) 98.2 F (36.8 C) (Oral)  Ht '6\' 1"'$  (1.854 m)  Wt 205 lb (92.987 kg)  BMI 27.05 kg/m2  SpO2 93% Physical Exam  Constitutional: He appears well-developed and well-nourished.  HENT:  Head: Normocephalic and atraumatic.  Eyes: EOM are normal. Pupils are equal, round, and reactive to light.  Neck: Normal range of motion. Neck supple. No JVD present.  Cardiovascular: Normal rate and regular rhythm.  Exam reveals no gallop and no friction rub.   No murmur heard. Pulmonary/Chest: No respiratory distress. He has no wheezes. He  has no rales.  Abdominal: He exhibits no distension. There is no tenderness. There is no rebound and no guarding.  Musculoskeletal: Normal range of motion.  Neurological: He is alert. GCS eye subscore is 4. GCS verbal subscore is 4. GCS motor subscore is 6.  Skin: No rash noted. No pallor.  Psychiatric: He has a normal mood and affect. His behavior is normal.    ED Course  Procedures (including critical care time) Labs Review Labs Reviewed  CBC WITH DIFFERENTIAL/PLATELET - Abnormal; Notable for the following:    WBC 10.6 (*)    Neutrophils Relative % 78 (*)    Neutro Abs 8.2 (*)    Lymphocytes Relative 11 (*)    All other components within normal limits  BASIC METABOLIC PANEL - Abnormal; Notable for the following:    Glucose, Bld 104 (*)  BUN 28 (*)    Creatinine, Ser 1.35 (*)    GFR calc non Af Amer 47 (*)    GFR calc Af Amer 55 (*)    All other components within normal limits  URINALYSIS, ROUTINE W REFLEX MICROSCOPIC (NOT AT [facility]) - Abnormal; Notable for the following:    APPearance TURBID (*)    Hgb urine dipstick LARGE (*)    Protein, ur 30 (*)    Leukocytes, UA LARGE (*)    All other components within normal limits  URINE MICROSCOPIC-ADD ON - Abnormal; Notable for the following:    Bacteria, UA MANY (*)    All other components within normal limits  I-STAT CHEM 8, ED - Abnormal; Notable for the following:    BUN 31 (*)    Creatinine, Ser 1.30 (*)    Glucose, Bld 101 (*)    All other components within normal limits  URINE CULTURE    Imaging Review No results found. I have personally reviewed and evaluated these images and lab results as part of my medical decision-making.   EKG Interpretation None      MDM   Final diagnoses:  UTI (lower urinary tract infection)    81 yo M given 20/kg of vancomycin. Lab work unremarkable. Admit to hospital.  The patients results and plan were reviewed and discussed.   Any x-rays performed were independently reviewed by  myself.   Differential diagnosis were considered with the presenting HPI.  Medications  vancomycin (VANCOCIN) IVPB 750 mg/150 ml premix (not administered)  Influenza vac split quadrivalent PF (FLUARIX) injection 0.5 mL (not administered)  vancomycin (VANCOCIN) 2,000 mg in sodium chloride 0.9 % 500 mL IVPB (2,000 mg Intravenous New Bag/Given 10/24/14 1345)    Filed Vitals:   10/24/14 1246 10/24/14 1435  BP: 124/72   Pulse: 67   Temp: 98.2 F (36.8 C)   TempSrc: Oral   Height:  '6\' 1"'$  (1.854 m)  Weight:  205 lb (92.987 kg)  SpO2: 93%     Final diagnoses:  UTI (lower urinary tract infection)    Admission/ observation were discussed with the admitting physician, patient and/or family and they are comfortable with the plan.      Deno Etienne, DO 10/24/14 1635

## 2014-10-24 NOTE — H&P (Signed)
[facility] History and Physical  [name] LOV:[id number] DOB: 06/10/1930 DOA: 10/24/2014  Referring physician: Dr Tyrone Nine PCP: [name], MD   Chief Complaint: Confusion  HPI: [name] is a 81 y.o. male with PMH significant for recurrent UTI, bladder cancer S/P surgery and Chemo, who presents for treatment of UTI. Patient was notice to be confuse, unsteady gait by granddaughter. He usually has an UTI when he has those symptoms. He was evaluated by his urologist. He was prescribe oral antibiotics. Subsequently urine culture grew enterococcus only sensitive to vancomycin and linezolid. Per granddaughter insurance wont pay for the medications, it is 2000, and they are not able to afford it.    Review of Systems:  Negative, except as per HPI   Past Medical History  Diagnosis Date  . Atrial fibrillation   . Hyperlipidemia   . Hypertension   . UTI (urinary tract infection)   . Dementia   . Bladder cancer     hx of surgery and chemo  . Lung cancer     Status post surgery resection  . Diverticulitis   . Depression 08/19/2014   Past Surgical History  Procedure Laterality Date  . Hernia repair  2006  . Cataract extraction, bilateral  1998, 2000  . Cystectomy  2002  . Lung removal, partial     Social History:  reports that he quit smoking about 23 years ago. His smoking use included Cigarettes. He has never used smokeless tobacco. He reports that he does not drink alcohol or use illicit drugs.  Allergies  Allergen Reactions  . Sulfa Antibiotics Nausea And Vomiting  . Sulfonamide Derivatives Nausea And Vomiting    Family History  Problem Relation Age of Onset  . Cancer Maternal Aunt     unknown  . Cancer Maternal Grandmother     unknown  . Aneurysm Brother   . Stroke Father     cerebral hemorrhage    Prior to Admission medications   Medication Sig Start Date End Date Taking? Authorizing Provider  ALPHA LIPOIC ACID PO Take 200-300 mg by mouth  every morning.    Yes Historical Provider, MD  ascorbic acid (VITAMIN C) 1000 MG tablet Take 1,000 mg by mouth every morning.    Yes Historical Provider, MD  aspirin 81 MG chewable tablet Chew 81 mg by mouth every morning.    Yes Historical Provider, MD  atorvastatin (LIPITOR) 10 MG tablet Take 10 mg by mouth at bedtime.    Yes Historical Provider, MD  buPROPion (WELLBUTRIN) 75 MG tablet Take 75 mg by mouth every morning. daily   Yes Historical Provider, MD  calcium carbonate (OS-CAL - DOSED IN MG OF ELEMENTAL CALCIUM) 1250 MG tablet Take 1 tablet by mouth daily with breakfast.    Yes Historical Provider, MD  cholecalciferol (VITAMIN D) 1000 UNITS tablet Take 1,000 Units by mouth every morning.    Yes Historical Provider, MD  Cyanocobalamin (VITAMIN B 12 PO) Take 500 mg by mouth every morning.    Yes Historical Provider, MD  esomeprazole (NEXIUM) 40 MG capsule Take 40 mg by mouth daily as needed (reflux.).   Yes Historical Provider, MD  hydrocortisone valerate cream (WESTCORT) 0.2 % Apply 1 application topically 2 (two) times daily as needed (itching).    Yes Historical Provider, MD  MELATONIN PO Take 1 tablet by mouth at bedtime as needed (sleep).   Yes Historical Provider, MD  methenamine (HIPREX) 1 G tablet Take 1 tablet by mouth 3 (three) times  daily.  10/20/14  Yes Historical Provider, MD  metoprolol succinate (TOPROL-XL) 25 MG 24 hr tablet Take 25 mg by mouth daily.   Yes Historical Provider, MD  nitrofurantoin (MACRODANTIN) 100 MG capsule Take 1 capsule (100 mg total) by mouth daily. Resume in 1 week. Patient taking differently: Take 100 mg by mouth at bedtime. Resume in 1 week. 08/30/14  Yes Eugenie Filler, MD  Probiotic Product (PROBIOTIC DAILY PO) Take 460 mg by mouth every morning.    Yes Historical Provider, MD  sertraline (ZOLOFT) 100 MG tablet Take 100 mg by mouth daily.   Yes Historical Provider, MD  amoxicillin-clavulanate (AUGMENTIN) 875-125 MG per tablet Take 1 tablet by mouth  every 12 (twelve) hours. Take for 7 days then stop. Patient not taking: Reported on 09/23/2014 08/22/14   Eugenie Filler, MD  cephALEXin (KEFLEX) 500 MG capsule Take 1 capsule by mouth 2 (two) times daily. For 10 days, starting 10/13/14. 10/13/14   Historical Provider, MD  ibuprofen (ADVIL,MOTRIN) 400 MG tablet Take 1 tablet (400 mg total) by mouth every 6 (six) hours as needed. Take 3 times daily for 3 days, then use every 6 hours as needed. Patient not taking: Reported on 10/24/2014 08/22/14   Eugenie Filler, MD   Physical Exam: Filed Vitals:   10/24/14 1246 10/24/14 1435  BP: 124/72   Pulse: 67   Temp: 98.2 F (36.8 C)   TempSrc: Oral   Height:  '6\' 1"'$  (1.854 m)  Weight:  92.987 kg (205 lb)  SpO2: 93%     Wt Readings from Last 3 Encounters:  10/24/14 92.987 kg (205 lb)  09/03/14 92.307 kg (203 lb 8 oz)  08/22/14 90.8 kg (200 lb 2.8 oz)    General:  Appears calm and comfortable Eyes: PERRL, normal lids, irises & conjunctiva ENT: grossly normal hearing, lips & tongue Neck: no LAD, masses or thyromegaly Cardiovascular: RRR, no m/r/g. No LE edema. Respiratory: CTA bilaterally, no w/r/r. Normal respiratory effort. Abdomen: soft, ntnd Skin: no rash or induration seen on limited exam Musculoskeletal: grossly normal tone BUE/BLE Psychiatric: grossly normal mood and affect, speech fluent and appropriate Neurologic: grossly non-focal.          Labs on Admission:  Basic Metabolic Panel:  Recent Labs Lab 10/24/14 1326 10/24/14 1346  NA 142 141  K 3.8 3.8  CL 106 104  CO2 27  --   GLUCOSE 104* 101*  BUN 28* 31*  CREATININE 1.35* 1.30*  CALCIUM 9.6  --    Liver Function Tests: No results for input(s): AST, ALT, ALKPHOS, BILITOT, PROT, ALBUMIN in the last 168 hours. No results for input(s): LIPASE, AMYLASE in the last 168 hours. No results for input(s): AMMONIA in the last 168 hours. CBC:  Recent Labs Lab 10/24/14 1326 10/24/14 1346  WBC 10.6*  --   NEUTROABS 8.2*  --    HGB 14.4 16.3  HCT 46.4 48.0  MCV 93.7  --   PLT 212  --    Cardiac Enzymes: No results for input(s): CKTOTAL, CKMB, CKMBINDEX, TROPONINI in the last 168 hours.  BNP (last 3 results)  Recent Labs  08/19/14 2032  BNP 107.6*    ProBNP (last 3 results) No results for input(s): PROBNP in the last 8760 hours.  CBG: No results for input(s): GLUCAP in the last 168 hours.  Radiological Exams on Admission: No results found.  EKG: none available.   Assessment/Plan Active Problems:   UTI (lower urinary tract infection)   1-UTI;  Urine culture from urologist office grew Enterococcus, sensitive to vancomycin and Linezolid. Patient insurance would not pay for linezolid.  Admit to hospital for IV vancomycin. Might need 5 to 7 days treatments due to history of recurrent UTI. Will consult CM to see if insurance will pay for medications./   2-AKI; mildly increase cr, hemoconcentration. Start IV fluids.   3-Depression; continue with Wellbutrin and Zoloft.  4-HTN; continue with metoprolol.    Code Status: DNR, discussed with grandaughter DVT Prophylaxis: Lovenox Family Communication: Care discussed with granddaughter who was at bedside.  Disposition Plan: expect 2 to 3 days inpatient.   Time spent: 75 minutes.   Niel Hummer A [facility] Pager [phone]

## 2014-10-24 NOTE — ED Notes (Signed)
Bed: EX61 Expected date:  Expected time:  Means of arrival:  Comments: EMS- 81yo M, UTI

## 2014-10-24 NOTE — Progress Notes (Signed)
ANTIBIOTIC CONSULT NOTE - INITIAL  Pharmacy Consult for Vancomycin Indication: UTI  Allergies  Allergen Reactions  . Sulfa Antibiotics Nausea And Vomiting  . Sulfonamide Derivatives Nausea And Vomiting    Patient Measurements:   Last weight 92.3 kg (08/2014)  Vital Signs: Temp: 98.2 F (36.8 C) (09/02 1246) Temp Source: Oral (09/02 1246) BP: 124/72 mmHg (09/02 1246) Pulse Rate: 67 (09/02 1246) Intake/Output from previous day:   Intake/Output from this shift:    Labs:  Recent Labs  10/24/14 1326 10/24/14 1346  WBC 10.6*  --   HGB 14.4 16.3  PLT 212  --   CREATININE 1.35* 1.30*   Estimated Creatinine Clearance: 49.5 mL/min (by C-G formula based on Cr of 1.3). No results for input(s): VANCOTROUGH, VANCOPEAK, VANCORANDOM, GENTTROUGH, GENTPEAK, GENTRANDOM, TOBRATROUGH, TOBRAPEAK, TOBRARND, AMIKACINPEAK, AMIKACINTROU, AMIKACIN in the last 72 hours.   Microbiology: No results found for this or any previous visit (from the past 720 hour(s)).  Medical History: Past Medical History  Diagnosis Date  . Atrial fibrillation   . Hyperlipidemia   . Hypertension   . UTI (urinary tract infection)   . Dementia   . Bladder cancer     hx of surgery and chemo  . Lung cancer     Status post surgery resection  . Diverticulitis   . Depression 08/19/2014    Medications:  Anti-infectives    Start     Dose/Rate Route Frequency Ordered Stop   10/24/14 1300  vancomycin (VANCOCIN) 2,000 mg in sodium chloride 0.9 % 500 mL IVPB     2,000 mg 250 mL/hr over 120 Minutes Intravenous  Once 10/24/14 1257       Assessment: [name] presented to ED on 9/2 per Dr. Diona Fanti recommendation for UTI not responding to outpatient treatment with Macrobid.  Urine culture from Urology office with Enterococcus sensitive to Linezolid and Vanc, but insurance company declined to cover outpatient Linezolid.  Urine culture (08/19/14) grew > 100,000 Enterococcus Faecium (sens: Ampicillin, Linezolid, and Vanc)  and Urine culture (05/21/14) grew > 100,000 Pseudomonas Aeruginosa (pan sens to all tested).  He lives with family at home, has history of dementia with increased falls recently.  Vanc 2g IV ordered per MD and given in ED, then pharmacy is consulted for Vanc dosing.  9/2 >> Vanc >>   Today, 10/24/2014:  Afebrile  WBC 10.6  SCr 1.3 with CrCl ~ 57  Urine culture pending.   Goal of Therapy:  Vancomycin trough level 15-20 mcg/ml Appropriate abx dosing, eradication of infection.   Plan:   Vancomycin 2g IV x1 then '750mg'$  IV q12h - start maintenance dosing > 12 hrs due to large loading dose.  Measure Vanc trough at steady state.  Follow up renal fxn, culture results, and clinical course.   Gretta Arab PharmD, BCPS Pager [phone] 10/24/2014 2:45 PM

## 2014-10-24 NOTE — ED Notes (Addendum)
Pt has UTI for 10 days with Macrobid. Pt has had no improvement, and increased falls over the past week. Dr. Jacinto Halim reccommended pt come to ED to be put on different antibiotic. Pt has hx of dementia. Pt lives with family at home.

## 2014-10-25 LAB — CBC
HCT: 44.7 % (ref 39.0–52.0)
Hemoglobin: 13.8 g/dL (ref 13.0–17.0)
MCH: 29.1 pg (ref 26.0–34.0)
MCHC: 30.9 g/dL (ref 30.0–36.0)
MCV: 94.1 fL (ref 78.0–100.0)
PLATELETS: 176 10*3/uL (ref 150–400)
RBC: 4.75 MIL/uL (ref 4.22–5.81)
RDW: 15.2 % (ref 11.5–15.5)
WBC: 10.8 10*3/uL — AB (ref 4.0–10.5)

## 2014-10-25 LAB — BASIC METABOLIC PANEL
Anion gap: 7 (ref 5–15)
BUN: 27 mg/dL — AB (ref 6–20)
CHLORIDE: 106 mmol/L (ref 101–111)
CO2: 25 mmol/L (ref 22–32)
CREATININE: 1.28 mg/dL — AB (ref 0.61–1.24)
Calcium: 8.9 mg/dL (ref 8.9–10.3)
GFR, EST AFRICAN AMERICAN: 58 mL/min — AB (ref >60)
GFR, EST NON AFRICAN AMERICAN: 50 mL/min — AB (ref >60)
Glucose, Bld: 93 mg/dL (ref 65–99)
Potassium: 3.8 mmol/L (ref 3.5–5.1)
SODIUM: 138 mmol/L (ref 135–145)

## 2014-10-25 NOTE — Progress Notes (Signed)
[facility] PROGRESS NOTE  [name] BSJ:[id number] DOB: January 13, 1931 DOA: 10/24/2014 PCP: [name], MD  Assessment/Plan: 1-UTI, recurrent, enterococcus sensitive to linezolid and vancomycin  Continue with IV vancomycin.  Care management consulted. If insurance wont cover linezolid. Will plan for Pic line and 7 to 10 days of IV antibiotics.   2-AKI; mildly increase cr, hemoconcentration. Improved with IV fluids. Decrease IV fluids.   3-Depression; continue with Wellbutrin and Zoloft.   4-HTN; continue with metoprolol.   5-Encephalopathy: improved. Related to UTI./    Code Status: DNR Family Communication: care discussed with granddaughter.  Disposition Plan: remain inpatient   Consultants:  none  Procedures:  none  Antibiotics:  Vancomycin   HPI/Subjective: Feeling better. No abdominal pain. Alert and oriented,.   Objective: Filed Vitals:   10/25/14 1332  BP: 141/98  Pulse: 70  Temp: 97.3 F (36.3 C)  Resp: 16   No intake or output data in the 24 hours ending 10/25/14 1430 Filed Weights   10/24/14 1435 10/24/14 1657  Weight: 92.987 kg (205 lb) 92.987 kg (205 lb)    Exam:   General:  Alert in no distress.   Cardiovascular: S 1, S 2 RRR  Respiratory: CTA  Abdomen: BS present, soft.   Musculoskeletal: no edema  Data Reviewed: Basic Metabolic Panel:  Recent Labs Lab 10/24/14 1326 10/24/14 1346 10/25/14 0444  NA 142 141 138  K 3.8 3.8 3.8  CL 106 104 106  CO2 27  --  25  GLUCOSE 104* 101* 93  BUN 28* 31* 27*  CREATININE 1.35* 1.30* 1.28*  CALCIUM 9.6  --  8.9   Liver Function Tests: No results for input(s): AST, ALT, ALKPHOS, BILITOT, PROT, ALBUMIN in the last 168 hours. No results for input(s): LIPASE, AMYLASE in the last 168 hours. No results for input(s): AMMONIA in the last 168 hours. CBC:  Recent Labs Lab 10/24/14 1326 10/24/14 1346 10/25/14 0444  WBC 10.6*  --  10.8*  NEUTROABS 8.2*  --   --   HGB 14.4  16.3 13.8  HCT 46.4 48.0 44.7  MCV 93.7  --  94.1  PLT 212  --  176   Cardiac Enzymes: No results for input(s): CKTOTAL, CKMB, CKMBINDEX, TROPONINI in the last 168 hours. BNP (last 3 results)  Recent Labs  08/19/14 2032  BNP 107.6*    ProBNP (last 3 results) No results for input(s): PROBNP in the last 8760 hours.  CBG: No results for input(s): GLUCAP in the last 168 hours.  Recent Results (from the past 240 hour(s))  Urine culture     Status: None (Preliminary result)   Collection Time: 10/24/14  2:32 PM  Result Value Ref Range Status   Specimen Description URINE, CATHETERIZED  Final   Special Requests NONE  Final   Culture   Final    >=100,000 COLONIES/mL ENTEROCOCCUS SPECIES Performed at [facility]    Report Status PENDING  Incomplete     Studies: No results found.  Scheduled Meds: . aspirin  81 mg Oral q morning - 10a  . atorvastatin  10 mg Oral QHS  . buPROPion  75 mg Oral q morning - 10a  . calcium carbonate  1 tablet Oral Q breakfast  . cholecalciferol  1,000 Units Oral q morning - 10a  . vitamin B-12  500 mcg Oral Daily  . enoxaparin (LOVENOX) injection  40 mg Subcutaneous Q24H  . Influenza vac split quadrivalent PF  0.5 mL Intramuscular Tomorrow-1000  . metoprolol  succinate  25 mg Oral Daily  . saccharomyces boulardii  250 mg Oral BID  . sertraline  100 mg Oral Daily  . vancomycin  750 mg Intravenous Q12H  . ascorbic acid  1,000 mg Oral q morning - 10a   Continuous Infusions: . sodium chloride 45 mL/hr at 10/25/14 1214    Active Problems:   UTI (lower urinary tract infection)    Time spent: 35 minutes.     Niel Hummer A  [facility] Pager [phone]. If 7PM-7AM, please contact night-coverage at [email or web address], password [facility] 10/25/2014, 2:30 PM  LOS: 1 day

## 2014-10-26 LAB — URINE CULTURE: Culture: >=100000

## 2014-10-26 LAB — BASIC METABOLIC PANEL
ANION GAP: 6 (ref 5–15)
BUN: 23 mg/dL — AB (ref 6–20)
CO2: 26 mmol/L (ref 22–32)
Calcium: 9.1 mg/dL (ref 8.9–10.3)
Chloride: 109 mmol/L (ref 101–111)
Creatinine, Ser: 1.21 mg/dL (ref 0.61–1.24)
GFR calc Af Amer: >60 mL/min (ref >60)
GFR calc non Af Amer: 54 mL/min — ABNORMAL LOW (ref >60)
GLUCOSE: 100 mg/dL — AB (ref 65–99)
POTASSIUM: 4.3 mmol/L (ref 3.5–5.1)
Sodium: 141 mmol/L (ref 135–145)

## 2014-10-26 LAB — CBC
HEMATOCRIT: 43.7 % (ref 39.0–52.0)
Hemoglobin: 13.4 g/dL (ref 13.0–17.0)
MCH: 28.8 pg (ref 26.0–34.0)
MCHC: 30.7 g/dL (ref 30.0–36.0)
MCV: 93.8 fL (ref 78.0–100.0)
Platelets: 178 10*3/uL (ref 150–400)
RBC: 4.66 MIL/uL (ref 4.22–5.81)
RDW: 15 % (ref 11.5–15.5)
WBC: 10.3 10*3/uL (ref 4.0–10.5)

## 2014-10-26 MED ORDER — LINEZOLID 600 MG PO TABS: 600.0000 mg | ORAL_TABLET | Freq: Two times a day (BID) | ORAL | Status: DC

## 2014-10-26 NOTE — Evaluation (Signed)
Physical Therapy Evaluation Patient Details Name: [name] MRN: [id number] DOB: 1931/02/05 Today's Date: 10/26/2014   History of Present Illness  ALEM Hall is a 81 y.o. male with PMH significant for recurrent UTI, bladder cancer S/P surgery and Chemo, who presents for treatment of UTI. Patient was notice to be confuse, unsteady gait by granddaughter. He usually has an UTI when he has those symptoms. admitted 10/24/14  Clinical Impression  Patient with very  Unsteady gait, festinating, reports multiple falls. Patient will beneft from PT while in acute car to address problems listed in note below to improve safety and mobility.    Follow Up Recommendations SNF;Supervision/Assistance - 24 hour    Equipment Recommendations  None recommended by PT    Recommendations for Other Services       Precautions / Restrictions Precautions Precautions: Fall Precaution Comments: incontinence      Mobility  Bed Mobility Overal bed mobility: Needs Assistance Bed Mobility: Supine to Sit     Supine to sit: Min assist     General bed mobility comments: extra time, delayed  Transfers Overall transfer level: Needs assistance Equipment used: Rolling walker (2 wheeled) Transfers: Sit to/from Stand Sit to Stand: Min assist         General transfer comment: cues for safety, staedy assist  for balance, noted  decreased balance  Ambulation/Gait Ambulation/Gait assistance: Mod assist;+2 safety/equipment Ambulation Distance (Feet): 95 Feet Assistive device: Rolling walker (2 wheeled) Gait Pattern/deviations: Step-to pattern;Festinating;Shuffle;Decreased step length - left;Decreased stance time - right;Trunk flexed     General Gait Details: cues for position inside of RW, tends to push  too far forward. Festinating worsens when turning. Several stops to regroup and continue-initial steps are longer, then reverts to festinating gait, at times requires steady assist for  balance.  Stairs            Wheelchair Mobility    Modified Rankin (Stroke Patients Only)       Balance Overall balance assessment: Needs assistance;History of Falls Sitting-balance support: Feet supported;No upper extremity supported Sitting balance-Leahy Scale: Fair     Standing balance support: During functional activity;Bilateral upper extremity supported Standing balance-Leahy Scale: Poor                               Pertinent Vitals/Pain Pain Assessment: No/denies pain    Home Living Family/patient expects to be discharged to:: Private residence   Available Help at Discharge: Family Type of Home: House Home Access: Stairs to enter Entrance Stairs-Rails: Right Entrance Stairs-Number of Steps: uncertain     Additional Comments: no family available to confirm    Prior Function Level of Independence: Needs assistance   Gait / Transfers Assistance Needed: ambulates with 4 wheeled     Comments: Pt reports having had multiple falls, states his wife says he gets in a hurry and the walker gets too far out in front of him.     Hand Dominance        Extremity/Trunk Assessment   Upper Extremity Assessment: Generalized weakness           Lower Extremity Assessment: Generalized weakness      Cervical / Trunk Assessment: Kyphotic  Communication   Communication: No difficulties  Cognition Arousal/Alertness: Awake/alert Behavior During Therapy: WFL for tasks assessed/performed Overall Cognitive Status: No family/caregiver present to determine baseline cognitive functioning       Memory: Decreased recall of precautions  General Comments      Exercises        Assessment/Plan    PT Assessment Patient needs continued PT services  PT Diagnosis Difficulty walking;Generalized weakness;Abnormality of gait   PT Problem List Decreased strength;Decreased activity tolerance;Decreased mobility;Decreased  cognition;Decreased coordination;Decreased balance;Decreased knowledge of use of DME;Decreased safety awareness;Decreased knowledge of precautions  PT Treatment Interventions DME instruction;Gait training;Functional mobility training;Therapeutic activities;Therapeutic exercise;Patient/family education   PT Goals (Current goals can be found in the Care Plan section) Acute Rehab PT Goals Patient Stated Goal: agreed to getting up PT Goal Formulation: With patient Time For Goal Achievement: 11/09/14 Potential to Achieve Goals: Good    Frequency Min 3X/week   Barriers to discharge Decreased caregiver support      Co-evaluation               End of Session Equipment Utilized During Treatment: Gait belt Activity Tolerance: Patient tolerated treatment well Patient left: in chair;with call bell/phone within reach;with chair alarm set Nurse Communication: Mobility status         Time: 7408-1448 PT Time Calculation (min) (ACUTE ONLY): 19 min   Charges:   PT Evaluation $Initial PT Evaluation Tier I: 1 Procedure     PT G CodesClaretha Cooper 10/26/2014, 10:25 AM Tresa Endo PT [phone]

## 2014-10-26 NOTE — Care Management Note (Signed)
Case Management Note  Patient Details  Name: [name] MRN: [id number] Date of Birth: 08-29-1930  Subjective/Objective:         UTI           Action/Plan: Home Health - pt will need Zyvox for home. Rx sent to [place], # [phone]. Will have weekday NCM follow with pharmacy for copay and if prior auth needed.   Expected Discharge Date:   (UNKNOWN)               Expected Discharge Plan:  [place]  In-House Referral:     Discharge planning Services  CM Consult, Medication Assistance  Post Acute Care Choice:    Choice offered to:     DME Arranged:    DME Agency:     HH Arranged:    HH Agency:     Status of Service:  In process, will continue to follow  Medicare Important Message Given:    Date Medicare IM Given:    Medicare IM give by:    Date Additional Medicare IM Given:    Additional Medicare Important Message give by:     If discussed at [place] of Stay Meetings, dates discussed:    Additional Comments:  Erenest Rasher, RN 10/26/2014, 5:02 PM

## 2014-10-26 NOTE — Progress Notes (Signed)
[facility] PROGRESS NOTE  [name] [id number]:[id number] DOB: January 29, 1931 DOA: 10/24/2014 PCP: [name], MD  Assessment/Plan: 1-UTI, recurrent, enterococcus sensitive to linezolid and vancomycin  Continue with IV vancomycin day 2.  Care management consulted. If insurance wont cover linezolid. Will plan for Pic line and 7 to 10 days of IV antibiotics.  Awaiting insurance approval.   2-AKI; mildly increase cr, hemoconcentration. Improved with IV fluids. Stop IVF  3-Depression; continue with Wellbutrin and Zoloft.   4-HTN; continue with metoprolol.   5-Encephalopathy: improved. Related to UTI./    Code Status: DNR Family Communication: care discussed with granddaughter.  Disposition Plan: remain inpatient   Consultants:  none  Procedures:  none  Antibiotics:  Vancomycin   HPI/Subjective: Feeling better. No abdominal pain. Alert and oriented,. Sitting in the chair. Denies dyspnea  Objective: Filed Vitals:   10/26/14 0539  BP: 172/90  Pulse: 74  Temp: 97.5 F (36.4 C)  Resp: 16    Intake/Output Summary (Last 24 hours) at 10/26/14 1244 Last data filed at 10/26/14 0544  Gross per 24 hour  Intake    480 ml  Output   1175 ml  Net   -695 ml   Filed Weights   10/24/14 1435 10/24/14 1657  Weight: 92.987 kg (205 lb) 92.987 kg (205 lb)    Exam:   General:  Alert in no distress.   Cardiovascular: S 1, S 2 RRR  Respiratory: CTA  Abdomen: BS present, soft.   Musculoskeletal: no edema  Data Reviewed: Basic Metabolic Panel:  Recent Labs Lab 10/24/14 1326 10/24/14 1346 10/25/14 0444 10/26/14 0448  NA 142 141 138 141  K 3.8 3.8 3.8 4.3  CL 106 104 106 109  CO2 27  --  25 26  GLUCOSE 104* 101* 93 100*  BUN 28* 31* 27* 23*  CREATININE 1.35* 1.30* 1.28* 1.21  CALCIUM 9.6  --  8.9 9.1   Liver Function Tests: No results for input(s): AST, ALT, ALKPHOS, BILITOT, PROT, ALBUMIN in the last 168 hours. No results for input(s): LIPASE,  AMYLASE in the last 168 hours. No results for input(s): AMMONIA in the last 168 hours. CBC:  Recent Labs Lab 10/24/14 1326 10/24/14 1346 10/25/14 0444 10/26/14 0448  WBC 10.6*  --  10.8* 10.3  NEUTROABS 8.2*  --   --   --   HGB 14.4 16.3 13.8 13.4  HCT 46.4 48.0 44.7 43.7  MCV 93.7  --  94.1 93.8  PLT 212  --  176 178   Cardiac Enzymes: No results for input(s): CKTOTAL, CKMB, CKMBINDEX, TROPONINI in the last 168 hours. BNP (last 3 results)  Recent Labs  08/19/14 2032  BNP 107.6*    ProBNP (last 3 results) No results for input(s): PROBNP in the last 8760 hours.  CBG: No results for input(s): GLUCAP in the last 168 hours.  Recent Results (from the past 240 hour(s))  Urine culture     Status: None   Collection Time: 10/24/14  2:32 PM  Result Value Ref Range Status   Specimen Description URINE, CATHETERIZED  Final   Special Requests NONE  Final   Culture   Final    >=100,000 COLONIES/mL ENTEROCOCCUS SPECIES Performed at [facility]    Report Status 10/26/2014 FINAL  Final   Organism ID, Bacteria ENTEROCOCCUS SPECIES  Final      Susceptibility   Enterococcus species - MIC*    AMPICILLIN >=32 RESISTANT Resistant     LEVOFLOXACIN >=8 RESISTANT Resistant  NITROFURANTOIN 256 RESISTANT Resistant     VANCOMYCIN <=0.5 SENSITIVE Sensitive     LINEZOLID 2 SENSITIVE Sensitive     * >=100,000 COLONIES/mL ENTEROCOCCUS SPECIES     Studies: No results found.  Scheduled Meds: . aspirin  81 mg Oral q morning - 10a  . atorvastatin  10 mg Oral QHS  . buPROPion  75 mg Oral q morning - 10a  . calcium carbonate  1 tablet Oral Q breakfast  . cholecalciferol  1,000 Units Oral q morning - 10a  . vitamin B-12  500 mcg Oral Daily  . enoxaparin (LOVENOX) injection  40 mg Subcutaneous Q24H  . Influenza vac split quadrivalent PF  0.5 mL Intramuscular Tomorrow-1000  . metoprolol succinate  25 mg Oral Daily  . saccharomyces boulardii  250 mg Oral BID  . sertraline  100 mg  Oral Daily  . vancomycin  750 mg Intravenous Q12H  . ascorbic acid  1,000 mg Oral q morning - 10a   Continuous Infusions:    Active Problems:   UTI (lower urinary tract infection)    Time spent: 35 minutes.     Niel Hummer A  [facility] Pager [phone]. If 7PM-7AM, please contact night-coverage at [email or web address], password [facility] 10/26/2014, 12:44 PM  LOS: 2 days

## 2014-10-26 NOTE — Progress Notes (Signed)
Utilization Review Completed.[name] T9/05/2014  

## 2014-10-27 ENCOUNTER — Inpatient Hospital Stay (HOSPITAL_COMMUNITY): Payer: Medicare Other

## 2014-10-27 DIAGNOSIS — N390 Urinary tract infection, site not specified: Secondary | ICD-10-CM | POA: Diagnosis not present

## 2014-10-27 LAB — VANCOMYCIN, TROUGH: VANCOMYCIN TR: 15 ug/mL (ref 10.0–20.0)

## 2014-10-27 MED ORDER — SODIUM CHLORIDE 0.9 % IJ SOLN: 10.0000 mL | Freq: Two times a day (BID) | INTRAMUSCULAR | Status: DC

## 2014-10-27 MED ORDER — HYDRALAZINE HCL 20 MG/ML IJ SOLN
10.0000 mg | Freq: Four times a day (QID) | INTRAMUSCULAR | Status: DC | PRN
  Administered 2014-10-27: 10 mg via INTRAVENOUS
  Filled 2014-10-27: qty 1

## 2014-10-27 MED ORDER — SODIUM CHLORIDE 0.9 % IJ SOLN: 10.0000 mL | INTRAMUSCULAR | Status: DC | PRN

## 2014-10-27 MED ORDER — VANCOMYCIN HCL IN DEXTROSE 750-5 MG/150ML-% IV SOLN: 750.0000 mg | Freq: Two times a day (BID) | INTRAVENOUS | Status: DC

## 2014-10-27 NOTE — Progress Notes (Signed)
Patient received PICC line. Vancomcin given and left at 0750. Lorrene Reid

## 2014-10-27 NOTE — Progress Notes (Signed)
Peripherally Inserted Central Catheter/Midline Placement  The IV Nurse has discussed with the patient and/or persons authorized to consent for the patient, the purpose of this procedure and the potential benefits and risks involved with this procedure.  The benefits include less needle sticks, lab draws from the catheter and patient may be discharged home with the catheter.  Risks include, but not limited to, infection, bleeding, blood clot (thrombus formation), and puncture of an artery; nerve damage and irregular heat beat.  Alternatives to this procedure were also discussed.  PICC/Midline Placement Documentation  PICC / Midline Single Lumen 10/27/14 PICC Right Brachial 42 cm 0 cm (Active)  Indication for Insertion or Continuance of Line Home intravenous therapies (PICC only) 10/27/2014  5:00 PM  Exposed Catheter (cm) 0 cm 10/27/2014  5:00 PM  Dressing Change Due 11/03/14 10/27/2014  5:00 PM       Jule Economy Horton 10/27/2014, 5:47 PM

## 2014-10-27 NOTE — Progress Notes (Signed)
Pharmacy - Vancomycin dosing  Assessment: See today's earlier note from Dia Sitter, PharmD for full details.  Briefly, 31 yoM on day #4 vancomycin for enterococcal UTI. Currently planning to be discharged on vancomycin after PICC placement.  Note missed dose x 1 on 9/3.  Vancomycin trough = 15 on 750 mg q12 hr (goal range 10-15)  Plan:  Recommend continue vancomycin 750 mg IV q12 hr for home administration by [facility]  Would recheck vancomycin trough in 3-5 days to r/o accumulation    Reuel Boom, PharmD, BCPS Pager: [phone] 10/27/2014, 2:41 PM

## 2014-10-27 NOTE — Progress Notes (Signed)
ANTIBIOTIC CONSULT NOTE - INITIAL  Pharmacy Consult for Vancomycin Indication: enterococcus UTI  Allergies  Allergen Reactions  . Sulfa Antibiotics Nausea And Vomiting  . Sulfonamide Derivatives Nausea And Vomiting    Patient Measurements: Height: '6\' 1"'$  (185.4 cm) Weight: 205 lb (92.987 kg) IBW/kg (Calculated) : 79.9 Last weight 92.3 kg (08/2014)  Vital Signs: Temp: 97.6 F (36.4 C) (09/05 0502) Temp Source: Oral (09/05 0502) BP: 151/101 mmHg (09/05 0717) Pulse Rate: 70 (09/05 0502) Intake/Output from previous day: 09/04 0701 - 09/05 0700 In: 720 [P.O.:720] Out: 2050 [Urine:2050] Intake/Output from this shift:    Labs:  Recent Labs  10/24/14 1326 10/24/14 1346 10/25/14 0444 10/26/14 0448  WBC 10.6*  --  10.8* 10.3  HGB 14.4 16.3 13.8 13.4  PLT 212  --  176 178  CREATININE 1.35* 1.30* 1.28* 1.21   Estimated Creatinine Clearance: 53.2 mL/min (by C-G formula based on Cr of 1.21). No results for input(s): VANCOTROUGH, VANCOPEAK, VANCORANDOM, GENTTROUGH, GENTPEAK, GENTRANDOM, TOBRATROUGH, TOBRAPEAK, TOBRARND, AMIKACINPEAK, AMIKACINTROU, AMIKACIN in the last 72 hours.   Microbiology: Recent Results (from the past 720 hour(s))  Urine culture     Status: None   Collection Time: 10/24/14  2:32 PM  Result Value Ref Range Status   Specimen Description URINE, CATHETERIZED  Final   Special Requests NONE  Final   Culture   Final    >=100,000 COLONIES/mL ENTEROCOCCUS SPECIES Performed at [facility]    Report Status 10/26/2014 FINAL  Final   Organism ID, Bacteria ENTEROCOCCUS SPECIES  Final      Susceptibility   Enterococcus species - MIC*    AMPICILLIN >=32 RESISTANT Resistant     LEVOFLOXACIN >=8 RESISTANT Resistant     NITROFURANTOIN 256 RESISTANT Resistant     VANCOMYCIN <=0.5 SENSITIVE Sensitive     LINEZOLID 2 SENSITIVE Sensitive     * >=100,000 COLONIES/mL ENTEROCOCCUS SPECIES    Medical History: Past Medical History  Diagnosis Date  . Atrial  fibrillation   . Hyperlipidemia   . Hypertension   . UTI (urinary tract infection)   . Dementia   . Bladder cancer     hx of surgery and chemo  . Lung cancer     Status post surgery resection  . Diverticulitis   . Depression 08/19/2014    Medications:  Anti-infectives    Start     Dose/Rate Route Frequency Ordered Stop   10/26/14 0000  linezolid (ZYVOX) 600 MG tablet  Status:  Discontinued     600 mg Oral 2 times daily 10/26/14 1136 10/26/14    10/26/14 0000  linezolid (ZYVOX) 600 MG tablet     600 mg Oral 2 times daily 10/26/14 1137     10/25/14 0600  vancomycin (VANCOCIN) IVPB 750 mg/150 ml premix     750 mg 150 mL/hr over 60 Minutes Intravenous Every 12 hours 10/24/14 1446     10/24/14 1300  vancomycin (VANCOCIN) 2,000 mg in sodium chloride 0.9 % 500 mL IVPB     2,000 mg 250 mL/hr over 120 Minutes Intravenous  Once 10/24/14 1257 10/24/14 1545     Assessment: 58 yoM presented to ED on 9/2 per Dr. Diona Fanti recommendation for UTI not responding to outpatient treatment with Macrobid.  Urine culture from Urology office with Enterococcus sensitive to Linezolid and Vanc, but insurance company declined to cover outpatient Linezolid.  Urine culture (08/19/14) grew > 100,000 Enterococcus Faecium (sens: Ampicillin, Linezolid, and Vanc) and Urine culture (05/21/14) grew > 100,000 Pseudomonas Aeruginosa (pan  sens to all tested).  He lives with family at home, has history of dementia with increased falls recently.  Vanc 2g IV ordered per MD and given in ED, then pharmacy is consulted for Vanc dosing.  Today, 10/27/2014:  Vancomycin day # 4  Afebrile  WBC wnl  SCr trending down 1.21 (crcl~53)  Of note, vancomycin 6PM dose not charted/given on 9/3  9/2 >> Vanc >>  9/2 Urine: => 100,000 colonies/mL Enterococcus (S to Linezolid, Vancomycin)  Past cxt data: 9/1 Urine culture ([facility], not viewable in [facility]):  >100,000 Enterococcus (sens: Linezolid, Vanc) 08/19/14 Urine culture >  100,000 Enterococcus Faecium (sens: Ampicillin, Linezolid, and Vanc) 05/21/14 Urine culture > 100,000 Pseudomonas Aeruginosa (pan sens to all tested)   Goal of Therapy:  Vancomycin trough level 10-15 mcg/ml Appropriate abx dosing, eradication of infection.   Plan:   Vancomycin '750mg'$  IV q12h  Will check vancomycin trough tonight to assess current regimen  Follow up renal fxn and clinical course.   Dia Sitter, PharmD, BCPS 10/27/2014 8:13 AM

## 2014-10-27 NOTE — Discharge Summary (Signed)
Physician Discharge Summary  [name] BBC:[id number] DOB: [birth year] DOA: 10/24/2014  PCP: [name], MD  Admit date: 10/24/2014 Discharge date: 10/27/2014  Time spent: 35 minutes  Recommendations for Outpatient Follow-up:  Needs B-met to follow renal function Follow up with his primary urology for further care of recurrent infections   Discharge Diagnoses:    Enterococcus UTI (lower urinary tract infection)   Encephalopathy   HTN   Depression   Discharge Condition: stable.   Diet recommendation: Heart healthy  Filed Weights   10/24/14 1435 10/24/14 1657  Weight: 92.987 kg (205 lb) 92.987 kg (205 lb)    History of present illness:  [name] is a 81 y.o. male with PMH significant for recurrent UTI, bladder cancer S/P surgery and Chemo, who presents for treatment of UTI. Patient was notice to be confuse, unsteady gait by granddaughter. He usually has an UTI when he has those symptoms. He was evaluated by his urologist. He was prescribe oral antibiotics. Subsequently urine culture grew enterococcus only sensitive to vancomycin and linezolid. Per granddaughter insurance wont pay for the medications, it is 2000, and they are not able to afford it.   Hospital Course:  1-UTI, recurrent, enterococcus sensitive to linezolid and vancomycin  Continue with IV vancomycin day 3/7.  Discharge on IV vancomycin. There might be risk of serotonin syndrome with linezolid.   2-AKI; mildly increase cr, hemoconcentration. Improved with IV fluids. Stop IVF. Resolved.   3-Depression; continue with Wellbutrin and Zoloft.   4-HTN; continue with metoprolol.   5-Encephalopathy: improved. Related to UTI./   Procedures:  PICC line placement.   Consultations:  none  Discharge Exam: Filed Vitals:   10/27/14 0717  BP: 151/101  Pulse:   Temp:   Resp:     General: NAD Cardiovascular: S 1, S 2 RRR Respiratory: CTA  Discharge Instructions   Discharge Instructions     Diet - low sodium heart healthy    Complete by:  As directed      Increase activity slowly    Complete by:  As directed           Current Discharge Medication List    START taking these medications   Details  Vancomycin (VANCOCIN) 750 MG/150ML SOLN Inject 150 mLs (750 mg total) into the vein every 12 (twelve) hours. Qty: 1500 mL, Refills: 0      CONTINUE these medications which have NOT CHANGED   Details  ALPHA LIPOIC ACID PO Take 200-300 mg by mouth every morning.     ascorbic acid (VITAMIN C) 1000 MG tablet Take 1,000 mg by mouth every morning.     aspirin 81 MG chewable tablet Chew 81 mg by mouth every morning.     atorvastatin (LIPITOR) 10 MG tablet Take 10 mg by mouth at bedtime.     buPROPion (WELLBUTRIN) 75 MG tablet Take 75 mg by mouth every morning. daily    calcium carbonate (OS-CAL - DOSED IN MG OF ELEMENTAL CALCIUM) 1250 MG tablet Take 1 tablet by mouth daily with breakfast.     cholecalciferol (VITAMIN D) 1000 UNITS tablet Take 1,000 Units by mouth every morning.     Cyanocobalamin (VITAMIN B 12 PO) Take 500 mg by mouth every morning.     esomeprazole (NEXIUM) 40 MG capsule Take 40 mg by mouth daily as needed (reflux.).    hydrocortisone valerate cream (WESTCORT) 0.2 % Apply 1 application topically 2 (two) times daily as needed (itching).     MELATONIN PO Take  1 tablet by mouth at bedtime as needed (sleep).    metoprolol succinate (TOPROL-XL) 25 MG 24 hr tablet Take 25 mg by mouth daily.    Probiotic Product (PROBIOTIC DAILY PO) Take 460 mg by mouth every morning.     sertraline (ZOLOFT) 100 MG tablet Take 100 mg by mouth daily.    ibuprofen (ADVIL,MOTRIN) 400 MG tablet Take 1 tablet (400 mg total) by mouth every 6 (six) hours as needed. Take 3 times daily for 3 days, then use every 6 hours as needed. Qty: 30 tablet, Refills: 0      STOP taking these medications     methenamine (HIPREX) 1 G tablet      nitrofurantoin (MACRODANTIN) 100 MG capsule       amoxicillin-clavulanate (AUGMENTIN) 875-125 MG per tablet      cephALEXin (KEFLEX) 500 MG capsule        Allergies  Allergen Reactions  . Sulfa Antibiotics Nausea And Vomiting  . Sulfonamide Derivatives Nausea And Vomiting   Follow-up Information    Follow up with [name], MD.   Specialty:  Internal Medicine   Contact information:   [address] [place] [address] [phone]       Follow up with [facility].   Why:  HHRN-long term iv abx,labs,picc flush,HHPT   Contact information:   [address] [place] [address] [phone]        The results of significant diagnostics from this hospitalization (including imaging, microbiology, ancillary and laboratory) are listed below for reference.    Significant Diagnostic Studies: No results found.  Microbiology: Recent Results (from the past 240 hour(s))  Urine culture     Status: None   Collection Time: 10/24/14  2:32 PM  Result Value Ref Range Status   Specimen Description URINE, CATHETERIZED  Final   Special Requests NONE  Final   Culture   Final    >=100,000 COLONIES/mL ENTEROCOCCUS SPECIES Performed at [facility]    Report Status 10/26/2014 FINAL  Final   Organism ID, Bacteria ENTEROCOCCUS SPECIES  Final      Susceptibility   Enterococcus species - MIC*    AMPICILLIN >=32 RESISTANT Resistant     LEVOFLOXACIN >=8 RESISTANT Resistant     NITROFURANTOIN 256 RESISTANT Resistant     VANCOMYCIN <=0.5 SENSITIVE Sensitive     LINEZOLID 2 SENSITIVE Sensitive     * >=100,000 COLONIES/mL ENTEROCOCCUS SPECIES     Labs: Basic Metabolic Panel:  Recent Labs Lab 10/24/14 1326 10/24/14 1346 10/25/14 0444 10/26/14 0448  NA 142 141 138 141  K 3.8 3.8 3.8 4.3  CL 106 104 106 109  CO2 27  --  25 26  GLUCOSE 104* 101* 93 100*  BUN 28* 31* 27* 23*  CREATININE 1.35* 1.30* 1.28* 1.21  CALCIUM 9.6  --  8.9 9.1   Liver Function Tests: No results for input(s): AST,  ALT, ALKPHOS, BILITOT, PROT, ALBUMIN in the last 168 hours. No results for input(s): LIPASE, AMYLASE in the last 168 hours. No results for input(s): AMMONIA in the last 168 hours. CBC:  Recent Labs Lab 10/24/14 1326 10/24/14 1346 10/25/14 0444 10/26/14 0448  WBC 10.6*  --  10.8* 10.3  NEUTROABS 8.2*  --   --   --   HGB 14.4 16.3 13.8 13.4  HCT 46.4 48.0 44.7 43.7  MCV 93.7  --  94.1 93.8  PLT 212  --  176 178   Cardiac Enzymes: No results for  input(s): CKTOTAL, CKMB, CKMBINDEX, TROPONINI in the last 168 hours. BNP: BNP (last 3 results)  Recent Labs  08/19/14 2032  BNP 107.6*    ProBNP (last 3 results) No results for input(s): PROBNP in the last 8760 hours.  CBG: No results for input(s): GLUCAP in the last 168 hours.     SignedNiel Hummer A  [facility] 10/27/2014, 11:05 AM

## 2014-10-27 NOTE — Care Management Note (Signed)
Case Management Note  Patient Details  Name: [name] MRN: [id number] Date of Birth: 16-Feb-1931  Subjective/Objective:Patient declines SNF.Spoke to patient, & called grand dtr-Lindsay c#[phone]-confirmed that [facility] chosen for long term iv abx, & HHPT.for picc line today, & per MD d/c today home. [name] says she want to be taught prior to patient going home about iv abx, & co pay of abx.Informed [place] about grand dtr's concerns. [facility] rep Cyril Mourning will address these concerns to [place].[name] has private duty caregivers from [facility] already in place.                   Action/Plan:d/c plan home w/HHC-long term iv abx, HHPT   Expected Discharge Date:   (UNKNOWN)               Expected Discharge Plan:  [place]  In-House Referral:     Discharge planning Services  CM Consult, Medication Assistance  Post Acute Care Choice:    Choice offered to:     DME Arranged:    DME Agency:     HH Arranged:    [place] Agency:  [place]  Status of Service:  In process, will continue to follow  Medicare Important Message Given:    Date Medicare IM Given:    Medicare IM give by:    Date Additional Medicare IM Given:    Additional Medicare Important Message give by:     If discussed at [place] of Stay Meetings, dates discussed:    Additional Comments:  [name], RN 10/27/2014, 10:53 AM

## 2014-10-27 NOTE — Clinical Social Work Note (Signed)
Clinical Social Work Assessment  Patient Details  Name: [name] MRN: [id number] Date of Birth: October 26, 1930  Date of referral:  10/27/14               Reason for consult:  Discharge Planning                Permission sought to share information with:  Family Supports Permission granted to share information::  Yes, Verbal Permission Granted  Name::     Micki Riley  Agency::     Relationship::  granddaughter  Contact Information:  [phone]  Housing/Transportation Living arrangements for the past 2 months:  Single Family Home Source of Information:  Patient, Other (Comment Required) (granddaughter) Patient Interpreter Needed:  None Criminal Activity/Legal Involvement Pertinent to Current Situation/Hospitalization:  No - Comment as needed Significant Relationships:  Other Family Members Lives with:  Other (Comment) (granddaughter) Do you feel safe going back to the place where you live?  Yes Need for family participation in patient care:  Yes (Comment)  Care giving concerns:  Pt admitted from home with pt granddaughter. PT recommending SNF; 24 hour supervision. Pt needs IV antibiotics upon discharge.   Social Worker assessment / plan:  CSW received referral for New SNF.  CSW visited pt room. Pt eating breakfast at this time. Pt reports that he lives at home with pt granddaughter. Pt agreeable to CSW contacting pt granddaughter to discuss pt needs.  CSW contacted pt granddaughter, [name] via telephone. CSW introduced self and explained role. Pt granddaughter confirmed that pt lives with her. Pt granddaughter explained that pt lives in pt granddaughter basement and [facility] comes to pt home from 73 to 1. CSW discussed PT recommendation for 24 hour care. Pt granddaughter shared that pt granddaughter will work something out to ensure that pt has supervision 24 hours a day. Pt granddaughter states that pt does not want to go to a SNF and she wants to respect those wishes  in order for pt to remain at home. Pt granddaughter stated that she feels comfortable with administering pt IV antibiotics as long as she is provided teaching. Pt granddaughter stated that pt has been using [facility] and wants to continue with [facility] [facility]. CSW provided education to pt granddaughter that Medicare allows 30 day following discharge if pt care needs are not manageable at home for pt to be placed in SNF. Pt granddaughter thankful for this information.  CSW notified RNCM that pt granddaughter would like for pt to return home with home health services.  No further social work needs identified at this time.  CSW signing off.   Employment status:  Retired Advertising copywriter PT Recommendations:  [place], [place] / Referral to community resources:  Other (Comment Required) (referral to [facility])  Patient/Family's Response to care:  Pt alert and oriented x 3. Pt pleasant. Pt granddaughter supportive and actively involved in pt care. Pt granddaughter wants to respect pt wishes to remain at home and will arrange additional care through home instead if needed along with the support of home health therapies.  Patient/Family's Understanding of and Emotional Response to Diagnosis, Current Treatment, and Prognosis:  Pt granddaughter displayed understanding of diagnosis and treatment plan. Pt granddaughter comfortable with administering IV antibiotics at home.   Emotional Assessment Appearance:  Appears stated age Attitude/Demeanor/Rapport:  Other (pt appropriate) Affect (typically observed):  Appropriate Orientation:  Oriented to Self, Oriented to Place, Oriented to Situation Alcohol /  Substance use:  Not Applicable Psych involvement (Current and /or in the community):  No (Comment)  Discharge Needs  Concerns to be addressed:  Discharge Planning Concerns Readmission within the last 30 days:  No Current discharge risk:   Physical Impairment Barriers to Discharge:  Continued Medical Work up   Ladell Pier, [place] 10/27/2014, 10:07 AM  [phone]

## 2014-10-28 ENCOUNTER — Other Ambulatory Visit: Payer: Self-pay | Admitting: *Deleted

## 2014-10-28 DIAGNOSIS — N390 Urinary tract infection, site not specified: Secondary | ICD-10-CM | POA: Diagnosis not present

## 2014-10-28 DIAGNOSIS — F329 Major depressive disorder, single episode, unspecified: Secondary | ICD-10-CM | POA: Diagnosis not present

## 2014-10-28 DIAGNOSIS — B952 Enterococcus as the cause of diseases classified elsewhere: Secondary | ICD-10-CM | POA: Diagnosis not present

## 2014-10-28 DIAGNOSIS — F0390 Unspecified dementia without behavioral disturbance: Secondary | ICD-10-CM | POA: Diagnosis not present

## 2014-10-28 DIAGNOSIS — Z87891 Personal history of nicotine dependence: Secondary | ICD-10-CM | POA: Diagnosis not present

## 2014-10-28 DIAGNOSIS — I10 Essential (primary) hypertension: Secondary | ICD-10-CM | POA: Diagnosis not present

## 2014-10-28 DIAGNOSIS — E785 Hyperlipidemia, unspecified: Secondary | ICD-10-CM | POA: Diagnosis not present

## 2014-10-28 DIAGNOSIS — I4891 Unspecified atrial fibrillation: Secondary | ICD-10-CM | POA: Diagnosis not present

## 2014-10-28 DIAGNOSIS — Z85118 Personal history of other malignant neoplasm of bronchus and lung: Secondary | ICD-10-CM | POA: Diagnosis not present

## 2014-10-28 DIAGNOSIS — Z452 Encounter for adjustment and management of vascular access device: Secondary | ICD-10-CM | POA: Diagnosis not present

## 2014-10-28 DIAGNOSIS — Z5181 Encounter for therapeutic drug level monitoring: Secondary | ICD-10-CM | POA: Diagnosis not present

## 2014-10-28 DIAGNOSIS — Z8551 Personal history of malignant neoplasm of bladder: Secondary | ICD-10-CM | POA: Diagnosis not present

## 2014-10-28 DIAGNOSIS — G9340 Encephalopathy, unspecified: Secondary | ICD-10-CM | POA: Diagnosis not present

## 2014-10-28 NOTE — Patient Outreach (Signed)
[facility]) Care Management  10/28/2014  ROMIE TAY [birth year] [id number]   Transition of care call  Telephone call to Mr.Mccullars, spoke with his caregiver [name], that he lives with. [name] reports that the patient was discharged from the [facility] on last evening about 8 PM after being admitted for UTI, and IV antiboitics [name] reports that [place] has visited this am and taught her regarding administering IV Vancomycin via PICC to the patient, [name] states, "I think I have it". I encouraged to call [facility] if she has questions. [name] reports that the patient did not have a fall last night. She reports that she has prepared his pill box medication according to the discharge instructions.She will call today to make a follow up appointment with Dr. Brigitte Pulse. [name] states that [facility] care services has resumed care at home 10 am to 1 pm, we also discussed option of increasing the number of hours that she has help with Mr.Weigold at home she is going to think about. She reports that Mr.Wagenaar will continue his twice daily in and out catheterizations as well as daily irrigations, discussed ensuring that uses clean technique. Reviewed transition of care program again, encouraged to call with concerns  Plan: follow up in 1 week. Joylene Draft, RN, [place] Care Management [phone]

## 2014-10-29 DIAGNOSIS — N390 Urinary tract infection, site not specified: Secondary | ICD-10-CM | POA: Diagnosis not present

## 2014-10-29 DIAGNOSIS — I10 Essential (primary) hypertension: Secondary | ICD-10-CM | POA: Diagnosis not present

## 2014-10-29 DIAGNOSIS — G9340 Encephalopathy, unspecified: Secondary | ICD-10-CM | POA: Diagnosis not present

## 2014-10-29 DIAGNOSIS — Z792 Long term (current) use of antibiotics: Secondary | ICD-10-CM | POA: Diagnosis not present

## 2014-10-29 DIAGNOSIS — F329 Major depressive disorder, single episode, unspecified: Secondary | ICD-10-CM | POA: Diagnosis not present

## 2014-10-29 DIAGNOSIS — Z452 Encounter for adjustment and management of vascular access device: Secondary | ICD-10-CM | POA: Diagnosis not present

## 2014-10-29 DIAGNOSIS — B952 Enterococcus as the cause of diseases classified elsewhere: Secondary | ICD-10-CM | POA: Diagnosis not present

## 2014-11-03 DIAGNOSIS — N390 Urinary tract infection, site not specified: Secondary | ICD-10-CM | POA: Diagnosis not present

## 2014-11-03 DIAGNOSIS — N183 Chronic kidney disease, stage 3 (moderate): Secondary | ICD-10-CM | POA: Diagnosis not present

## 2014-11-04 ENCOUNTER — Other Ambulatory Visit: Payer: Self-pay | Admitting: *Deleted

## 2014-11-04 DIAGNOSIS — I10 Essential (primary) hypertension: Secondary | ICD-10-CM | POA: Diagnosis not present

## 2014-11-04 DIAGNOSIS — B952 Enterococcus as the cause of diseases classified elsewhere: Secondary | ICD-10-CM | POA: Diagnosis not present

## 2014-11-04 DIAGNOSIS — G9340 Encephalopathy, unspecified: Secondary | ICD-10-CM | POA: Diagnosis not present

## 2014-11-04 DIAGNOSIS — N390 Urinary tract infection, site not specified: Secondary | ICD-10-CM | POA: Diagnosis not present

## 2014-11-04 DIAGNOSIS — Z452 Encounter for adjustment and management of vascular access device: Secondary | ICD-10-CM | POA: Diagnosis not present

## 2014-11-04 DIAGNOSIS — F329 Major depressive disorder, single episode, unspecified: Secondary | ICD-10-CM | POA: Diagnosis not present

## 2014-11-04 NOTE — Patient Outreach (Signed)
[facility]) Care Management  11/04/2014  [name] 11-05-1930 [id number]   Transition of care week 2 Subjective : Spoke with [name] grand daughter [name], the patients caregiver that he lives with, she reports that the patient is doing pretty good, no falls since last week. [name] reports that [name] had his office visit with Dr.Shaw recently, and that patient will continue home IV antibiotic via PICC line for another week. [name] has an appointment with urologist on this Friday, September 16. [name] reports that the patient continues to perform his daily bladder irrigation and twice daily catheterizations. [facility] personal care assistants are still involved with patient care 5 days per week. [name] states  [facility] visits one day week, otherwise [name] is completing daily antibiotics. [name] reports that [name] takes all of his other medications without problems.   Assessment and Plan:  Falls: Decreased number of falls Frequent UTI : Patient with continued treatment of infection,Grand daughter [name] is administering daily IV antibiotics as educated by [facility], encouraged her to call agency if she has problems or concerns with medication administration Monday through Sunday.  [name] reports no other needs or concerns at this time Will schedule home visit for September 27, and continue transition of care call on 9/20.  Joylene Draft, RN, [place] Care Management [phone]

## 2014-11-06 DIAGNOSIS — Z792 Long term (current) use of antibiotics: Secondary | ICD-10-CM | POA: Diagnosis not present

## 2014-11-06 DIAGNOSIS — F329 Major depressive disorder, single episode, unspecified: Secondary | ICD-10-CM | POA: Diagnosis not present

## 2014-11-06 DIAGNOSIS — N390 Urinary tract infection, site not specified: Secondary | ICD-10-CM | POA: Diagnosis not present

## 2014-11-06 DIAGNOSIS — G9340 Encephalopathy, unspecified: Secondary | ICD-10-CM | POA: Diagnosis not present

## 2014-11-06 DIAGNOSIS — B952 Enterococcus as the cause of diseases classified elsewhere: Secondary | ICD-10-CM | POA: Diagnosis not present

## 2014-11-06 DIAGNOSIS — I10 Essential (primary) hypertension: Secondary | ICD-10-CM | POA: Diagnosis not present

## 2014-11-06 DIAGNOSIS — Z452 Encounter for adjustment and management of vascular access device: Secondary | ICD-10-CM | POA: Diagnosis not present

## 2014-11-07 ENCOUNTER — Encounter (HOSPITAL_COMMUNITY): Payer: Self-pay

## 2014-11-07 ENCOUNTER — Emergency Department (HOSPITAL_COMMUNITY): Payer: Medicare Other

## 2014-11-07 ENCOUNTER — Emergency Department (HOSPITAL_COMMUNITY)
Admission: EM | Admit: 2014-11-07 | Discharge: 2014-11-07 | Disposition: A | Discharge status: Home / Self Care | Payer: Medicare Other | Attending: Emergency Medicine | Admitting: Emergency Medicine

## 2014-11-07 DIAGNOSIS — Z7982 Long term (current) use of aspirin: Secondary | ICD-10-CM | POA: Insufficient documentation

## 2014-11-07 DIAGNOSIS — I10 Essential (primary) hypertension: Secondary | ICD-10-CM | POA: Diagnosis not present

## 2014-11-07 DIAGNOSIS — I4891 Unspecified atrial fibrillation: Secondary | ICD-10-CM | POA: Diagnosis not present

## 2014-11-07 DIAGNOSIS — Z87891 Personal history of nicotine dependence: Secondary | ICD-10-CM | POA: Insufficient documentation

## 2014-11-07 DIAGNOSIS — R29810 Facial weakness: Secondary | ICD-10-CM | POA: Insufficient documentation

## 2014-11-07 DIAGNOSIS — N390 Urinary tract infection, site not specified: Secondary | ICD-10-CM | POA: Diagnosis not present

## 2014-11-07 DIAGNOSIS — Y9389 Activity, other specified: Secondary | ICD-10-CM | POA: Insufficient documentation

## 2014-11-07 DIAGNOSIS — Z85118 Personal history of other malignant neoplasm of bronchus and lung: Secondary | ICD-10-CM | POA: Insufficient documentation

## 2014-11-07 DIAGNOSIS — Z79899 Other long term (current) drug therapy: Secondary | ICD-10-CM | POA: Diagnosis not present

## 2014-11-07 DIAGNOSIS — W01198A Fall on same level from slipping, tripping and stumbling with subsequent striking against other object, initial encounter: Secondary | ICD-10-CM | POA: Diagnosis not present

## 2014-11-07 DIAGNOSIS — Y998 Other external cause status: Secondary | ICD-10-CM | POA: Insufficient documentation

## 2014-11-07 DIAGNOSIS — Y92009 Unspecified place in unspecified non-institutional (private) residence as the place of occurrence of the external cause: Secondary | ICD-10-CM | POA: Insufficient documentation

## 2014-11-07 DIAGNOSIS — Z8719 Personal history of other diseases of the digestive system: Secondary | ICD-10-CM | POA: Insufficient documentation

## 2014-11-07 DIAGNOSIS — Z043 Encounter for examination and observation following other accident: Secondary | ICD-10-CM | POA: Diagnosis not present

## 2014-11-07 DIAGNOSIS — Z8551 Personal history of malignant neoplasm of bladder: Secondary | ICD-10-CM | POA: Insufficient documentation

## 2014-11-07 DIAGNOSIS — E785 Hyperlipidemia, unspecified: Secondary | ICD-10-CM | POA: Insufficient documentation

## 2014-11-07 DIAGNOSIS — F0390 Unspecified dementia without behavioral disturbance: Secondary | ICD-10-CM | POA: Insufficient documentation

## 2014-11-07 DIAGNOSIS — Z792 Long term (current) use of antibiotics: Secondary | ICD-10-CM | POA: Insufficient documentation

## 2014-11-07 DIAGNOSIS — B952 Enterococcus as the cause of diseases classified elsewhere: Secondary | ICD-10-CM | POA: Diagnosis not present

## 2014-11-07 DIAGNOSIS — Z452 Encounter for adjustment and management of vascular access device: Secondary | ICD-10-CM | POA: Diagnosis not present

## 2014-11-07 DIAGNOSIS — F329 Major depressive disorder, single episode, unspecified: Secondary | ICD-10-CM | POA: Insufficient documentation

## 2014-11-07 DIAGNOSIS — G9340 Encephalopathy, unspecified: Secondary | ICD-10-CM | POA: Diagnosis not present

## 2014-11-07 LAB — COMPREHENSIVE METABOLIC PANEL
ALK PHOS: 53 U/L (ref 38–126)
ALT: 14 U/L — AB (ref 17–63)
AST: 24 U/L (ref 15–41)
Albumin: 3.9 g/dL (ref 3.5–5.0)
Anion gap: 9 (ref 5–15)
BUN: 23 mg/dL — ABNORMAL HIGH (ref 6–20)
CALCIUM: 9.3 mg/dL (ref 8.9–10.3)
CO2: 27 mmol/L (ref 22–32)
CREATININE: 1.39 mg/dL — AB (ref 0.61–1.24)
Chloride: 104 mmol/L (ref 101–111)
GFR, EST AFRICAN AMERICAN: 53 mL/min — AB (ref >60)
GFR, EST NON AFRICAN AMERICAN: 46 mL/min — AB (ref >60)
Glucose, Bld: 107 mg/dL — ABNORMAL HIGH (ref 65–99)
Potassium: 3.9 mmol/L (ref 3.5–5.1)
Sodium: 140 mmol/L (ref 135–145)
Total Bilirubin: 1 mg/dL (ref 0.3–1.2)
Total Protein: 6.9 g/dL (ref 6.5–8.1)

## 2014-11-07 LAB — DIFFERENTIAL
Basophils Absolute: 0.1 10*3/uL (ref 0.0–0.1)
Basophils Relative: 1 %
Eosinophils Absolute: 0.4 10*3/uL (ref 0.0–0.7)
Eosinophils Relative: 4 %
LYMPHS PCT: 12 %
Lymphs Abs: 1.3 10*3/uL (ref 0.7–4.0)
MONO ABS: 1.2 10*3/uL — AB (ref 0.1–1.0)
MONOS PCT: 10 %
NEUTROS ABS: 8.1 10*3/uL — AB (ref 1.7–7.7)
Neutrophils Relative %: 73 %

## 2014-11-07 LAB — RAPID URINE DRUG SCREEN, HOSP PERFORMED
AMPHETAMINES: NOT DETECTED
Barbiturates: NOT DETECTED
Benzodiazepines: NOT DETECTED
Cocaine: NOT DETECTED
Opiates: NOT DETECTED
Tetrahydrocannabinol: NOT DETECTED

## 2014-11-07 LAB — I-STAT CHEM 8, ED
BUN: 25 mg/dL — AB (ref 6–20)
CALCIUM ION: 1.18 mmol/L (ref 1.13–1.30)
CHLORIDE: 104 mmol/L (ref 101–111)
Creatinine, Ser: 1.4 mg/dL — ABNORMAL HIGH (ref 0.61–1.24)
GLUCOSE: 102 mg/dL — AB (ref 65–99)
HCT: 45 % (ref 39.0–52.0)
Hemoglobin: 15.3 g/dL (ref 13.0–17.0)
Potassium: 4 mmol/L (ref 3.5–5.1)
Sodium: 141 mmol/L (ref 135–145)
TCO2: 25 mmol/L (ref 0–100)

## 2014-11-07 LAB — CBC
HEMATOCRIT: 45 % (ref 39.0–52.0)
Hemoglobin: 13.9 g/dL (ref 13.0–17.0)
MCH: 28.8 pg (ref 26.0–34.0)
MCHC: 30.9 g/dL (ref 30.0–36.0)
MCV: 93.4 fL (ref 78.0–100.0)
Platelets: 234 10*3/uL (ref 150–400)
RBC: 4.82 MIL/uL (ref 4.22–5.81)
RDW: 14.9 % (ref 11.5–15.5)
WBC: 11 10*3/uL — AB (ref 4.0–10.5)

## 2014-11-07 LAB — URINALYSIS, ROUTINE W REFLEX MICROSCOPIC
Bilirubin Urine: NEGATIVE
Glucose, UA: NEGATIVE mg/dL
Ketones, ur: NEGATIVE mg/dL
Nitrite: NEGATIVE
PROTEIN: 30 mg/dL — AB
Specific Gravity, Urine: 1.017 (ref 1.005–1.030)
UROBILINOGEN UA: 0.2 mg/dL (ref 0.0–1.0)
pH: 6 (ref 5.0–8.0)

## 2014-11-07 LAB — PROTIME-INR
INR: 1.11 (ref 0.00–1.49)
Prothrombin Time: 14.5 seconds (ref 11.6–15.2)

## 2014-11-07 LAB — I-STAT TROPONIN, ED: TROPONIN I, POC: 0.01 ng/mL (ref 0.00–0.08)

## 2014-11-07 LAB — URINE MICROSCOPIC-ADD ON

## 2014-11-07 LAB — ETHANOL

## 2014-11-07 LAB — APTT: aPTT: 28 seconds (ref 24–37)

## 2014-11-07 NOTE — ED Notes (Signed)
Patient transported to CT 

## 2014-11-07 NOTE — ED Notes (Signed)
Pt is at baseline orientation. Instructions reviewed with daughter. Will use wheelchair for transport out

## 2014-11-07 NOTE — ED Provider Notes (Signed)
CSN: [id number]     Arrival date & time 11/07/14  1242 History   First MD Initiated Contact with Patient 11/07/14 1325     Chief Complaint  Patient presents with  . Facial Droop  . Fall   HPI Patient presents to the emergency room for evaluation of left facial droop. The patient has had some recent medical issues with urinary tract infection and is taking antibiotics. He is receiving home antibiotics via PICC line. Earlier in the week patient fell and struck his head. However following the fall he had not had any symptoms until the caregivers noted a facial droop this morning. They have not noticed any extremity weakness he has not had any slurred speech. They feel like the left side of his mouth is drooping.  The patient himself denies any complaints. Past Medical History  Diagnosis Date  . Atrial fibrillation   . Hyperlipidemia   . Hypertension   . UTI (urinary tract infection)   . Dementia   . Bladder cancer     hx of surgery and chemo  . Lung cancer     Status post surgery resection  . Diverticulitis   . Depression 08/19/2014   Past Surgical History  Procedure Laterality Date  . Hernia repair  2006  . Cataract extraction, bilateral  1998, 2000  . Cystectomy  2002  . Lung removal, partial     Family History  Problem Relation Age of Onset  . Cancer Maternal Aunt     unknown  . Cancer Maternal Grandmother     unknown  . Aneurysm Brother   . Stroke Father     cerebral hemorrhage   Social History  Substance Use Topics  . Smoking status: Former Smoker    Types: Cigarettes    Quit date: 10/10/1991  . Smokeless tobacco: Never Used  . Alcohol Use: No    Review of Systems  All other systems reviewed and are negative.     Allergies  Sulfa antibiotics and Sulfonamide derivatives  Home Medications   Prior to Admission medications   Medication Sig Start Date End Date Taking? Authorizing Provider  ALPHA LIPOIC ACID PO Take 200-300 mg by mouth every morning.    Yes  Historical Provider, MD  ascorbic acid (VITAMIN C) 1000 MG tablet Take 1,000 mg by mouth every morning.    Yes Historical Provider, MD  aspirin 81 MG chewable tablet Chew 81 mg by mouth every morning.    Yes Historical Provider, MD  atorvastatin (LIPITOR) 10 MG tablet Take 10 mg by mouth at bedtime.    Yes Historical Provider, MD  buPROPion (WELLBUTRIN) 75 MG tablet Take 75 mg by mouth every morning. daily   Yes Historical Provider, MD  calcium carbonate (OS-CAL - DOSED IN MG OF ELEMENTAL CALCIUM) 1250 MG tablet Take 1 tablet by mouth daily with breakfast.    Yes Historical Provider, MD  cholecalciferol (VITAMIN D) 1000 UNITS tablet Take 1,000 Units by mouth every morning.    Yes Historical Provider, MD  Cyanocobalamin (VITAMIN B 12 PO) Take 500 mg by mouth every morning.    Yes Historical Provider, MD  esomeprazole (NEXIUM) 40 MG capsule Take 40 mg by mouth daily as needed (reflux.).   Yes Historical Provider, MD  hydrocortisone valerate cream (WESTCORT) 0.2 % Apply 1 application topically 2 (two) times daily as needed (itching).    Yes Historical Provider, MD  MELATONIN PO Take 1 tablet by mouth at bedtime as needed (sleep).   Yes Historical  Provider, MD  methenamine (HIPREX) 1 G tablet Take 1 g by mouth 2 (two) times daily.   Yes Historical Provider, MD  metoprolol succinate (TOPROL-XL) 25 MG 24 hr tablet Take 25 mg by mouth daily.   Yes Historical Provider, MD  nitrofurantoin (MACRODANTIN) 100 MG capsule Take 100 mg by mouth every evening. 10/17/14  Yes Historical Provider, MD  Probiotic Product (PROBIOTIC DAILY PO) Take 460 mg by mouth every morning.    Yes Historical Provider, MD  sertraline (ZOLOFT) 100 MG tablet Take 100 mg by mouth daily.   Yes Historical Provider, MD  Vancomycin (VANCOCIN) 750 MG/150ML SOLN Inject 150 mLs (750 mg total) into the vein every 12 (twelve) hours. 10/27/14  Yes Belkys A Regalado, MD  vancomycin 1,250 mg in sodium chloride 0.9 % 150 mL Inject 1,250 mg into the vein  daily.   Yes Historical Provider, MD  vancomycin 1,500 mg in sodium chloride 0.9 % 250 mL Inject 1,500 mg into the vein daily. Infuse iv over approximately 85 minutes via home pump every 24 hours as directed   Yes Historical Provider, MD  ibuprofen (ADVIL,MOTRIN) 400 MG tablet Take 1 tablet (400 mg total) by mouth every 6 (six) hours as needed. Take 3 times daily for 3 days, then use every 6 hours as needed. Patient not taking: Reported on 10/24/2014 08/22/14   Eugenie Filler, MD   BP 159/82 mmHg  Pulse 71  Temp(Src) 98.3 F (36.8 C) (Oral)  Resp 20  SpO2 96% Physical Exam  Constitutional: He is oriented to person, place, and time. He appears well-developed and well-nourished. No distress.  HENT:  Head: Normocephalic and atraumatic.  Right Ear: External ear normal.  Left Ear: External ear normal.  Mouth/Throat: Oropharynx is clear and moist.  Eyes: Conjunctivae are normal. Right eye exhibits no discharge. Left eye exhibits no discharge. No scleral icterus.  Neck: Neck supple. No tracheal deviation present.  Cardiovascular: Normal rate, regular rhythm and intact distal pulses.   Pulmonary/Chest: Effort normal and breath sounds normal. No stridor. No respiratory distress. He has no wheezes. He has no rales.  Abdominal: Soft. Bowel sounds are normal. He exhibits no distension. There is no tenderness. There is no rebound and no guarding.  Musculoskeletal: He exhibits no edema or tenderness.  Neurological: He is alert and oriented to person, place, and time. He has normal strength. No cranial nerve deficit (Appears to have a left-sided facial droop at rest but a normal smile, extraocular movements intact, tongue midline  ) or sensory deficit. He exhibits normal muscle tone. He displays no seizure activity. Coordination normal.  No pronator drift bilateral upper extrem, able to hold both legs off bed for 5 seconds, sensation intact in all extremities, no visual field cuts, no left or right sided  neglect, normal finger-nose exam bilaterally, no nystagmus noted   Skin: Skin is warm and dry. No rash noted.  Psychiatric: He has a normal mood and affect.  Nursing note and vitals reviewed.   ED Course  Procedures (including critical care time) Labs Review Labs Reviewed  CBC - Abnormal; Notable for the following:    WBC 11.0 (*)    All other components within normal limits  DIFFERENTIAL - Abnormal; Notable for the following:    Neutro Abs 8.1 (*)    Monocytes Absolute 1.2 (*)    All other components within normal limits  COMPREHENSIVE METABOLIC PANEL - Abnormal; Notable for the following:    Glucose, Bld 107 (*)  BUN 23 (*)    Creatinine, Ser 1.39 (*)    ALT 14 (*)    GFR calc non Af Amer 46 (*)    GFR calc Af Amer 53 (*)    All other components within normal limits  URINALYSIS, ROUTINE W REFLEX MICROSCOPIC (NOT AT [facility]) - Abnormal; Notable for the following:    Color, Urine AMBER (*)    APPearance TURBID (*)    Hgb urine dipstick LARGE (*)    Protein, ur 30 (*)    Leukocytes, UA LARGE (*)    All other components within normal limits  URINE MICROSCOPIC-ADD ON - Abnormal; Notable for the following:    Bacteria, UA FEW (*)    All other components within normal limits  I-STAT CHEM 8, ED - Abnormal; Notable for the following:    BUN 25 (*)    Creatinine, Ser 1.40 (*)    Glucose, Bld 102 (*)    All other components within normal limits  URINE CULTURE  ETHANOL  PROTIME-INR  APTT  URINE RAPID DRUG SCREEN, HOSP PERFORMED  I-STAT TROPOININ, ED    Imaging Review Ct Head Wo Contrast  11/07/2014   CLINICAL DATA:  Left facial droop frequent falls, history of lung cancer bladder cancer  EXAM: CT HEAD WITHOUT CONTRAST  TECHNIQUE: Contiguous axial images were obtained from the base of the skull through the vertex without intravenous contrast.  COMPARISON:  08/19/2014  FINDINGS: Patient is scanned off axis. No skull fracture. Calvarium intact. Severe diffuse atrophy and low  attenuation in the deep white matter stable. No evidence of acute vascular territory infarct or mass. No hemorrhage or extra-axial fluid.  IMPRESSION: Severe involutional change with no acute findings   Electronically Signed   By: Skipper Cliche M.D.   On: 11/07/2014 15:01   I have personally reviewed and evaluated these images and lab results as part of my medical decision-making.   EKG Interpretation   Date/Time:  Friday November 07 2014 13:07:12 EDT Ventricular Rate:  72 PR Interval:    QRS Duration: 104 QT Interval:  432 QTC Calculation: 473 R Axis:   -28 Text Interpretation:  Atrial fibrillation Inferior infarct, old No  significant change since last tracing Confirmed by KNAPP  MD-J, JON  ([id number]) on 11/07/2014 1:29:39 PM      MDM   Final diagnoses:  UTI (lower urinary tract infection)    The patient has no demonstrable weakness on my exam. He is not demonstrating any signs of an acute stroke. He does have some slight facial asymmetry at rest but has no difficulty with smiling and has no other deficits. I doubt that he has had a stroke or TIA. Patient does have a persistent urinary tract infection. Reviewed his prior laboratory tests. He has enterococcus that is sensitive to linezolid and vancomycin. Patient is on home IV vancomycin. He was told recently that his levels were low and they are increasing the dose. This may be why he has a persistent infection.  I do not feel the patient requires hospital admission. He is otherwise stable. Discussed these findings with the patient and his family members. They're comfortable with her outpatient follow-up.   Dorie Rank, MD 11/07/14 [phone]

## 2014-11-07 NOTE — ED Notes (Signed)
Per pt and caregiver, pt from home.  Pt c/o nausea today and off balance.  Pt has dx UTI and is on antibiotics.  Pt has PICC line for home antibiotics.  Pt had fall Wednesday night striking head.  Not checked.  Pt has facial droop to left side.  Pt has hx of stroke but family states looks different.  Unknown for when new symptoms started.  ? Today.

## 2014-11-07 NOTE — ED Notes (Signed)
Notified Dr. Tomi Bamberger of patient symptoms.

## 2014-11-07 NOTE — Discharge Instructions (Signed)

## 2014-11-10 DIAGNOSIS — F329 Major depressive disorder, single episode, unspecified: Secondary | ICD-10-CM | POA: Diagnosis not present

## 2014-11-10 DIAGNOSIS — I10 Essential (primary) hypertension: Secondary | ICD-10-CM | POA: Diagnosis not present

## 2014-11-10 DIAGNOSIS — Z452 Encounter for adjustment and management of vascular access device: Secondary | ICD-10-CM | POA: Diagnosis not present

## 2014-11-10 DIAGNOSIS — N390 Urinary tract infection, site not specified: Secondary | ICD-10-CM | POA: Diagnosis not present

## 2014-11-10 DIAGNOSIS — Z792 Long term (current) use of antibiotics: Secondary | ICD-10-CM | POA: Diagnosis not present

## 2014-11-10 DIAGNOSIS — B952 Enterococcus as the cause of diseases classified elsewhere: Secondary | ICD-10-CM | POA: Diagnosis not present

## 2014-11-10 DIAGNOSIS — G9340 Encephalopathy, unspecified: Secondary | ICD-10-CM | POA: Diagnosis not present

## 2014-11-10 LAB — URINE CULTURE: Culture: >=100000

## 2014-11-11 ENCOUNTER — Other Ambulatory Visit: Payer: Self-pay | Admitting: *Deleted

## 2014-11-11 ENCOUNTER — Telehealth (HOSPITAL_COMMUNITY): Payer: Self-pay

## 2014-11-11 DIAGNOSIS — I10 Essential (primary) hypertension: Secondary | ICD-10-CM | POA: Diagnosis not present

## 2014-11-11 DIAGNOSIS — Z452 Encounter for adjustment and management of vascular access device: Secondary | ICD-10-CM | POA: Diagnosis not present

## 2014-11-11 DIAGNOSIS — G9340 Encephalopathy, unspecified: Secondary | ICD-10-CM | POA: Diagnosis not present

## 2014-11-11 DIAGNOSIS — B952 Enterococcus as the cause of diseases classified elsewhere: Secondary | ICD-10-CM | POA: Diagnosis not present

## 2014-11-11 DIAGNOSIS — F329 Major depressive disorder, single episode, unspecified: Secondary | ICD-10-CM | POA: Diagnosis not present

## 2014-11-11 DIAGNOSIS — N390 Urinary tract infection, site not specified: Secondary | ICD-10-CM | POA: Diagnosis not present

## 2014-11-11 NOTE — Patient Outreach (Signed)
[facility]) Care Management  11/11/2014  DAVONN FLANERY Dec 12, 1930 [id number] [facility]) Care Management  [place]  11/11/2014   CRISTAN SCHERZER 11/09/1930 [id number]   Transition of Care Call  Subjective: Telephone call to Mr. [name] home, I was able to speak with Marc Morgans his grand daughter and caregiver that he lives with. [name] discussed that she took  Mr. [name] to the emergency room on Friday, 16, because she felt like the left side of his face looked drooped, and was concerned because he had a fall earlier in the week in which he hit his head, patient was later discharged, no acute finding per CT of head. [name] also discussed that Mr.[name] has had a recent office visit with Dr.Shaw and the Mr.[name] still has a UTI, and been started on a antibiotic pill, she states Levaquin. [name] reports that [facility] plans to remove PICC line on this week.   Current Medications:  Current Outpatient Prescriptions  Medication Sig Dispense Refill  . ALPHA LIPOIC ACID PO Take 200-300 mg by mouth every morning.     Marland Kitchen ascorbic acid (VITAMIN C) 1000 MG tablet Take 1,000 mg by mouth every morning.     Marland Kitchen aspirin 81 MG chewable tablet Chew 81 mg by mouth every morning.     Marland Kitchen atorvastatin (LIPITOR) 10 MG tablet Take 10 mg by mouth at bedtime.     Marland Kitchen buPROPion (WELLBUTRIN) 75 MG tablet Take 75 mg by mouth every morning. daily    . calcium carbonate (OS-CAL - DOSED IN MG OF ELEMENTAL CALCIUM) 1250 MG tablet Take 1 tablet by mouth daily with breakfast.     . cholecalciferol (VITAMIN D) 1000 UNITS tablet Take 1,000 Units by mouth every morning.     . Cyanocobalamin (VITAMIN B 12 PO) Take 500 mg by mouth every morning.     Marland Kitchen esomeprazole (NEXIUM) 40 MG capsule Take 40 mg by mouth daily as needed (reflux.).    Marland Kitchen hydrocortisone valerate cream (WESTCORT) 0.2 % Apply 1 application topically 2 (two) times daily as  needed (itching).     Marland Kitchen ibuprofen (ADVIL,MOTRIN) 400 MG tablet Take 1 tablet (400 mg total) by mouth every 6 (six) hours as needed. Take 3 times daily for 3 days, then use every 6 hours as needed. (Patient not taking: Reported on 10/24/2014) 30 tablet 0  . MELATONIN PO Take 1 tablet by mouth at bedtime as needed (sleep).    . methenamine (HIPREX) 1 G tablet Take 1 g by mouth 2 (two) times daily.    . metoprolol succinate (TOPROL-XL) 25 MG 24 hr tablet Take 25 mg by mouth daily.    . nitrofurantoin (MACRODANTIN) 100 MG capsule Take 100 mg by mouth every evening.  12  . Probiotic Product (PROBIOTIC DAILY PO) Take 460 mg by mouth every morning.     . sertraline (ZOLOFT) 100 MG tablet Take 100 mg by mouth daily.    . Vancomycin (VANCOCIN) 750 MG/150ML SOLN Inject 150 mLs (750 mg total) into the vein every 12 (twelve) hours. 1500 mL 0  . vancomycin 1,250 mg in sodium chloride 0.9 % 150 mL Inject 1,250 mg into the vein daily.    . vancomycin 1,500 mg in sodium chloride 0.9 % 250 mL Inject 1,500 mg into the vein daily. Infuse iv over approximately 85 minutes via home pump every 24 hours as directed     No current facility-administered medications for this visit.  Functional Status:  In your present state of health, do you have any difficulty performing the following activities: 10/24/2014 09/23/2014  Hearing? N Y  Vision? N N  Difficulty concentrating or making decisions? [name]  Walking or climbing stairs? Y Y  Dressing or bathing? Y Y  Doing errands, shopping? [name]  Preparing Food and eating ? - Y  Using the Toilet? - N  In the past six months, have you accidently leaked urine? - Y  Do you have problems with loss of bowel control? - N  Managing your Medications? - Y  Managing your Finances? - Y  Housekeeping or managing your Housekeeping? - Y    Fall/Depression Screening: PHQ 2/9 Scores 09/23/2014 06/18/2014 06/09/2014  PHQ - 2 Score 0 0 -  Exception Documentation - - Patient refusal     Assessment:  Falls: [name] continues to experience falls, caregiver reports most of the falls occur during the night when the patient is going to the bathroom. [name] bathroom at home is not located near his room, he has to walk from his bedroom into his living area then to the bathroom. I discussed with [name] encouraging the patient to try and use the bedside commode to decrease the distance of walking during the night she agreed to work on this. [name] has also added additional personal care hours to [name] care, home instead PCS is now with the patient 9 am to 1 pm, and then 4 pm to 8 pm, this additional help has been very beneficial to his care per [place].  UTI: [name] reports that [name] is taking all of medication as prescribed including the newly prescribed antibiotics.   Plan:  Continue transition of care program Home visit scheduled for Tuesday, September 27   [facility] CM Care Plan Problem One        Most Recent Value   Care Plan Problem One  Frequent Hall Admissions related to Urinary Tract Infections   Role Documenting the Problem One  Care Management Coordinator   Care Plan for Problem One  Active   [facility] Long Term Goal (31-90 days)  Patient will not experience hospitalization related to UTI in the next 31 days   [facility] Long Term Goal Start Date  10/28/14 [not met goal date restarted]   Interventions for Problem One Long Term Goal  Discussed importance of notifying RNCM, PCP regarding concern related to falls or UTI symptoms, Discussed importance of administering antibiotics as prescibed and until completed [no new signs of UTI ]   [facility] CM Short Term Goal #1 (0-30 days)  Caregivers will be able to state  2 early signs and symptoms of Urinary tract infection and actions to take   [facility] CM Short Term Goal #1 Start Date  09/23/14   [facility] CM Short Term Goal #1 Met Date  10/07/14   [facility] CM Short Term Goal #2 (0-30 days)  Caregivers will be able to report  Urinary tract prevention measures   [facility] CM Short Term Goal #2 Start Date  09/23/14   Interventions for Short Term Goal #2  Discussed importance of patient handwashing, place a sign in the bathroom as a reminder    [facility] CM Care Plan Problem Two        Most Recent Value   Care Plan Problem Two  Frequent Falls   Role Documenting the Problem Two  Care Management [place] for Problem Two  Active   Interventions for Problem Two Long Term Goal  Discussed with caregiver,trying to use BSC during the day, to help encourage use at night since most fall occur with patient trying to get to the bathroom at night   [name] Long Term Goal (31-90) days  Patient will be able to report no falls with injury for next 31 days   [facility] Long Term Goal Start Date  10/28/14   [facility] CM Short Term Goal #1 (0-30 days)  Patient will utiilize walker during ambulation at all  times   [facility] CM Short Term Goal #1 Start Date  09/18/14   [facility] CM Short Term Goal #1 Met Date   10/07/14   [facility] CM Short Term Goal #2 (0-30 days)  Patient will be able to report a walking routine in home at least 3 days per week for the next 30 days    [facility] CM Short Term Goal #2 Start Date  09/23/14   [facility] CM Short Term Goal #2 Met Date  10/07/14 [name] with Personal care assistant daily]     Joylene Draft, [place], [place] Care Management [phone]

## 2014-11-11 NOTE — Progress Notes (Signed)
ED Antimicrobial Stewardship Positive Culture Follow Up   [name] is an 81 y.o. male who presented to [facility] on 11/07/2014 with a chief complaint of  Chief Complaint  Patient presents with  . Facial Droop  . Fall    Recent Results (from the past 720 hour(s))  Urine culture     Status: None   Collection Time: 10/24/14  2:32 PM  Result Value Ref Range Status   Specimen Description URINE, CATHETERIZED  Final   Special Requests NONE  Final   Culture   Final    >=100,000 COLONIES/mL ENTEROCOCCUS SPECIES Performed at [facility]    Report Status 10/26/2014 FINAL  Final   Organism ID, Bacteria ENTEROCOCCUS SPECIES  Final      Susceptibility   Enterococcus species - MIC*    AMPICILLIN >=32 RESISTANT Resistant     LEVOFLOXACIN >=8 RESISTANT Resistant     NITROFURANTOIN 256 RESISTANT Resistant     VANCOMYCIN <=0.5 SENSITIVE Sensitive     LINEZOLID 2 SENSITIVE Sensitive     * >=100,000 COLONIES/mL ENTEROCOCCUS SPECIES  Urine culture     Status: None   Collection Time: 11/07/14  3:02 PM  Result Value Ref Range Status   Specimen Description Urine  Final   Special Requests NONE  Final   Culture   Final    >=100,000 COLONIES/mL ACINETOBACTER CALCOACETICUS/BAUMANNII COMPLEX Performed at [facility]    Report Status 11/10/2014 FINAL  Final   Organism ID, Bacteria ACINETOBACTER CALCOACETICUS/BAUMANNII COMPLEX  Final      Susceptibility   Acinetobacter calcoaceticus/baumannii complex - MIC*    CEFTAZIDIME 4 SENSITIVE Sensitive     CEFTRIAXONE 16 INTERMEDIATE Intermediate     CIPROFLOXACIN 1 SENSITIVE Sensitive     GENTAMICIN <=1 SENSITIVE Sensitive     IMIPENEM <=0.25 SENSITIVE Sensitive     PIP/TAZO <=4 SENSITIVE Sensitive     TRIMETH/SULFA <=20 SENSITIVE Sensitive     AMPICILLIN/SULBACTAM <=2 SENSITIVE Sensitive     * >=100,000 COLONIES/mL ACINETOBACTER CALCOACETICUS/BAUMANNII COMPLEX    '[x]'$  Treated with nitrofurantion prophylaxis, which does not  cover organism  **Stop nitrofurantion New antibiotic prescription: Ciprofloxacin 500 mg PO BID x 7 days Restart nitrofurantion 100 mg PO every evening 1 week after finishing full course of ciprofloxacin.  ED Provider: Okey Regal, PA-C   Joya San, PharmD Clinical Pharmacy Resident Pager # [phone] 11/11/2014 9:25 AM    Infectious Diseases Pharmacist Phone# [phone]

## 2014-11-11 NOTE — Telephone Encounter (Signed)
Post ED Visit - Positive Culture Follow-up: Chart Hand-off to ED Flow Manager  Culture assessed and recommendations reviewed by: '[]'$  Levester Fresh, Pharm.D., BCPS '[]'$  Heide Guile, Pharm.D., BCPS-AQ ID '[]'$  Alycia Rossetti, Pharm.D., BCPS '[]'$  [place], Pharm.D., BCPS, AAHIVP '[]'$  Legrand Como, Pharm .D., BCPS, AAHIVP '[]'$  Milus Glazier, Pharm.D. '[]'$  Dimitri Ped, Pharm.D.  Positive urine culture  '[]'$  Patient discharged without antimicrobial prescription and treatment is now indicated '[x]'$  Organism is resistant to prescribed ED discharge antimicrobial '[]'$  Patient with positive blood cultures  Changes discussed with ED provider: hedges New antibiotic prescription Stop nitrofurantoin. Start cipro '500mg'$  po bid x 7 days. Then restart nitrofurantion '100mg'$  daidly 1 week after stopping cipro.  Attempting to contact pt Ileene Musa 11/11/2014, 5:52 PM

## 2014-11-12 ENCOUNTER — Telehealth (HOSPITAL_COMMUNITY): Payer: Self-pay | Admitting: Emergency Medicine

## 2014-11-13 DIAGNOSIS — I10 Essential (primary) hypertension: Secondary | ICD-10-CM | POA: Diagnosis not present

## 2014-11-13 DIAGNOSIS — B952 Enterococcus as the cause of diseases classified elsewhere: Secondary | ICD-10-CM | POA: Diagnosis not present

## 2014-11-13 DIAGNOSIS — G9340 Encephalopathy, unspecified: Secondary | ICD-10-CM | POA: Diagnosis not present

## 2014-11-13 DIAGNOSIS — N390 Urinary tract infection, site not specified: Secondary | ICD-10-CM | POA: Diagnosis not present

## 2014-11-13 DIAGNOSIS — F329 Major depressive disorder, single episode, unspecified: Secondary | ICD-10-CM | POA: Diagnosis not present

## 2014-11-13 DIAGNOSIS — Z452 Encounter for adjustment and management of vascular access device: Secondary | ICD-10-CM | POA: Diagnosis not present

## 2014-11-13 NOTE — Telephone Encounter (Signed)
Positive urine culture Acinobacter, Orders were to stop Nitrofurantoin and start cipro '500mg'$  po bid x 7 days then restart nitrofurantoin for prophylaxis. Spoke with caregiver Nevin Bloodgood, granddaughter at work and not available, pt referred me to his caregiver. Caregiver stated that pt is on Levaquin '500mg'$  po daily x 10 days started on 11/10/14, per Dr. Brigitte Pulse, consulted via telephone with Heide Guile clinical pharmacist and orders changed to continue the levaquin x 10 days then resume nitrofurantoin per Dr. Brigitte Pulse q nightly, above explained to caregiver to notify granddaughter. c

## 2014-11-14 DIAGNOSIS — N1330 Unspecified hydronephrosis: Secondary | ICD-10-CM | POA: Diagnosis not present

## 2014-11-14 DIAGNOSIS — N390 Urinary tract infection, site not specified: Secondary | ICD-10-CM | POA: Diagnosis not present

## 2014-11-18 ENCOUNTER — Other Ambulatory Visit: Payer: Self-pay | Admitting: *Deleted

## 2014-11-18 DIAGNOSIS — N1330 Unspecified hydronephrosis: Secondary | ICD-10-CM | POA: Diagnosis not present

## 2014-11-18 NOTE — Patient Outreach (Signed)
[facility]) Care Management   11/18/2014  [name] 03-Mar-1930 [id number]  SAABIR BLYTH is an 81 y.o. male  Subjective: "I'm doing all right now", Patient's grand daughter and care giver reports that Mr.Curran pulled out his foley catheter last night, that was placed in on Friday, and they returned this am to the urologist to have it replaced.  Objective:   Review of Systems  Constitutional: Negative.   HENT: Negative.   Eyes: Negative.   Cardiovascular: Negative.   Gastrointestinal: Negative.   Musculoskeletal: Negative.   Skin: Negative.        Abrasion to lower back  Neurological: Negative.   Psychiatric/Behavioral:       Difficulty recalling recent events    Physical Exam  Constitutional: He appears well-developed and well-nourished.  Cardiovascular: Normal rate and normal heart sounds.   Respiratory: Effort normal and breath sounds normal.  GI: Soft.  Genitourinary:  Patient has a indwelling foley catheter  Musculoskeletal:  Shuffling gait  Neurological: He is alert.  Alert oriented to person place, time when looking at clock,   Skin: Skin is warm and dry.  Lower back with reddish discoloration and scraped appearance , intact occurred at recent fall. Buttock folds redden  Psychiatric: He has a normal mood and affect.  Patient forgetful,but able to recall his fall on last night   BP 112/70 mmHg  Pulse 68  Resp 18  SpO2 97% Current Medications:   Current Outpatient Prescriptions  Medication Sig Dispense Refill  . ALPHA LIPOIC ACID PO Take 200-300 mg by mouth every morning.     Marland Kitchen ascorbic acid (VITAMIN C) 1000 MG tablet Take 1,000 mg by mouth every morning.     Marland Kitchen aspirin 81 MG chewable tablet Chew 81 mg by mouth every morning.     Marland Kitchen atorvastatin (LIPITOR) 10 MG tablet Take 10 mg by mouth at bedtime.     Marland Kitchen buPROPion (WELLBUTRIN) 75 MG tablet Take 75 mg by mouth every morning. daily    . calcium carbonate (OS-CAL - DOSED IN  MG OF ELEMENTAL CALCIUM) 1250 MG tablet Take 1 tablet by mouth daily with breakfast.     . cholecalciferol (VITAMIN D) 1000 UNITS tablet Take 1,000 Units by mouth every morning.     . Cyanocobalamin (VITAMIN B 12 PO) Take 500 mg by mouth every morning.     Marland Kitchen esomeprazole (NEXIUM) 40 MG capsule Take 40 mg by mouth daily as needed (reflux.).    Marland Kitchen hydrocortisone valerate cream (WESTCORT) 0.2 % Apply 1 application topically 2 (two) times daily as needed (itching).     Marland Kitchen levofloxacin (LEVAQUIN) 500 MG tablet Take 500 mg by mouth daily.    Marland Kitchen MELATONIN PO Take 1 tablet by mouth at bedtime as needed (sleep).    . methenamine (HIPREX) 1 G tablet Take 1 g by mouth 2 (two) times daily.    . metoprolol succinate (TOPROL-XL) 25 MG 24 hr tablet Take 25 mg by mouth daily.    . nitrofurantoin (MACRODANTIN) 100 MG capsule Take 100 mg by mouth every evening.  12  . Probiotic Product (PROBIOTIC DAILY PO) Take 460 mg by mouth every morning.     . sertraline (ZOLOFT) 100 MG tablet Take 100 mg by mouth daily.    Marland Kitchen ibuprofen (ADVIL,MOTRIN) 400 MG tablet Take 1 tablet (400 mg total) by mouth every 6 (six) hours as needed. Take 3 times daily for 3 days, then use every 6 hours as needed. (Patient not  taking: Reported on 10/24/2014) 30 tablet 0  . Vancomycin (VANCOCIN) 750 MG/150ML SOLN Inject 150 mLs (750 mg total) into the vein every 12 (twelve) hours. (Patient not taking: Reported on 11/18/2014) 1500 mL 0  . vancomycin 1,250 mg in sodium chloride 0.9 % 150 mL Inject 1,250 mg into the vein daily.    . vancomycin 1,500 mg in sodium chloride 0.9 % 250 mL Inject 1,500 mg into the vein daily. Infuse iv over approximately 85 minutes via home pump every 24 hours as directed     No current facility-administered medications for this visit.    Functional Status:   In your present state of health, do you have any difficulty performing the following activities: 11/18/2014 10/24/2014  Hearing? Y N  Vision? N N  Difficulty  concentrating or making decisions? [name]  Walking or climbing stairs? Y Y  Dressing or bathing? Y Y  Doing errands, shopping? [name]  Preparing Food and eating ? Y -  Using the Toilet? Y -  In the past six months, have you accidently leaked urine? Y -  Do you have problems with loss of bowel control? N -  Managing your Medications? N -  Managing your Finances? Y -  Housekeeping or managing your Housekeeping? Y -    Fall/Depression Screening:    PHQ 2/9 Scores 11/18/2014 09/23/2014 06/18/2014 06/09/2014  PHQ - 2 Score 0 0 0 -  Exception Documentation - - - Patient refusal    Assessment:   Frequent Falls: Mr. Ricardo continues to experience falls mostly at night when trying to get to the bathroom. His most recent fall was last night, noted scraped area on lower back skin intact. Mr.Wickstrom has a personal care assistant 10 am to 1 pm and 4 pm to 8 pm, no falls have occurred during this time. Mr.Lippert gait is wobbly, and has a shuffling gait, family reports that he often declines walks because he afraid of falling. Encouraged to try chair exercises. Frequent Urinary tract infections: Patient now with indwelling foley catheter in place, so now he no longer has to do bladder irrigations or self cath daily. Hopefully this will decrease occurrence of UTI. Mr.Peralta has one dose left to take of his antibiotic Levaquin for UTI treatment. [name] voiced concern regarding, patient in redden between buttock folds, discussed importance of keeping patient dry,since foley catheter in place try leaving depends off during the day while personal care assist is present, apply barrier cream, avoid sitting long periods, assist patient with standing at least every 2 hours during the day. Mr.Sherrin has a good appetite.  Plan: Continue transition of care call on next week   [facility] CM Care Plan Problem One        Most Recent Value   Care Plan Problem One  Frequent hospital Admissions related to Urinary  Tract Infections   Role Documenting the Problem One  Care Management [place] for Problem One  Active   [facility]N Long Term Goal (31-90 days)  Patient will not experience hospitalization related to UTI in the next 31 days   [facility] Long Term Goal Start Date  10/28/14 [not met goal date restarted]   Interventions for Problem One Long Term Goal  Discussed importance of proper hygiene with foley in, Notify MD of concerns related to foley  [no new signs of UTI ]   [facility] CM Short Term Goal #1 (0-30 days)  Caregivers will be able to state  2 early signs and  symptoms of Urinary tract infection and actions to take   [facility] CM Short Term Goal #1 Start Date  09/23/14   [facility] CM Short Term Goal #1 Met Date  10/07/14   [facility] CM Short Term Goal #2 (0-30 days)  Caregivers will be able to report Urinary tract prevention measures   [facility] CM Short Term Goal #2 Start Date  09/23/14   [facility] CM Short Term Goal #2 Met Date  11/18/14    [facility] CM Care Plan Problem Two        Most Recent Value   Care Plan Problem Two  Frequent Falls   Role Documenting the Problem Two  Care Management Coordinator   Care Plan for Problem Two  Active   Interventions for Problem Two Long Term Goal   Discussed with caregiver the use of a bed alarm for night time since most falls occur at that time   [facility] Long Term Goal (31-90) days  Patient will be able to report no falls with injury for next 31 days   [facility]N Long Term Goal Start Date  10/28/14   [facility] CM Short Term Goal #1 (0-30 days)  Patient will utiilize walker during ambulation at all  times   [facility] CM Short Term Goal #1 Start Date  09/18/14   [facility] CM Short Term Goal #1 Met Date   10/07/14   [facility] CM Short Term Goal #2 (0-30 days)  Patient will be able to report a walking routine in home at least 3 days per week for the next 30 days    [facility] CM Short Term Goal #2 Start Date  09/23/14   [facility] CM Short Term Goal #2 Met Date  10/07/14 Linus Mako with Personal care assistant daily]     Joylene Draft, [place],  [place] Care Management [phone]

## 2014-11-25 ENCOUNTER — Other Ambulatory Visit: Payer: Self-pay | Admitting: *Deleted

## 2014-11-25 DIAGNOSIS — R269 Unspecified abnormalities of gait and mobility: Secondary | ICD-10-CM | POA: Diagnosis not present

## 2014-11-25 DIAGNOSIS — R296 Repeated falls: Secondary | ICD-10-CM | POA: Diagnosis not present

## 2014-11-25 DIAGNOSIS — F0390 Unspecified dementia without behavioral disturbance: Secondary | ICD-10-CM | POA: Diagnosis not present

## 2014-11-25 DIAGNOSIS — Z6827 Body mass index (BMI) 27.0-27.9, adult: Secondary | ICD-10-CM | POA: Diagnosis not present

## 2014-11-25 DIAGNOSIS — N390 Urinary tract infection, site not specified: Secondary | ICD-10-CM | POA: Diagnosis not present

## 2014-11-25 DIAGNOSIS — B952 Enterococcus as the cause of diseases classified elsewhere: Secondary | ICD-10-CM | POA: Diagnosis not present

## 2014-11-25 DIAGNOSIS — I480 Paroxysmal atrial fibrillation: Secondary | ICD-10-CM | POA: Diagnosis not present

## 2014-11-25 DIAGNOSIS — N183 Chronic kidney disease, stage 3 (moderate): Secondary | ICD-10-CM | POA: Diagnosis not present

## 2014-11-25 NOTE — Patient Outreach (Signed)
[facility]) Care Management  11/25/2014  HUY MAJID December 13, 1930 [id number]   Transition of Care Spoke with [name], Granddaughter, POA that [name] resides with. Subjective: [name] reports that [name] has been doing better.                        Assessment: Falls: Reported decreased falls, [name] states that she has checked into  bed and chair alarms to increase safety and fall prevention and awaiting follow up from [facility]. Recent UTI: Patient remains with foley catheter in place without concerns.Denies signs or symptoms of UTI.   She also reports healing at redden area between buttocks.  [name] denies further problems or concerns Plan: Will schedule home visit for October Encouraged [name] to continue to follow up regarding bed and chair to increase safety and fall prevention.    [facility] CM Care Plan Problem One        Most Recent Value   Care Plan Problem One  Frequent hospital Admissions related to Urinary Tract Infections   Role Documenting the Problem One  Care Management Coordinator   Care Plan for Problem One  Active   [facility]N Long Term Goal (31-90 days)  Patient will not experience hospitalization related to UTI in the next 31 days   [facility] Long Term Goal Start Date  10/28/14 [not met goal date restarted]   Interventions for Problem One Long Term Goal  Discussed importance of proper hygiene with foley in, Notify MD of concerns related to foley  [no new signs of UTI ]   [facility] CM Short Term Goal #1 (0-30 days)  Caregivers will be able to state  2 early signs and symptoms of Urinary tract infection and actions to take   [facility] CM Short Term Goal #1 Start Date  09/23/14   [facility] CM Short Term Goal #1 Met Date  10/07/14   [facility] CM Short Term Goal #2 (0-30 days)  Caregivers will be able to report Urinary tract prevention measures   [facility] CM Short Term Goal #2 Start Date  09/23/14   [facility] CM Short Term Goal #2 Met Date  11/18/14     [facility] CM Care Plan Problem Two        Most Recent Value   Care Plan Problem Two  Frequent Falls   Role Documenting the Problem Two  Care Management Coordinator   Care Plan for Problem Two  Active   Interventions for Problem Two Long Term Goal   Encouraged caregiver to continue follow through with obtaining bed and chair alarm   [facility]N Long Term Goal (31-90) days  Patient will be able to report no falls with injury for next 31 days   [facility]N Long Term Goal Start Date  10/28/14   [facility] CM Short Term Goal #1 (0-30 days)  Patient will utiilize walker during ambulation at all  times   [facility] CM Short Term Goal #1 Start Date  09/18/14   [facility] CM Short Term Goal #1 Met Date   10/07/14   [facility] CM Short Term Goal #2 (0-30 days)  Patient will be able to report a walking routine in home at least 3 days per week for the next 30 days    [facility] CM Short Term Goal #2 Start Date  09/23/14   [facility] CM Short Term Goal #2 Met Date  10/07/14 Linus Mako with Personal care assistant daily]     Joylene Draft, [place], [place] Care Management [phone]

## 2014-12-01 DIAGNOSIS — B9689 Other specified bacterial agents as the cause of diseases classified elsewhere: Secondary | ICD-10-CM | POA: Diagnosis not present

## 2014-12-01 DIAGNOSIS — N390 Urinary tract infection, site not specified: Secondary | ICD-10-CM | POA: Diagnosis not present

## 2014-12-04 ENCOUNTER — Other Ambulatory Visit: Payer: Self-pay | Admitting: Urology

## 2014-12-04 DIAGNOSIS — N390 Urinary tract infection, site not specified: Secondary | ICD-10-CM

## 2014-12-05 ENCOUNTER — Ambulatory Visit (HOSPITAL_COMMUNITY)
Admission: RE | Admit: 2014-12-05 | Discharge: 2014-12-05 | Disposition: A | Discharge status: Home / Self Care | Payer: Medicare Other | Source: Ambulatory Visit | Attending: Urology | Admitting: Urology

## 2014-12-05 ENCOUNTER — Other Ambulatory Visit (HOSPITAL_COMMUNITY): Payer: Medicare Other

## 2014-12-05 ENCOUNTER — Other Ambulatory Visit: Payer: Self-pay | Admitting: Urology

## 2014-12-05 DIAGNOSIS — C801 Malignant (primary) neoplasm, unspecified: Secondary | ICD-10-CM | POA: Diagnosis not present

## 2014-12-05 DIAGNOSIS — N390 Urinary tract infection, site not specified: Secondary | ICD-10-CM

## 2014-12-05 DIAGNOSIS — F329 Major depressive disorder, single episode, unspecified: Secondary | ICD-10-CM | POA: Diagnosis not present

## 2014-12-05 DIAGNOSIS — G9340 Encephalopathy, unspecified: Secondary | ICD-10-CM | POA: Diagnosis not present

## 2014-12-05 DIAGNOSIS — I10 Essential (primary) hypertension: Secondary | ICD-10-CM | POA: Diagnosis not present

## 2014-12-05 DIAGNOSIS — Z452 Encounter for adjustment and management of vascular access device: Secondary | ICD-10-CM | POA: Diagnosis not present

## 2014-12-05 DIAGNOSIS — B952 Enterococcus as the cause of diseases classified elsewhere: Secondary | ICD-10-CM | POA: Diagnosis not present

## 2014-12-05 MED ORDER — LIDOCAINE HCL 1 % IJ SOLN
INTRAMUSCULAR | Status: AC
  Filled 2014-12-05: qty 20

## 2014-12-05 MED ORDER — HEPARIN SOD (PORK) LOCK FLUSH 100 UNIT/ML IV SOLN
INTRAVENOUS | Status: AC
  Filled 2014-12-05: qty 5

## 2014-12-05 NOTE — Procedures (Signed)
RUE PICC SVC RA No comp/EBL

## 2014-12-10 DIAGNOSIS — G9340 Encephalopathy, unspecified: Secondary | ICD-10-CM | POA: Diagnosis not present

## 2014-12-10 DIAGNOSIS — I10 Essential (primary) hypertension: Secondary | ICD-10-CM | POA: Diagnosis not present

## 2014-12-10 DIAGNOSIS — B952 Enterococcus as the cause of diseases classified elsewhere: Secondary | ICD-10-CM | POA: Diagnosis not present

## 2014-12-10 DIAGNOSIS — Z452 Encounter for adjustment and management of vascular access device: Secondary | ICD-10-CM | POA: Diagnosis not present

## 2014-12-10 DIAGNOSIS — N390 Urinary tract infection, site not specified: Secondary | ICD-10-CM | POA: Diagnosis not present

## 2014-12-10 DIAGNOSIS — F329 Major depressive disorder, single episode, unspecified: Secondary | ICD-10-CM | POA: Diagnosis not present

## 2014-12-17 DIAGNOSIS — N390 Urinary tract infection, site not specified: Secondary | ICD-10-CM | POA: Diagnosis not present

## 2014-12-17 DIAGNOSIS — Z452 Encounter for adjustment and management of vascular access device: Secondary | ICD-10-CM | POA: Diagnosis not present

## 2014-12-17 DIAGNOSIS — G9340 Encephalopathy, unspecified: Secondary | ICD-10-CM | POA: Diagnosis not present

## 2014-12-17 DIAGNOSIS — B952 Enterococcus as the cause of diseases classified elsewhere: Secondary | ICD-10-CM | POA: Diagnosis not present

## 2014-12-17 DIAGNOSIS — F329 Major depressive disorder, single episode, unspecified: Secondary | ICD-10-CM | POA: Diagnosis not present

## 2014-12-17 DIAGNOSIS — I10 Essential (primary) hypertension: Secondary | ICD-10-CM | POA: Diagnosis not present

## 2014-12-18 ENCOUNTER — Other Ambulatory Visit: Payer: Self-pay | Admitting: *Deleted

## 2014-12-18 NOTE — Patient Outreach (Signed)
[facility]) Care Management   12/18/2014  [name] October 05, 1930 [id number]  [name] is an 81 y.o. male  Subjective: "I am well taken care of"  Objective:   Review of Systems  Constitutional: Negative.   HENT: Negative.   Respiratory: Negative.  Negative for cough.   Cardiovascular: Negative for chest pain.  Musculoskeletal: Positive for falls.  Skin: Negative.   Psychiatric/Behavioral: Positive for memory loss. Negative for depression.    Physical Exam  Constitutional: He appears well-developed and well-nourished.  Cardiovascular: Normal rate and normal heart sounds.   Respiratory: Effort normal and breath sounds normal.  GI: Soft.  Musculoskeletal: He exhibits no edema.  Neurological: He is alert.  Skin: Skin is warm and dry.  tegaderm type dressing to left elbow area skin torn area. Dressing to right upper arm area, recent picc line removal  Psychiatric: He has a normal mood and affect. His behavior is normal. Judgment and thought content normal.  BP 112/70 mmHg  Pulse 62  Resp 18  SpO2 96%  Current Medications:   Current Outpatient Prescriptions  Medication Sig Dispense Refill  . ALPHA LIPOIC ACID PO Take 200-300 mg by mouth every morning.     Marland Kitchen ascorbic acid (VITAMIN C) 1000 MG tablet Take 1,000 mg by mouth every morning.     Marland Kitchen aspirin 81 MG chewable tablet Chew 81 mg by mouth every morning.     Marland Kitchen atorvastatin (LIPITOR) 10 MG tablet Take 10 mg by mouth at bedtime.     Marland Kitchen buPROPion (WELLBUTRIN) 75 MG tablet Take 75 mg by mouth every morning. daily    . calcium carbonate (OS-CAL - DOSED IN MG OF ELEMENTAL CALCIUM) 1250 MG tablet Take 1 tablet by mouth daily with breakfast.     . cholecalciferol (VITAMIN D) 1000 UNITS tablet Take 1,000 Units by mouth every morning.     . Cyanocobalamin (VITAMIN B 12 PO) Take 500 mg by mouth every morning.     Marland Kitchen esomeprazole (NEXIUM) 40 MG capsule Take 40 mg by mouth daily as needed (reflux.).     Marland Kitchen hydrocortisone valerate cream (WESTCORT) 0.2 % Apply 1 application topically 2 (two) times daily as needed (itching).     . MELATONIN PO Take 1 tablet by mouth at bedtime as needed (sleep).    . methenamine (HIPREX) 1 G tablet Take 1 g by mouth 2 (two) times daily.    . metoprolol succinate (TOPROL-XL) 25 MG 24 hr tablet Take 25 mg by mouth daily.    . nitrofurantoin (MACRODANTIN) 100 MG capsule Take 100 mg by mouth every evening.  12  . Probiotic Product (PROBIOTIC DAILY PO) Take 460 mg by mouth every morning.     . sertraline (ZOLOFT) 100 MG tablet Take 100 mg by mouth daily.    Marland Kitchen ibuprofen (ADVIL,MOTRIN) 400 MG tablet Take 1 tablet (400 mg total) by mouth every 6 (six) hours as needed. Take 3 times daily for 3 days, then use every 6 hours as needed. (Patient not taking: Reported on 10/24/2014) 30 tablet 0  . levofloxacin (LEVAQUIN) 500 MG tablet Take 500 mg by mouth daily.    . Vancomycin (VANCOCIN) 750 MG/150ML SOLN Inject 150 mLs (750 mg total) into the vein every 12 (twelve) hours. (Patient not taking: Reported on 11/18/2014) 1500 mL 0  . vancomycin 1,250 mg in sodium chloride 0.9 % 150 mL Inject 1,250 mg into the vein daily.    . vancomycin 1,500 mg in sodium chloride 0.9 %  250 mL Inject 1,500 mg into the vein daily. Infuse iv over approximately 85 minutes via home pump every 24 hours as directed     No current facility-administered medications for this visit.    Functional Status:   In your present state of health, do you have any difficulty performing the following activities: 11/18/2014 10/24/2014  Hearing? Y N  Vision? N N  Difficulty concentrating or making decisions? [name]  Walking or climbing stairs? Y Y  Dressing or bathing? Y Y  Doing errands, shopping? [name]  Preparing Food and eating ? Y -  Using the Toilet? Y -  In the past six months, have you accidently leaked urine? Y -  Do you have problems with loss of bowel control? N -  Managing your Medications? N -  Managing your  Finances? Y -  Housekeeping or managing your Housekeeping? Y -    Fall/Depression Screening:    PHQ 2/9 Scores 11/18/2014 09/23/2014 06/18/2014 06/09/2014  PHQ - 2 Score 0 0 0 -  Exception Documentation - - - Patient refusal    Assessment:   Fall Risk: [name] experienced a fall this week in the middle of the night per [name] his grand daughter, up until this time he had decreased his incidence in falls. Noted bandage to skin torn area at left elbow. [name] reported problems with wheels on his rolling walker wheels getting stuck and she has placed an order for a new walker. I walked with [name] using walker, approximately 20 feet, his gait is wobbly, needs cueing to stay up close to walker and wheels seem stiff moving at times. Discussed with [name] regarding bed alarm, she states that is her plan also. [name] has a personal care aide 10 am to 1pm and 5 pm to 8 pm 5 days per weeks.  Frequent UTI : [name] just completed a 2nd round of home  IV antibiotics and PICC line has been removed by home [facility] on 10/26. He continues with foley catheter in place tolerating well, and it is connected to a leg bag. He has an appointment at Urologist office for catheter change out on 10/28.Discussed daily care with foley catheter  Reminded to contact me , MD for questions or concerns.  Plan:  Caregiver will purchase new walker and follow through on purchasing bed alarm Will schedule home visit for November. Caregiver will perform daily cleaning to catheter site  [facility] CM Care Plan Problem One        Most Recent Value   Care Plan Problem One  Frequent hospital Admissions related to Urinary Tract Infections   Role Documenting the Problem One  Care Management Coordinator   Care Plan for Problem One  Active   [facility]N Long Term Goal (31-90 days)  Patient will not experience hospitalization related to UTI in the next 31 days   [facility] Long Term Goal Start Date  10/28/14  [not met goal date restarted]   [facility] Long Term Goal Met Date  12/18/14   Interventions for Problem One Long Term Goal  -- [no new signs of UTI ]   [facility] CM Short Term Goal #1 (0-30 days)  Caregivers will be able to state  2 early signs and symptoms of Urinary tract infection and actions to take   [facility] CM Short Term Goal #1 Start Date  09/23/14   [facility] CM Short Term Goal #1 Met Date  10/07/14   [facility] CM Short Term Goal #2 (0-30 days)  Caregivers will  be able to report Urinary tract prevention measures   [facility] CM Short Term Goal #2 Start Date  09/23/14   [facility] CM Short Term Goal #2 Met Date  11/18/14   [facility] CM Short Term Goal #3 (0-30 days)  Caregiver will report daily care to catheter site   [facility] CM Short Term Goal #3 Start Date  12/18/14   Interventions for Short Tern Goal #3  Reviewed importance of cleaning at catheter site daily, with soap and water,rinse well    [facility] CM Care Plan Problem Two        Most Recent Value   Care Plan Problem Two  Frequent Falls   Role Documenting the Problem Two  Care Management [place] for Problem Two  Active   Interventions for Problem Two Long Term Goal   Caregiver will address concern regarding wheels on walker getting stuck, new walker on order, also she will pursue bed alarms    [facility]N Long Term Goal (31-90) days  Patient will be able to report no falls with injury for next 31 days   [facility] Long Term Goal Start Date  12/18/14   [facility] CM Short Term Goal #1 (0-30 days)  Patient will utiilize walker during ambulation at all  times   [facility] CM Short Term Goal #1 Start Date  09/18/14   [facility] CM Short Term Goal #1 Met Date   10/07/14   [facility] CM Short Term Goal #2 (0-30 days)  Patient will be able to report a walking routine in home at least 3 days per week for the next 30 days    [facility] CM Short Term Goal #2 Start Date  09/23/14   [facility] CM Short Term Goal #2 Met Date  10/07/14 Linus Mako with Personal care assistant daily]     Joylene Draft, RN, [place]  Management [phone]- Mobile [phone]- [place]

## 2014-12-19 DIAGNOSIS — N390 Urinary tract infection, site not specified: Secondary | ICD-10-CM | POA: Diagnosis not present

## 2014-12-19 DIAGNOSIS — Z8551 Personal history of malignant neoplasm of bladder: Secondary | ICD-10-CM | POA: Diagnosis not present

## 2014-12-24 DIAGNOSIS — G9340 Encephalopathy, unspecified: Secondary | ICD-10-CM | POA: Diagnosis not present

## 2014-12-24 DIAGNOSIS — Z452 Encounter for adjustment and management of vascular access device: Secondary | ICD-10-CM | POA: Diagnosis not present

## 2014-12-24 DIAGNOSIS — I10 Essential (primary) hypertension: Secondary | ICD-10-CM | POA: Diagnosis not present

## 2014-12-24 DIAGNOSIS — B952 Enterococcus as the cause of diseases classified elsewhere: Secondary | ICD-10-CM | POA: Diagnosis not present

## 2014-12-24 DIAGNOSIS — F329 Major depressive disorder, single episode, unspecified: Secondary | ICD-10-CM | POA: Diagnosis not present

## 2014-12-24 DIAGNOSIS — N390 Urinary tract infection, site not specified: Secondary | ICD-10-CM | POA: Diagnosis not present

## 2014-12-30 DIAGNOSIS — N390 Urinary tract infection, site not specified: Secondary | ICD-10-CM | POA: Diagnosis not present

## 2014-12-30 DIAGNOSIS — B9620 Unspecified Escherichia coli [E. coli] as the cause of diseases classified elsewhere: Secondary | ICD-10-CM | POA: Diagnosis not present

## 2015-01-08 ENCOUNTER — Other Ambulatory Visit: Payer: Self-pay | Admitting: *Deleted

## 2015-01-08 NOTE — Patient Outreach (Signed)
[name]) Care Management  [place]  01/08/2015   [name] 03/21/1930 [id number] Spoke with [name], grand daughter , caregiver  Subjective:  [name] reports patient,had some symptoms of urinary tract infection recently but when urine tested he did not require antibiotics.    Objective:   Current Medications:  Current Outpatient Prescriptions  Medication Sig Dispense Refill  . ALPHA LIPOIC ACID PO Take 200-300 mg by mouth every morning.     Marland Kitchen ascorbic acid (VITAMIN C) 1000 MG tablet Take 1,000 mg by mouth every morning.     Marland Kitchen aspirin 81 MG chewable tablet Chew 81 mg by mouth every morning.     Marland Kitchen atorvastatin (LIPITOR) 10 MG tablet Take 10 mg by mouth at bedtime.     Marland Kitchen buPROPion (WELLBUTRIN) 75 MG tablet Take 75 mg by mouth every morning. daily    . calcium carbonate (OS-CAL - DOSED IN MG OF ELEMENTAL CALCIUM) 1250 MG tablet Take 1 tablet by mouth daily with breakfast.     . cholecalciferol (VITAMIN D) 1000 UNITS tablet Take 1,000 Units by mouth every morning.     . Cyanocobalamin (VITAMIN B 12 PO) Take 500 mg by mouth every morning.     Marland Kitchen esomeprazole (NEXIUM) 40 MG capsule Take 40 mg by mouth daily as needed (reflux.).    Marland Kitchen hydrocortisone valerate cream (WESTCORT) 0.2 % Apply 1 application topically 2 (two) times daily as needed (itching).     Marland Kitchen ibuprofen (ADVIL,MOTRIN) 400 MG tablet Take 1 tablet (400 mg total) by mouth every 6 (six) hours as needed. Take 3 times daily for 3 days, then use every 6 hours as needed. (Patient not taking: Reported on 10/24/2014) 30 tablet 0  . levofloxacin (LEVAQUIN) 500 MG tablet Take 500 mg by mouth daily.    Marland Kitchen MELATONIN PO Take 1 tablet by mouth at bedtime as needed (sleep).    . methenamine (HIPREX) 1 G tablet Take 1 g by mouth 2 (two) times daily.    . metoprolol succinate (TOPROL-XL) 25 MG 24 hr tablet Take 25 mg by mouth daily.    . nitrofurantoin (MACRODANTIN) 100 MG capsule Take 100 mg by mouth  every evening.  12  . Probiotic Product (PROBIOTIC DAILY PO) Take 460 mg by mouth every morning.     . sertraline (ZOLOFT) 100 MG tablet Take 100 mg by mouth daily.    . Vancomycin (VANCOCIN) 750 MG/150ML SOLN Inject 150 mLs (750 mg total) into the vein every 12 (twelve) hours. (Patient not taking: Reported on 11/18/2014) 1500 mL 0  . vancomycin 1,250 mg in sodium chloride 0.9 % 150 mL Inject 1,250 mg into the vein daily.    . vancomycin 1,500 mg in sodium chloride 0.9 % 250 mL Inject 1,500 mg into the vein daily. Infuse iv over approximately 85 minutes via home pump every 24 hours as directed     No current facility-administered medications for this visit.    Functional Status:  In your present state of health, do you have any difficulty performing the following activities: 11/18/2014 10/24/2014  Hearing? Y N  Vision? N N  Difficulty concentrating or making decisions? [name]  Walking or climbing stairs? Y Y  Dressing or bathing? Y Y  Doing errands, shopping? [name]  Preparing Food and eating ? Y -  Using the Toilet? Y -  In the past six months, have you accidently leaked urine? Y -  Do you have problems with loss of  bowel control? N -  Managing your Medications? N -  Managing your Finances? Y -  Housekeeping or managing your Housekeeping? Y -    Fall/Depression Screening: PHQ 2/9 Scores 11/18/2014 09/23/2014 06/18/2014 06/09/2014  PHQ - 2 Score 0 0 0 -  Exception Documentation - - - Patient refusal    Assessment:   Falls: Patient continues to experience falls, without injury requiring medical assistance per grand daughter. Falls have decreased somewhat, [name] states that she has purchased a bed alarm and uses it at night. Mr. Gunnels continues to have personal care provider, for at least 7 hours a night split between mid morning to early afternoon and evening. Patient does not experience fall during the time when personal care provider is constantly with him.  Frequent UTI: Patient  continues to have UTI infections symptoms, [name] is very tuned in to changes in condition during this time and follows up promptly with MD.  No other concerns voiced, at this time  Plan:  Review progress toward patient centered goals, evaluate for case closure. Encouraged caregiver to notify MD of concerns related UTI Encouraged to notify MD of falls with concern for injury Follow up with a Home Visit on December 1  [facility] CM Care Plan Problem One        Most Recent Value   Care Plan Problem One  Frequent Hall Admissions related to Urinary Tract Infections   Role Documenting the Problem One  Care Management [place] for Problem One  Active   THN Long Term Goal (31-90 days)  Patient will not experience hospitalization related to UTI in the next 31 days   THN Long Term Goal Start Date  10/28/14 [not met goal date restarted]   [facility] Long Term Goal Met Date  12/18/14   Interventions for Problem One Long Term Goal  -- [no new signs of UTI ]   THN CM Short Term Goal #1 (0-30 days)  Caregivers will be able to state  2 early signs and symptoms of Urinary tract infection and actions to take   [facility] CM Short Term Goal #1 Start Date  09/23/14   [facility] CM Short Term Goal #1 Met Date  10/07/14   THN CM Short Term Goal #2 (0-30 days)  Caregivers will be able to report Urinary tract prevention measures   THN CM Short Term Goal #2 Start Date  09/23/14   [facility] CM Short Term Goal #2 Met Date  11/18/14   THN CM Short Term Goal #3 (0-30 days)  Caregiver will report daily care to catheter site   [facility] CM Short Term Goal #3 Start Date  12/18/14   [facility] CM Short Term Goal #3 Met Date  01/08/15    [facility] CM Care Plan Problem Two        Most Recent Value   Care Plan Problem Two  Frequent Falls   Role Documenting the Problem Two  Care Management Coordinator   Care Plan for Problem Two  Active   Interventions for Problem Two Long Term Goal   Caregiver has purchased Bed alarm, wheelchair   THN Long Term  Goal (31-90) days  Patient will be able to report no falls with injury for next 31 days   THN Long Term Goal Start Date  12/18/14   [facility] Long Term Goal Met Date  01/08/15   THN CM Short Term Goal #1 (0-30 days)  Patient will utiilize walker during ambulation at all  times  THN CM Short Term Goal #1 Start Date  09/18/14   THN CM Short Term Goal #1 Met Date   10/07/14   THN CM Short Term Goal #2 (0-30 days)  Patient will be able to report a walking routine in home at least 3 days per week for the next 30 days    THN CM Short Term Goal #2 Start Date  09/23/14   [facility] CM Short Term Goal #2 Met Date  10/07/14 Linus Mako with Personal care assistant daily]     Joylene Draft, RN, [place] Management [phone]- Mobile [phone]- [place]

## 2015-01-22 ENCOUNTER — Other Ambulatory Visit: Payer: Self-pay | Admitting: *Deleted

## 2015-01-22 NOTE — Patient Outreach (Signed)
[facility]) Care Management   01/23/2015  [name] 02/18/1931 [id number]  [name] is an 81 y.o. male  Subjective: " I am doing great and they are taking great care of me"  Objective:   Review of Systems  Constitutional: Negative.   HENT: Negative.   Eyes: Negative.   Respiratory: Negative.   Cardiovascular: Negative.  Negative for chest pain.  Gastrointestinal: Negative.   Genitourinary:       Foley in place , leg bag  Musculoskeletal: Positive for falls.       General weakness, gait and balance unsteady when standing, walks with rolling walker, and personal assistance  Skin: Negative.   Psychiatric/Behavioral: Positive for memory loss.  BP 132/74 mmHg  Pulse 72  Resp 20  SpO2 97%  Physical Exam  Constitutional: He is oriented to person, place, and time. He appears well-developed and well-nourished.  Cardiovascular: Normal rate.   irregular  Respiratory: Breath sounds normal.  Musculoskeletal:  Unsteady gait and poor  balance   Neurological: He is oriented to person, place, and time.  Skin: Skin is warm and dry.    Current Medications:   Current Outpatient Prescriptions  Medication Sig Dispense Refill  . ALPHA LIPOIC ACID PO Take 200-300 mg by mouth every morning.     Marland Kitchen ascorbic acid (VITAMIN C) 1000 MG tablet Take 1,000 mg by mouth every morning.     Marland Kitchen aspirin 81 MG chewable tablet Chew 81 mg by mouth every morning.     Marland Kitchen atorvastatin (LIPITOR) 10 MG tablet Take 10 mg by mouth at bedtime.     Marland Kitchen buPROPion (WELLBUTRIN) 75 MG tablet Take 75 mg by mouth every morning. daily    . calcium carbonate (OS-CAL - DOSED IN MG OF ELEMENTAL CALCIUM) 1250 MG tablet Take 1 tablet by mouth daily with breakfast.     . cholecalciferol (VITAMIN D) 1000 UNITS tablet Take 1,000 Units by mouth every morning.     . Cyanocobalamin (VITAMIN B 12 PO) Take 500 mg by mouth every morning.     Marland Kitchen esomeprazole (NEXIUM) 40 MG capsule Take 40 mg by mouth  daily as needed (reflux.).    Marland Kitchen hydrocortisone valerate cream (WESTCORT) 0.2 % Apply 1 application topically 2 (two) times daily as needed (itching).     . MELATONIN PO Take 1 tablet by mouth at bedtime as needed (sleep).    . methenamine (HIPREX) 1 G tablet Take 1 g by mouth 2 (two) times daily.    . metoprolol succinate (TOPROL-XL) 25 MG 24 hr tablet Take 25 mg by mouth daily.    . nitrofurantoin (MACRODANTIN) 100 MG capsule Take 100 mg by mouth every evening.  12  . Probiotic Product (PROBIOTIC DAILY PO) Take 460 mg by mouth every morning.     . sertraline (ZOLOFT) 100 MG tablet Take 100 mg by mouth daily.    Marland Kitchen ibuprofen (ADVIL,MOTRIN) 400 MG tablet Take 1 tablet (400 mg total) by mouth every 6 (six) hours as needed. Take 3 times daily for 3 days, then use every 6 hours as needed. (Patient not taking: Reported on 10/24/2014) 30 tablet 0  . levofloxacin (LEVAQUIN) 500 MG tablet Take 500 mg by mouth daily.    . Vancomycin (VANCOCIN) 750 MG/150ML SOLN Inject 150 mLs (750 mg total) into the vein every 12 (twelve) hours. (Patient not taking: Reported on 11/18/2014) 1500 mL 0  . vancomycin 1,250 mg in sodium chloride 0.9 % 150 mL Inject 1,250 mg  into the vein daily.    . vancomycin 1,500 mg in sodium chloride 0.9 % 250 mL Inject 1,500 mg into the vein daily. Infuse iv over approximately 85 minutes via home pump every 24 hours as directed     No current facility-administered medications for this visit.    Functional Status:   In your present state of health, do you have any difficulty performing the following activities: 11/18/2014 10/24/2014  Hearing? Y N  Vision? N N  Difficulty concentrating or making decisions? [name]  Walking or climbing stairs? Y Y  Dressing or bathing? Y Y  Doing errands, shopping? [name]  Preparing Food and eating ? Y -  Using the Toilet? Y -  In the past six months, have you accidently leaked urine? Y -  Do you have problems with loss of bowel control? N -  Managing your  Medications? N -  Managing your Finances? Y -  Housekeeping or managing your Housekeeping? Y -   Fall Risk  01/23/2015 11/18/2014 09/23/2014 09/18/2014 06/18/2014  Falls in the past year? Yes Yes Yes Yes (No Data)  Number falls in past yr: 2 or more 2 or more 2 or more 2 or more -  Injury with Fall? Yes Yes Yes Yes -  Risk Factor Category  High Fall Risk - High Fall Risk High Fall Risk -  Risk for fall due to : History of fall(s);Impaired balance/gait;Impaired mobility Impaired balance/gait;History of fall(s);Impaired mobility History of fall(s);Impaired balance/gait History of fall(s);Impaired balance/gait;Impaired mobility -  Follow up Falls evaluation completed;Education provided;Falls prevention discussed Falls prevention discussed Education provided;Falls prevention discussed Education provided -   Fall/Depression Screening:    PHQ 2/9 Scores 01/23/2015 11/18/2014 09/23/2014 06/18/2014 06/09/2014  PHQ - 2 Score 0 0 0 0 -  Exception Documentation - - - - Patient refusal    Assessment:   [name], grandaughter and personal care provider present during visit. Patient is alert oriented person place time, date and president and president elect.  Fall Risk [name] continues to be a high fall risk, and per [name] he continues to have falls mostly in the evening, after his personal care worker has left. [name] has made multiple attempts to provide safety, she purchased a new seated walker for the patient, she recently purchased a bed alarm, but reports its sound is very loud and very is alarming and frightens the patient. Most recently [name] has purchased a camera to be installed in his basement apartment, so that she can check on him from her cell phone, as [name] does not notify them if he falls, his has a life alert button that he wears around his neck, his has his cell phone and intercom. Despite all these measures in place patient continues to experience falls when he is alone.  [name] reports that patient has not had a fall with injury, she is able to verbalize that she is aware to call MD or EMS with concern related to serious fall injury. Personal care provider is still able to walk with patient daily with his rolling walker or either helps him with chair exercises.  Frequent Urinary tract infections: Mr.Ciliberto continues to have a foley catheter in place, connected to a leg bag, home personal care provider reports that she cleans foley site daily with soap and water and rinses well with water, urine cloudy colored. Patient has an appointment with Urologist on tomorrow and she believes it is time that the foley will be changed.[name] is able to  state and recognize the signs and symptoms of UTI and the importance of notifying the MD.  It is Lindsey's and [name]'s desire that he is remain in the home so she continues to make adjustments for his safety .Discussed with [name] progress that patient has made.   Plan:  Caregiver will continue to notify MD of signs and symptoms of UTI , new symptoms. Caregiver will notify MD,or  911  related to falls with injury RN CM will continue to explore options to assist patient at home, in fall prevention RN CM will plan home  visit in January.  Joylene Draft, RN, [place] Management [phone]- Mobile [phone]- Toll Free Main Office  [facility] CM Care Plan Problem One        Most Recent Value   Care Plan Problem One  Frequent hospital Admissions related to Urinary Tract Infections   Role Documenting the Problem One  Care Management Coordinator   Care Plan for Problem One  Active   THN Long Term Goal (31-90 days)  Patient will not experience hospitalization related to UTI in the next 31 days   THN Long Term Goal Start Date  10/28/14 [not met goal date restarted]   [facility] Long Term Goal Met Date  12/18/14   Interventions for Problem One Long Term Goal  -- [no new signs of UTI ]   THN CM Short Term Goal #1  (0-30 days)  Caregivers will be able to state  2 early signs and symptoms of Urinary tract infection and actions to take   [facility] CM Short Term Goal #1 Start Date  09/23/14   [facility] CM Short Term Goal #1 Met Date  10/07/14   THN CM Short Term Goal #2 (0-30 days)  Caregivers will be able to report Urinary tract prevention measures   THN CM Short Term Goal #2 Start Date  09/23/14   [facility] CM Short Term Goal #2 Met Date  11/18/14   THN CM Short Term Goal #3 (0-30 days)  Caregiver will report daily care to catheter site   [facility] CM Short Term Goal #3 Start Date  12/18/14   [facility] CM Short Term Goal #3 Met Date  01/08/15    [facility] CM Care Plan Problem Two        Most Recent Value   Care Plan Problem Two  Frequent Falls   Role Documenting the Problem Two  Care Management Coordinator   Care Plan for Problem Two  Active   Interventions for Problem Two Long Term Goal   caregiver has purchased a camera for patients home so that she can monitor patient from her  cell phone   THN Long Term Goal (31-90) days  Patient will be able to report no falls with injury for next 31 days   THN Long Term Goal Start Date  12/18/14   THN CM Short Term Goal #1 (0-30 days)  Patient will utiilize walker during ambulation at all  times   [facility] CM Short Term Goal #1 Start Date  09/18/14   [facility] CM Short Term Goal #1 Met Date   10/07/14   THN CM Short Term Goal #2 (0-30 days)  Patient will be able to report a walking routine in home at least 3 days per week for the next 30 days    THN CM Short Term Goal #2 Start Date  09/23/14   [facility] CM Short Term Goal #2 Met Date  10/07/14 Linus Mako with Personal care assistant daily]  Joylene Draft, RN, [place] Management [phone]- Mobile [phone]- Toll Free Main Office

## 2015-01-23 DIAGNOSIS — N1330 Unspecified hydronephrosis: Secondary | ICD-10-CM | POA: Diagnosis not present

## 2015-01-23 DIAGNOSIS — Z8551 Personal history of malignant neoplasm of bladder: Secondary | ICD-10-CM | POA: Diagnosis not present

## 2015-01-23 DIAGNOSIS — B9620 Unspecified Escherichia coli [E. coli] as the cause of diseases classified elsewhere: Secondary | ICD-10-CM | POA: Diagnosis not present

## 2015-01-23 DIAGNOSIS — N390 Urinary tract infection, site not specified: Secondary | ICD-10-CM | POA: Diagnosis not present

## 2015-03-03 ENCOUNTER — Encounter: Payer: Self-pay | Admitting: *Deleted

## 2015-03-04 DIAGNOSIS — Z8551 Personal history of malignant neoplasm of bladder: Secondary | ICD-10-CM | POA: Diagnosis not present

## 2015-03-19 ENCOUNTER — Ambulatory Visit: Payer: Self-pay | Admitting: *Deleted

## 2015-03-20 ENCOUNTER — Other Ambulatory Visit: Payer: Self-pay | Admitting: *Deleted

## 2015-03-20 NOTE — Patient Outreach (Signed)
[facility]) Care Management  03/20/2015  COLBY REELS [birth year] [id number]   Telephone call to [name], granddaughter [facility]CP[facility] of [place], to reschedule planned Discharge  home visit for 1/26, [name] discussed that the patient is doing about the same, he continues to falls without, serious injury.  [name] reported that she has scheduled a appointment with PCP, Dr.Shaw and patient continues to follow up with urologist on a regular basis.  Mr.Godbolt still has home[employer] personal care service for 7 hours a day.  [name] also discussed that she is also, caring for another family member. Agreed upon telephone visit for case closure on February  2 at [place], RN, [place] Management [phone]- Mobile [phone]- [place] .

## 2015-03-26 ENCOUNTER — Other Ambulatory Visit: Payer: Self-pay | Admitting: *Deleted

## 2015-03-26 NOTE — Patient Outreach (Signed)
[facility]) Care Management  03/26/2015  [name] [birth year] [id number]  Spoke with [name], Granddaughter and caregiver, she reports that patient is doing about the same, he continues to experience falls even with using his walker, no injury [name] still has personal care assistance approximately 8 hours a day, no falls during this time, patient uses a rolling walker. [name] has put in place multiple fall prevention strategies, Patient keeps cell phone near him He has a bell to ring near him Wears life alert button She has tried a bed/chair alarm system She has installed a video camera to be have to keep a closer eye on him while he is in the basement apartment at her home. His personal care assistance assist with exercises provided by Physical therapy sessions in the past, or walking in room as tolerated.  [name] still has a foley catheter,in place that he goes to urology office monthly for catheter change out, noted somewhat decrease in UTI, since patient is not having to self cath.  Assessment: Despite multiple fall prevention measures due to his condition  patient continues to experience falls.I have discussed with [name] it is patient's desire to remain at home, we have discussed increasing personal care services to cover during the night also.    Discussed with [name] case closure option, as resources have been explored, she is in agreement and offered thanks for the support,,verified that she has [facility] contact information in case of need in the future.   Plan Will close case,and alert [name], [name] and patient will receive a letter.   Dennis Draft, RN, [place] Management [phone]- Mobile [phone]- Toll Free Main Office

## 2015-03-27 ENCOUNTER — Encounter: Payer: Self-pay | Admitting: *Deleted

## 2015-03-27 DIAGNOSIS — R296 Repeated falls: Secondary | ICD-10-CM | POA: Diagnosis not present

## 2015-03-27 DIAGNOSIS — M810 Age-related osteoporosis without current pathological fracture: Secondary | ICD-10-CM | POA: Diagnosis not present

## 2015-03-27 DIAGNOSIS — E784 Other hyperlipidemia: Secondary | ICD-10-CM | POA: Diagnosis not present

## 2015-03-27 DIAGNOSIS — F0390 Unspecified dementia without behavioral disturbance: Secondary | ICD-10-CM | POA: Diagnosis not present

## 2015-03-27 DIAGNOSIS — N390 Urinary tract infection, site not specified: Secondary | ICD-10-CM | POA: Diagnosis not present

## 2015-03-27 DIAGNOSIS — R7301 Impaired fasting glucose: Secondary | ICD-10-CM | POA: Diagnosis not present

## 2015-03-27 DIAGNOSIS — N183 Chronic kidney disease, stage 3 (moderate): Secondary | ICD-10-CM | POA: Diagnosis not present

## 2015-03-27 DIAGNOSIS — I480 Paroxysmal atrial fibrillation: Secondary | ICD-10-CM | POA: Diagnosis not present

## 2015-04-08 DIAGNOSIS — N1330 Unspecified hydronephrosis: Secondary | ICD-10-CM | POA: Diagnosis not present

## 2015-04-08 DIAGNOSIS — Q631 Lobulated, fused and horseshoe kidney: Secondary | ICD-10-CM | POA: Diagnosis not present

## 2015-04-08 DIAGNOSIS — N390 Urinary tract infection, site not specified: Secondary | ICD-10-CM | POA: Diagnosis not present

## 2015-04-08 DIAGNOSIS — N261 Atrophy of kidney (terminal): Secondary | ICD-10-CM | POA: Diagnosis not present

## 2015-04-24 DIAGNOSIS — M810 Age-related osteoporosis without current pathological fracture: Secondary | ICD-10-CM | POA: Diagnosis not present

## 2015-05-06 DIAGNOSIS — N390 Urinary tract infection, site not specified: Secondary | ICD-10-CM | POA: Diagnosis not present

## 2015-05-06 DIAGNOSIS — N1330 Unspecified hydronephrosis: Secondary | ICD-10-CM | POA: Diagnosis not present

## 2015-05-11 ENCOUNTER — Encounter (HOSPITAL_COMMUNITY): Payer: Self-pay

## 2015-05-11 ENCOUNTER — Ambulatory Visit (HOSPITAL_COMMUNITY)
Admission: RE | Admit: 2015-05-11 | Discharge: 2015-05-11 | Disposition: A | Discharge status: Home / Self Care | Payer: Medicare Other | Source: Ambulatory Visit | Attending: Internal Medicine | Admitting: Internal Medicine

## 2015-05-11 DIAGNOSIS — M810 Age-related osteoporosis without current pathological fracture: Secondary | ICD-10-CM | POA: Insufficient documentation

## 2015-05-11 MED ORDER — SODIUM CHLORIDE 0.9 % IV SOLN
Freq: Once | INTRAVENOUS | Status: AC
  Administered 2015-05-11: 13:00:00 via INTRAVENOUS

## 2015-05-11 MED ORDER — ZOLEDRONIC ACID 5 MG/100ML IV SOLN
5.0000 mg | Freq: Once | INTRAVENOUS | Status: AC
  Administered 2015-05-11: 5 mg via INTRAVENOUS
  Filled 2015-05-11: qty 100

## 2015-05-11 NOTE — Discharge Instructions (Signed)
RECLAST Zoledronic Acid injection (Paget's Disease, Osteoporosis) What is this medicine? ZOLEDRONIC ACID (ZOE le dron ik AS id) lowers the amount of calcium loss from bone. It is used to treat Paget's disease and osteoporosis in women. This medicine may be used for other purposes; ask your health care provider or pharmacist if you have questions. What should I tell my health care provider before I take this medicine? They need to know if you have any of these conditions: -aspirin-sensitive asthma -cancer, especially if you are receiving medicines used to treat cancer -dental disease or wear dentures -infection -kidney disease -low levels of calcium in the blood -past surgery on the parathyroid gland or intestines -receiving corticosteroids like dexamethasone or prednisone -an unusual or allergic reaction to zoledronic acid, other medicines, foods, dyes, or preservatives -pregnant or trying to get pregnant -breast-feeding How should I use this medicine? This medicine is for infusion into a vein. It is given by a health care professional in a hospital or clinic setting. Talk to your pediatrician regarding the use of this medicine in children. This medicine is not approved for use in children. Overdosage: If you think you have taken too much of this medicine contact a poison control center or emergency room at once. NOTE: This medicine is only for you. Do not share this medicine with others. What if I miss a dose? It is important not to miss your dose. Call your doctor or health care professional if you are unable to keep an appointment. What may interact with this medicine? -certain antibiotics given by injection -NSAIDs, medicines for pain and inflammation, like ibuprofen or naproxen -some diuretics like bumetanide, furosemide -teriparatide This list may not describe all possible interactions. Give your health care provider a list of all the medicines, herbs, non-prescription drugs, or  dietary supplements you use. Also tell them if you smoke, drink alcohol, or use illegal drugs. Some items may interact with your medicine. What should I watch for while using this medicine? Visit your doctor or health care professional for regular checkups. It may be some time before you see the benefit from this medicine. Do not stop taking your medicine unless your doctor tells you to. Your doctor may order blood tests or other tests to see how you are doing. Women should inform their doctor if they wish to become pregnant or think they might be pregnant. There is a potential for serious side effects to an unborn child. Talk to your health care professional or pharmacist for more information. You should make sure that you get enough calcium and vitamin D while you are taking this medicine. Discuss the foods you eat and the vitamins you take with your health care professional. Some people who take this medicine have severe bone, joint, and/or muscle pain. This medicine may also increase your risk for jaw problems or a broken thigh bone. Tell your doctor right away if you have severe pain in your jaw, bones, joints, or muscles. Tell your doctor if you have any pain that does not go away or that gets worse. Tell your dentist and dental surgeon that you are taking this medicine. You should not have major dental surgery while on this medicine. See your dentist to have a dental exam and fix any dental problems before starting this medicine. Take good care of your teeth while on this medicine. Make sure you see your dentist for regular follow-up appointments. What side effects may I notice from receiving this medicine? Side effects that you  should report to your doctor or health care professional as soon as possible: -allergic reactions like skin rash, itching or hives, swelling of the face, lips, or tongue -anxiety, confusion, or depression -breathing problems -changes in vision -eye pain -feeling faint or  lightheaded, falls -jaw pain, especially after dental work -mouth sores -muscle cramps, stiffness, or weakness -redness, blistering, peeling or loosening of the skin, including inside the mouth -trouble passing urine or change in the amount of urine Side effects that usually do not require medical attention (report to your doctor or health care professional if they continue or are bothersome): -bone, joint, or muscle pain -constipation -diarrhea -fever -hair loss -irritation at site where injected -loss of appetite -nausea, vomiting -stomach upset -trouble sleeping -trouble swallowing -weak or tired This list may not describe all possible side effects. Call your doctor for medical advice about side effects. You may report side effects to FDA at 1-800-FDA-1088. Where should I keep my medicine? This drug is given in a hospital or clinic and will not be stored at home. NOTE: This sheet is a summary. It may not cover all possible information. If you have questions about this medicine, talk to your doctor, pharmacist, or health care provider.    2016, Elsevier/Gold Standard. (2013-07-06 14:19:57) Osteoporosis Osteoporosis is the thinning and loss of density in the bones. Osteoporosis makes the bones more brittle, fragile, and likely to break (fracture). Over time, osteoporosis can cause the bones to become so weak that they fracture after a simple fall. The bones most likely to fracture are the bones in the hip, wrist, and spine. CAUSES  The exact cause is not known. RISK FACTORS Anyone can develop osteoporosis. You may be at greater risk if you have a family history of the condition or have poor nutrition. You may also have a higher risk if you are:   Male.   21 years old or older.  A smoker.  Not physically active.   White or Asian.  Slender. SIGNS AND SYMPTOMS  A fracture might be the first sign of the disease, especially if it results from a fall or injury that would  not usually cause a bone to break. Other signs and symptoms include:   Low back and neck pain.  Stooped posture.  Height loss. DIAGNOSIS  To make a diagnosis, your health care provider may:  Take a medical history.  Perform a physical exam.  Order tests, such as:  A bone mineral density test.  A dual-energy X-ray absorptiometry test. TREATMENT  The goal of osteoporosis treatment is to strengthen your bones to reduce your risk of a fracture. Treatment may involve:  Making lifestyle changes, such as:  Eating a diet rich in calcium.  Doing weight-bearing and muscle-strengthening exercises.  Stopping tobacco use.  Limiting alcohol intake.  Taking medicine to slow the process of bone loss or to increase bone density.  Monitoring your levels of calcium and vitamin D. HOME CARE INSTRUCTIONS  Include calcium and vitamin D in your diet. Calcium is important for bone health, and vitamin D helps the body absorb calcium.  Perform weight-bearing and muscle-strengthening exercises as directed by your health care provider.  Do not use any tobacco products, including cigarettes, chewing tobacco, and electronic cigarettes. If you need help quitting, ask your health care provider.  Limit your alcohol intake.  Take medicines only as directed by your health care provider.  Keep all follow-up visits as directed by your health care provider. This is important.  Take  precautions at home to lower your risk of falling, such as:  Keeping rooms well lit and clutter free.  Installing safety rails on stairs.  Using rubber mats in the bathroom and other areas that are often wet or slippery. SEEK IMMEDIATE MEDICAL CARE IF:  You fall or injure yourself.    This information is not intended to replace advice given to you by your health care provider. Make sure you discuss any questions you have with your health care provider.   Document Released: 11/17/2004 Document Revised: 02/28/2014  Document Reviewed: 07/18/2013 Elsevier Interactive Patient Education [employer].

## 2015-05-30 ENCOUNTER — Encounter (HOSPITAL_COMMUNITY): Payer: Self-pay

## 2015-05-30 ENCOUNTER — Inpatient Hospital Stay (HOSPITAL_COMMUNITY)
Admission: EM | Admit: 2015-05-30 | Discharge: 2015-06-03 | DRG: 698 | Disposition: A | Discharge status: Skilled Nursing Facility | Payer: Medicare Other | Attending: Internal Medicine | Admitting: Internal Medicine

## 2015-05-30 ENCOUNTER — Emergency Department (HOSPITAL_COMMUNITY): Payer: Medicare Other

## 2015-05-30 DIAGNOSIS — G9340 Encephalopathy, unspecified: Secondary | ICD-10-CM | POA: Diagnosis present

## 2015-05-30 DIAGNOSIS — I4891 Unspecified atrial fibrillation: Secondary | ICD-10-CM | POA: Diagnosis present

## 2015-05-30 DIAGNOSIS — Y846 Urinary catheterization as the cause of abnormal reaction of the patient, or of later complication, without mention of misadventure at the time of the procedure: Secondary | ICD-10-CM | POA: Diagnosis present

## 2015-05-30 DIAGNOSIS — E785 Hyperlipidemia, unspecified: Secondary | ICD-10-CM | POA: Diagnosis present

## 2015-05-30 DIAGNOSIS — R0602 Shortness of breath: Secondary | ICD-10-CM

## 2015-05-30 DIAGNOSIS — R278 Other lack of coordination: Secondary | ICD-10-CM | POA: Diagnosis not present

## 2015-05-30 DIAGNOSIS — Z936 Other artificial openings of urinary tract status: Secondary | ICD-10-CM | POA: Diagnosis not present

## 2015-05-30 DIAGNOSIS — I482 Chronic atrial fibrillation: Secondary | ICD-10-CM | POA: Diagnosis not present

## 2015-05-30 DIAGNOSIS — Z9842 Cataract extraction status, left eye: Secondary | ICD-10-CM | POA: Diagnosis not present

## 2015-05-30 DIAGNOSIS — N390 Urinary tract infection, site not specified: Secondary | ICD-10-CM | POA: Diagnosis present

## 2015-05-30 DIAGNOSIS — G9341 Metabolic encephalopathy: Secondary | ICD-10-CM | POA: Diagnosis present

## 2015-05-30 DIAGNOSIS — Z85118 Personal history of other malignant neoplasm of bronchus and lung: Secondary | ICD-10-CM | POA: Diagnosis not present

## 2015-05-30 DIAGNOSIS — Z87440 Personal history of urinary (tract) infections: Secondary | ICD-10-CM | POA: Diagnosis not present

## 2015-05-30 DIAGNOSIS — Z7982 Long term (current) use of aspirin: Secondary | ICD-10-CM

## 2015-05-30 DIAGNOSIS — M6281 Muscle weakness (generalized): Secondary | ICD-10-CM | POA: Diagnosis not present

## 2015-05-30 DIAGNOSIS — Z9841 Cataract extraction status, right eye: Secondary | ICD-10-CM | POA: Diagnosis not present

## 2015-05-30 DIAGNOSIS — Z79899 Other long term (current) drug therapy: Secondary | ICD-10-CM | POA: Diagnosis not present

## 2015-05-30 DIAGNOSIS — R443 Hallucinations, unspecified: Secondary | ICD-10-CM | POA: Diagnosis present

## 2015-05-30 DIAGNOSIS — R404 Transient alteration of awareness: Secondary | ICD-10-CM | POA: Diagnosis not present

## 2015-05-30 DIAGNOSIS — L8990 Pressure ulcer of unspecified site, unspecified stage: Secondary | ICD-10-CM | POA: Diagnosis present

## 2015-05-30 DIAGNOSIS — R402411 Glasgow coma scale score 13-15, in the field [EMT or ambulance]: Secondary | ICD-10-CM | POA: Diagnosis not present

## 2015-05-30 DIAGNOSIS — B965 Pseudomonas (aeruginosa) (mallei) (pseudomallei) as the cause of diseases classified elsewhere: Secondary | ICD-10-CM | POA: Diagnosis present

## 2015-05-30 DIAGNOSIS — F0390 Unspecified dementia without behavioral disturbance: Secondary | ICD-10-CM | POA: Diagnosis present

## 2015-05-30 DIAGNOSIS — T83511A Infection and inflammatory reaction due to indwelling urethral catheter, initial encounter: Secondary | ICD-10-CM | POA: Diagnosis not present

## 2015-05-30 DIAGNOSIS — Z87891 Personal history of nicotine dependence: Secondary | ICD-10-CM

## 2015-05-30 DIAGNOSIS — F32A Depression, unspecified: Secondary | ICD-10-CM | POA: Diagnosis present

## 2015-05-30 DIAGNOSIS — Z8673 Personal history of transient ischemic attack (TIA), and cerebral infarction without residual deficits: Secondary | ICD-10-CM | POA: Diagnosis not present

## 2015-05-30 DIAGNOSIS — R531 Weakness: Secondary | ICD-10-CM | POA: Diagnosis not present

## 2015-05-30 DIAGNOSIS — I481 Persistent atrial fibrillation: Secondary | ICD-10-CM | POA: Diagnosis not present

## 2015-05-30 DIAGNOSIS — I10 Essential (primary) hypertension: Secondary | ICD-10-CM | POA: Diagnosis present

## 2015-05-30 DIAGNOSIS — R2681 Unsteadiness on feet: Secondary | ICD-10-CM | POA: Diagnosis not present

## 2015-05-30 DIAGNOSIS — F329 Major depressive disorder, single episode, unspecified: Secondary | ICD-10-CM | POA: Diagnosis not present

## 2015-05-30 DIAGNOSIS — Z8551 Personal history of malignant neoplasm of bladder: Secondary | ICD-10-CM | POA: Diagnosis not present

## 2015-05-30 LAB — BASIC METABOLIC PANEL
Anion gap: 10 (ref 5–15)
BUN: 19 mg/dL (ref 6–20)
CALCIUM: 9.8 mg/dL (ref 8.9–10.3)
CO2: 25 mmol/L (ref 22–32)
CREATININE: 1.15 mg/dL (ref 0.61–1.24)
Chloride: 107 mmol/L (ref 101–111)
GFR calc non Af Amer: 57 mL/min — ABNORMAL LOW (ref >60)
GLUCOSE: 116 mg/dL — AB (ref 65–99)
Potassium: 4.2 mmol/L (ref 3.5–5.1)
Sodium: 142 mmol/L (ref 135–145)

## 2015-05-30 LAB — URINALYSIS, ROUTINE W REFLEX MICROSCOPIC
BILIRUBIN URINE: NEGATIVE
GLUCOSE, UA: NEGATIVE mg/dL
KETONES UR: NEGATIVE mg/dL
NITRITE: POSITIVE — AB
PH: 6 (ref 5.0–8.0)
Protein, ur: 30 mg/dL — AB
SPECIFIC GRAVITY, URINE: 1.017 (ref 1.005–1.030)

## 2015-05-30 LAB — CBC WITH DIFFERENTIAL/PLATELET
BASOS ABS: 0 10*3/uL (ref 0.0–0.1)
BASOS PCT: 0 %
Eosinophils Absolute: 0.1 10*3/uL (ref 0.0–0.7)
Eosinophils Relative: 1 %
HEMATOCRIT: 47.6 % (ref 39.0–52.0)
Hemoglobin: 15.5 g/dL (ref 13.0–17.0)
LYMPHS ABS: 1.5 10*3/uL (ref 0.7–4.0)
Lymphocytes Relative: 10 %
MCH: 29.7 pg (ref 26.0–34.0)
MCHC: 32.6 g/dL (ref 30.0–36.0)
MCV: 91.2 fL (ref 78.0–100.0)
MONOS PCT: 8 %
Monocytes Absolute: 1.2 10*3/uL — ABNORMAL HIGH (ref 0.1–1.0)
NEUTROS ABS: 12 10*3/uL — AB (ref 1.7–7.7)
Neutrophils Relative %: 81 %
Platelets: 207 10*3/uL (ref 150–400)
RBC: 5.22 MIL/uL (ref 4.22–5.81)
RDW: 14.3 % (ref 11.5–15.5)
WBC: 14.8 10*3/uL — ABNORMAL HIGH (ref 4.0–10.5)

## 2015-05-30 LAB — URINE MICROSCOPIC-ADD ON: Squamous Epithelial / LPF: NONE SEEN

## 2015-05-30 LAB — I-STAT CG4 LACTIC ACID, ED: Lactic Acid, Venous: 1.07 mmol/L (ref 0.5–2.0)

## 2015-05-30 MED ORDER — METOPROLOL SUCCINATE ER 25 MG PO TB24
25.0000 mg | ORAL_TABLET | Freq: Every day | ORAL | Status: DC
  Administered 2015-05-31 – 2015-06-03 (×4): 25 mg via ORAL
  Filled 2015-05-30 (×4): qty 1

## 2015-05-30 MED ORDER — BUPROPION HCL 75 MG PO TABS
75.0000 mg | ORAL_TABLET | Freq: Every morning | ORAL | Status: DC
  Administered 2015-05-31 – 2015-06-03 (×4): 75 mg via ORAL
  Filled 2015-05-30 (×4): qty 1

## 2015-05-30 MED ORDER — SODIUM CHLORIDE 0.9 % IV SOLN
INTRAVENOUS | Status: DC
  Administered 2015-05-31 – 2015-06-03 (×8): via INTRAVENOUS

## 2015-05-30 MED ORDER — SERTRALINE HCL 100 MG PO TABS
100.0000 mg | ORAL_TABLET | Freq: Every day | ORAL | Status: DC
  Administered 2015-05-31 – 2015-06-03 (×4): 100 mg via ORAL
  Filled 2015-05-30 (×4): qty 1

## 2015-05-30 MED ORDER — ACETAMINOPHEN 650 MG RE SUPP: 650.0000 mg | Freq: Four times a day (QID) | RECTAL | Status: DC | PRN

## 2015-05-30 MED ORDER — ATORVASTATIN CALCIUM 10 MG PO TABS
10.0000 mg | ORAL_TABLET | Freq: Every day | ORAL | Status: DC
  Administered 2015-05-31 – 2015-06-02 (×4): 10 mg via ORAL
  Filled 2015-05-30 (×4): qty 1

## 2015-05-30 MED ORDER — VITAMIN C 500 MG PO TABS
1000.0000 mg | ORAL_TABLET | Freq: Every morning | ORAL | Status: DC
  Administered 2015-05-31 – 2015-06-03 (×4): 1000 mg via ORAL
  Filled 2015-05-30 (×4): qty 2

## 2015-05-30 MED ORDER — CALCIUM CARBONATE 1250 (500 CA) MG PO TABS
1.0000 | ORAL_TABLET | Freq: Every day | ORAL | Status: DC
  Administered 2015-05-31 – 2015-06-03 (×4): 500 mg via ORAL
  Filled 2015-05-30 (×5): qty 1

## 2015-05-30 MED ORDER — ENOXAPARIN SODIUM 40 MG/0.4ML ~~LOC~~ SOLN
40.0000 mg | Freq: Every day | SUBCUTANEOUS | Status: DC
  Administered 2015-05-31 – 2015-06-02 (×4): 40 mg via SUBCUTANEOUS
  Filled 2015-05-30 (×4): qty 0.4

## 2015-05-30 MED ORDER — SODIUM CHLORIDE 0.9 % IV BOLUS (SEPSIS)
1000.0000 mL | Freq: Once | INTRAVENOUS | Status: AC
  Administered 2015-05-30: 1000 mL via INTRAVENOUS

## 2015-05-30 MED ORDER — HYDROCORTISONE VALERATE 0.2 % EX CREA
1.0000 "application " | TOPICAL_CREAM | Freq: Two times a day (BID) | CUTANEOUS | Status: DC | PRN
  Filled 2015-05-30: qty 15

## 2015-05-30 MED ORDER — ASPIRIN 81 MG PO CHEW
81.0000 mg | CHEWABLE_TABLET | Freq: Every morning | ORAL | Status: DC
  Administered 2015-05-31 – 2015-06-03 (×4): 81 mg via ORAL
  Filled 2015-05-30 (×4): qty 1

## 2015-05-30 MED ORDER — ALPHA LIPOIC ACID 200 MG PO CAPS: 200.0000 mg | ORAL_CAPSULE | Freq: Every morning | ORAL | Status: DC

## 2015-05-30 MED ORDER — MELATONIN 3 MG PO TABS: 1.0000 | ORAL_TABLET | Freq: Every evening | ORAL | Status: DC | PRN

## 2015-05-30 MED ORDER — DEXTROSE 5 % IV SOLN
1.0000 g | INTRAVENOUS | Status: DC
  Administered 2015-05-30 – 2015-05-31 (×2): 1 g via INTRAVENOUS
  Filled 2015-05-30 (×2): qty 10

## 2015-05-30 MED ORDER — METHENAMINE HIPPURATE 1 G PO TABS
1.0000 g | ORAL_TABLET | Freq: Two times a day (BID) | ORAL | Status: DC
  Filled 2015-05-30: qty 1

## 2015-05-30 MED ORDER — VITAMIN D 1000 UNITS PO TABS
1000.0000 [IU] | ORAL_TABLET | Freq: Every morning | ORAL | Status: DC
  Administered 2015-05-31 – 2015-06-03 (×4): 1000 [IU] via ORAL
  Filled 2015-05-30 (×4): qty 1

## 2015-05-30 MED ORDER — METHENAMINE MANDELATE 1 G PO TABS
1000.0000 mg | ORAL_TABLET | Freq: Two times a day (BID) | ORAL | Status: DC
  Administered 2015-05-31 – 2015-06-03 (×8): 1000 mg via ORAL
  Filled 2015-05-30 (×9): qty 1

## 2015-05-30 MED ORDER — VANCOMYCIN HCL 10 G IV SOLR
1500.0000 mg | INTRAVENOUS | Status: AC
  Administered 2015-05-30: 1500 mg via INTRAVENOUS
  Filled 2015-05-30: qty 1500

## 2015-05-30 MED ORDER — ACETAMINOPHEN 325 MG PO TABS: 650.0000 mg | ORAL_TABLET | Freq: Four times a day (QID) | ORAL | Status: DC | PRN

## 2015-05-30 MED ORDER — VANCOMYCIN HCL 10 G IV SOLR
1500.0000 mg | INTRAVENOUS | Status: DC
  Administered 2015-05-31: 1500 mg via INTRAVENOUS
  Filled 2015-05-30 (×2): qty 1500

## 2015-05-30 MED ORDER — SODIUM CHLORIDE 0.9% FLUSH
3.0000 mL | Freq: Two times a day (BID) | INTRAVENOUS | Status: DC
  Administered 2015-06-03: 3 mL via INTRAVENOUS

## 2015-05-30 MED ORDER — ONDANSETRON HCL 4 MG/2ML IJ SOLN: 4.0000 mg | Freq: Three times a day (TID) | INTRAMUSCULAR | Status: DC | PRN

## 2015-05-30 MED ORDER — CYANOCOBALAMIN 500 MCG PO TABS
500.0000 ug | ORAL_TABLET | Freq: Every morning | ORAL | Status: DC
  Administered 2015-05-31 – 2015-06-03 (×4): 500 ug via ORAL
  Filled 2015-05-30 (×4): qty 1

## 2015-05-30 MED ORDER — SODIUM CHLORIDE 0.9 % IV SOLN
INTRAVENOUS | Status: DC
  Administered 2015-05-30: 10 mL/h via INTRAVENOUS

## 2015-05-30 MED ORDER — RISAQUAD PO CAPS
1.0000 | ORAL_CAPSULE | Freq: Every morning | ORAL | Status: DC
  Administered 2015-05-31 – 2015-06-03 (×4): 1 via ORAL
  Filled 2015-05-30 (×4): qty 1

## 2015-05-30 NOTE — Progress Notes (Signed)
PHARMACIST - PHYSICIAN ORDER COMMUNICATION  CONCERNING: P&T Medication Policy on Herbal Medications  DESCRIPTION:  This patient's order for:  Melatonin & Alpha-Lipoic Acid  has been noted.  This product(s) is classified as an "herbal" or natural product. Due to a lack of definitive safety studies or FDA approval, nonstandard manufacturing practices, plus the potential risk of unknown drug-drug interactions while on inpatient medications, the Pharmacy and Therapeutics Committee does not permit the use of "herbal" or natural products of this type within [facility].   ACTION TAKEN: The pharmacy department is unable to verify this order at this time and your patient has been informed of this safety policy. Please reevaluate patient's clinical condition at discharge and address if the herbal or natural product(s) should be resumed at that time.  Leone Haven, PharmD

## 2015-05-30 NOTE — ED Provider Notes (Signed)
CSN: [id number]     Arrival date & time 05/30/15  1805 History   First MD Initiated Contact with Patient 05/30/15 1812     Chief Complaint  Patient presents with  . Weakness  . Hallucinations     (Consider location/radiation/quality/duration/timing/severity/associated sxs/prior Treatment) HPI Comments: Patient here with increased weakness 24 hours with associated confusion and hallucinations. History of similar symptoms associated with urinary tract infections. No reported fever, vomiting, diarrhea. No reported history of head injury. Does have indwelling Foley catheter but is not taking any chronic antibiotics. There has been a change to the color of the urine in the catheter. No treatment use prior to arrival. Nothing makes her symptoms better worse.  Patient is a 82 y.o. male presenting with weakness. The history is provided by the patient and a relative.  Weakness    Past Medical History  Diagnosis Date  . Atrial fibrillation ([place])   . Hyperlipidemia   . Hypertension   . UTI (urinary tract infection)   . Dementia   . Bladder cancer ([facility])     hx of surgery and chemo  . Lung cancer [facility])     Status post surgery resection  . Diverticulitis   . Depression 08/19/2014   Past Surgical History  Procedure Laterality Date  . Hernia repair  2006  . Cataract extraction, bilateral  1998, 2000  . Cystectomy  2002  . Lung removal, partial     Family History  Problem Relation Age of Onset  . Cancer Maternal Aunt     unknown  . Cancer Maternal Grandmother     unknown  . Aneurysm Brother   . Stroke Father     cerebral hemorrhage   Social History  Substance Use Topics  . Smoking status: Former Smoker    Types: Cigarettes    Quit date: 10/10/1991  . Smokeless tobacco: Never Used  . Alcohol Use: No    Review of Systems  Neurological: Positive for weakness.  All other systems reviewed and are negative.     Allergies  Sulfa antibiotics and Sulfonamide derivatives  Home  Medications   Prior to Admission medications   Medication Sig Start Date End Date Taking? Authorizing Provider  ALPHA LIPOIC ACID PO Take 200-300 mg by mouth every morning.     Historical Provider, MD  ascorbic acid (VITAMIN C) 1000 MG tablet Take 1,000 mg by mouth every morning.     Historical Provider, MD  aspirin 81 MG chewable tablet Chew 81 mg by mouth every morning.     Historical Provider, MD  atorvastatin (LIPITOR) 10 MG tablet Take 10 mg by mouth at bedtime.     Historical Provider, MD  buPROPion (WELLBUTRIN) 75 MG tablet Take 75 mg by mouth every morning. daily    Historical Provider, MD  calcium carbonate (OS-CAL - DOSED IN MG OF ELEMENTAL CALCIUM) 1250 MG tablet Take 1 tablet by mouth daily with breakfast.     Historical Provider, MD  cholecalciferol (VITAMIN D) 1000 UNITS tablet Take 1,000 Units by mouth every morning.     Historical Provider, MD  Cyanocobalamin (VITAMIN B 12 PO) Take 500 mg by mouth every morning.     Historical Provider, MD  esomeprazole (NEXIUM) 40 MG capsule Take 40 mg by mouth daily as needed (reflux.).    Historical Provider, MD  hydrocortisone valerate cream (WESTCORT) 0.2 % Apply 1 application topically 2 (two) times daily as needed (itching).     Historical Provider, MD  ibuprofen (ADVIL,MOTRIN) 400 MG  tablet Take 1 tablet (400 mg total) by mouth every 6 (six) hours as needed. Take 3 times daily for 3 days, then use every 6 hours as needed. 08/22/14   Eugenie Filler, MD  levofloxacin (LEVAQUIN) 500 MG tablet Take 500 mg by mouth daily.    Historical Provider, MD  MELATONIN PO Take 1 tablet by mouth at bedtime as needed (sleep).    Historical Provider, MD  methenamine (HIPREX) 1 G tablet Take 1 g by mouth 2 (two) times daily.    Historical Provider, MD  metoprolol succinate (TOPROL-XL) 25 MG 24 hr tablet Take 25 mg by mouth daily.    Historical Provider, MD  nitrofurantoin (MACRODANTIN) 100 MG capsule Take 100 mg by mouth every evening. 10/17/14   Historical  Provider, MD  Probiotic Product (PROBIOTIC DAILY PO) Take 460 mg by mouth every morning.     Historical Provider, MD  sertraline (ZOLOFT) 100 MG tablet Take 100 mg by mouth daily.    Historical Provider, MD  Vancomycin (VANCOCIN) 750 MG/150ML SOLN Inject 150 mLs (750 mg total) into the vein every 12 (twelve) hours. Patient not taking: Reported on 11/18/2014 10/27/14   Belkys A Regalado, MD  vancomycin 1,250 mg in sodium chloride 0.9 % 150 mL Inject 1,250 mg into the vein daily.    Historical Provider, MD  vancomycin 1,500 mg in sodium chloride 0.9 % 250 mL Inject 1,500 mg into the vein daily. Infuse iv over approximately 85 minutes via home pump every 24 hours as directed    Historical Provider, MD   BP 140/82 mmHg  Pulse 64  Temp(Src) 98.7 F (37.1 C) (Oral)  Resp 16  SpO2 93% Physical Exam  Constitutional: He is oriented to person, place, and time. He appears well-developed and well-nourished.  Non-toxic appearance. No distress.  HENT:  Head: Normocephalic and atraumatic.  Eyes: Conjunctivae, EOM and lids are normal. Pupils are equal, round, and reactive to light.  Neck: Normal range of motion. Neck supple. No tracheal deviation present. No thyroid mass present.  Cardiovascular: Normal rate, regular rhythm and normal heart sounds.  Exam reveals no gallop.   No murmur heard. Pulmonary/Chest: Effort normal and breath sounds normal. No stridor. No respiratory distress. He has no decreased breath sounds. He has no wheezes. He has no rhonchi. He has no rales.  Abdominal: Soft. Normal appearance and bowel sounds are normal. He exhibits no distension. There is no tenderness. There is no rebound and no CVA tenderness.  Musculoskeletal: Normal range of motion. He exhibits no edema or tenderness.  Neurological: He is alert and oriented to person, place, and time. He has normal strength. No cranial nerve deficit or sensory deficit. GCS eye subscore is 4. GCS verbal subscore is 5. GCS motor subscore is  6.  Skin: Skin is warm and dry. No abrasion and no rash noted.  Psychiatric: His speech is normal and behavior is normal. His affect is blunt.  Nursing note and vitals reviewed.   ED Course  Procedures (including critical care time) Labs Review Labs Reviewed  URINE CULTURE  CULTURE, BLOOD (ROUTINE X 2)  CULTURE, BLOOD (ROUTINE X 2)  CBC WITH DIFFERENTIAL/PLATELET  BASIC METABOLIC PANEL  URINALYSIS, ROUTINE W REFLEX MICROSCOPIC (NOT AT [facility])  I-STAT CG4 LACTIC ACID, ED    Imaging Review No results found. I have personally reviewed and evaluated these images and lab results as part of my medical decision-making.   EKG Interpretation None      MDM   Final  diagnoses:  SOB (shortness of breath)    Patient started on IV antibiotics for his urinary tract infection. Will be admitted to the hospital service    Lacretia Leigh, MD 05/30/15 2153

## 2015-05-30 NOTE — H&P (Signed)
[facility] History and Physical  [name] KXF:[id number] DOB: 1930/11/15 DOA: 05/30/2015  Referring physician: ED physician PCP: [name], MD  Specialists:   Chief Complaint: AMS, generalized weakness and hallucination  HPI: [name] is a 82 y.o. male with PMH of indwelling Foley catheter, recurrent UTI, history of positive urine culture for enterococcus, hypertension, hyperlipidemia, dementia, depression, atrial fibrillation not on anticoagulant possibly due to old age and high risk of fall, bladder cancer (s/P surgery and chemotherapy), lung cancer (s/P of partial lung removed), who presents with altered mental status, generalized weakness and hallucination.  Per EMS report, pt has increased weakness, hallucinations and increased confusion. Patient has history of dementia. Patient has an indwelling catheter to a leg bag. Patient's family also reports frequent UTI history. Patient has orange -colored and cloudy urine. When I saw pt in ED, he is confused, but denies symptoms of UTI, nausea, vomiting, abdominal pain or diarrhea. No chest pain, cough or shortness of breath. No fever or chills.  In ED, patient was found to have positive urinalysis with large amount of leukocyte, positive nitrite, lactate 1.07, WBC 14.8, temperature 99.3, no tachycardia, electrolytes and renal function okay. Patient is admitted to inpatient for further evaluation treatment.  EKG: Not done in ED, will get one.   Where does patient live?   At home  Can patient participate in ADLs?    None    Review of Systems: Could not be reviewed accurately due to dementia and worsening mental status.  Allergy:  Allergies  Allergen Reactions  . Sulfa Antibiotics Nausea And Vomiting  . Sulfonamide Derivatives Nausea And Vomiting    Past Medical History  Diagnosis Date  . Atrial fibrillation ([place])   . Hyperlipidemia   . Hypertension   . UTI (urinary tract infection)   . Dementia   .  Bladder cancer ([facility])     hx of surgery and chemo  . Lung cancer [facility])     Status post surgery resection  . Diverticulitis   . Depression 08/19/2014    Past Surgical History  Procedure Laterality Date  . Hernia repair  2006  . Cataract extraction, bilateral  1998, 2000  . Cystectomy  2002  . Lung removal, partial      Social History:  reports that he quit smoking about 23 years ago. His smoking use included Cigarettes. He has never used smokeless tobacco. He reports that he does not drink alcohol or use illicit drugs.  Family History:  Family History  Problem Relation Age of Onset  . Cancer Maternal Aunt     unknown  . Cancer Maternal Grandmother     unknown  . Aneurysm Brother   . Stroke Father     cerebral hemorrhage     Prior to Admission medications   Medication Sig Start Date End Date Taking? Authorizing Provider  ALPHA LIPOIC ACID PO Take 200-300 mg by mouth every morning.    Yes Historical Provider, MD  ascorbic acid (VITAMIN C) 1000 MG tablet Take 1,000 mg by mouth every morning.    Yes Historical Provider, MD  aspirin 81 MG chewable tablet Chew 81 mg by mouth every morning.    Yes Historical Provider, MD  atorvastatin (LIPITOR) 10 MG tablet Take 10 mg by mouth at bedtime.    Yes Historical Provider, MD  buPROPion (WELLBUTRIN) 75 MG tablet Take 75 mg by mouth every morning. daily   Yes Historical Provider, MD  calcium carbonate (OS-CAL - DOSED IN MG OF  ELEMENTAL CALCIUM) 1250 MG tablet Take 1 tablet by mouth daily with breakfast.    Yes Historical Provider, MD  cholecalciferol (VITAMIN D) 1000 UNITS tablet Take 1,000 Units by mouth every morning.    Yes Historical Provider, MD  Cyanocobalamin (VITAMIN B 12 PO) Take 500 mg by mouth every morning.    Yes Historical Provider, MD  hydrocortisone valerate cream (WESTCORT) 0.2 % Apply 1 application topically 2 (two) times daily as needed (itching).    Yes Historical Provider, MD  ibuprofen (ADVIL,MOTRIN) 400 MG tablet Take 1  tablet (400 mg total) by mouth every 6 (six) hours as needed. Take 3 times daily for 3 days, then use every 6 hours as needed. Patient taking differently: Take 400 mg by mouth every 6 (six) hours as needed for fever, headache or moderate pain.  08/22/14  Yes Eugenie Filler, MD  MELATONIN PO Take 1 tablet by mouth at bedtime as needed (sleep).   Yes Historical Provider, MD  methenamine (HIPREX) 1 G tablet Take 1 g by mouth 2 (two) times daily.   Yes Historical Provider, MD  metoprolol succinate (TOPROL-XL) 25 MG 24 hr tablet Take 25 mg by mouth daily.   Yes Historical Provider, MD  nitrofurantoin (MACRODANTIN) 100 MG capsule Take 100 mg by mouth every evening. 10/17/14  Yes Historical Provider, MD  Probiotic Product (PROBIOTIC DAILY PO) Take 460 mg by mouth every morning.    Yes Historical Provider, MD  sertraline (ZOLOFT) 100 MG tablet Take 100 mg by mouth daily.   Yes Historical Provider, MD  Vancomycin (VANCOCIN) 750 MG/150ML SOLN Inject 150 mLs (750 mg total) into the vein every 12 (twelve) hours. Patient not taking: Reported on 11/18/2014 10/27/14   Elmarie Shiley, MD    Physical Exam: Filed Vitals:   05/30/15 1814 05/30/15 1936 05/30/15 2140  BP: 140/82  159/97  Pulse: 64  68  Temp: 98.7 F (37.1 C) 99.3 F (37.4 C)   TempSrc: Oral Rectal   Resp: 16  24  SpO2: 93%  96%   General: Not in acute distress HEENT:       Eyes: PERRL, EOMI, no scleral icterus.       ENT: No discharge from the ears and nose, no pharynx injection, no tonsillar enlargement.        Neck: No JVD, no bruit, no mass felt. Heme: No neck lymph node enlargement. Cardiac: S1/S2, RRR, No murmurs, No gallops or rubs. Pulm: No rales, wheezing, rhonchi or rubs. Abd: Soft, nondistended, nontender, no rebound pain, no organomegaly, BS present. Ext: No pitting leg edema bilaterally. 2+DP/PT pulse bilaterally. Musculoskeletal: No joint deformities, No joint redness or warmth, no limitation of ROM in spin. Skin: No  rashes.  Neuro: Alert, oriented to place and person, but not to time, cranial nerves II-XII grossly intact, moves all extremities normally.  Psych: Patient is not psychotic, no suicidal or hemocidal ideation.  Labs on Admission:  Basic Metabolic Panel:  Recent Labs Lab 05/30/15 1832  NA 142  K 4.2  CL 107  CO2 25  GLUCOSE 116*  BUN 19  CREATININE 1.15  CALCIUM 9.8   Liver Function Tests: No results for input(s): AST, ALT, ALKPHOS, BILITOT, PROT, ALBUMIN in the last 168 hours. No results for input(s): LIPASE, AMYLASE in the last 168 hours. No results for input(s): AMMONIA in the last 168 hours. CBC:  Recent Labs Lab 05/30/15 1832  WBC 14.8*  NEUTROABS 12.0*  HGB 15.5  HCT 47.6  MCV 91.2  PLT 207   Cardiac Enzymes: No results for input(s): CKTOTAL, CKMB, CKMBINDEX, TROPONINI in the last 168 hours.  BNP (last 3 results)  Recent Labs  08/19/14 2032  BNP 107.6*    ProBNP (last 3 results) No results for input(s): PROBNP in the last 8760 hours.  CBG: No results for input(s): GLUCAP in the last 168 hours.  Radiological Exams on Admission: Dg Chest 2 View  05/30/2015  CLINICAL DATA:  Lightheadedness and sickness today. Weakness. History of dementia, lung cancer, bladder cancer. Ex-smoker. EXAM: CHEST  2 VIEW COMPARISON:  Chest x-rays dated 10/27/2014, 08/19/2014 05/21/2014. FINDINGS: Study is hypoinspiratory with crowding of the perihilar bronchovascular markings. Given the low lung volumes, lungs appear stable compared to previous exams. Suspect at least some degree of chronic interstitial lung disease. No evidence of pneumonia. No pleural effusion or pneumothorax seen. Cardiomegaly appears stable given the low lung volumes. Atherosclerotic changes again noted along the walls of the thoracic aorta. Degenerative spurring is again seen throughout the kyphotic thoracic spine. There is an old stable compression fracture deformity within the lower thoracic spine. No evidence  of acute osseous abnormality. IMPRESSION: 1. Low lung volumes. No evidence of acute cardiopulmonary abnormality. No evidence of pneumonia. 2. Cardiomegaly appears stable given the low lung volumes. No evidence of congestive heart failure. Electronically Signed   By: Franki Cabot M.D.   On: 05/30/2015 18:56    Assessment/Plan Principal Problem:   UTI (lower urinary tract infection) Active Problems:   HLD (hyperlipidemia)   HYPERTENSION, BENIGN   Atrial fibrillation (HCC)   History of stroke   Depression   Acute encephalopathy   UTI (lower urinary tract infection): Patient's generalized weakness, altered mental status and hallucinations plus positive urine analysis are consistent with UTI. pt has hx of positive urine culture for enterococcus, which was sensitive to vancomycin on 11/03/14. Patient's lactate level is normal, clinically not septic. Rocephin was started in the ED.   - Admit to telemetry (due to hx of A fib) - Ceftriaxone by IV started in ED - will add Vancomycin - Follow up results of urine and blood cx and amend antibiotic regimen if needed per sensitivity results - prn Zofran for nausea - IVF: 1L of NS bolus in ED, followed by 100 cc/h  Acute encephalopathy due to UTI -Treat UTI as above -Frequent neuro check  HLD: Last LDL was 99 on 12/25/08 -Continue home medications: Lipitor  HTN: -Metoprolol  Depression: Stable, no suicidal or homicidal ideations. -Continue home medications: Wellbutrin and zoloft  Atrial Fibrillation: CHA2DS2-VASc Score is 6, needs oral anticoagulation, but patient is on AC at home possibly due to dementia, old age and high risk of fall. Heart rate is well. Controlled. -metoprolol  Hx of Stroke: -continue ASA and lipitor  DVT ppx: SQ Lovenox  Code Status: Full code Family Communication: None at bed side. Disposition Plan: Admit to inpatient   Date of Service 05/30/2015    Ivor Costa [facility] Pager [phone]  If 7PM-7AM,  please contact night-coverage [email or web address] Password [facility] 05/30/2015, 10:28 PM

## 2015-05-30 NOTE — ED Notes (Signed)
Pt cannot use restroom at this time, aware urine specimen is needed.  

## 2015-05-30 NOTE — Progress Notes (Signed)
Pharmacy Antibiotic Note  [name] is a 82 y.o. male admitted on 05/30/2015 with UTI.  Ceftriaxone was started in the ED.  Patient has h/o Enterococcus UTI in the past, so admitting physician has added Vancomycin for coverage.  Pharmacy has been consulted for Vancomycin dosing.  Plan: Vancomycin 1500 mg IV every 24 hours.  Goal trough 10-15 mcg/mL.  Ceftriaxone per MD F/U cultures  Weight: 203 lb (92.08 kg)  Temp (24hrs), Avg:99 F (37.2 C), Min:98.7 F (37.1 C), Max:99.3 F (37.4 C)   Recent Labs Lab 05/30/15 1832 05/30/15 1841  WBC 14.8*  --   CREATININE 1.15  --   LATICACIDVEN  --  1.07    Estimated Creatinine Clearance: 56 mL/min (by C-G formula based on Cr of 1.15).   4/8 CrCl (n) = 50 ml/min  Allergies  Allergen Reactions  . Sulfa Antibiotics Nausea And Vomiting  . Sulfonamide Derivatives Nausea And Vomiting    Antimicrobials this admission: 4/8 Ceftriaxone >>   4/8 Vanc >>    Dose adjustments this admission:    Microbiology results: 4/8 BCx:   4/8 UCx:     Thank you for allowing pharmacy to be a part of this patient's care.  Everette Rank, PharmD 05/30/2015 10:51 PM

## 2015-05-30 NOTE — ED Notes (Signed)
Bed: WA04 Expected date:  Expected time:  Means of arrival:  Comments: dressing

## 2015-05-30 NOTE — ED Notes (Signed)
Per EMS- patient reports increased weakness, having hallucinations, and increased confusion. patient has a history of dementia. Patient's family also reports frequent UTI history. Patient has orange -colored and cloudy urine. Patient has an indwelling catheter to a leg bag.

## 2015-05-31 DIAGNOSIS — Z8673 Personal history of transient ischemic attack (TIA), and cerebral infarction without residual deficits: Secondary | ICD-10-CM

## 2015-05-31 DIAGNOSIS — L8990 Pressure ulcer of unspecified site, unspecified stage: Secondary | ICD-10-CM | POA: Insufficient documentation

## 2015-05-31 LAB — COMPREHENSIVE METABOLIC PANEL
ALBUMIN: 3.7 g/dL (ref 3.5–5.0)
ALT: 12 U/L — AB (ref 17–63)
AST: 15 U/L (ref 15–41)
Alkaline Phosphatase: 50 U/L (ref 38–126)
Anion gap: 11 (ref 5–15)
BUN: 15 mg/dL (ref 6–20)
CHLORIDE: 107 mmol/L (ref 101–111)
CO2: 23 mmol/L (ref 22–32)
CREATININE: 1.02 mg/dL (ref 0.61–1.24)
Calcium: 8.6 mg/dL — ABNORMAL LOW (ref 8.9–10.3)
GFR calc Af Amer: >60 mL/min (ref >60)
GFR calc non Af Amer: >60 mL/min (ref >60)
GLUCOSE: 110 mg/dL — AB (ref 65–99)
POTASSIUM: 3.7 mmol/L (ref 3.5–5.1)
Sodium: 141 mmol/L (ref 135–145)
Total Bilirubin: 1.3 mg/dL — ABNORMAL HIGH (ref 0.3–1.2)
Total Protein: 6.7 g/dL (ref 6.5–8.1)

## 2015-05-31 LAB — CBC
HEMATOCRIT: 43.9 % (ref 39.0–52.0)
Hemoglobin: 13.9 g/dL (ref 13.0–17.0)
MCH: 29 pg (ref 26.0–34.0)
MCHC: 31.7 g/dL (ref 30.0–36.0)
MCV: 91.5 fL (ref 78.0–100.0)
PLATELETS: 182 10*3/uL (ref 150–400)
RBC: 4.8 MIL/uL (ref 4.22–5.81)
RDW: 14.2 % (ref 11.5–15.5)
WBC: 11.8 10*3/uL — AB (ref 4.0–10.5)

## 2015-05-31 MED ORDER — SACCHAROMYCES BOULARDII 250 MG PO CAPS
250.0000 mg | ORAL_CAPSULE | Freq: Two times a day (BID) | ORAL | Status: DC
  Administered 2015-05-31 – 2015-06-03 (×6): 250 mg via ORAL
  Filled 2015-05-31 (×6): qty 1

## 2015-05-31 NOTE — Evaluation (Signed)
Physical Therapy Evaluation Patient Details Name: [name] MRN: [id number] DOB: Sep 26, 1930 Today's Date: 05/31/2015   History of Present Illness  [name] is a 82 y.o. male with PMH of indwelling Foley catheter, recurrent UTI, hypertension, hyperlipidemia, dementia, depression, atrial fibrillation, bladder cancer (s/P surgery and chemotherapy), lung cancer (S/P of partial lung removed), who presents with altered mental status, generalized weakness and hallucination.  Clinical Impression  The patient required extensive assistance to mobilize  To the recliner, very slow and decreased initiation of movements, posterior lean.  No family present to discuss prior level of function or caregiver  Availability.    Follow Up Recommendations SNF;Supervision/Assistance - 24 hour    Equipment Recommendations  None recommended by PT    Recommendations for Other Services       Precautions / Restrictions Precautions Precautions: Fall      Mobility  Bed Mobility Overal bed mobility: Needs Assistance Bed Mobility: Supine to Sit     Supine to sit: Max assist;HOB elevated     General bed mobility comments: assist for legs and trunk to upright position, posterior leaning.  Transfers Overall transfer level: Needs assistance Equipment used: Rolling walker (2 wheeled) Transfers: Sit to/from [facility] Sit to Stand: Max assist;From elevated surface Stand pivot transfers: Max assist       General transfer comment: stood x 1 with sifnificant leaning, sat down stood again with slight improvement, MUCH extra time to to side step very small steps and then smalll turn to get in front of the recliner. Tactile cues to weight shift, much decreased stepping with the L foot.  Ambulation/Gait       Gait Pattern/deviations: Decreased step length - left;Step-to pattern;Trunk flexed;Festinating;Leaning posteriorly        Stairs            Wheelchair  Mobility    Modified Rankin (Stroke Patients Only)       Balance Overall balance assessment: History of Falls;Needs assistance Sitting-balance support: Feet supported;Bilateral upper extremity supported Sitting balance-Leahy Scale: Poor   Postural control: Posterior lean;Left lateral lean Standing balance support: During functional activity;Bilateral upper extremity supported Standing balance-Leahy Scale: Poor                               Pertinent Vitals/Pain Pain Assessment: No/denies pain    Home Living Family/patient expects to be discharged to:: Private residence Living Arrangements: Other relatives Available Help at Discharge: Family Type of Home: House Home Access: Stairs to enter   Technical brewer of Steps: uncertain Home Layout: Two level   Additional Comments: no family available to confirm    Prior Function Level of Independence: Needs assistance   Gait / Transfers Assistance Needed: ambulates with 4 wheeled     Comments: no family present to provide information related to PLOF.     Hand Dominance        Extremity/Trunk Assessment   Upper Extremity Assessment: Generalized weakness           Lower Extremity Assessment: RLE deficits/detail;LLE deficits/detail RLE Deficits / Details: legs are rigid to  flex when sitting LLE Deficits / Details: same  Cervical / Trunk Assessment: Kyphotic;Other exceptions  Communication   Communication: HOH  Cognition Arousal/Alertness: Awake/alert Behavior During Therapy: Flat affect Overall Cognitive Status: No family/caregiver present to determine baseline cognitive functioning Area of Impairment: Orientation;Following commands;Problem solving       Following Commands: Follows one step commands  inconsistently     Problem Solving: Decreased initiation;Slow processing General Comments: stated [facility]-seemed suroprised that he was at [facility].    General Comments      Exercises         Assessment/Plan    PT Assessment Patient needs continued PT services  PT Diagnosis Difficulty walking;Generalized weakness;Altered mental status   PT Problem List Decreased strength;Decreased range of motion;Decreased activity tolerance;Decreased mobility;Decreased balance;Decreased cognition;Decreased coordination;Decreased knowledge of precautions;Decreased knowledge of use of DME;Decreased safety awareness;Pain  PT Treatment Interventions DME instruction;Gait training;Functional mobility training;Therapeutic activities;Therapeutic exercise;Patient/family education   PT Goals (Current goals can be found in the Care Plan section) Acute Rehab PT Goals Patient Stated Goal: not stated PT Goal Formulation: Patient unable to participate in goal setting Time For Goal Achievement: 06/14/15 Potential to Achieve Goals: Good    Frequency Min 3X/week   Barriers to discharge Decreased caregiver support      Co-evaluation               End of Session Equipment Utilized During Treatment: Gait belt Activity Tolerance: Patient limited by fatigue Patient left: in chair;with call bell/phone within reach Nurse Communication: Mobility status         Time: 0623-7628 PT Time Calculation (min) (ACUTE ONLY): 23 min   Charges:   PT Evaluation $PT Eval Low Complexity: 1 Procedure PT Treatments $Therapeutic Activity: 8-22 mins   PT G Codes:        Claretha Cooper 05/31/2015, 1:38 PM Tresa Endo PT [phone]

## 2015-05-31 NOTE — Progress Notes (Signed)
[facility] PROGRESS NOTE  [name] JIR:[id number] DOB: 1930/08/26 DOA: 05/30/2015 PCP: [name], MD  Assessment/Plan: #1 recurrent urinary tract infection Urine cultures pending. Patient with some improvement with altered mental status. Patient noted to have a prior history positive for enterococcus UTI which was sensitive to vancomycin 11/03/2014. Patient does not look clinically septic. Patient placed on IV Rocephin and IV vancomycin. Continue IV fluids. Supportive care. Once culture results and sensitivities are back will narrow down antibiotics.  #2 acute encephalopathy secondary to urinary tract infection See problem #1.  #3 hyperlipidemia Continue Lipitor.  #4 hypertension Stable. Continue Toprol-XL.  #5 depression Stable. No suicidal or homicidal ideations. Continue Wellbutrin and Zoloft.  #6 chronic atrial fibrillation CHA2DS2VASC 6 Currently rate controlled on Toprol-XL. Patient not on anticoagulation likely secondary to dementia, old age, increased risk of falls. Continue aspirin.  #7 history of CVA Continue aspirin for secondary stroke prevention. Continue Lipitor. Risk factor modification.  #8 prophylaxis Lovenox for DVT prophylaxis.   Code Status: Full Family Communication: Updated patient. No family at bedside. Disposition Plan: Home versus skilled nursing facility when medically stable and at baseline and when urine cultures have resulted.   Consultants:  None  Procedures:  Chest x-ray 05/30/2015  Antibiotics:  IV Rocephin 05/30/2015  IV vancomycin 05/30/2015  HPI/Subjective: Patient states feels better than he did on admission. Patient sitting up in chair. No chest pain. No shortness of breath. Patient complaining of weakness.  Objective: Filed Vitals:   05/31/15 0452 05/31/15 1149  BP: 150/92 134/92  Pulse: 62 95  Temp: 98 F (36.7 C)   Resp: 20     Intake/Output Summary (Last 24 hours) at 05/31/15 1304 Last data filed  at 05/31/15 0453  Gross per 24 hour  Intake 1796.67 ml  Output      0 ml  Net 1796.67 ml   Filed Weights   05/30/15 2248 05/31/15 0003  Weight: 92.08 kg (203 lb) 89.4 kg (197 lb 1.5 oz)    Exam:   General:  NAD  Cardiovascular: RRR  Respiratory: CTAB  Abdomen: Soft, nontender, nondistended, positive bowel sounds.  Musculoskeletal: No clubbing cyanosis or edema.  Data Reviewed: Basic Metabolic Panel:  Recent Labs Lab 05/30/15 1832 05/31/15 0442  NA 142 141  K 4.2 3.7  CL 107 107  CO2 25 23  GLUCOSE 116* 110*  BUN 19 15  CREATININE 1.15 1.02  CALCIUM 9.8 8.6*   Liver Function Tests:  Recent Labs Lab 05/31/15 0442  AST 15  ALT 12*  ALKPHOS 50  BILITOT 1.3*  PROT 6.7  ALBUMIN 3.7   No results for input(s): LIPASE, AMYLASE in the last 168 hours. No results for input(s): AMMONIA in the last 168 hours. CBC:  Recent Labs Lab 05/30/15 1832 05/31/15 0442  WBC 14.8* 11.8*  NEUTROABS 12.0*  --   HGB 15.5 13.9  HCT 47.6 43.9  MCV 91.2 91.5  PLT 207 182   Cardiac Enzymes: No results for input(s): CKTOTAL, CKMB, CKMBINDEX, TROPONINI in the last 168 hours. BNP (last 3 results)  Recent Labs  08/19/14 2032  BNP 107.6*    ProBNP (last 3 results) No results for input(s): PROBNP in the last 8760 hours.  CBG: No results for input(s): GLUCAP in the last 168 hours.  No results found for this or any previous visit (from the past 240 hour(s)).   Studies: Dg Chest 2 View  05/30/2015  CLINICAL DATA:  Lightheadedness and sickness today. Weakness. History of dementia, lung cancer,  bladder cancer. Ex-smoker. EXAM: CHEST  2 VIEW COMPARISON:  Chest x-rays dated 10/27/2014, 08/19/2014 05/21/2014. FINDINGS: Study is hypoinspiratory with crowding of the perihilar bronchovascular markings. Given the low lung volumes, lungs appear stable compared to previous exams. Suspect at least some degree of chronic interstitial lung disease. No evidence of pneumonia. No pleural  effusion or pneumothorax seen. Cardiomegaly appears stable given the low lung volumes. Atherosclerotic changes again noted along the walls of the thoracic aorta. Degenerative spurring is again seen throughout the kyphotic thoracic spine. There is an old stable compression fracture deformity within the lower thoracic spine. No evidence of acute osseous abnormality. IMPRESSION: 1. Low lung volumes. No evidence of acute cardiopulmonary abnormality. No evidence of pneumonia. 2. Cardiomegaly appears stable given the low lung volumes. No evidence of congestive heart failure. Electronically Signed   By: Franki Cabot M.D.   On: 05/30/2015 18:56    Scheduled Meds: . acidophilus  1 capsule Oral q morning - 10a  . aspirin  81 mg Oral q morning - 10a  . atorvastatin  10 mg Oral QHS  . buPROPion  75 mg Oral q morning - 10a  . calcium carbonate  1 tablet Oral Q breakfast  . cefTRIAXone (ROCEPHIN)  IV  1 g Intravenous Q24H  . cholecalciferol  1,000 Units Oral q morning - 10a  . cyanocobalamin  500 mcg Oral q morning - 10a  . enoxaparin (LOVENOX) injection  40 mg Subcutaneous QHS  . methenamine  1,000 mg Oral BID  . metoprolol succinate  25 mg Oral Daily  . saccharomyces boulardii  250 mg Oral BID  . sertraline  100 mg Oral Daily  . sodium chloride flush  3 mL Intravenous Q12H  . vancomycin  1,500 mg Intravenous Q24H  . ascorbic acid  1,000 mg Oral q morning - 10a   Continuous Infusions: . sodium chloride Stopped (05/30/15 2130)  . sodium chloride 100 mL/hr at 05/31/15 1154    Principal Problem:   UTI (lower urinary tract infection) Active Problems:   Acute encephalopathy   HLD (hyperlipidemia)   HYPERTENSION, BENIGN   Atrial fibrillation ([place])   History of stroke   Depression   Pressure ulcer    Time spent: 37 mins    [facility] MD [facility] Pager [phone]. If 7PM-7AM, please contact night-coverage at [email or web address], password [facility] 05/31/2015, 1:04 PM  LOS: 1 day

## 2015-05-31 NOTE — Progress Notes (Signed)
Utilization review completed.  

## 2015-06-01 LAB — CBC
HEMATOCRIT: 41.8 % (ref 39.0–52.0)
HEMOGLOBIN: 13.7 g/dL (ref 13.0–17.0)
MCH: 29.1 pg (ref 26.0–34.0)
MCHC: 32.8 g/dL (ref 30.0–36.0)
MCV: 88.7 fL (ref 78.0–100.0)
Platelets: 176 10*3/uL (ref 150–400)
RBC: 4.71 MIL/uL (ref 4.22–5.81)
RDW: 14.1 % (ref 11.5–15.5)
WBC: 11.8 10*3/uL — AB (ref 4.0–10.5)

## 2015-06-01 LAB — BASIC METABOLIC PANEL
ANION GAP: 10 (ref 5–15)
BUN: 15 mg/dL (ref 6–20)
CHLORIDE: 110 mmol/L (ref 101–111)
CO2: 22 mmol/L (ref 22–32)
CREATININE: 0.94 mg/dL (ref 0.61–1.24)
Calcium: 8 mg/dL — ABNORMAL LOW (ref 8.9–10.3)
GFR calc non Af Amer: >60 mL/min (ref >60)
Glucose, Bld: 99 mg/dL (ref 65–99)
POTASSIUM: 3.5 mmol/L (ref 3.5–5.1)
SODIUM: 142 mmol/L (ref 135–145)

## 2015-06-01 MED ORDER — POTASSIUM CHLORIDE CRYS ER 20 MEQ PO TBCR
40.0000 meq | EXTENDED_RELEASE_TABLET | Freq: Once | ORAL | Status: AC
  Administered 2015-06-01: 40 meq via ORAL
  Filled 2015-06-01: qty 2

## 2015-06-01 MED ORDER — CEFEPIME HCL 2 G IJ SOLR
2.0000 g | Freq: Two times a day (BID) | INTRAMUSCULAR | Status: DC
  Administered 2015-06-01 – 2015-06-02 (×2): 2 g via INTRAVENOUS
  Filled 2015-06-01 (×3): qty 2

## 2015-06-01 NOTE — [place] FL2 (Signed)
[place] MEDICAID FL2 LEVEL OF CARE SCREENING TOOL     IDENTIFICATION  Patient Name: [name] Birthdate: 1930/10/15 Sex: male Admission Date (Current Location): 05/30/2015  [facility] and [place] Number:  Herbalist and Address:  [facility],  [address], [place]      Provider Number: [id number]  Attending Physician Name and Address:  Eugenie Filler, MD  Relative Name and Phone Number:       Current Level of Care: Hospital Recommended Level of Care: [place] Prior Approval Number:    Date Approved/Denied:   PASRR Number: [id number]  Discharge Plan: SNF    Current Diagnoses: Patient Active Problem List   Diagnosis Date Noted  . Pressure ulcer 05/31/2015  . Hip pain   . Acute encephalopathy   . GERD (gastroesophageal reflux disease) 08/19/2014  . Depression 08/19/2014  . UTI (lower urinary tract infection) 08/19/2014  . UTI (urinary tract infection) due to Enterococcus faecium 05/21/2014  . Bradycardia 05/03/2012  . Fall 04/30/2012  . Low back pain 04/30/2012  . Lung cancer ([place]) 10/10/2011  . Atrial fibrillation ([place]) 10/10/2011  . History of stroke 10/10/2011  . Malignant neoplasm of bladder ([place]) 03/13/2008  . HLD (hyperlipidemia) 03/13/2008  . HYPERTENSION, BENIGN 02/13/2008    Orientation RESPIRATION BLADDER Height & Weight     Self, Place  Normal Incontinent, Indwelling catheter Weight: 197 lb 1.5 oz (89.4 kg) Height:  '6\' 1"'$  (185.4 cm)  BEHAVIORAL SYMPTOMS/MOOD NEUROLOGICAL BOWEL NUTRITION STATUS     (NONE) Incontinent Diet (Diet Heart)  AMBULATORY STATUS COMMUNICATION OF NEEDS Skin   Extensive Assist Verbally PU Stage and Appropriate Care (Stage II to mid sacrum)   PU Stage 2 Dressing: Daily (foam dressing changes)                   Personal Care Assistance Level of Assistance  Bathing, Feeding, Dressing Bathing Assistance: Maximum assistance Feeding assistance: Limited  assistance Dressing Assistance: Maximum assistance     Functional Limitations Info  Sight, Hearing, Speech Sight Info: Adequate Hearing Info: Adequate Speech Info: Adequate    SPECIAL CARE FACTORS FREQUENCY  PT (By licensed PT), OT (By licensed OT)     PT Frequency: 5 x a week OT Frequency: 5 x a week            Contractures Contractures Info: Not present    Additional Factors Info  Code Status, Allergies, Psychotropic Code Status Info: FULL code status Allergies Info: Sulfa Antibiotics, Sulfonamide Derivatives Psychotropic Info: zoloft         Current Medications (06/01/2015):  This is the current hospital active medication list Current Facility-Administered Medications  Medication Dose Route Frequency Provider Last Rate Last Dose  . 0.9 %  sodium chloride infusion   Intravenous Continuous Lacretia Leigh, MD   Stopped at 05/30/15 2130  . 0.9 %  sodium chloride infusion   Intravenous Continuous Ivor Costa, MD 100 mL/hr at 06/01/15 0947    . acetaminophen (TYLENOL) tablet 650 mg  650 mg Oral Q6H PRN Ivor Costa, MD       Or  . acetaminophen (TYLENOL) suppository 650 mg  650 mg Rectal Q6H PRN Ivor Costa, MD      . acidophilus (RISAQUAD) capsule 1 capsule  1 capsule Oral q morning - 10a Ivor Costa, MD   1 capsule at 06/01/15 0947  . aspirin chewable tablet 81 mg  81 mg Oral q morning - 10a Ivor Costa, MD  81 mg at 06/01/15 0948  . atorvastatin (LIPITOR) tablet 10 mg  10 mg Oral QHS Ivor Costa, MD   10 mg at 05/31/15 2056  . buPROPion [facility]) tablet 75 mg  75 mg Oral q morning - 10a Ivor Costa, MD   75 mg at 06/01/15 0947  . calcium carbonate (OS-CAL - dosed in mg of elemental calcium) tablet 500 mg of elemental calcium  1 tablet Oral Q breakfast Ivor Costa, MD   500 mg of elemental calcium at 06/01/15 0948  . cefTRIAXone (ROCEPHIN) 1 g in dextrose 5 % 50 mL IVPB  1 g Intravenous Q24H Lacretia Leigh, MD 100 mL/hr at 05/31/15 2056 1 g at 05/31/15 2056  . cholecalciferol (VITAMIN D)  tablet 1,000 Units  1,000 Units Oral q morning - 10a Ivor Costa, MD   1,000 Units at 06/01/15 0947  . cyanocobalamin tablet 500 mcg  500 mcg Oral q morning - 10a Ivor Costa, MD   500 mcg at 06/01/15 0947  . enoxaparin (LOVENOX) injection 40 mg  40 mg Subcutaneous QHS Ivor Costa, MD   40 mg at 05/31/15 2200  . hydrocortisone valerate cream (WESTCORT) 0.2 % 1 application  1 application Topical BID PRN Ivor Costa, MD      . methenamine (MANDELAMINE) tablet 1,000 mg  1,000 mg Oral BID Ivor Costa, MD   1,000 mg at 06/01/15 0947  . metoprolol succinate (TOPROL-XL) 24 hr tablet 25 mg  25 mg Oral Daily Ivor Costa, MD   25 mg at 06/01/15 0947  . ondansetron (ZOFRAN) injection 4 mg  4 mg Intravenous Q8H PRN Ivor Costa, MD      . saccharomyces boulardii (FLORASTOR) capsule 250 mg  250 mg Oral BID Eugenie Filler, MD   250 mg at 06/01/15 0948  . sertraline (ZOLOFT) tablet 100 mg  100 mg Oral Daily Ivor Costa, MD   100 mg at 06/01/15 0948  . sodium chloride flush (NS) 0.9 % injection 3 mL  3 mL Intravenous Q12H Ivor Costa, MD   3 mL at 05/30/15 2230  . vancomycin (VANCOCIN) 1,500 mg in sodium chloride 0.9 % 500 mL IVPB  1,500 mg Intravenous Q24H Leann T Poindexter, RPH   1,500 mg at 05/31/15 2244  . vitamin C (ASCORBIC ACID) tablet 1,000 mg  1,000 mg Oral q morning - 10a Ivor Costa, MD   1,000 mg at 06/01/15 1275     Discharge Medications: Please see discharge summary for a list of discharge medications.  Relevant Imaging Results:  Relevant Lab Results:   Additional Information SSN: [id number]  Leslie Jester A, LCSW

## 2015-06-01 NOTE — Clinical Social Work Note (Signed)
Clinical Social Work Assessment  Patient Details  Name: [name] MRN: [id number] Date of Birth: 09/12/1930  Date of referral:  06/01/15               Reason for consult:  Discharge Planning                Permission sought to share information with:  Family Supports Permission granted to share information::  Yes, Verbal Permission Granted  Name::     [name]  Agency::     Relationship::  Research scientist (life sciences) Information:  [phone]  Housing/Transportation Living arrangements for the past 2 months:  Single Family Home Source of Information:  Power of [place], Spouse (Pt granddaughter is POA, [name]) Patient Interpreter Needed:  None Criminal Activity/Legal Involvement Pertinent to Current Situation/Hospitalization:  No - Comment as needed Significant Relationships:  Other Family Members, Spouse (granddaughter) Lives with:  Spouse, Other (Comment) (pt lives at pt granddaughter's home) Do you feel safe going back to the place where you live?  Yes Need for family participation in patient care:  Yes (Comment)  Care giving concerns:  Pt admitted from home where pt and pt wife live with pt granddaughter. Pt granddaughter reports pt has comfort keepers 12 hours a day and pt family assist with care in the evening. No care giving concerns identified.    Social Worker assessment / plan:  CSW received referral for New SNF.  CSW visited pt room. Pt wife at bedside.  Pt wife reports that she and pt live at home with pt granddaughter. Pt agreeable to CSW contacting pt granddaughter to discuss pt needs as pt granddaughter is POA.  CSW contacted pt granddaughter, [name] via telephone. CSW introduced self and explained role. Pt granddaughter confirmed that pt lives with her. Pt granddaughter explained that pt lives in pt granddaughter basement and pt has comfort keepers for 12 hours a day and pt family assist with care at night. CSW discussed PT recommendations and  pt granddaughter reports that pt appears at baseline with pt care needs. Pt granddaughter states that pt does not want to go to a SNF and she wants to respect those wishes in order for pt to remain at home. Pt granddaughter reports that pt has all equipment needs and pt granddaughter is not even sure that home health would be beneficial as they go to the urologist monthly and see pt PCP very regularly.   Pt granddaughter would like update from MD. CSW placed sticky note in pt chart to request MD to contact pt granddaughter to update. Pt granddaughter reports that pt spouse has difficulty comprehending information to report to pt granddaughter.   CSW updated unit RN and RNCM that pt granddaughter wishes for pt to return home at discharge.  No further social work needs identified at this time.  CSW signing off.   Employment status:  Retired Forensic scientist:  [employer] PT Recommendations:  [place], [place] / Referral to community resources:  Other (Comment Required) (referral to [facility] as pt family declines SNF)  Patient/Family's Response to care:  Pt alert and oriented x 2. Pt pleasant. Pt granddaughter supportive and actively involved in pt care. Pt granddaughter wants to respect pt wishes to remain at home and pt has 24 hour supervision in the home.   Patient/Family's Understanding of and Emotional Response to Diagnosis, Current Treatment, and Prognosis:  :  Pt granddaughter eager to receive update from MD as she had not yet  been able to get a full understanding of pt diagnosis and treatment plan.   Emotional Assessment Appearance:  Appears stated age Attitude/Demeanor/Rapport:  Other (appropriate, oriented x 2 only) Affect (typically observed):  Appropriate (oriented x 2 only-full assessment completed with pt granddaughter, [name]) Orientation:  Oriented to Self, Oriented to Place Alcohol / Substance use:  Not Applicable Psych  involvement (Current and /or in the community):  No (Comment)  Discharge Needs  Concerns to be addressed:  Discharge Planning Concerns Readmission within the last 30 days:  No Current discharge risk:  None Barriers to Discharge:  Continued Medical Work up   Ladell Pier, [place] 06/01/2015, 11:52 AM  [phone]

## 2015-06-01 NOTE — Progress Notes (Signed)
Occupational Therapy Evaluation Patient Details Name: [name] MRN: [id number] DOB: 21-Jun-1930 Today's Date: 06/01/2015    History of Present Illness [name] is a 82 y.o. male with PMH of indwelling Foley catheter, recurrent UTI, hypertension, hyperlipidemia, dementia, depression, atrial fibrillation, bladder cancer (s/P surgery and chemotherapy), lung cancer (S/P of partial lung removed), who presents with altered mental status, generalized weakness and hallucination.   Clinical Impression   Patient presents to OT requiring extensive assistance with ADLs and would benefit from skilled OT to address. His PLOF and available caregiver assistance is unclear. Recommend SNF as d/c plan. OT will follow.    Follow Up Recommendations  SNF    Equipment Recommendations  Other (comment) (tbd next venue of care)    Recommendations for Other Services       Precautions / Restrictions Precautions Precautions: Fall Precaution Comments: patient states, "My main problem is falling." Restrictions Weight Bearing Restrictions: No      Mobility Bed Mobility Overal bed mobility: Needs Assistance             General bed mobility comments: pt in poor positioning with head against bedrail. Mod A reposition in bed.  Transfers                      Balance                                            ADL Overall ADL's : Needs assistance/impaired Eating/Feeding: Set up;Bed level   Grooming: Wash/dry hands;Wash/dry face;Set up;Bed level   Upper Body Bathing: Minimal assitance;Bed level   Lower Body Bathing: Maximal assistance;Total assistance;Bed level   Upper Body Dressing : Minimal assistance;Moderate assistance;Bed level   Lower Body Dressing: Total assistance;Bed level               Functional mobility during ADLs:  (pt refused EOB/OOB, stating, "I'm tired right now.") General ADL Comments: Patient received in poor positioning  in bed with back of head against bed rail. Assisted patient with repositioning in bed. Patient reports he is tired and declined EOB/OOB. Difficult to get PLOF and unsure if information he gave me is accurate. Patient participated in bed level grooming, UB self-care. Max/total A LB self-care bed level. OT will follow.     Vision     Perception     Praxis      Pertinent Vitals/Pain Pain Assessment: No/denies pain     Hand Dominance Right   Extremity/Trunk Assessment Upper Extremity Assessment Upper Extremity Assessment: Generalized weakness   Lower Extremity Assessment Lower Extremity Assessment: Defer to PT evaluation       Communication Communication Communication: HOH   Cognition Arousal/Alertness: Awake/alert Behavior During Therapy: Flat affect Overall Cognitive Status: No family/caregiver present to determine baseline cognitive functioning Area of Impairment: Orientation;Following commands;Problem solving Orientation Level: Place;Time;Situation   Memory: Decreased short-term memory Following Commands: Follows one step commands inconsistently     Problem Solving: Decreased initiation;Slow processing     General Comments       Exercises       Shoulder Instructions      Home Living Family/patient expects to be discharged to:: Private residence Living Arrangements: Other relatives Available Help at Discharge: Family Type of Home: House Home Access: Stairs to enter [employer] of Steps: uncertain   Home Layout: Two level  Bathroom Shower/Tub: Occupational psychologist: Standard     Home Equipment: Environmental consultant - 4 wheels;Shower seat;Grab bars - toilet;Grab bars - tub/shower   Additional Comments: no family available to confirm      Prior Functioning/Environment Level of Independence: Needs assistance  Gait / Transfers Assistance Needed: ambulates with 4 wheeled ADL's / Homemaking Assistance Needed: granddaughter assists pt with  showers, pt independent with getting dressed, granddaughter does cooking/cleaning   Comments: no family present to provide information related to PLOF.    OT Diagnosis: Generalized weakness;Cognitive deficits   OT Problem List: Decreased strength;Decreased activity tolerance;Impaired balance (sitting and/or standing);Decreased knowledge of use of DME or AE;Decreased cognition   OT Treatment/Interventions: Self-care/ADL training;DME and/or AE instruction;Therapeutic activities;Patient/family education    OT Goals(Current goals can be found in the care plan section) Acute Rehab OT Goals Patient Stated Goal: not stated OT Goal Formulation: Patient unable to participate in goal setting Time For Goal Achievement: 06/15/15 Potential to Achieve Goals: Good ADL Goals Pt Will Perform Upper Body Bathing: with supervision;sitting Pt Will Perform Lower Body Bathing: with min assist;sit to/from stand Pt Will Perform Upper Body Dressing: with supervision;sitting Pt Will Perform Lower Body Dressing: with min assist;sit to/from stand Pt Will Transfer to Toilet: with min assist;bedside commode Pt Will Perform Toileting - Clothing Manipulation and hygiene: with min assist;sit to/from stand  OT Frequency: Min 2X/week   Barriers to D/C:            Co-evaluation              End of Session    Activity Tolerance: Patient limited by fatigue Patient left: in bed;with call bell/phone within reach;with bed alarm set   Time: 6578-4696 OT Time Calculation (min): 12 min Charges:  OT General Charges $OT Visit: 1 Procedure OT Evaluation $OT Eval Moderate Complexity: 1 Procedure G-Codes:    Shahida Schnackenberg A 06/27/15, 9:47 AM

## 2015-06-01 NOTE — Progress Notes (Signed)
[facility] PROGRESS NOTE  [name] KGM:[id number] DOB: 1930-05-04 DOA: 05/30/2015 PCP: [name], MD  Assessment/Plan: #1 recurrent urinary tract infection secondary to Pseudomonas aeruginosa Urine cultures with Pseudomonas aeruginosa. Sensitivities pending.. Patient with some improvement with altered mental status. Patient noted to have a prior history positive for enterococcus UTI which was sensitive to vancomycin 11/03/2014. Patient does not look clinically septic. Patient placed on IV Rocephin and IV vancomycin. Continue IV fluids. Supportive care. Change IV Rocephin 2 IV cefepime. Discontinue IV vancomycin. Await sensitivities.   #2 acute encephalopathy secondary to pseudomonas aeruginosa urinary tract infection See problem #1.  #3 hyperlipidemia Continue Lipitor.  #4 hypertension Stable. Continue Toprol-XL.  #5 depression Stable. No suicidal or homicidal ideations. Continue Wellbutrin and Zoloft.  #6 chronic atrial fibrillation CHA2DS2VASC 6 Currently rate controlled on Toprol-XL. Patient not on anticoagulation likely secondary to dementia, old age, increased risk of falls. Continue aspirin.  #7 history of CVA Continue aspirin for secondary stroke prevention. Continue Lipitor. Risk factor modification.  #8 prophylaxis Lovenox for DVT prophylaxis.   Code Status: Full Family Communication: Updated patient. Updated granddaughter via telephone.  Disposition Plan: Home once urine culture sensitivities have pending hopefully tomorrow.   Consultants:  None  Procedures:  Chest x-ray 05/30/2015  Antibiotics:  IV Rocephin 05/30/2015>>>>>> 06/01/2015  IV vancomycin 05/30/2015>>>>>>06/01/2015  IV cefepime 06/01/2015  HPI/Subjective: Patient states feels better than he did on admission. No chest pain. No shortness of breath. Patient asking to be discharged home today.   Objective: Filed Vitals:   06/01/15 0554 06/01/15 1234  BP: 158/96 120/76  Pulse:  93 72  Temp: 98.4 F (36.9 C) 97.6 F (36.4 C)  Resp: 18 18    Intake/Output Summary (Last 24 hours) at 06/01/15 1245 Last data filed at 06/01/15 0827  Gross per 24 hour  Intake 3131.66 ml  Output   1600 ml  Net 1531.66 ml   Filed Weights   05/30/15 2248 05/31/15 0003  Weight: 92.08 kg (203 lb) 89.4 kg (197 lb 1.5 oz)    Exam:   General:  NAD  Cardiovascular: RRR  Respiratory: CTAB  Abdomen: Soft, nontender, nondistended, positive bowel sounds.  Musculoskeletal: No clubbing cyanosis or edema.  Data Reviewed: Basic Metabolic Panel:  Recent Labs Lab 05/30/15 1832 05/31/15 0442 06/01/15 0426  NA 142 141 142  K 4.2 3.7 3.5  CL 107 107 110  CO2 '25 23 22  '$ GLUCOSE 116* 110* 99  BUN '19 15 15  '$ CREATININE 1.15 1.02 0.94  CALCIUM 9.8 8.6* 8.0*   Liver Function Tests:  Recent Labs Lab 05/31/15 0442  AST 15  ALT 12*  ALKPHOS 50  BILITOT 1.3*  PROT 6.7  ALBUMIN 3.7   No results for input(s): LIPASE, AMYLASE in the last 168 hours. No results for input(s): AMMONIA in the last 168 hours. CBC:  Recent Labs Lab 05/30/15 1832 05/31/15 0442 06/01/15 0426  WBC 14.8* 11.8* 11.8*  NEUTROABS 12.0*  --   --   HGB 15.5 13.9 13.7  HCT 47.6 43.9 41.8  MCV 91.2 91.5 88.7  PLT 207 182 176   Cardiac Enzymes: No results for input(s): CKTOTAL, CKMB, CKMBINDEX, TROPONINI in the last 168 hours. BNP (last 3 results)  Recent Labs  08/19/14 2032  BNP 107.6*    ProBNP (last 3 results) No results for input(s): PROBNP in the last 8760 hours.  CBG: No results for input(s): GLUCAP in the last 168 hours.  Recent Results (from the past 240 hour(s))  Culture,  blood (Routine X 2) w Reflex to ID Panel     Status: None (Preliminary result)   Collection Time: 05/30/15  6:31 PM  Result Value Ref Range Status   Specimen Description BLOOD LEFT ANTECUBITAL  Final   Special Requests BOTTLES DRAWN AEROBIC AND ANAEROBIC 5CC  Final   Culture   Final    NO GROWTH 1  DAY Performed at [facility]    Report Status PENDING  Incomplete  Culture, blood (Routine X 2) w Reflex to ID Panel     Status: None (Preliminary result)   Collection Time: 05/30/15  6:32 PM  Result Value Ref Range Status   Specimen Description BLOOD RIGHT HAND  Final   Special Requests BOTTLES DRAWN AEROBIC ONLY 5CC  Final   Culture   Final    NO GROWTH 1 DAY Performed at [facility]    Report Status PENDING  Incomplete  Urine culture     Status: Abnormal (Preliminary result)   Collection Time: 05/30/15  8:21 PM  Result Value Ref Range Status   Specimen Description URINE, RANDOM  Final   Special Requests NONE  Final   Culture >=100,000 COLONIES/mL PSEUDOMONAS AERUGINOSA (A)  Final   Report Status PENDING  Incomplete     Studies: Dg Chest 2 View  05/30/2015  CLINICAL DATA:  Lightheadedness and sickness today. Weakness. History of dementia, lung cancer, bladder cancer. Ex-smoker. EXAM: CHEST  2 VIEW COMPARISON:  Chest x-rays dated 10/27/2014, 08/19/2014 05/21/2014. FINDINGS: Study is hypoinspiratory with crowding of the perihilar bronchovascular markings. Given the low lung volumes, lungs appear stable compared to previous exams. Suspect at least some degree of chronic interstitial lung disease. No evidence of pneumonia. No pleural effusion or pneumothorax seen. Cardiomegaly appears stable given the low lung volumes. Atherosclerotic changes again noted along the walls of the thoracic aorta. Degenerative spurring is again seen throughout the kyphotic thoracic spine. There is an old stable compression fracture deformity within the lower thoracic spine. No evidence of acute osseous abnormality. IMPRESSION: 1. Low lung volumes. No evidence of acute cardiopulmonary abnormality. No evidence of pneumonia. 2. Cardiomegaly appears stable given the low lung volumes. No evidence of congestive heart failure. Electronically Signed   By: Franki Cabot M.D.   On: 05/30/2015 18:56     Scheduled Meds: . acidophilus  1 capsule Oral q morning - 10a  . aspirin  81 mg Oral q morning - 10a  . atorvastatin  10 mg Oral QHS  . buPROPion  75 mg Oral q morning - 10a  . calcium carbonate  1 tablet Oral Q breakfast  . cefTRIAXone (ROCEPHIN)  IV  1 g Intravenous Q24H  . cholecalciferol  1,000 Units Oral q morning - 10a  . cyanocobalamin  500 mcg Oral q morning - 10a  . enoxaparin (LOVENOX) injection  40 mg Subcutaneous QHS  . methenamine  1,000 mg Oral BID  . metoprolol succinate  25 mg Oral Daily  . saccharomyces boulardii  250 mg Oral BID  . sertraline  100 mg Oral Daily  . sodium chloride flush  3 mL Intravenous Q12H  . vancomycin  1,500 mg Intravenous Q24H  . ascorbic acid  1,000 mg Oral q morning - 10a   Continuous Infusions: . sodium chloride Stopped (05/30/15 2130)  . sodium chloride 100 mL/hr at 06/01/15 6237    Principal Problem:   UTI (lower urinary tract infection) Active Problems:   Acute encephalopathy   HLD (hyperlipidemia)   HYPERTENSION, BENIGN  Atrial fibrillation ([place])   History of stroke   Depression   Pressure ulcer    Time spent: 35 mins    [facility] MD [facility] Pager [phone]. If 7PM-7AM, please contact night-coverage at [email or web address], password [facility] 06/01/2015, 12:45 PM  LOS: 2 days

## 2015-06-01 NOTE — Progress Notes (Signed)
Pharmacy Antibiotic Note  [name] is a 82 y.o. male admitted on 05/30/2015 with UTI.  Patient has h/o of enterococcus UTI in the past, was started on ceftriaxone and vancomycin on admission.  Urine culture now back with Pseudomonas.  Pharmacy has been consulted to change ceftriaxone to cefepime.  Plan: -Cefepime 2gm IV q12h -D/c ceftriaxone -Continue vancomycin '1500mg'$  q24h; consider d/c vancomycin if only treating for UTI  _____________________________  Height: '6\' 1"'$  (185.4 cm) Weight: 197 lb 1.5 oz (89.4 kg) IBW/kg (Calculated) : 79.9  Temp (24hrs), Avg:98.3 F (36.8 C), Min:97.6 F (36.4 C), Max:98.5 F (36.9 C)   Recent Labs Lab 05/30/15 1832 05/30/15 1841 05/31/15 0442 06/01/15 0426  WBC 14.8*  --  11.8* 11.8*  CREATININE 1.15  --  1.02 0.94  LATICACIDVEN  --  1.07  --   --     Estimated Creatinine Clearance: 68.5 mL/min (by C-G formula based on Cr of 0.94).    Allergies  Allergen Reactions  . Sulfa Antibiotics Nausea And Vomiting  . Sulfonamide Derivatives Nausea And Vomiting    Antimicrobials this admission: 4/8 ceftriaxone >> 4/10 4/8 vancomycin >> 4/10 4/10 cefepime >>  Dose adjustments this admission:   Microbiology results: 4/9 BCx: NG1D 4/8 UCx: Pseudomonas   Thank you for allowing pharmacy to be a part of this patient's care.  Tonette Bihari, PharmD candidate 06/01/2015 1:26 PM

## 2015-06-02 LAB — BASIC METABOLIC PANEL
ANION GAP: 9 (ref 5–15)
BUN: 15 mg/dL (ref 4–21)
BUN: 15 mg/dL (ref 6–20)
CO2: 22 mmol/L (ref 22–32)
CREATININE: 1 mg/dL (ref 0.6–1.3)
Calcium: 8.1 mg/dL — ABNORMAL LOW (ref 8.9–10.3)
Chloride: 112 mmol/L — ABNORMAL HIGH (ref 101–111)
Creatinine, Ser: 1.02 mg/dL (ref 0.61–1.24)
GFR calc Af Amer: >60 mL/min (ref >60)
GFR calc non Af Amer: >60 mL/min (ref >60)
GLUCOSE: 94 mg/dL
Glucose, Bld: 94 mg/dL (ref 65–99)
POTASSIUM: 3.7 mmol/L (ref 3.5–5.1)
SODIUM: 143 mmol/L (ref 137–147)
SODIUM: 143 mmol/L (ref 135–145)

## 2015-06-02 LAB — URINE CULTURE: Culture: >=100000 — AB

## 2015-06-02 LAB — CBC
HCT: 40.4 % (ref 39.0–52.0)
HEMOGLOBIN: 13.4 g/dL (ref 13.0–17.0)
MCH: 29.3 pg (ref 26.0–34.0)
MCHC: 33.2 g/dL (ref 30.0–36.0)
MCV: 88.4 fL (ref 78.0–100.0)
Platelets: 188 10*3/uL (ref 150–400)
RBC: 4.57 MIL/uL (ref 4.22–5.81)
RDW: 14.2 % (ref 11.5–15.5)
WBC: 11.2 10*3/uL — AB (ref 4.0–10.5)

## 2015-06-02 LAB — CBC AND DIFFERENTIAL: WBC: 11.2 10^3/mL

## 2015-06-02 MED ORDER — ALBUTEROL SULFATE (2.5 MG/3ML) 0.083% IN NEBU
5.0000 mg | INHALATION_SOLUTION | Freq: Once | RESPIRATORY_TRACT | Status: AC
  Administered 2015-06-02: 5 mg via RESPIRATORY_TRACT
  Filled 2015-06-02: qty 6

## 2015-06-02 MED ORDER — IPRATROPIUM-ALBUTEROL 0.5-2.5 (3) MG/3ML IN SOLN: 3.0000 mL | Freq: Four times a day (QID) | RESPIRATORY_TRACT | Status: DC | PRN

## 2015-06-02 MED ORDER — LEVOFLOXACIN 250 MG PO TABS
250.0000 mg | ORAL_TABLET | Freq: Every day | ORAL | Status: DC
  Administered 2015-06-02 – 2015-06-03 (×2): 250 mg via ORAL
  Filled 2015-06-02 (×2): qty 1

## 2015-06-02 MED ORDER — IPRATROPIUM BROMIDE 0.02 % IN SOLN
0.5000 mg | Freq: Once | RESPIRATORY_TRACT | Status: AC
  Administered 2015-06-02: 0.5 mg via RESPIRATORY_TRACT
  Filled 2015-06-02: qty 2.5

## 2015-06-02 MED ORDER — NITROFURANTOIN MACROCRYSTAL 100 MG PO CAPS: 100.0000 mg | ORAL_CAPSULE | Freq: Every evening | ORAL | Status: DC

## 2015-06-02 MED ORDER — SACCHAROMYCES BOULARDII 250 MG PO CAPS: 250.0000 mg | ORAL_CAPSULE | Freq: Two times a day (BID) | ORAL | Status: DC

## 2015-06-02 MED ORDER — LEVOFLOXACIN 250 MG PO TABS: 250.0000 mg | ORAL_TABLET | Freq: Every day | ORAL | Status: DC

## 2015-06-02 NOTE — Clinical Social Work Placement (Signed)
   CLINICAL SOCIAL WORK PLACEMENT  NOTE  Date:  06/02/2015  Patient Details  Name: [name] MRN: [id number] Date of Birth: 1930/07/26  Clinical Social Work is seeking post-discharge placement for this patient at the [place] level of care (*CSW will initial, date and re-position this form in  chart as items are completed):  Yes   Patient/family provided with [facility] Department's list of facilities offering this level of care within the geographic area requested by the patient (or if unable, by the patient's family).  Yes   Patient/family informed of their freedom to choose among providers that offer the needed level of care, that participate in Medicare, Medicaid or managed care program needed by the patient, have an available bed and are willing to accept the patient.  Yes   Patient/family informed of [facility]'s ownership interest in [facility] and [facility], as well as of the fact that they are under no obligation to receive care at these facilities.  PASRR submitted to EDS on       PASRR number received on       Existing PASRR number confirmed on 06/02/15     FL2 transmitted to all facilities in geographic area requested by pt/family on 06/02/15     FL2 transmitted to all facilities within larger geographic area on       Patient informed that his/her managed care company has contracts with or will negotiate with certain facilities, including the following:            Patient/family informed of bed offers received.  Patient chooses bed at       Physician recommends and patient chooses bed at      Patient to be transferred to   on  .  Patient to be transferred to facility by       Patient family notified on   of transfer.  Name of family member notified:        PHYSICIAN Please sign FL2     Additional Comment:    _______________________________________________ Ladell Pier, LCSW 06/02/2015, 4:03  PM

## 2015-06-02 NOTE — Progress Notes (Signed)
CSW was re-consulted for SNF as pt was planned for d/c home, but caregiver arrived to hospital and pt required more assistance than caregiver is able to provide in the home. Pt wife unable to provide the care that pt needs and pt granddaughter is out of town and at this time pt is not safe to discharge to home.   CSW spoke to pt at bedside and pt granddaughter, [name] via telephone. Pt and pt family are now in agreement to SNF.   CSW completed FL2 and initiated SNF search. CSW to follow up with bed offers and will plan for d/c to SNF tomorrow. Pt family asked about [facility], but CSW spoke with [facility] and facility was unable to offer a bed. [place] Search in process.   Alison Murray, MSW, [facility] [phone]

## 2015-06-02 NOTE — Discharge Summary (Signed)
Physician Discharge Summary  WILLEY DUE WRU:[id number] DOB: 12-26-1930 DOA: 05/30/2015  PCP: [name], MD  Admit date: 05/30/2015 Discharge date: 06/02/2015  Time spent: 65 minutes  Recommendations for Outpatient Follow-up:  1. Follow-up with Dr. Diona Fanti of urology in 1-2 weeks.   Discharge Diagnoses:  Principal Problem:   UTI (lower urinary tract infection) Active Problems:   Acute encephalopathy   HLD (hyperlipidemia)   HYPERTENSION, BENIGN   Atrial fibrillation (HCC)   History of stroke   Depression   Pressure ulcer   Discharge Condition: Stable and improved  Diet recommendation: Regular  Filed Weights   05/30/15 2248 05/31/15 0003  Weight: 92.08 kg (203 lb) 89.4 kg (197 lb 1.5 oz)    History of present illness:  Per Dr Bufford Spikes is a 82 y.o. male with PMH of indwelling Foley catheter, recurrent UTI, history of positive urine culture for enterococcus, hypertension, hyperlipidemia, dementia, depression, atrial fibrillation not on anticoagulant possibly due to old age and high risk of fall, bladder cancer (s/P surgery and chemotherapy), lung cancer (s/P of partial lung removed), who presented with altered mental status, generalized weakness and hallucination.  Per EMS report, pt has increased weakness, hallucinations and increased confusion. Patient has history of dementia. Patient has an indwelling catheter to a leg bag. Patient's family also reported frequent UTI history. Patient has orange -colored and cloudy urine. When I saw pt in ED, he is confused, but denied symptoms of UTI, nausea, vomiting, abdominal pain or diarrhea. No chest pain, cough or shortness of breath. No fever or chills.  In ED, patient was found to have positive urinalysis with large amount of leukocyte, positive nitrite, lactate 1.07, WBC 14.8, temperature 99.3, no tachycardia, electrolytes and renal function okay. Patient is admitted to inpatient for further evaluation  treatment.   Hospital Course:  #1 recurrent urinary tract infection secondary to Pseudomonas aeruginosa Patient was admitted with acute encephalopathy with increased weakness and noted to have UTI.Patient was admitted and pan cultured. Patient noted to have a prior history positive for enterococcus UTI which was sensitive to vancomycin 11/03/2014.  Patient was placed empirically on IV Vancomycin and IV Rocephin, IVF and monitored. Patient improved clinically. Patient remained afebrile. Urine cultures with Pseudomonas aeruginosa. IV vancomycin was subsequently discontinued and IV antibiotics narrowed to IV cefepime. Patient's mental status improved and was back to baseline by day of discharge. Urine sensitivities came back and patient was transitioned to oral Levaquin and was discharged home on 7 more days of oral Levaquin to complete a ten-day course of antibiotic treatment. Patient is to follow-up with his urologist in the outpatient setting.  #2 acute encephalopathy secondary to pseudomonas aeruginosa urinary tract infection See problem #1.  #3 hyperlipidemia Continued on Lipitor.  #4 hypertension Stable. Continued on Toprol-XL.  #5 depression Stable. No suicidal or homicidal ideations. Continued on Wellbutrin and Zoloft.  #6 chronic atrial fibrillation CHA2DS2VASC 6 Currently rate controlled on Toprol-XL. Patient not on anticoagulation likely secondary to dementia, old age, increased risk of falls. Continued on aspirin.  #7 history of CVA Continued on aspirin for secondary stroke prevention. Continued on Lipitor. Risk factor modification.  Procedures:  Chest x-ray 05/30/2015    Consultations:  None  Discharge Exam: Filed Vitals:   06/02/15 0536 06/02/15 1023  BP: 167/85 150/91  Pulse: 74   Temp: 98.1 F (36.7 C)   Resp: 18     General: NAD Cardiovascular: RRR Respiratory: CTAB  Discharge Instructions   Discharge Instructions  Diet general    Complete by:  As  directed      Discharge instructions    Complete by:  As directed   F/U with Dr Diona Fanti in 1-2 weeks.     Increase activity slowly    Complete by:  As directed           Current Discharge Medication List    START taking these medications   Details  levofloxacin (LEVAQUIN) 250 MG tablet Take 1 tablet (250 mg total) by mouth daily. Take for 7 more days then stop. Qty: 7 tablet, Refills: 0    saccharomyces boulardii (FLORASTOR) 250 MG capsule Take 1 capsule (250 mg total) by mouth 2 (two) times daily.      CONTINUE these medications which have CHANGED   Details  nitrofurantoin (MACRODANTIN) 100 MG capsule Take 1 capsule (100 mg total) by mouth every evening. Resume in 8 days after antibiotics are done. Refills: 0      CONTINUE these medications which have NOT CHANGED   Details  ALPHA LIPOIC ACID PO Take 200-300 mg by mouth every morning.     ascorbic acid (VITAMIN C) 1000 MG tablet Take 1,000 mg by mouth every morning.     aspirin 81 MG chewable tablet Chew 81 mg by mouth every morning.     atorvastatin (LIPITOR) 10 MG tablet Take 10 mg by mouth at bedtime.     buPROPion (WELLBUTRIN) 75 MG tablet Take 75 mg by mouth every morning. daily    calcium carbonate (OS-CAL - DOSED IN MG OF ELEMENTAL CALCIUM) 1250 MG tablet Take 1 tablet by mouth daily with breakfast.     cholecalciferol (VITAMIN D) 1000 UNITS tablet Take 1,000 Units by mouth every morning.     Cyanocobalamin (VITAMIN B 12 PO) Take 500 mg by mouth every morning.     hydrocortisone valerate cream (WESTCORT) 0.2 % Apply 1 application topically 2 (two) times daily as needed (itching).     ibuprofen (ADVIL,MOTRIN) 400 MG tablet Take 1 tablet (400 mg total) by mouth every 6 (six) hours as needed. Take 3 times daily for 3 days, then use every 6 hours as needed. Qty: 30 tablet, Refills: 0    MELATONIN PO Take 1 tablet by mouth at bedtime as needed (sleep).    methenamine (HIPREX) 1 G tablet Take 1 g by mouth 2 (two)  times daily.    metoprolol succinate (TOPROL-XL) 25 MG 24 hr tablet Take 25 mg by mouth daily.    Probiotic Product (PROBIOTIC DAILY PO) Take 460 mg by mouth every morning.     sertraline (ZOLOFT) 100 MG tablet Take 100 mg by mouth daily.      STOP taking these medications     Vancomycin (VANCOCIN) 750 MG/150ML SOLN        Allergies  Allergen Reactions  . Sulfa Antibiotics Nausea And Vomiting  . Sulfonamide Derivatives Nausea And Vomiting   Follow-up Information    Follow up with Jorja Loa, MD. Schedule an appointment as soon as possible for a visit in 1 week.   Specialty:  Urology   Why:  f/u in 1-2 weeks   Contact information:   [place] [address] [phone]        The results of significant diagnostics from this hospitalization (including imaging, microbiology, ancillary and laboratory) are listed below for reference.    Significant Diagnostic Studies: Dg Chest 2 View  05/30/2015  CLINICAL DATA:  Lightheadedness and sickness today. Weakness.  History of dementia, lung cancer, bladder cancer. Ex-smoker. EXAM: CHEST  2 VIEW COMPARISON:  Chest x-rays dated 10/27/2014, 08/19/2014 05/21/2014. FINDINGS: Study is hypoinspiratory with crowding of the perihilar bronchovascular markings. Given the low lung volumes, lungs appear stable compared to previous exams. Suspect at least some degree of chronic interstitial lung disease. No evidence of pneumonia. No pleural effusion or pneumothorax seen. Cardiomegaly appears stable given the low lung volumes. Atherosclerotic changes again noted along the walls of the thoracic aorta. Degenerative spurring is again seen throughout the kyphotic thoracic spine. There is an old stable compression fracture deformity within the lower thoracic spine. No evidence of acute osseous abnormality. IMPRESSION: 1. Low lung volumes. No evidence of acute cardiopulmonary abnormality. No evidence of pneumonia. 2. Cardiomegaly appears  stable given the low lung volumes. No evidence of congestive heart failure. Electronically Signed   By: Franki Cabot M.D.   On: 05/30/2015 18:56    Microbiology: Recent Results (from the past 240 hour(s))  Culture, blood (Routine X 2) w Reflex to ID Panel     Status: None (Preliminary result)   Collection Time: 05/30/15  6:31 PM  Result Value Ref Range Status   Specimen Description BLOOD LEFT ANTECUBITAL  Final   Special Requests BOTTLES DRAWN AEROBIC AND ANAEROBIC 5CC  Final   Culture   Final    NO GROWTH 1 DAY Performed at [facility]    Report Status PENDING  Incomplete  Culture, blood (Routine X 2) w Reflex to ID Panel     Status: None (Preliminary result)   Collection Time: 05/30/15  6:32 PM  Result Value Ref Range Status   Specimen Description BLOOD RIGHT HAND  Final   Special Requests BOTTLES DRAWN AEROBIC ONLY 5CC  Final   Culture   Final    NO GROWTH 1 DAY Performed at [facility]    Report Status PENDING  Incomplete  Urine culture     Status: Abnormal   Collection Time: 05/30/15  8:21 PM  Result Value Ref Range Status   Specimen Description URINE, RANDOM  Final   Special Requests NONE  Final   Culture >=100,000 COLONIES/mL PSEUDOMONAS AERUGINOSA (A)  Final   Report Status 06/02/2015 FINAL  Final   Organism ID, Bacteria PSEUDOMONAS AERUGINOSA (A)  Final      Susceptibility   Pseudomonas aeruginosa - MIC*    CEFTAZIDIME 4 SENSITIVE Sensitive     CIPROFLOXACIN <=0.25 SENSITIVE Sensitive     GENTAMICIN 4 SENSITIVE Sensitive     IMIPENEM 2 SENSITIVE Sensitive     PIP/TAZO 16 SENSITIVE Sensitive     CEFEPIME 4 SENSITIVE Sensitive     * >=100,000 COLONIES/mL PSEUDOMONAS AERUGINOSA     Labs: Basic Metabolic Panel:  Recent Labs Lab 05/30/15 1832 05/31/15 0442 06/01/15 0426 06/02/15 0430  NA 142 141 142 143  K 4.2 3.7 3.5 3.7  CL 107 107 110 112*  CO2 '25 23 22 22  '$ GLUCOSE 116* 110* 99 94  BUN '19 15 15 15  '$ CREATININE 1.15 1.02 0.94 1.02   CALCIUM 9.8 8.6* 8.0* 8.1*   Liver Function Tests:  Recent Labs Lab 05/31/15 0442  AST 15  ALT 12*  ALKPHOS 50  BILITOT 1.3*  PROT 6.7  ALBUMIN 3.7   No results for input(s): LIPASE, AMYLASE in the last 168 hours. No results for input(s): AMMONIA in the last 168 hours. CBC:  Recent Labs Lab 05/30/15 1832 05/31/15 0442 06/01/15 0426 06/02/15 0430  WBC 14.8* 11.8* 11.8*  11.2*  NEUTROABS 12.0*  --   --   --   HGB 15.5 13.9 13.7 13.4  HCT 47.6 43.9 41.8 40.4  MCV 91.2 91.5 88.7 88.4  PLT 207 182 176 188   Cardiac Enzymes: No results for input(s): CKTOTAL, CKMB, CKMBINDEX, TROPONINI in the last 168 hours. BNP: BNP (last 3 results)  Recent Labs  08/19/14 2032  BNP 107.6*    ProBNP (last 3 results) No results for input(s): PROBNP in the last 8760 hours.  CBG: No results for input(s): GLUCAP in the last 168 hours.     SignedIrine Seal MD.  [facility] 06/02/2015, 12:40 PM

## 2015-06-02 NOTE — Care Management Important Message (Signed)
Important Message  Patient Details  Name: [name] MRN: [id number] Date of Birth: 03-22-1930   Medicare Important Message Given:  Yes    Camillo Flaming 06/02/2015, 10:30 AMImportant Message  Patient Details  Name: [name] MRN: [id number] Date of Birth: March 12, 1930   Medicare Important Message Given:  Yes    Camillo Flaming 06/02/2015, 10:30 AM

## 2015-06-02 NOTE — Progress Notes (Addendum)
Pt wife and caregiver arrived to bring patient home.  NT and RN helped transfer patient into the wheelchair with a very strong two person assist.  Nurse verbalized to wife her concerns about patient going home with only one person who is able to transfer him to the wheelchair/bed, etc.  Granddaughter made aware.  Decision was made by family for patient to go to skilled facility instead.  Social worker and MD made aware.

## 2015-06-03 DIAGNOSIS — E785 Hyperlipidemia, unspecified: Secondary | ICD-10-CM | POA: Diagnosis not present

## 2015-06-03 DIAGNOSIS — F329 Major depressive disorder, single episode, unspecified: Secondary | ICD-10-CM | POA: Diagnosis not present

## 2015-06-03 DIAGNOSIS — G9340 Encephalopathy, unspecified: Secondary | ICD-10-CM

## 2015-06-03 DIAGNOSIS — M6281 Muscle weakness (generalized): Secondary | ICD-10-CM | POA: Diagnosis not present

## 2015-06-03 DIAGNOSIS — C679 Malignant neoplasm of bladder, unspecified: Secondary | ICD-10-CM | POA: Diagnosis not present

## 2015-06-03 DIAGNOSIS — N390 Urinary tract infection, site not specified: Secondary | ICD-10-CM

## 2015-06-03 DIAGNOSIS — R2681 Unsteadiness on feet: Secondary | ICD-10-CM | POA: Diagnosis not present

## 2015-06-03 DIAGNOSIS — R5381 Other malaise: Secondary | ICD-10-CM | POA: Diagnosis not present

## 2015-06-03 DIAGNOSIS — I10 Essential (primary) hypertension: Secondary | ICD-10-CM

## 2015-06-03 DIAGNOSIS — I482 Chronic atrial fibrillation: Secondary | ICD-10-CM | POA: Diagnosis not present

## 2015-06-03 DIAGNOSIS — I481 Persistent atrial fibrillation: Secondary | ICD-10-CM | POA: Diagnosis not present

## 2015-06-03 DIAGNOSIS — D72829 Elevated white blood cell count, unspecified: Secondary | ICD-10-CM | POA: Diagnosis not present

## 2015-06-03 DIAGNOSIS — N3080 Other cystitis without hematuria: Secondary | ICD-10-CM | POA: Diagnosis not present

## 2015-06-03 DIAGNOSIS — N261 Atrophy of kidney (terminal): Secondary | ICD-10-CM | POA: Diagnosis not present

## 2015-06-03 DIAGNOSIS — B965 Pseudomonas (aeruginosa) (mallei) (pseudomallei) as the cause of diseases classified elsewhere: Secondary | ICD-10-CM | POA: Diagnosis not present

## 2015-06-03 DIAGNOSIS — R278 Other lack of coordination: Secondary | ICD-10-CM | POA: Diagnosis not present

## 2015-06-03 DIAGNOSIS — Z8551 Personal history of malignant neoplasm of bladder: Secondary | ICD-10-CM | POA: Diagnosis not present

## 2015-06-03 MED ORDER — CIPROFLOXACIN HCL 250 MG PO TABS: 250.0000 mg | ORAL_TABLET | Freq: Two times a day (BID) | ORAL | Status: DC

## 2015-06-03 MED ORDER — NITROFURANTOIN MACROCRYSTAL 100 MG PO CAPS: 100.0000 mg | ORAL_CAPSULE | Freq: Every evening | ORAL | Status: DC

## 2015-06-03 NOTE — Discharge Summary (Addendum)
Physician Discharge Summary  [name] OAC:[id number] DOB: 09-27-1930 DOA: 05/30/2015  PCP: [name], MD  Admit date: 05/30/2015 Discharge date: 06/03/2015  Recommendations for Outpatient Follow-up:  1. Pt will need to follow up with PCP in 2 weeks post discharge 2. Follow up with urology, Dr. Diona Fanti in 1-2 weeks  Discharge Diagnoses:   #1 recurrent urinary tract infection secondary to Pseudomonas aeruginosa (due to indwelling foley) Patient was admitted with acute encephalopathy with increased weakness and noted to have UTI.  -Patient was admitted and pan cultured.  -Patient noted to have a prior history positive for enterococcus UTI which was sensitive to vancomycin 11/03/2014.  -initially placed empirically on IV Vancomycin and IV Rocephin, IVF and monitored.  -Patient improved clinically. Patient remained afebrile.  -Urine cultures with Pseudomonas aeruginosa-pansensitive - IV vancomycin was subsequently discontinued and IV antibiotics narrowed to IV cefepime.  -Patient's mental status improved and was back to baseline by day of discharge.  -Urine sensitivities came back and patient was transitioned to oral Levaquin and was discharged to SNF with ciprofloxacin for 5 more days to complete 7 days of therapy -Patient is to follow-up with his urologist in the outpatient setting. -Patient will restart his previous regimen of suppression with hiprex and nitrofurantoin after ciprofloxacin is finished  #2 acute encephalopathy secondary to pseudomonas aeruginosa urinary tract infection See problem #1. -Improved, back to baseline  #3 hyperlipidemia Continued on Lipitor.  #4 hypertension Stable. Continued on Toprol-XL.  #5 depression Stable. No suicidal or homicidal ideations. Continued on Wellbutrin and Zoloft.  #6 chronic atrial fibrillation CHA2DS2VASC 6 Currently rate controlled on Toprol-XL. Patient not on anticoagulation likely secondary to dementia, old age,  increased risk of falls. Continued on aspirin 81 mg daily  #7 history of CVA Continued on aspirin for secondary stroke prevention. Continued on Lipitor. Risk factor modification.  Discharge Condition: stable  Disposition: SNF Follow-up Information    Follow up with Jorja Loa, MD. Schedule an appointment as soon as possible for a visit in 1 week.   Specialty:  Urology   Why:  f/u in 1-2 weeks   Contact information:   [place] [address] [phone]       Diet: heart healthy Wt Readings from Last 3 Encounters:  05/31/15 89.4 kg (197 lb 1.5 oz)  05/11/15 92.08 kg (203 lb)  10/24/14 92.987 kg (205 lb)    History of present illness:  [name] is a 82 y.o. male with PMH of indwelling Foley catheter, recurrent UTI, history of positive urine culture for enterococcus, hypertension, hyperlipidemia, dementia, depression, atrial fibrillation not on anticoagulant possibly due to old age and high risk of fall, bladder cancer (s/P surgery and chemotherapy), lung cancer (s/P of partial lung removed), who presented with altered mental status, generalized weakness and hallucination.  Per EMS report, pt has increased weakness, hallucinations and increased confusion. Patient has history of dementia. Patient has an indwelling catheter to a leg bag. Patient's family also reported frequent UTI history. Patient has orange -colored and cloudy urine. When I saw pt in ED, he is confused, but denied symptoms of UTI, nausea, vomiting, abdominal pain or diarrhea. No chest pain, cough or shortness of breath. No fever or chills.  In ED, patient was found to have positive urinalysis with large amount of leukocyte, positive nitrite, lactate 1.07, WBC 14.8, temperature 99.3, no tachycardia, electrolytes and renal function okay. Patient is admitted to inpatient for further evaluation treatment.  Discharge Exam: Filed Vitals:  06/02/15 2019 06/03/15 0646  BP: 141/69 154/78    Pulse: 89 97  Temp: 98.3 F (36.8 C) 97.7 F (36.5 C)  Resp: 22 20   Filed Vitals:   06/02/15 1358 06/02/15 2019 06/02/15 2055 06/03/15 0646  BP: 125/75 141/69  154/78  Pulse: 55 89  97  Temp: 98.2 F (36.8 C) 98.3 F (36.8 C)  97.7 F (36.5 C)  TempSrc: Oral Oral  Oral  Resp: '16 22  20  '$ Height:      Weight:      SpO2: 92% 96% 95% 94%   General: Awake and alert, NAD, pleasant, cooperative Cardiovascular: IRRR, no rub, no gallop, no S3 Respiratory: Right basilar crackles, no wheeze Abdomen:soft, nontender, nondistended, positive bowel sounds Extremities: No edema, No lymphangitis, no petechiae  Discharge Instructions  Discharge Instructions    Diet - low sodium heart healthy    Complete by:  As directed      Diet general    Complete by:  As directed      Discharge instructions    Complete by:  As directed   F/U with Dr Diona Fanti in 1-2 weeks.     Increase activity slowly    Complete by:  As directed      Increase activity slowly    Complete by:  As directed             Medication List    STOP taking these medications        Vancomycin 750 MG/150ML Soln  Commonly known as:  VANCOCIN      TAKE these medications        ALPHA LIPOIC ACID PO  Take 200-300 mg by mouth every morning.     ascorbic acid 1000 MG tablet  Commonly known as:  VITAMIN C  Take 1,000 mg by mouth every morning.     aspirin 81 MG chewable tablet  Chew 81 mg by mouth every morning.     atorvastatin 10 MG tablet  Commonly known as:  LIPITOR  Take 10 mg by mouth at bedtime.     buPROPion 75 MG tablet  Commonly known as:  WELLBUTRIN  Take 75 mg by mouth every morning. daily     calcium carbonate 1250 (500 Ca) MG tablet  Commonly known as:  OS-CAL - dosed in mg of elemental calcium  Take 1 tablet by mouth daily with breakfast.     cholecalciferol 1000 units tablet  Commonly known as:  VITAMIN D  Take 1,000 Units by mouth every morning.     ciprofloxacin 250 MG tablet   Commonly known as:  CIPRO  Take 1 tablet (250 mg total) by mouth 2 (two) times daily.  Start taking on:  06/04/2015     hydrocortisone valerate cream 0.2 %  Commonly known as:  WESTCORT  Apply 1 application topically 2 (two) times daily as needed (itching).     ibuprofen 400 MG tablet  Commonly known as:  ADVIL,MOTRIN  Take 1 tablet (400 mg total) by mouth every 6 (six) hours as needed. Take 3 times daily for 3 days, then use every 6 hours as needed.     MELATONIN PO  Take 1 tablet by mouth at bedtime as needed (sleep).     methenamine 1 g tablet  Commonly known as:  HIPREX  Take 1 g by mouth 2 (two) times daily.     metoprolol succinate 25 MG 24 hr tablet  Commonly known as:  TOPROL-XL  Take 25  mg by mouth daily.     nitrofurantoin 100 MG capsule  Commonly known as:  MACRODANTIN  Take 1 capsule (100 mg total) by mouth every evening. Resume 4/18 after antibiotics are done.  Start taking on:  06/09/2015     PROBIOTIC DAILY PO  Take 460 mg by mouth every morning.     saccharomyces boulardii 250 MG capsule  Commonly known as:  FLORASTOR  Take 1 capsule (250 mg total) by mouth 2 (two) times daily.     sertraline 100 MG tablet  Commonly known as:  ZOLOFT  Take 100 mg by mouth daily.     VITAMIN B 12 PO  Take 500 mg by mouth every morning.         The results of significant diagnostics from this hospitalization (including imaging, microbiology, ancillary and laboratory) are listed below for reference.    Significant Diagnostic Studies: Dg Chest 2 View  05/30/2015  CLINICAL DATA:  Lightheadedness and sickness today. Weakness. History of dementia, lung cancer, bladder cancer. Ex-smoker. EXAM: CHEST  2 VIEW COMPARISON:  Chest x-rays dated 10/27/2014, 08/19/2014 05/21/2014. FINDINGS: Study is hypoinspiratory with crowding of the perihilar bronchovascular markings. Given the low lung volumes, lungs appear stable compared to previous exams. Suspect at least some degree of chronic  interstitial lung disease. No evidence of pneumonia. No pleural effusion or pneumothorax seen. Cardiomegaly appears stable given the low lung volumes. Atherosclerotic changes again noted along the walls of the thoracic aorta. Degenerative spurring is again seen throughout the kyphotic thoracic spine. There is an old stable compression fracture deformity within the lower thoracic spine. No evidence of acute osseous abnormality. IMPRESSION: 1. Low lung volumes. No evidence of acute cardiopulmonary abnormality. No evidence of pneumonia. 2. Cardiomegaly appears stable given the low lung volumes. No evidence of congestive heart failure. Electronically Signed   By: Franki Cabot M.D.   On: 05/30/2015 18:56     Microbiology: Recent Results (from the past 240 hour(s))  Culture, blood (Routine X 2) w Reflex to ID Panel     Status: None (Preliminary result)   Collection Time: 05/30/15  6:31 PM  Result Value Ref Range Status   Specimen Description BLOOD LEFT ANTECUBITAL  Final   Special Requests BOTTLES DRAWN AEROBIC AND ANAEROBIC 5CC  Final   Culture   Final    NO GROWTH 2 DAYS Performed at [facility]    Report Status PENDING  Incomplete  Culture, blood (Routine X 2) w Reflex to ID Panel     Status: None (Preliminary result)   Collection Time: 05/30/15  6:32 PM  Result Value Ref Range Status   Specimen Description BLOOD RIGHT HAND  Final   Special Requests BOTTLES DRAWN AEROBIC ONLY 5CC  Final   Culture   Final    NO GROWTH 2 DAYS Performed at [facility]    Report Status PENDING  Incomplete  Urine culture     Status: Abnormal   Collection Time: 05/30/15  8:21 PM  Result Value Ref Range Status   Specimen Description URINE, RANDOM  Final   Special Requests NONE  Final   Culture >=100,000 COLONIES/mL PSEUDOMONAS AERUGINOSA (A)  Final   Report Status 06/02/2015 FINAL  Final   Organism ID, Bacteria PSEUDOMONAS AERUGINOSA (A)  Final      Susceptibility   Pseudomonas aeruginosa -  MIC*    CEFTAZIDIME 4 SENSITIVE Sensitive     CIPROFLOXACIN <=0.25 SENSITIVE Sensitive     GENTAMICIN 4 SENSITIVE Sensitive  IMIPENEM 2 SENSITIVE Sensitive     PIP/TAZO 16 SENSITIVE Sensitive     CEFEPIME 4 SENSITIVE Sensitive     * >=100,000 COLONIES/mL PSEUDOMONAS AERUGINOSA     Labs: Basic Metabolic Panel:  Recent Labs Lab 05/30/15 1832 05/31/15 0442 06/01/15 0426 06/02/15 0430  NA 142 141 142 143  K 4.2 3.7 3.5 3.7  CL 107 107 110 112*  CO2 '25 23 22 22  '$ GLUCOSE 116* 110* 99 94  BUN '19 15 15 15  '$ CREATININE 1.15 1.02 0.94 1.02  CALCIUM 9.8 8.6* 8.0* 8.1*   Liver Function Tests:  Recent Labs Lab 05/31/15 0442  AST 15  ALT 12*  ALKPHOS 50  BILITOT 1.3*  PROT 6.7  ALBUMIN 3.7   No results for input(s): LIPASE, AMYLASE in the last 168 hours. No results for input(s): AMMONIA in the last 168 hours. CBC:  Recent Labs Lab 05/30/15 1832 05/31/15 0442 06/01/15 0426 06/02/15 0430  WBC 14.8* 11.8* 11.8* 11.2*  NEUTROABS 12.0*  --   --   --   HGB 15.5 13.9 13.7 13.4  HCT 47.6 43.9 41.8 40.4  MCV 91.2 91.5 88.7 88.4  PLT 207 182 176 188   Cardiac Enzymes: No results for input(s): CKTOTAL, CKMB, CKMBINDEX, TROPONINI in the last 168 hours. BNP: Invalid input(s): POCBNP CBG: No results for input(s): GLUCAP in the last 168 hours.  Time coordinating discharge:  Greater than 30 minutes  Signed:  Denny Mccree, DO [facility] Pager: [phone] 06/03/2015, 8:56 AM

## 2015-06-03 NOTE — Progress Notes (Signed)
Pt for discharge to [facility].   CSW facilitated pt discharge needs including contacting facility, sending pt discharge information via epic hub, discussing with pt at bedside and pt granddaughter, [name], providing RN phone number to call report, and arranging ambulance transport for pt to [facility].   Pt family appreciative of assistance with arranging SNF.   No further social work needs identified at this time.  CSW signing off.   Alison Murray, MSW, [place] Work [phone]

## 2015-06-03 NOTE — Progress Notes (Signed)
Report called to [place] at [facility] [phone]), pt to go to [place] upon arrival

## 2015-06-03 NOTE — Clinical Social Work Placement (Signed)
   CLINICAL SOCIAL WORK PLACEMENT  NOTE  Date:  06/03/2015  Patient Details  Name: [name] MRN: [id number] Date of Birth: 1930-11-08  Clinical Social Work is seeking post-discharge placement for this patient at the [place] level of care (*CSW will initial, date and re-position this form in  chart as items are completed):  Yes   Patient/family provided with [place] Work Department's list of facilities offering this level of care within the geographic area requested by the patient (or if unable, by the patient's family).  Yes   Patient/family informed of their freedom to choose among providers that offer the needed level of care, that participate in Medicare, Medicaid or managed care program needed by the patient, have an available bed and are willing to accept the patient.  Yes   Patient/family informed of [facility]'s ownership interest in [facility] and [facility], as well as of the fact that they are under no obligation to receive care at these facilities.  PASRR submitted to EDS on       PASRR number received on       Existing PASRR number confirmed on 06/02/15     FL2 transmitted to all facilities in geographic area requested by pt/family on 06/02/15     FL2 transmitted to all facilities within larger geographic area on       Patient informed that his/her managed care company has contracts with or will negotiate with certain facilities, including the following:        Yes   Patient/family informed of bed offers received.  Patient chooses bed at [facility]     Physician recommends and patient chooses bed at      Patient to be transferred to [facility] on 06/03/15.  Patient to be transferred to facility by ambulance [facility])     Patient family notified on 06/03/15 of transfer.  Name of family member notified:  pt notified at bedside and pt granddaughter, [name] notified via telephone     PHYSICIAN Please  sign FL2     Additional Comment:    _______________________________________________ Ladell Pier, LCSW 06/03/2015, 10:51 AM

## 2015-06-03 NOTE — Progress Notes (Signed)
Transported to SNF per EMS via stretcher

## 2015-06-03 NOTE — Progress Notes (Signed)
CSW continuing to follow.   CSW spoke with pt granddaughter, [name] via telephone to provide SNF bed offers. Pt granddaughter chooses [facility].  CSW confirmed with [facility] that facility could accept pt today.   CSW to facilitate pt discharge needs to [facility] today.  Alison Murray, MSW, [place] Work [phone]

## 2015-06-04 ENCOUNTER — Non-Acute Institutional Stay (SKILLED_NURSING_FACILITY): Payer: Medicare Other | Admitting: Internal Medicine

## 2015-06-04 ENCOUNTER — Encounter: Payer: Self-pay | Admitting: Internal Medicine

## 2015-06-04 DIAGNOSIS — I481 Persistent atrial fibrillation: Secondary | ICD-10-CM | POA: Diagnosis not present

## 2015-06-04 DIAGNOSIS — F329 Major depressive disorder, single episode, unspecified: Secondary | ICD-10-CM

## 2015-06-04 DIAGNOSIS — D72829 Elevated white blood cell count, unspecified: Secondary | ICD-10-CM | POA: Diagnosis not present

## 2015-06-04 DIAGNOSIS — I4819 Other persistent atrial fibrillation: Secondary | ICD-10-CM

## 2015-06-04 DIAGNOSIS — E785 Hyperlipidemia, unspecified: Secondary | ICD-10-CM

## 2015-06-04 DIAGNOSIS — F32A Depression, unspecified: Secondary | ICD-10-CM

## 2015-06-04 DIAGNOSIS — C679 Malignant neoplasm of bladder, unspecified: Secondary | ICD-10-CM | POA: Diagnosis not present

## 2015-06-04 DIAGNOSIS — N3080 Other cystitis without hematuria: Secondary | ICD-10-CM

## 2015-06-04 DIAGNOSIS — B965 Pseudomonas (aeruginosa) (mallei) (pseudomallei) as the cause of diseases classified elsewhere: Secondary | ICD-10-CM | POA: Diagnosis not present

## 2015-06-04 DIAGNOSIS — I10 Essential (primary) hypertension: Secondary | ICD-10-CM

## 2015-06-04 DIAGNOSIS — R5381 Other malaise: Secondary | ICD-10-CM | POA: Diagnosis not present

## 2015-06-04 NOTE — Progress Notes (Signed)
LOCATION: [name]  PCP: [name], MD   Code Status: DNR  Goals of care: Advanced Directive information Advanced Directives 06/04/2015  Does patient have an advance directive? Yes  Type of Advance Directive Out of facility DNR (pink MOST or yellow form)  Does patient want to make changes to advanced directive? No - Patient declined  Copy of advanced directive(s) in chart? Yes  Would patient like information on creating an advanced directive? -       Extended Emergency Contact Information Primary Emergency Contact: Profitt,Lindsay Address: [place], [place] [address] [place] of [place] Phone: [phone] Relation: Grandaughter Secondary Emergency Contact: Crawford Givens [place] Phone: [phone] Mobile Phone: [phone] Relation: Spouse   Allergies  Allergen Reactions  . Sulfa Antibiotics Nausea And Vomiting  . Sulfonamide Derivatives Nausea And Vomiting    Chief Complaint  Patient presents with  . New Admit To SNF    New Admission     HPI:  Patient is a 82 y.o. male seen today for short term rehabilitation post hospital admission from 05/30/15-06/03/15 with increased weakness, confusion and was diagnosed with pseudomonas UTI. He was started on antibiotics. He has PMH of HLD, HTN, depression, chronic afib, CVA among others. He is seen in his room today.   Review of Systems:  Constitutional: Negative for fever, chills, diaphoresis. Energy is slowly coming back.  HENT: Negative for headache, congestion, nasal discharge, hearing loss, sore throat, difficulty swallowing.   Eyes: Negative for blurred vision, double vision and discharge.  Respiratory: Negative for cough, shortness of breath and wheezing.   Cardiovascular: Negative for chest pain, palpitations, leg swelling.  Gastrointestinal: Negative for heartburn, nausea, vomiting, abdominal pain, loss of appetite. Last bowel movement was today.    Genitourinary: Negative for dysuria and flank pain.  Musculoskeletal: Negative for back pain, fall in the facility.  Skin: Negative for itching, rash.  Neurological: Negative for dizziness. Psychiatric/Behavioral: Negative for depression.    Past Medical History  Diagnosis Date  . Atrial fibrillation ([place])   . Hyperlipidemia   . Hypertension   . UTI (urinary tract infection)   . Dementia   . Bladder cancer ([facility])     hx of surgery and chemo  . Lung cancer [facility])     Status post surgery resection  . Diverticulitis   . Depression 08/19/2014   Past Surgical History  Procedure Laterality Date  . Hernia repair  2006  . Cataract extraction, bilateral  1998, 2000  . Cystectomy  2002  . Lung removal, partial     Social History:   reports that he quit smoking about 23 years ago. His smoking use included Cigarettes. He has never used smokeless tobacco. He reports that he does not drink alcohol or use illicit drugs.  Family History  Problem Relation Age of Onset  . Cancer Maternal Aunt     unknown  . Cancer Maternal Grandmother     unknown  . Aneurysm Brother   . Stroke Father     cerebral hemorrhage    Medications:   Medication List       This list is accurate as of: 06/04/15  3:06 PM.  Always use your most recent med list.               ALPHA LIPOIC ACID PO  Take 200-300 mg by mouth every morning.     ascorbic acid 1000 MG tablet  Commonly known as:  VITAMIN C  Take 1,000 mg by mouth every morning.     aspirin 81 MG chewable tablet  Chew 81 mg by mouth every morning.     atorvastatin 10 MG tablet  Commonly known as:  LIPITOR  Take 10 mg by mouth at bedtime.     buPROPion 75 MG tablet  Commonly known as:  WELLBUTRIN  Take 75 mg by mouth every morning. daily     calcium carbonate 1250 (500 Ca) MG tablet  Commonly known as:  OS-CAL - dosed in mg of elemental calcium  Take 1 tablet by mouth daily with breakfast.     cholecalciferol 1000 units tablet   Commonly known as:  VITAMIN D  Take 1,000 Units by mouth every morning.     ciprofloxacin 250 MG tablet  Commonly known as:  CIPRO  Take 1 tablet (250 mg total) by mouth 2 (two) times daily.     hydrocortisone valerate cream 0.2 %  Commonly known as:  WESTCORT  Apply 1 application topically 2 (two) times daily as needed (itching).     ibuprofen 400 MG tablet  Commonly known as:  ADVIL,MOTRIN  Take 400 mg by mouth every 6 (six) hours as needed for moderate pain.     MELATONIN PO  Take 1 tablet by mouth at bedtime as needed (sleep).     methenamine 1 g tablet  Commonly known as:  HIPREX  Take 1 g by mouth 2 (two) times daily.     metoprolol succinate 25 MG 24 hr tablet  Commonly known as:  TOPROL-XL  Take 25 mg by mouth daily.     nitrofurantoin 100 MG capsule  Commonly known as:  MACRODANTIN  Take 1 capsule (100 mg total) by mouth every evening. Resume 4/18 after antibiotics are done.  Start taking on:  06/09/2015     saccharomyces boulardii 250 MG capsule  Commonly known as:  FLORASTOR  Take 1 capsule (250 mg total) by mouth 2 (two) times daily.     sertraline 100 MG tablet  Commonly known as:  ZOLOFT  Take 100 mg by mouth daily.     VITAMIN B 12 PO  Take 500 mg by mouth every morning.        Immunizations: Immunization History  Administered Date(s) Administered  . Influenza,inj,Quad PF,36+ Mos 10/27/2014  . PPD Test 06/03/2015     Physical Exam: Filed Vitals:   06/04/15 1455  BP: 155/91  Pulse: 70  Temp: 97 F (36.1 C)  TempSrc: Oral  Resp: 20  Height: '6\' 1"'$  (1.854 m)  Weight: 197 lb (89.359 kg)  SpO2: 98%   Body mass index is 26 kg/(m^2).  General- elderly male, well built, in no acute distress Head- normocephalic, atraumatic Nose- no maxillary or frontal sinus tenderness, no nasal discharge Throat- moist mucus membrane  Eyes- PERRLA, EOMI, no pallor, no icterus, no discharge, normal conjunctiva, normal sclera Neck- no cervical  lymphadenopathy Cardiovascular- normal s1,s2, no murmur,  leg edema Respiratory- bilateral clear to auscultation, no wheeze, no rhonchi, no crackles, no use of accessory muscles Abdomen- bowel sounds present, soft, non tender, foley catheter + Musculoskeletal- able to move all 4 extremities, generalized weakness Neurological- alert and oriented to person and place Skin- warm and dry Psychiatry- normal mood and affect    Labs reviewed: Basic Metabolic Panel:  Recent Labs  05/31/15 0442 06/01/15 0426 06/02/15 06/02/15 0430  NA 141 142 143 143  K 3.7 3.5  --  3.7  CL 107 110  --  112*  CO2 23 22  --  22  GLUCOSE 110* 99  --  94  BUN '15 15 15 15  '$ CREATININE 1.02 0.94 1.0 1.02  CALCIUM 8.6* 8.0*  --  8.1*   Liver Function Tests:  Recent Labs  08/19/14 2030 11/07/14 1408 05/31/15 0442  AST 32 24 15  ALT 10* 14* 12*  ALKPHOS 54 53 50  BILITOT 1.5* 1.0 1.3*  PROT 7.2 6.9 6.7  ALBUMIN 3.9 3.9 3.7   No results for input(s): LIPASE, AMYLASE in the last 8760 hours. No results for input(s): AMMONIA in the last 8760 hours. CBC:  Recent Labs  10/24/14 1326  11/07/14 1408 05/30/15 1832 05/31/15 0442 06/01/15 0426 06/02/15 06/02/15 0430  WBC 10.6*  < > 11.0* 14.8* 11.8* 11.8* 11.2 11.2*  NEUTROABS 8.2*  --  8.1* 12.0*  --   --   --   --   HGB 14.4  < > 13.9 15.5 13.9 13.7  --  13.4  HCT 46.4  < > 45.0 47.6 43.9 41.8  --  40.4  MCV 93.7  < > 93.4 91.2 91.5 88.7  --  88.4  PLT 212  < > 234 207 182 176  --  188  < > = values in this interval not displayed. Cardiac Enzymes:  Recent Labs  08/19/14 2030  TROPONINI <0.03   BNP: Invalid input(s): POCBNP CBG: No results for input(s): GLUCAP in the last 8760 hours.  Radiological Exams: Dg Chest 2 View  05/30/2015  CLINICAL DATA:  Lightheadedness and sickness today. Weakness. History of dementia, lung cancer, bladder cancer. Ex-smoker. EXAM: CHEST  2 VIEW COMPARISON:  Chest x-rays dated 10/27/2014, 08/19/2014 05/21/2014.  FINDINGS: Study is hypoinspiratory with crowding of the perihilar bronchovascular markings. Given the low lung volumes, lungs appear stable compared to previous exams. Suspect at least some degree of chronic interstitial lung disease. No evidence of pneumonia. No pleural effusion or pneumothorax seen. Cardiomegaly appears stable given the low lung volumes. Atherosclerotic changes again noted along the walls of the thoracic aorta. Degenerative spurring is again seen throughout the kyphotic thoracic spine. There is an old stable compression fracture deformity within the lower thoracic spine. No evidence of acute osseous abnormality. IMPRESSION: 1. Low lung volumes. No evidence of acute cardiopulmonary abnormality. No evidence of pneumonia. 2. Cardiomegaly appears stable given the low lung volumes. No evidence of congestive heart failure. Electronically Signed   By: Franki Cabot M.D.   On: 05/30/2015 18:56    Assessment/Plan  Physical deconditioning Will have him work with physical therapy and occupational therapy team to help with gait training and muscle strengthening exercises.fall precautions. Skin care. Encourage to be out of bed.   pseudomonas UTI Continue ciprofloxacin 250 mg bid until 06/08/15. Encourage hydration. Start macrodantin and hiprex on 06/09/15 after completion of current antibiotics  Leukocytosis Afebrile, monitor cbc  Chronic depression Stable, continue wellbutrin  HLD Continue lipitor  HTN Elevated BP, continue toprol xl 25 mg daily, check bp bid for now  afib Rate controlled. Continue toprol.   Malignant neoplasm of bladder S/p surgery and indwelling foley, continue foley care   Goals of care: short term rehabilitation   Labs/tests ordered: cbc, cmp 06/08/15  Family/ staff Communication: reviewed care plan with patient and nursing supervisor    Blanchie Serve, MD Internal Medicine [facility] [place],  [place] [address] Cell Phone (Monday-Friday 8 am - 5 pm): [phone]  On Call: [phone] and follow prompts after 5 pm and on weekends Office Phone: [phone] Office Fax: [phone]

## 2015-06-05 LAB — CULTURE, BLOOD (ROUTINE X 2)
CULTURE: NO GROWTH
Culture: NO GROWTH

## 2015-06-08 LAB — CBC AND DIFFERENTIAL
HCT: 47 % (ref 41–53)
HEMOGLOBIN: 14.9 g/dL (ref 13.5–17.5)
Platelets: 345 10*3/uL (ref 150–399)
WBC: 11.3 10*3/mL

## 2015-06-08 LAB — HEPATIC FUNCTION PANEL
ALT: 13 U/L (ref 10–40)
AST: 21 U/L (ref 14–40)
Alkaline Phosphatase: 61 U/L (ref 25–125)
Bilirubin, Total: 0.6 mg/dL

## 2015-06-08 LAB — BASIC METABOLIC PANEL
BUN: 17 mg/dL (ref 4–21)
CREATININE: 1.2 mg/dL (ref 0.6–1.3)
GLUCOSE: 140 mg/dL
POTASSIUM: 3.9 mmol/L (ref 3.4–5.3)
SODIUM: 145 mmol/L (ref 137–147)

## 2015-06-09 ENCOUNTER — Non-Acute Institutional Stay (SKILLED_NURSING_FACILITY): Payer: Medicare Other | Admitting: Family

## 2015-06-09 ENCOUNTER — Encounter: Payer: Self-pay | Admitting: Family

## 2015-06-09 DIAGNOSIS — D72829 Elevated white blood cell count, unspecified: Secondary | ICD-10-CM | POA: Diagnosis not present

## 2015-06-09 NOTE — Progress Notes (Signed)
Location:  [place] Room Number: [place] of Service:  SNF [phone]) Provider:  Marlowe Sax,  FNP-C Gilles Chiquito, MD   [name], MD  Patient Care Team: [name], MD as PCP - General (Internal Medicine) [name], MD (Internal Medicine)  Extended Emergency Contact Information Primary Emergency Contact: Profitt,Lindsay Address: [place], [place] [address] Johnnette Litter of [place] Phone: [phone] Relation: Grandaughter Secondary Emergency Contact: [place] of [place] Phone: [phone] Mobile Phone: [phone] Relation: Spouse  Code Status: DNR  Goals of care: Advanced Directive information Advanced Directives 06/09/2015  Does patient have an advance directive? Yes  Type of Advance Directive Out of facility DNR (pink MOST or yellow form)  Does patient want to make changes to advanced directive? No - Patient declined  Copy of advanced directive(s) in chart? Yes     Chief Complaint  Patient presents with  . Acute Visit    Abnormal Labs    HPI:  Pt is a 82 y.o. male seen today at [facility] for an acute visit for evaluation of abnormal lab results. He is post hospital admission from 05/30/15-06/03/15 with increased weakness, confusion and was diagnosed with pseudomonas UTI. He was started on antibiotics.He is seen in his room today. He is currently on last dose of Cipro then his previous regimen of suppression Nitrofurantoin 100 mg Capsule daily.Recent lab results showed WBC 11.3 (06/08/2015). He denies any fever, chills, cough or fatigue. Facility staff reports no new behavioral issues.   Past Medical History  Diagnosis Date  . Atrial fibrillation ([place])   . Hyperlipidemia   . Hypertension   . UTI (urinary tract infection)   . Dementia   . Bladder cancer (H[facility])     hx of surgery and chemo  . Lung cancer [facility])     Status post surgery resection  .  Diverticulitis   . Depression 08/19/2014   Past Surgical History  Procedure Laterality Date  . Hernia repair  2006  . Cataract extraction, bilateral  1998, 2000  . Cystectomy  2002  . Lung removal, partial      Allergies  Allergen Reactions  . Sulfa Antibiotics Nausea And Vomiting  . Sulfonamide Derivatives Nausea And Vomiting      Medication List       This list is accurate as of: 06/09/15 10:23 AM.  Always use your most recent med list.               ALPHA LIPOIC ACID PO  Take 200 mg by mouth every morning.     ascorbic acid 1000 MG tablet  Commonly known as:  VITAMIN C  Take 1,000 mg by mouth every morning.     aspirin 81 MG chewable tablet  Chew 81 mg by mouth every morning.     atorvastatin 10 MG tablet  Commonly known as:  LIPITOR  Take 10 mg by mouth at bedtime.     buPROPion 75 MG tablet  Commonly known as:  WELLBUTRIN  Take 75 mg by mouth every morning. daily     calcium carbonate 1250 (500 Ca) MG tablet  Commonly known as:  OS-CAL - dosed in mg of elemental calcium  Take 1 tablet by mouth daily with breakfast.     cholecalciferol 1000 units tablet  Commonly known as:  VITAMIN D  Take 1,000 Units by mouth every morning.  hydrocortisone valerate cream 0.2 %  Commonly known as:  WESTCORT  Apply 1 application topically 2 (two) times daily as needed (itching).     ibuprofen 400 MG tablet  Commonly known as:  ADVIL,MOTRIN  Take 400 mg by mouth every 6 (six) hours as needed for moderate pain.     MELATONIN PO  Take 3 mg by mouth at bedtime as needed (sleep).     methenamine 1 g tablet  Commonly known as:  HIPREX  Take 1 g by mouth 2 (two) times daily.     metoprolol succinate 25 MG 24 hr tablet  Commonly known as:  TOPROL-XL  Take 25 mg by mouth daily.     nitrofurantoin 100 MG capsule  Commonly known as:  MACRODANTIN  Take 1 capsule (100 mg total) by mouth every evening. Resume 4/18 after antibiotics are done.     saccharomyces  boulardii 250 MG capsule  Commonly known as:  FLORASTOR  Take 1 capsule (250 mg total) by mouth 2 (two) times daily.     sertraline 100 MG tablet  Commonly known as:  ZOLOFT  Take 100 mg by mouth daily.     VITAMIN B 12 PO  Take 500 mg by mouth every morning.        Review of Systems  Constitutional: Negative for fever, chills, activity change, appetite change and fatigue.  HENT: Negative for congestion, rhinorrhea, sinus pressure and sore throat.   Eyes: Negative.   Respiratory: Negative for cough, chest tightness and wheezing.   Cardiovascular: Negative for chest pain, palpitations and leg swelling.  Gastrointestinal: Negative for nausea, abdominal pain, diarrhea, constipation and abdominal distention.  Endocrine: Negative.   Genitourinary: Negative for urgency and flank pain.       Foley Catheter  Skin: Negative.   Psychiatric/Behavioral: Negative for hallucinations, confusion and agitation.    Immunization History  Administered Date(s) Administered  . Influenza,inj,Quad PF,36+ Mos 10/27/2014  . PPD Test 06/03/2015   Pertinent  Health Maintenance Due  Topic Date Due  . PNA vac Low Risk Adult (1 of 2 - PCV13) 09/20/1997  . INFLUENZA VACCINE  09/22/2015   Fall Risk  01/23/2015 11/18/2014 09/23/2014 09/18/2014 06/18/2014  Falls in the past year? Yes Yes Yes Yes (No Data)  Number falls in past yr: 2 or more 2 or more 2 or more 2 or more -  Injury with Fall? Yes Yes Yes Yes -  Risk Factor Category  High Fall Risk - High Fall Risk High Fall Risk -  Risk for fall due to : History of fall(s);Impaired balance/gait;Impaired mobility Impaired balance/gait;History of fall(s);Impaired mobility History of fall(s);Impaired balance/gait History of fall(s);Impaired balance/gait;Impaired mobility -  Follow up Falls evaluation completed;Education provided;Falls prevention discussed Falls prevention discussed Education provided;Falls prevention discussed Education provided -   Functional  Status Survey:    Filed Vitals:   06/09/15 0950  BP: 135/92  Temp: 97.9 F (36.6 C)  Resp: 22  Height: '6\' 1"'$  (1.854 m)  Weight: 197 lb (89.359 kg)   Body mass index is 26 kg/(m^2). Physical Exam  Constitutional: He appears well-developed and well-nourished. No distress.  HENT:  Head: Normocephalic.  Right Ear: External ear normal.  Left Ear: External ear normal.  Mouth/Throat: Oropharynx is clear and moist.  Eyes: Conjunctivae and EOM are normal. Pupils are equal, round, and reactive to light. Right eye exhibits no discharge. Left eye exhibits no discharge. No scleral icterus.  Neck: Normal range of motion. No JVD present.  Cardiovascular: Normal  rate, regular rhythm, normal heart sounds and intact distal pulses.  Exam reveals no gallop and no friction rub.   No murmur heard. Pulmonary/Chest: Effort normal and breath sounds normal. No respiratory distress. He has no wheezes. He has no rales.  Abdominal: Soft. Bowel sounds are normal. He exhibits no distension and no mass. There is no tenderness. There is no rebound and no guarding.  Musculoskeletal: He exhibits no edema or tenderness.  Lymphadenopathy:    He has no cervical adenopathy.  Neurological: He is alert.  Skin: Skin is warm and dry. No rash noted. No erythema. No pallor.  Psychiatric: He has a normal mood and affect.    Labs reviewed:  Recent Labs  05/31/15 0442 06/01/15 0426 06/02/15 06/02/15 0430 06/08/15  NA 141 142 143 143 145  K 3.7 3.5  --  3.7 3.9  CL 107 110  --  112*  --   CO2 23 22  --  22  --   GLUCOSE 110* 99  --  94  --   BUN '15 15 15 15 17  '$ CREATININE 1.02 0.94 1.0 1.02 1.2  CALCIUM 8.6* 8.0*  --  8.1*  --     Recent Labs  08/19/14 2030 11/07/14 1408 05/31/15 0442 06/08/15  AST 32 '24 15 21  '$ ALT 10* 14* 12* 13  ALKPHOS 54 53 50 61  BILITOT 1.5* 1.0 1.3*  --   PROT 7.2 6.9 6.7  --   ALBUMIN 3.9 3.9 3.7  --     Recent Labs  10/24/14 1326  11/07/14 1408 05/30/15 1832  05/31/15 0442 06/01/15 0426 06/02/15 06/02/15 0430 06/08/15  WBC 10.6*  < > 11.0* 14.8* 11.8* 11.8* 11.2 11.2* 11.3  NEUTROABS 8.2*  --  8.1* 12.0*  --   --   --   --   --   HGB 14.4  < > 13.9 15.5 13.9 13.7  --  13.4 14.9  HCT 46.4  < > 45.0 47.6 43.9 41.8  --  40.4 47  MCV 93.7  < > 93.4 91.2 91.5 88.7  --  88.4  --   PLT 212  < > 234 207 182 176  --  188 345  < > = values in this interval not displayed.  No results found for: HGBA1C Lab Results  Component Value Date   CHOL  12/25/2008    163        ATP III CLASSIFICATION:  <200     mg/dL   Desirable  200-239  mg/dL   Borderline High  >=240    mg/dL   High          HDL 42 12/25/2008   LDLCALC  12/25/2008    99        Total Cholesterol/HDL:CHD Risk Coronary Heart Disease Risk Table                     Men   Women  1/2 Average Risk   3.4   3.3  Average Risk       5.0   4.4  2 X Average Risk   9.6   7.1  3 X Average Risk  23.4   11.0        Use the calculated Patient Ratio above and the CHD Risk Table to determine the patient's CHD Risk.        ATP III CLASSIFICATION (LDL):  <100     mg/dL   Optimal  100-129  mg/dL  Near or Above                    Optimal  130-159  mg/dL   Borderline  160-189  mg/dL   High  >190     mg/dL   Very High   TRIG 109 12/25/2008   CHOLHDL 3.9 12/25/2008    Significant Diagnostic Results in last 30 days:  Dg Chest 2 View  05/30/2015  CLINICAL DATA:  Lightheadedness and sickness today. Weakness. History of dementia, lung cancer, bladder cancer. Ex-smoker. EXAM: CHEST  2 VIEW COMPARISON:  Chest x-rays dated 10/27/2014, 08/19/2014 05/21/2014. FINDINGS: Study is hypoinspiratory with crowding of the perihilar bronchovascular markings. Given the low lung volumes, lungs appear stable compared to previous exams. Suspect at least some degree of chronic interstitial lung disease. No evidence of pneumonia. No pleural effusion or pneumothorax seen. Cardiomegaly appears stable given the low lung volumes.  Atherosclerotic changes again noted along the walls of the thoracic aorta. Degenerative spurring is again seen throughout the kyphotic thoracic spine. There is an old stable compression fracture deformity within the lower thoracic spine. No evidence of acute osseous abnormality. IMPRESSION: 1. Low lung volumes. No evidence of acute cardiopulmonary abnormality. No evidence of pneumonia. 2. Cardiomegaly appears stable given the low lung volumes. No evidence of congestive heart failure. Electronically Signed   By: Franki Cabot M.D.   On: 05/30/2015 18:56    Assessment/Plan Leukocytosis  Afebrile. WBC 11.3 (06/08/2015) trending upward. On last dose of Cipro then start on previous regimen of suppression Nitrofurantoin 100 mg Capsule daily.Exam findings negative bilat. Lung fields CTA. Will obtain urine specimen for U/A and C/S.  Abnormal Gait Fall and safety precautions. Continue with Physical therapy.     Family/ staff Communication: Reviewed plan with patient and Nurse supervisor.   Labs/tests ordered: urine specimen for U/A and C/S.CBC 06/15/2015

## 2015-06-15 LAB — CBC AND DIFFERENTIAL
HCT: 46 % (ref 41–53)
Hemoglobin: 14.4 g/dL (ref 13.5–17.5)
PLATELETS: 240 10*3/uL (ref 150–399)
WBC: 11.1 10^3/mL

## 2015-06-16 ENCOUNTER — Encounter: Payer: Self-pay | Admitting: Family

## 2015-06-16 ENCOUNTER — Non-Acute Institutional Stay (SKILLED_NURSING_FACILITY): Payer: Medicare Other | Admitting: Family

## 2015-06-16 DIAGNOSIS — F329 Major depressive disorder, single episode, unspecified: Secondary | ICD-10-CM | POA: Diagnosis not present

## 2015-06-16 DIAGNOSIS — I10 Essential (primary) hypertension: Secondary | ICD-10-CM

## 2015-06-16 DIAGNOSIS — I482 Chronic atrial fibrillation, unspecified: Secondary | ICD-10-CM

## 2015-06-16 DIAGNOSIS — E785 Hyperlipidemia, unspecified: Secondary | ICD-10-CM

## 2015-06-16 DIAGNOSIS — F32A Depression, unspecified: Secondary | ICD-10-CM

## 2015-06-16 DIAGNOSIS — C679 Malignant neoplasm of bladder, unspecified: Secondary | ICD-10-CM

## 2015-06-16 NOTE — Progress Notes (Signed)
Location:  [place] Room Number: [place] of Service:  SNF [phone])  Provider: Marlowe Sax, FNP-C Blanchie Serve, MD  PCP: [name], MD Patient Care Team: [name], MD as PCP - General (Internal Medicine) [name], MD (Internal Medicine)  Extended Emergency Contact Information Primary Emergency Contact: Profitt,Lindsay Address: [place], [place] [address] Johnnette Litter of [place] Phone: [phone] Relation: Grandaughter Secondary Emergency Contact: [place] of [place] Phone: [phone] Mobile Phone: [phone] Relation: Spouse  Code Status: DNR Goals of care:  Advanced Directive information Advanced Directives 06/16/2015  Does patient have an advance directive? Yes  Type of Advance Directive Out of facility DNR (pink MOST or yellow form)  Does patient want to make changes to advanced directive? No - Patient declined  Copy of advanced directive(s) in chart? Yes     Allergies  Allergen Reactions  . Sulfa Antibiotics Nausea And Vomiting  . Sulfonamide Derivatives Nausea And Vomiting    Chief Complaint  Patient presents with  . Discharge Note    Discharged from SNF    HPI:  82 y.o. male seen today at [facility] for discharge home. He was here for short term Rehab post hospital admission from 05/30/15-06/03/15 with increased weakness, confusion and was diagnosed with pseudomonas UTI. He was started on antibiotics. He has PMH of HLD, HTN, depression, chronic afib, CVA among others.He is seen in his room today. He denies any acute issues this visit. He has worked well with PT/OT now stable for discharge home to continue with PT for ROM, Exercise, gait stability and Muscle strengthening. He will also require a Home health Nurse for Foley Catheter care and management. No DME required. Home health services will be arranged by the facility Social worker prior  to discharge home.     Past Medical History  Diagnosis Date  . Atrial fibrillation ([place])   . Hyperlipidemia   . Hypertension   . UTI (urinary tract infection)   . Dementia   . Bladder cancer (H[facility])     hx of surgery and chemo  . Lung cancer [facility])     Status post surgery resection  . Diverticulitis   . Depression 08/19/2014    Past Surgical History  Procedure Laterality Date  . Hernia repair  2006  . Cataract extraction, bilateral  1998, 2000  . Cystectomy  2002  . Lung removal, partial        reports that he quit smoking about 23 years ago. His smoking use included Cigarettes. He has never used smokeless tobacco. He reports that he does not drink alcohol or use illicit drugs. Social History   Social History  . Marital Status: Married    Spouse Name: N/A  . Number of Children: 3  . Years of Education: N/A   Occupational History  . retired    Social History Main Topics  . Smoking status: Former Smoker    Types: Cigarettes    Quit date: 10/10/1991  . Smokeless tobacco: Never Used  . Alcohol Use: No  . Drug Use: No  . Sexual Activity: Not Currently   Other Topics Concern  . Not on file   Social History Narrative   ** Merged History Encounter **       The patient has 3 children. He lives in [place]. 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 He is a remote smoker. He drinks very rare alcohol. He has grandchildren and great-grandchildren in the area.   Functional Status Survey:    Allergies  Allergen Reactions  . Sulfa Antibiotics Nausea And Vomiting  . Sulfonamide Derivatives Nausea And Vomiting    Pertinent  Health Maintenance Due  Topic Date Due  . PNA vac Low Risk Adult (1 of 2 - PCV13) 09/20/1997  . INFLUENZA VACCINE  09/22/2015    Medications:   Medication List       This list is accurate as of: 06/16/15  8:58 AM.  Always use your most recent med list.               ALPHA LIPOIC ACID PO   Take 200 mg by mouth every morning.     ascorbic acid 1000 MG tablet  Commonly known as:  VITAMIN C  Take 1,000 mg by mouth every morning.     aspirin 81 MG chewable tablet  Chew 81 mg by mouth every morning.     atorvastatin 10 MG tablet  Commonly known as:  LIPITOR  Take 10 mg by mouth at bedtime.     buPROPion 75 MG tablet  Commonly known as:  WELLBUTRIN  Take 75 mg by mouth every morning. daily     calcium carbonate 1250 (500 Ca) MG tablet  Commonly known as:  OS-CAL - dosed in mg of elemental calcium  Take 1 tablet by mouth daily with breakfast.     EQL VITAMIN D3 1000 units tablet  Generic drug:  Cholecalciferol  Take 1,000 Units by mouth every morning.     fluconazole 150 MG tablet  Commonly known as:  DIFLUCAN  Take 150 mg by mouth daily.     hydrocortisone valerate cream 0.2 %  Commonly known as:  WESTCORT  Apply 1 application topically 2 (two) times daily as needed (itching).     ibuprofen 400 MG tablet  Commonly known as:  ADVIL,MOTRIN  Take 400 mg by mouth every 6 (six) hours as needed for moderate pain.     MELATONIN PO  Take 3 mg by mouth at bedtime as needed (sleep).     methenamine 1 g tablet  Commonly known as:  HIPREX  Take 1 g by mouth 2 (two) times daily.     metoprolol succinate 25 MG 24 hr tablet  Commonly known as:  TOPROL-XL  Take 25 mg by mouth daily.     nitrofurantoin 100 MG capsule  Commonly known as:  MACRODANTIN  Take 1 capsule (100 mg total) by mouth every evening. Resume 4/18 after antibiotics are done.     saccharomyces boulardii 250 MG capsule  Commonly known as:  FLORASTOR  Take 1 capsule (250 mg total) by mouth 2 (two) times daily.     sertraline 100 MG tablet  Commonly known as:  ZOLOFT  Take 100 mg by mouth daily.     VITAMIN B 12 PO  Take 50 mcg by mouth every morning.        Review of Systems  Constitutional: Negative for fever, chills, activity change, appetite change and fatigue.  HENT: Negative for  congestion, rhinorrhea, sinus pressure, sneezing and sore throat.   Eyes: Negative.   Respiratory: Positive for cough. Negative for apnea, chest tightness, shortness of breath and wheezing.   Gastrointestinal: Negative for nausea, vomiting, abdominal pain, diarrhea, constipation and abdominal distention.  Endocrine: Negative.   Genitourinary: Negative for urgency and flank pain.  Foley Catheter  Musculoskeletal: Positive for gait problem.  Skin: Negative.   Neurological: Negative.   Psychiatric/Behavioral: Negative.     Filed Vitals:   06/16/15 0812  BP: 159/95  Pulse: 74  Temp: 97 F (36.1 C)  Resp: 18  Height: '6\' 1"'$  (1.854 m)  Weight: 201 lb 12.8 oz (91.536 kg)  SpO2: 95%   Body mass index is 26.63 kg/(m^2). Physical Exam  Constitutional: He is oriented to person, place, and time. He appears well-developed and well-nourished. No distress.  HENT:  Head: Normocephalic.  Mouth/Throat: Oropharynx is clear and moist.  Eyes: Conjunctivae and EOM are normal. Pupils are equal, round, and reactive to light. Right eye exhibits no discharge. Left eye exhibits no discharge. No scleral icterus.  Neck: Normal range of motion. No JVD present. No thyromegaly present.  Cardiovascular: Normal rate, regular rhythm, normal heart sounds and intact distal pulses.  Exam reveals no gallop and no friction rub.   No murmur heard. Pulmonary/Chest: Effort normal and breath sounds normal. No respiratory distress. He has no wheezes. He has no rales.  Abdominal: Soft. Bowel sounds are normal. He exhibits no distension and no mass. There is no tenderness. There is no rebound and no guarding.  Genitourinary:  Indwelling Foley catheter draining clear yellow urine.   Musculoskeletal: Normal range of motion. He exhibits no edema or tenderness.  Lymphadenopathy:    He has no cervical adenopathy.  Neurological: He is oriented to person, place, and time.  Skin: Skin is warm and dry. No rash noted. No  erythema. No pallor.  Psychiatric: He has a normal mood and affect.    Labs reviewed: Basic Metabolic Panel:  Recent Labs  05/31/15 0442 06/01/15 0426 06/02/15 06/02/15 0430 06/08/15  NA 141 142 143 143 145  K 3.7 3.5  --  3.7 3.9  CL 107 110  --  112*  --   CO2 23 22  --  22  --   GLUCOSE 110* 99  --  94  --   BUN '15 15 15 15 17  '$ CREATININE 1.02 0.94 1.0 1.02 1.2  CALCIUM 8.6* 8.0*  --  8.1*  --    Liver Function Tests:  Recent Labs  08/19/14 2030 11/07/14 1408 05/31/15 0442 06/08/15  AST 32 '24 15 21  '$ ALT 10* 14* 12* 13  ALKPHOS 54 53 50 61  BILITOT 1.5* 1.0 1.3*  --   PROT 7.2 6.9 6.7  --   ALBUMIN 3.9 3.9 3.7  --    No results for input(s): LIPASE, AMYLASE in the last 8760 hours. No results for input(s): AMMONIA in the last 8760 hours. CBC:  Recent Labs  10/24/14 1326  11/07/14 1408 05/30/15 1832 05/31/15 0442 06/01/15 0426  06/02/15 0430 06/08/15 06/15/15  WBC 10.6*  < > 11.0* 14.8* 11.8* 11.8*  < > 11.2* 11.3 11.1  NEUTROABS 8.2*  --  8.1* 12.0*  --   --   --   --   --   --   HGB 14.4  < > 13.9 15.5 13.9 13.7  --  13.4 14.9 14.4  HCT 46.4  < > 45.0 47.6 43.9 41.8  --  40.4 47 46  MCV 93.7  < > 93.4 91.2 91.5 88.7  --  88.4  --   --   PLT 212  < > 234 207 182 176  --  188 345 240  < > = values in this interval not displayed. Cardiac Enzymes:  Recent Labs  08/19/14  2030  TROPONINI <0.03   BNP: Invalid input(s): POCBNP CBG: No results for input(s): GLUCAP in the last 8760 hours.  Procedures and Imaging Studies During Stay: Dg Chest 2 View  05/30/2015  CLINICAL DATA:  Lightheadedness and sickness today. Weakness. History of dementia, lung cancer, bladder cancer. Ex-smoker. EXAM: CHEST  2 VIEW COMPARISON:  Chest x-rays dated 10/27/2014, 08/19/2014 05/21/2014. FINDINGS: Study is hypoinspiratory with crowding of the perihilar bronchovascular markings. Given the low lung volumes, lungs appear stable compared to previous exams. Suspect at least some  degree of chronic interstitial lung disease. No evidence of pneumonia. No pleural effusion or pneumothorax seen. Cardiomegaly appears stable given the low lung volumes. Atherosclerotic changes again noted along the walls of the thoracic aorta. Degenerative spurring is again seen throughout the kyphotic thoracic spine. There is an old stable compression fracture deformity within the lower thoracic spine. No evidence of acute osseous abnormality. IMPRESSION: 1. Low lung volumes. No evidence of acute cardiopulmonary abnormality. No evidence of pneumonia. 2. Cardiomegaly appears stable given the low lung volumes. No evidence of congestive heart failure. Electronically Signed   By: Franki Cabot M.D.   On: 05/30/2015 18:56    Assessment/Plan:    HTN  B/p stable. Continue Metoprolol succinate  25 mg daily. PCP to monitor BMP  Chronic Afib  Heart rate Controlled. Continue on Metoprolol succinate  25 mg daily.   Hyperlipidemia  Continue on Atorvastatin 10 mg Tablet at bedtime.   Depression  Continue on Bupropion 75 mg Tablet and Sertraline 100 mg Tablet. PCP to monitor for mood changes.   Abnormal Gait  has worked well with PT/OT now stable for discharge home to continue with PT for ROM, Exercise, gait stability and Muscle strengthening.Fall and safety precautions.   Malignant Neoplasm of urinary bladder  S/p surgery and indwelling foley Catheter. HH RN for foley Care and management. Continue on Nitrofurantoin and Methenamine 1 G Tablet for UTI prophylaxis.   Leukocytosis  Afebrile. S/p hosp admission from 05/30/15-06/03/15 pseudomonas UTI. He was started on antibiotics.He has completed Cipro x 5 days for UTI. Recent WBC 11.1 (06/15/2015) previous 11.3 Will obtain another U/A and C/S prior to discharge. PCP to monitor CBC    He is being discharged with the following home health services:   PT for ROM, Exercise, gait stability and Muscle strengthening.   Home health Nurse for Foley Catheter care  and management.  Patient is being discharged with the following durable medical equipment:   No DME required.  Patient has been advised to f/u with their PCP in 1-2 weeks to bring them up to date on their rehab stay.  Social services at facility was responsible for arranging this appointment.  Pt was provided with a 30 day supply of prescriptions for medications and refills must be obtained from their PCP.  For controlled substances, a more limited supply may be provided adequate until PCP appointment only.  Future labs/tests needed:  CBC, BMP with PCP

## 2015-06-17 DIAGNOSIS — Z8551 Personal history of malignant neoplasm of bladder: Secondary | ICD-10-CM | POA: Diagnosis not present

## 2015-06-17 DIAGNOSIS — N261 Atrophy of kidney (terminal): Secondary | ICD-10-CM | POA: Diagnosis not present

## 2015-06-19 DIAGNOSIS — F329 Major depressive disorder, single episode, unspecified: Secondary | ICD-10-CM | POA: Diagnosis not present

## 2015-06-19 DIAGNOSIS — M6281 Muscle weakness (generalized): Secondary | ICD-10-CM | POA: Diagnosis not present

## 2015-06-19 DIAGNOSIS — I482 Chronic atrial fibrillation: Secondary | ICD-10-CM | POA: Diagnosis not present

## 2015-06-19 DIAGNOSIS — F0390 Unspecified dementia without behavioral disturbance: Secondary | ICD-10-CM | POA: Diagnosis not present

## 2015-06-19 DIAGNOSIS — Z87440 Personal history of urinary (tract) infections: Secondary | ICD-10-CM | POA: Diagnosis not present

## 2015-06-19 DIAGNOSIS — B965 Pseudomonas (aeruginosa) (mallei) (pseudomallei) as the cause of diseases classified elsewhere: Secondary | ICD-10-CM | POA: Diagnosis not present

## 2015-06-19 DIAGNOSIS — Z85118 Personal history of other malignant neoplasm of bronchus and lung: Secondary | ICD-10-CM | POA: Diagnosis not present

## 2015-06-19 DIAGNOSIS — Z8673 Personal history of transient ischemic attack (TIA), and cerebral infarction without residual deficits: Secondary | ICD-10-CM | POA: Diagnosis not present

## 2015-06-19 DIAGNOSIS — Z9181 History of falling: Secondary | ICD-10-CM | POA: Diagnosis not present

## 2015-06-19 DIAGNOSIS — Z87891 Personal history of nicotine dependence: Secondary | ICD-10-CM | POA: Diagnosis not present

## 2015-06-19 DIAGNOSIS — I10 Essential (primary) hypertension: Secondary | ICD-10-CM | POA: Diagnosis not present

## 2015-06-19 DIAGNOSIS — Z902 Acquired absence of lung [part of]: Secondary | ICD-10-CM | POA: Diagnosis not present

## 2015-06-19 DIAGNOSIS — N390 Urinary tract infection, site not specified: Secondary | ICD-10-CM | POA: Diagnosis not present

## 2015-06-19 DIAGNOSIS — C679 Malignant neoplasm of bladder, unspecified: Secondary | ICD-10-CM | POA: Diagnosis not present

## 2015-06-19 DIAGNOSIS — R2689 Other abnormalities of gait and mobility: Secondary | ICD-10-CM | POA: Diagnosis not present

## 2015-06-19 DIAGNOSIS — Z7982 Long term (current) use of aspirin: Secondary | ICD-10-CM | POA: Diagnosis not present

## 2015-06-22 DIAGNOSIS — M7022 Olecranon bursitis, left elbow: Secondary | ICD-10-CM | POA: Diagnosis not present

## 2015-06-22 DIAGNOSIS — M6281 Muscle weakness (generalized): Secondary | ICD-10-CM | POA: Diagnosis not present

## 2015-06-22 DIAGNOSIS — L989 Disorder of the skin and subcutaneous tissue, unspecified: Secondary | ICD-10-CM | POA: Diagnosis not present

## 2015-06-22 DIAGNOSIS — R627 Adult failure to thrive: Secondary | ICD-10-CM | POA: Diagnosis not present

## 2015-06-22 DIAGNOSIS — B965 Pseudomonas (aeruginosa) (mallei) (pseudomallei) as the cause of diseases classified elsewhere: Secondary | ICD-10-CM | POA: Diagnosis not present

## 2015-06-22 DIAGNOSIS — R296 Repeated falls: Secondary | ICD-10-CM | POA: Diagnosis not present

## 2015-06-22 DIAGNOSIS — F0280 Dementia in other diseases classified elsewhere without behavioral disturbance: Secondary | ICD-10-CM | POA: Diagnosis not present

## 2015-06-22 DIAGNOSIS — R2689 Other abnormalities of gait and mobility: Secondary | ICD-10-CM | POA: Diagnosis not present

## 2015-06-22 DIAGNOSIS — N390 Urinary tract infection, site not specified: Secondary | ICD-10-CM | POA: Diagnosis not present

## 2015-06-22 DIAGNOSIS — I10 Essential (primary) hypertension: Secondary | ICD-10-CM | POA: Diagnosis not present

## 2015-06-22 DIAGNOSIS — Z6827 Body mass index (BMI) 27.0-27.9, adult: Secondary | ICD-10-CM | POA: Diagnosis not present

## 2015-06-22 DIAGNOSIS — C679 Malignant neoplasm of bladder, unspecified: Secondary | ICD-10-CM | POA: Diagnosis not present

## 2015-06-23 ENCOUNTER — Emergency Department (HOSPITAL_COMMUNITY): Payer: Medicare Other

## 2015-06-23 ENCOUNTER — Emergency Department (HOSPITAL_COMMUNITY)
Admission: EM | Admit: 2015-06-23 | Discharge: 2015-06-23 | Disposition: A | Discharge status: Home / Self Care | Payer: Medicare Other | Attending: Emergency Medicine | Admitting: Emergency Medicine

## 2015-06-23 ENCOUNTER — Encounter (HOSPITAL_COMMUNITY): Payer: Self-pay | Admitting: Emergency Medicine

## 2015-06-23 DIAGNOSIS — Y9389 Activity, other specified: Secondary | ICD-10-CM | POA: Insufficient documentation

## 2015-06-23 DIAGNOSIS — Z7982 Long term (current) use of aspirin: Secondary | ICD-10-CM | POA: Insufficient documentation

## 2015-06-23 DIAGNOSIS — F329 Major depressive disorder, single episode, unspecified: Secondary | ICD-10-CM | POA: Diagnosis not present

## 2015-06-23 DIAGNOSIS — E785 Hyperlipidemia, unspecified: Secondary | ICD-10-CM | POA: Insufficient documentation

## 2015-06-23 DIAGNOSIS — F0390 Unspecified dementia without behavioral disturbance: Secondary | ICD-10-CM | POA: Diagnosis not present

## 2015-06-23 DIAGNOSIS — Z8551 Personal history of malignant neoplasm of bladder: Secondary | ICD-10-CM | POA: Diagnosis not present

## 2015-06-23 DIAGNOSIS — M545 Low back pain: Secondary | ICD-10-CM | POA: Diagnosis not present

## 2015-06-23 DIAGNOSIS — R51 Headache: Secondary | ICD-10-CM | POA: Diagnosis not present

## 2015-06-23 DIAGNOSIS — Y92009 Unspecified place in unspecified non-institutional (private) residence as the place of occurrence of the external cause: Secondary | ICD-10-CM | POA: Diagnosis not present

## 2015-06-23 DIAGNOSIS — Z79899 Other long term (current) drug therapy: Secondary | ICD-10-CM | POA: Diagnosis not present

## 2015-06-23 DIAGNOSIS — I4891 Unspecified atrial fibrillation: Secondary | ICD-10-CM | POA: Insufficient documentation

## 2015-06-23 DIAGNOSIS — S3992XA Unspecified injury of lower back, initial encounter: Secondary | ICD-10-CM | POA: Diagnosis present

## 2015-06-23 DIAGNOSIS — S39012A Strain of muscle, fascia and tendon of lower back, initial encounter: Secondary | ICD-10-CM | POA: Diagnosis not present

## 2015-06-23 DIAGNOSIS — Z87891 Personal history of nicotine dependence: Secondary | ICD-10-CM | POA: Insufficient documentation

## 2015-06-23 DIAGNOSIS — W19XXXA Unspecified fall, initial encounter: Secondary | ICD-10-CM

## 2015-06-23 DIAGNOSIS — Z87440 Personal history of urinary (tract) infections: Secondary | ICD-10-CM | POA: Insufficient documentation

## 2015-06-23 DIAGNOSIS — S0990XA Unspecified injury of head, initial encounter: Secondary | ICD-10-CM | POA: Diagnosis not present

## 2015-06-23 DIAGNOSIS — I10 Essential (primary) hypertension: Secondary | ICD-10-CM | POA: Insufficient documentation

## 2015-06-23 DIAGNOSIS — Z85118 Personal history of other malignant neoplasm of bronchus and lung: Secondary | ICD-10-CM | POA: Insufficient documentation

## 2015-06-23 DIAGNOSIS — T148 Other injury of unspecified body region: Secondary | ICD-10-CM | POA: Diagnosis not present

## 2015-06-23 DIAGNOSIS — M546 Pain in thoracic spine: Secondary | ICD-10-CM | POA: Diagnosis not present

## 2015-06-23 DIAGNOSIS — Y998 Other external cause status: Secondary | ICD-10-CM | POA: Diagnosis not present

## 2015-06-23 DIAGNOSIS — Z8719 Personal history of other diseases of the digestive system: Secondary | ICD-10-CM | POA: Insufficient documentation

## 2015-06-23 DIAGNOSIS — S79912A Unspecified injury of left hip, initial encounter: Secondary | ICD-10-CM | POA: Diagnosis not present

## 2015-06-23 DIAGNOSIS — W1839XA Other fall on same level, initial encounter: Secondary | ICD-10-CM | POA: Insufficient documentation

## 2015-06-23 NOTE — Discharge Instructions (Signed)
Fall Prevention in the Home  Falls can cause injuries and can affect people from all age groups. There are many simple things that you can do to make your home safe and to help prevent falls. WHAT CAN I DO ON THE OUTSIDE OF MY HOME?  Regularly repair the edges of walkways and driveways and fix any cracks.  Remove high doorway thresholds.  Trim any shrubbery on the main path into your home.  Use bright outdoor lighting.  Clear walkways of debris and clutter, including tools and rocks.  Regularly check that handrails are securely fastened and in good repair. Both sides of any steps should have handrails.  Install guardrails along the edges of any raised decks or porches.  Have leaves, snow, and ice cleared regularly.  Use sand or salt on walkways during winter months.  In the garage, clean up any spills right away, including grease or oil spills. WHAT CAN I DO IN THE BATHROOM?  Use night lights.  Install grab bars by the toilet and in the tub and shower. Do not use towel bars as grab bars.  Use non-skid mats or decals on the floor of the tub or shower.  If you need to sit down while you are in the shower, use a plastic, non-slip stool..  Keep the floor dry. Immediately clean up any water that spills on the floor.  Remove soap buildup in the tub or shower on a regular basis.  Attach bath mats securely with double-sided non-slip rug tape.  Remove throw rugs and other tripping hazards from the floor. WHAT CAN I DO IN THE BEDROOM?  Use night lights.  Make sure that a bedside light is easy to reach.  Do not use oversized bedding that drapes onto the floor.  Have a firm chair that has side arms to use for getting dressed.  Remove throw rugs and other tripping hazards from the floor. WHAT CAN I DO IN THE KITCHEN?   Clean up any spills right away.  Avoid walking on wet floors.  Place frequently used items in easy-to-reach places.  If you need to reach for something  above you, use a sturdy step stool that has a grab bar.  Keep electrical cables out of the way.  Do not use floor polish or wax that makes floors slippery. If you have to use wax, make sure that it is non-skid floor wax.  Remove throw rugs and other tripping hazards from the floor. WHAT CAN I DO IN THE STAIRWAYS?  Do not leave any items on the stairs.  Make sure that there are handrails on both sides of the stairs. Fix handrails that are broken or loose. Make sure that handrails are as long as the stairways.  Check any carpeting to make sure that it is firmly attached to the stairs. Fix any carpet that is loose or worn.  Avoid having throw rugs at the top or bottom of stairways, or secure the rugs with carpet tape to prevent them from moving.  Make sure that you have a light switch at the top of the stairs and the bottom of the stairs. If you do not have them, have them installed. WHAT ARE SOME OTHER FALL PREVENTION TIPS?  Wear closed-toe shoes that fit well and support your feet. Wear shoes that have rubber soles or low heels.  When you use a stepladder, make sure that it is completely opened and that the sides are firmly locked. Have someone hold the ladder while you   are using it. Do not climb a closed stepladder.  Add color or contrast paint or tape to grab bars and handrails in your home. Place contrasting color strips on the first and last steps.  Use mobility aids as needed, such as canes, walkers, scooters, and crutches.  Turn on lights if it is dark. Replace any light bulbs that burn out.  Set up furniture so that there are clear paths. Keep the furniture in the same spot.  Fix any uneven floor surfaces.  Choose a carpet design that does not hide the edge of steps of a stairway.  Be aware of any and all pets.  Review your medicines with your healthcare provider. Some medicines can cause dizziness or changes in blood pressure, which increase your risk of falling. Talk  with your health care provider about other ways that you can decrease your risk of falls. This may include working with a physical therapist or trainer to improve your strength, balance, and endurance.   This information is not intended to replace advice given to you by your health care provider. Make sure you discuss any questions you have with your health care provider.   Document Released: 01/28/2002 Document Revised: 06/24/2014 Document Reviewed: 03/14/2014 Elsevier Interactive Patient Education 2016 Elsevier Inc.  

## 2015-06-23 NOTE — ED Notes (Signed)
Pt arrives from home via [facility] reporting unwitnessed fall this am, subsequent headache, lower back pain and L hip pain.  EMS reports pt hit back of head in fall, denies LOC, N/V.  No shortening or rotation noted.  EMS reports hx dementia.

## 2015-06-23 NOTE — ED Notes (Signed)
MD at bedside. 

## 2015-06-23 NOTE — ED Provider Notes (Signed)
CSN: [id number]     Arrival date & time 06/23/15  1011 History   First MD Initiated Contact with Patient 06/23/15 1016     Chief Complaint  Patient presents with  . Fall  . Hip Pain     Level V caveat due to dementia. HPI  Patient presents  After fall at home. Has home care. Reportedly had back pain. May also have left hip pain. Patient is now complaining of back pain does have a history of dementia.    Past Medical History  Diagnosis Date  . Atrial fibrillation ([place])   . Hyperlipidemia   . Hypertension   . UTI (urinary tract infection)   . Dementia   . Bladder cancer (HCC)     hx of surgery and chemo  . Lung cancer [facility])     Status post surgery resection  . Diverticulitis   . Depression 08/19/2014   Past Surgical History  Procedure Laterality Date  . Hernia repair  2006  . Cataract extraction, bilateral  1998, 2000  . Cystectomy  2002  . Lung removal, partial     Family History  Problem Relation Age of Onset  . Cancer Maternal Aunt     unknown  . Cancer Maternal Grandmother     unknown  . Aneurysm Brother   . Stroke Father     cerebral hemorrhage   Social History  Substance Use Topics  . Smoking status: Former Smoker    Types: Cigarettes    Quit date: 10/10/1991  . Smokeless tobacco: Never Used  . Alcohol Use: No    Review of Systems  Unable to perform ROS: Dementia      Allergies  Sulfa antibiotics and Sulfonamide derivatives  Home Medications   Prior to Admission medications   Medication Sig Start Date End Date Taking? Authorizing Provider  ALPHA LIPOIC ACID PO Take 200 mg by mouth every morning.     Historical Provider, MD  ascorbic acid (VITAMIN C) 1000 MG tablet Take 1,000 mg by mouth every morning.     Historical Provider, MD  aspirin 81 MG chewable tablet Chew 81 mg by mouth every morning.     Historical Provider, MD  atorvastatin (LIPITOR) 10 MG tablet Take 10 mg by mouth at bedtime.     Historical Provider, MD  buPROPion (WELLBUTRIN) 75 MG  tablet Take 75 mg by mouth every morning. daily    Historical Provider, MD  calcium carbonate (OS-CAL - DOSED IN MG OF ELEMENTAL CALCIUM) 1250 MG tablet Take 1 tablet by mouth daily with breakfast.     Historical Provider, MD  Cholecalciferol (EQL VITAMIN D3) 1000 units tablet Take 1,000 Units by mouth every morning.    Historical Provider, MD  Cyanocobalamin (VITAMIN B 12 PO) Take 50 mcg by mouth every morning.     Historical Provider, MD  hydrocortisone valerate cream (WESTCORT) 0.2 % Apply 1 application topically 2 (two) times daily as needed (itching).     Historical Provider, MD  ibuprofen (ADVIL,MOTRIN) 400 MG tablet Take 400 mg by mouth every 6 (six) hours as needed for moderate pain.    Historical Provider, MD  MELATONIN PO Take 3 mg by mouth at bedtime as needed (sleep).     Historical Provider, MD  methenamine (HIPREX) 1 G tablet Take 1 g by mouth 2 (two) times daily.     Historical Provider, MD  metoprolol succinate (TOPROL-XL) 25 MG 24 hr tablet Take 25 mg by mouth daily.     Historical  Provider, MD  nitrofurantoin (MACRODANTIN) 100 MG capsule Take 1 capsule (100 mg total) by mouth every evening. Resume 4/18 after antibiotics are done. 06/09/15   Orson Eva, MD  saccharomyces boulardii (FLORASTOR) 250 MG capsule Take 1 capsule (250 mg total) by mouth 2 (two) times daily. 06/02/15   Eugenie Filler, MD  sertraline (ZOLOFT) 100 MG tablet Take 100 mg by mouth daily.     Historical Provider, MD   BP 145/94 mmHg  Pulse 66  Temp(Src) 97.9 F (36.6 C) (Oral)  Resp 19  Ht '6\' 1"'$  (1.854 m)  Wt 201 lb (91.173 kg)  BMI 26.52 kg/m2  SpO2 93% Physical Exam  Constitutional: He appears well-developed.  HENT:  Head: Atraumatic.  Eyes: Pupils are equal, round, and reactive to light.  Cardiovascular: Normal rate.   Pulmonary/Chest: Effort normal.  Abdominal: There is no tenderness.  Musculoskeletal: He exhibits tenderness.  Lumbar tenderness. Good range of motion bilateral hips. No hip  tenderness. No cervical spine tenderness. Painless range of motion.  Neurological: He is alert.    ED Course  Procedures (including critical care time) Labs Review Labs Reviewed - No data to display  Imaging Review Dg Lumbar Spine Complete  06/23/2015  CLINICAL DATA:  [place] today at home.  Sharp lower back pain. EXAM: LUMBAR SPINE - COMPLETE 4+ VIEW COMPARISON:  04/30/2012.  CT, 06/13/2014 FINDINGS: No acute fracture. Moderate compression fracture of T12, stable from the prior CT. No other fractures. No spondylolisthesis. Mild loss of disc height at L1-L2 and L3-L4. Moderate loss disc height at L4-L5 and L5-S1. Endplate osteophytes at all levels. Mild endplate sclerosis at Z3-G9 and L5-S1. Bones are diffusely demineralized. There are calcifications along a normal caliber abdominal aorta and iliac arteries. IMPRESSION: 1. No acute fracture or spondylolisthesis. 2. Chronic moderate compression fracture T12. 3. Degenerative changes as described. Electronically Signed   By: Lajean Manes M.D.   On: 06/23/2015 11:34   Ct Head Wo Contrast  06/23/2015  CLINICAL DATA:  Pain following fall earlier today. EXAM: CT HEAD WITHOUT CONTRAST TECHNIQUE: Contiguous axial images were obtained from the base of the skull through the vertex without intravenous contrast. COMPARISON:  November 07, 2014 FINDINGS: Moderate diffuse atrophy is stable. There is no intracranial mass, hemorrhage, extra-axial fluid collection, or midline shift. There is periventricular small vessel disease throughout the centra semiovale bilaterally. Small vessel disease is also noted in the anterior limbs of each internal and external capsule region. No acute infarct evident. The bony calvarium appears intact. The mastoid air cells are clear. There is mucosal thickening in each maxillary antrum as well as in several ethmoid air cells bilaterally. No intraorbital lesions are appreciable. IMPRESSION: Atrophy with supratentorial small vessel disease,  stable. No acute infarct evident. No hemorrhage, mass, or extra-axial fluid collection. Areas of paranasal sinus disease as noted above. Electronically Signed   By: Lowella Grip III M.D.   On: 06/23/2015 11:11   I have personally reviewed and evaluated these images and lab results as part of my medical decision-making.   EKG Interpretation   Date/Time:  Tuesday Jun 23 2015 10:13:18 EDT Ventricular Rate:  78 PR Interval:    QRS Duration: 102 QT Interval:  417 QTC Calculation: 475 R Axis:   25 Text Interpretation:  Atrial fibrillation Low voltage, extremity leads  Probable anteroseptal infarct, old Confirmed by Alvino Chapel  MD, Robbie Nangle  [phone]) on 06/23/2015 10:18:04 AM      MDM   Final diagnoses:  Fall, initial encounter  Lumbar strain, initial encounter     Patient with fall. Lumbar pain and negative x-ray for fracture. Negative neck pain. Head and CT reassuring. Baseline mental status. Patient has been doing Poorly at home but does have home health. Discussed with patient's family member and case management. Will have follow-up with primary care doctor.    Davonna Belling, MD 06/23/15 [phone]

## 2015-06-23 NOTE — ED Notes (Signed)
Patient transported to CT 

## 2015-06-26 DIAGNOSIS — C679 Malignant neoplasm of bladder, unspecified: Secondary | ICD-10-CM | POA: Diagnosis not present

## 2015-06-26 DIAGNOSIS — I10 Essential (primary) hypertension: Secondary | ICD-10-CM | POA: Diagnosis not present

## 2015-06-26 DIAGNOSIS — N390 Urinary tract infection, site not specified: Secondary | ICD-10-CM | POA: Diagnosis not present

## 2015-06-26 DIAGNOSIS — R2689 Other abnormalities of gait and mobility: Secondary | ICD-10-CM | POA: Diagnosis not present

## 2015-06-26 DIAGNOSIS — M6281 Muscle weakness (generalized): Secondary | ICD-10-CM | POA: Diagnosis not present

## 2015-06-26 DIAGNOSIS — B965 Pseudomonas (aeruginosa) (mallei) (pseudomallei) as the cause of diseases classified elsewhere: Secondary | ICD-10-CM | POA: Diagnosis not present

## 2015-06-29 DIAGNOSIS — B965 Pseudomonas (aeruginosa) (mallei) (pseudomallei) as the cause of diseases classified elsewhere: Secondary | ICD-10-CM | POA: Diagnosis not present

## 2015-06-29 DIAGNOSIS — I10 Essential (primary) hypertension: Secondary | ICD-10-CM | POA: Diagnosis not present

## 2015-06-29 DIAGNOSIS — N390 Urinary tract infection, site not specified: Secondary | ICD-10-CM | POA: Diagnosis not present

## 2015-06-29 DIAGNOSIS — M6281 Muscle weakness (generalized): Secondary | ICD-10-CM | POA: Diagnosis not present

## 2015-06-29 DIAGNOSIS — R2689 Other abnormalities of gait and mobility: Secondary | ICD-10-CM | POA: Diagnosis not present

## 2015-06-29 DIAGNOSIS — C679 Malignant neoplasm of bladder, unspecified: Secondary | ICD-10-CM | POA: Diagnosis not present

## 2015-06-30 DIAGNOSIS — C679 Malignant neoplasm of bladder, unspecified: Secondary | ICD-10-CM | POA: Diagnosis not present

## 2015-06-30 DIAGNOSIS — M6281 Muscle weakness (generalized): Secondary | ICD-10-CM | POA: Diagnosis not present

## 2015-06-30 DIAGNOSIS — I10 Essential (primary) hypertension: Secondary | ICD-10-CM | POA: Diagnosis not present

## 2015-06-30 DIAGNOSIS — B965 Pseudomonas (aeruginosa) (mallei) (pseudomallei) as the cause of diseases classified elsewhere: Secondary | ICD-10-CM | POA: Diagnosis not present

## 2015-06-30 DIAGNOSIS — R2689 Other abnormalities of gait and mobility: Secondary | ICD-10-CM | POA: Diagnosis not present

## 2015-06-30 DIAGNOSIS — N390 Urinary tract infection, site not specified: Secondary | ICD-10-CM | POA: Diagnosis not present

## 2015-07-02 DIAGNOSIS — B965 Pseudomonas (aeruginosa) (mallei) (pseudomallei) as the cause of diseases classified elsewhere: Secondary | ICD-10-CM | POA: Diagnosis not present

## 2015-07-02 DIAGNOSIS — I10 Essential (primary) hypertension: Secondary | ICD-10-CM | POA: Diagnosis not present

## 2015-07-02 DIAGNOSIS — R2689 Other abnormalities of gait and mobility: Secondary | ICD-10-CM | POA: Diagnosis not present

## 2015-07-02 DIAGNOSIS — N390 Urinary tract infection, site not specified: Secondary | ICD-10-CM | POA: Diagnosis not present

## 2015-07-02 DIAGNOSIS — R531 Weakness: Secondary | ICD-10-CM | POA: Diagnosis not present

## 2015-07-02 DIAGNOSIS — M6281 Muscle weakness (generalized): Secondary | ICD-10-CM | POA: Diagnosis not present

## 2015-07-02 DIAGNOSIS — C679 Malignant neoplasm of bladder, unspecified: Secondary | ICD-10-CM | POA: Diagnosis not present

## 2015-07-03 DIAGNOSIS — R2689 Other abnormalities of gait and mobility: Secondary | ICD-10-CM | POA: Diagnosis not present

## 2015-07-03 DIAGNOSIS — C679 Malignant neoplasm of bladder, unspecified: Secondary | ICD-10-CM | POA: Diagnosis not present

## 2015-07-03 DIAGNOSIS — I10 Essential (primary) hypertension: Secondary | ICD-10-CM | POA: Diagnosis not present

## 2015-07-03 DIAGNOSIS — M6281 Muscle weakness (generalized): Secondary | ICD-10-CM | POA: Diagnosis not present

## 2015-07-03 DIAGNOSIS — N390 Urinary tract infection, site not specified: Secondary | ICD-10-CM | POA: Diagnosis not present

## 2015-07-03 DIAGNOSIS — B965 Pseudomonas (aeruginosa) (mallei) (pseudomallei) as the cause of diseases classified elsewhere: Secondary | ICD-10-CM | POA: Diagnosis not present

## 2015-07-07 DIAGNOSIS — R2689 Other abnormalities of gait and mobility: Secondary | ICD-10-CM | POA: Diagnosis not present

## 2015-07-07 DIAGNOSIS — I10 Essential (primary) hypertension: Secondary | ICD-10-CM | POA: Diagnosis not present

## 2015-07-07 DIAGNOSIS — M6281 Muscle weakness (generalized): Secondary | ICD-10-CM | POA: Diagnosis not present

## 2015-07-07 DIAGNOSIS — B965 Pseudomonas (aeruginosa) (mallei) (pseudomallei) as the cause of diseases classified elsewhere: Secondary | ICD-10-CM | POA: Diagnosis not present

## 2015-07-07 DIAGNOSIS — N390 Urinary tract infection, site not specified: Secondary | ICD-10-CM | POA: Diagnosis not present

## 2015-07-07 DIAGNOSIS — C679 Malignant neoplasm of bladder, unspecified: Secondary | ICD-10-CM | POA: Diagnosis not present

## 2015-07-09 DIAGNOSIS — M6281 Muscle weakness (generalized): Secondary | ICD-10-CM | POA: Diagnosis not present

## 2015-07-09 DIAGNOSIS — C679 Malignant neoplasm of bladder, unspecified: Secondary | ICD-10-CM | POA: Diagnosis not present

## 2015-07-09 DIAGNOSIS — I10 Essential (primary) hypertension: Secondary | ICD-10-CM | POA: Diagnosis not present

## 2015-07-09 DIAGNOSIS — R2689 Other abnormalities of gait and mobility: Secondary | ICD-10-CM | POA: Diagnosis not present

## 2015-07-09 DIAGNOSIS — N390 Urinary tract infection, site not specified: Secondary | ICD-10-CM | POA: Diagnosis not present

## 2015-07-09 DIAGNOSIS — B965 Pseudomonas (aeruginosa) (mallei) (pseudomallei) as the cause of diseases classified elsewhere: Secondary | ICD-10-CM | POA: Diagnosis not present

## 2015-07-10 DIAGNOSIS — R2689 Other abnormalities of gait and mobility: Secondary | ICD-10-CM | POA: Diagnosis not present

## 2015-07-10 DIAGNOSIS — B965 Pseudomonas (aeruginosa) (mallei) (pseudomallei) as the cause of diseases classified elsewhere: Secondary | ICD-10-CM | POA: Diagnosis not present

## 2015-07-10 DIAGNOSIS — M6281 Muscle weakness (generalized): Secondary | ICD-10-CM | POA: Diagnosis not present

## 2015-07-10 DIAGNOSIS — C679 Malignant neoplasm of bladder, unspecified: Secondary | ICD-10-CM | POA: Diagnosis not present

## 2015-07-10 DIAGNOSIS — I10 Essential (primary) hypertension: Secondary | ICD-10-CM | POA: Diagnosis not present

## 2015-07-10 DIAGNOSIS — N390 Urinary tract infection, site not specified: Secondary | ICD-10-CM | POA: Diagnosis not present

## 2015-07-11 DIAGNOSIS — I10 Essential (primary) hypertension: Secondary | ICD-10-CM | POA: Diagnosis not present

## 2015-07-11 DIAGNOSIS — M6281 Muscle weakness (generalized): Secondary | ICD-10-CM | POA: Diagnosis not present

## 2015-07-11 DIAGNOSIS — C679 Malignant neoplasm of bladder, unspecified: Secondary | ICD-10-CM | POA: Diagnosis not present

## 2015-07-11 DIAGNOSIS — B965 Pseudomonas (aeruginosa) (mallei) (pseudomallei) as the cause of diseases classified elsewhere: Secondary | ICD-10-CM | POA: Diagnosis not present

## 2015-07-11 DIAGNOSIS — N390 Urinary tract infection, site not specified: Secondary | ICD-10-CM | POA: Diagnosis not present

## 2015-07-11 DIAGNOSIS — R2689 Other abnormalities of gait and mobility: Secondary | ICD-10-CM | POA: Diagnosis not present

## 2015-07-13 DIAGNOSIS — N1330 Unspecified hydronephrosis: Secondary | ICD-10-CM | POA: Diagnosis not present

## 2015-07-13 DIAGNOSIS — Z8551 Personal history of malignant neoplasm of bladder: Secondary | ICD-10-CM | POA: Diagnosis not present

## 2015-07-13 DIAGNOSIS — N390 Urinary tract infection, site not specified: Secondary | ICD-10-CM | POA: Diagnosis not present

## 2015-07-14 DIAGNOSIS — C44319 Basal cell carcinoma of skin of other parts of face: Secondary | ICD-10-CM | POA: Diagnosis not present

## 2015-07-14 DIAGNOSIS — R2689 Other abnormalities of gait and mobility: Secondary | ICD-10-CM | POA: Diagnosis not present

## 2015-07-14 DIAGNOSIS — Z85828 Personal history of other malignant neoplasm of skin: Secondary | ICD-10-CM | POA: Diagnosis not present

## 2015-07-14 DIAGNOSIS — N390 Urinary tract infection, site not specified: Secondary | ICD-10-CM | POA: Diagnosis not present

## 2015-07-14 DIAGNOSIS — C679 Malignant neoplasm of bladder, unspecified: Secondary | ICD-10-CM | POA: Diagnosis not present

## 2015-07-14 DIAGNOSIS — I10 Essential (primary) hypertension: Secondary | ICD-10-CM | POA: Diagnosis not present

## 2015-07-14 DIAGNOSIS — B965 Pseudomonas (aeruginosa) (mallei) (pseudomallei) as the cause of diseases classified elsewhere: Secondary | ICD-10-CM | POA: Diagnosis not present

## 2015-07-14 DIAGNOSIS — M6281 Muscle weakness (generalized): Secondary | ICD-10-CM | POA: Diagnosis not present

## 2015-07-14 DIAGNOSIS — L570 Actinic keratosis: Secondary | ICD-10-CM | POA: Diagnosis not present

## 2015-07-14 DIAGNOSIS — L309 Dermatitis, unspecified: Secondary | ICD-10-CM | POA: Diagnosis not present

## 2015-07-15 DIAGNOSIS — R2689 Other abnormalities of gait and mobility: Secondary | ICD-10-CM | POA: Diagnosis not present

## 2015-07-15 DIAGNOSIS — I10 Essential (primary) hypertension: Secondary | ICD-10-CM | POA: Diagnosis not present

## 2015-07-15 DIAGNOSIS — C679 Malignant neoplasm of bladder, unspecified: Secondary | ICD-10-CM | POA: Diagnosis not present

## 2015-07-15 DIAGNOSIS — B965 Pseudomonas (aeruginosa) (mallei) (pseudomallei) as the cause of diseases classified elsewhere: Secondary | ICD-10-CM | POA: Diagnosis not present

## 2015-07-15 DIAGNOSIS — M6281 Muscle weakness (generalized): Secondary | ICD-10-CM | POA: Diagnosis not present

## 2015-07-15 DIAGNOSIS — N390 Urinary tract infection, site not specified: Secondary | ICD-10-CM | POA: Diagnosis not present

## 2015-07-16 DIAGNOSIS — M6281 Muscle weakness (generalized): Secondary | ICD-10-CM | POA: Diagnosis not present

## 2015-07-16 DIAGNOSIS — B965 Pseudomonas (aeruginosa) (mallei) (pseudomallei) as the cause of diseases classified elsewhere: Secondary | ICD-10-CM | POA: Diagnosis not present

## 2015-07-16 DIAGNOSIS — N390 Urinary tract infection, site not specified: Secondary | ICD-10-CM | POA: Diagnosis not present

## 2015-07-16 DIAGNOSIS — C679 Malignant neoplasm of bladder, unspecified: Secondary | ICD-10-CM | POA: Diagnosis not present

## 2015-07-16 DIAGNOSIS — R2689 Other abnormalities of gait and mobility: Secondary | ICD-10-CM | POA: Diagnosis not present

## 2015-07-16 DIAGNOSIS — I10 Essential (primary) hypertension: Secondary | ICD-10-CM | POA: Diagnosis not present

## 2015-07-17 DIAGNOSIS — N390 Urinary tract infection, site not specified: Secondary | ICD-10-CM | POA: Diagnosis not present

## 2015-07-17 DIAGNOSIS — B965 Pseudomonas (aeruginosa) (mallei) (pseudomallei) as the cause of diseases classified elsewhere: Secondary | ICD-10-CM | POA: Diagnosis not present

## 2015-07-17 DIAGNOSIS — M6281 Muscle weakness (generalized): Secondary | ICD-10-CM | POA: Diagnosis not present

## 2015-07-17 DIAGNOSIS — I10 Essential (primary) hypertension: Secondary | ICD-10-CM | POA: Diagnosis not present

## 2015-07-17 DIAGNOSIS — R2689 Other abnormalities of gait and mobility: Secondary | ICD-10-CM | POA: Diagnosis not present

## 2015-07-17 DIAGNOSIS — C679 Malignant neoplasm of bladder, unspecified: Secondary | ICD-10-CM | POA: Diagnosis not present

## 2015-07-21 DIAGNOSIS — R2689 Other abnormalities of gait and mobility: Secondary | ICD-10-CM | POA: Diagnosis not present

## 2015-07-21 DIAGNOSIS — B965 Pseudomonas (aeruginosa) (mallei) (pseudomallei) as the cause of diseases classified elsewhere: Secondary | ICD-10-CM | POA: Diagnosis not present

## 2015-07-21 DIAGNOSIS — N390 Urinary tract infection, site not specified: Secondary | ICD-10-CM | POA: Diagnosis not present

## 2015-07-21 DIAGNOSIS — C679 Malignant neoplasm of bladder, unspecified: Secondary | ICD-10-CM | POA: Diagnosis not present

## 2015-07-21 DIAGNOSIS — I10 Essential (primary) hypertension: Secondary | ICD-10-CM | POA: Diagnosis not present

## 2015-07-21 DIAGNOSIS — M6281 Muscle weakness (generalized): Secondary | ICD-10-CM | POA: Diagnosis not present

## 2015-07-22 ENCOUNTER — Emergency Department (HOSPITAL_COMMUNITY): Payer: Medicare Other

## 2015-07-22 ENCOUNTER — Encounter (HOSPITAL_COMMUNITY): Payer: Self-pay | Admitting: Emergency Medicine

## 2015-07-22 ENCOUNTER — Inpatient Hospital Stay (HOSPITAL_COMMUNITY)
Admission: EM | Admit: 2015-07-22 | Discharge: 2015-07-24 | DRG: 698 | Disposition: A | Discharge status: Home Health | Payer: Medicare Other | Attending: Internal Medicine | Admitting: Internal Medicine

## 2015-07-22 DIAGNOSIS — K649 Unspecified hemorrhoids: Secondary | ICD-10-CM | POA: Diagnosis present

## 2015-07-22 DIAGNOSIS — G629 Polyneuropathy, unspecified: Secondary | ICD-10-CM | POA: Diagnosis present

## 2015-07-22 DIAGNOSIS — K625 Hemorrhage of anus and rectum: Secondary | ICD-10-CM | POA: Diagnosis present

## 2015-07-22 DIAGNOSIS — E785 Hyperlipidemia, unspecified: Secondary | ICD-10-CM | POA: Diagnosis present

## 2015-07-22 DIAGNOSIS — Z66 Do not resuscitate: Secondary | ICD-10-CM | POA: Diagnosis present

## 2015-07-22 DIAGNOSIS — Z882 Allergy status to sulfonamides status: Secondary | ICD-10-CM | POA: Diagnosis not present

## 2015-07-22 DIAGNOSIS — Z9842 Cataract extraction status, left eye: Secondary | ICD-10-CM

## 2015-07-22 DIAGNOSIS — Y846 Urinary catheterization as the cause of abnormal reaction of the patient, or of later complication, without mention of misadventure at the time of the procedure: Secondary | ICD-10-CM | POA: Diagnosis present

## 2015-07-22 DIAGNOSIS — E860 Dehydration: Secondary | ICD-10-CM | POA: Diagnosis present

## 2015-07-22 DIAGNOSIS — F329 Major depressive disorder, single episode, unspecified: Secondary | ICD-10-CM | POA: Diagnosis present

## 2015-07-22 DIAGNOSIS — G9341 Metabolic encephalopathy: Secondary | ICD-10-CM

## 2015-07-22 DIAGNOSIS — F0280 Dementia in other diseases classified elsewhere without behavioral disturbance: Secondary | ICD-10-CM | POA: Diagnosis present

## 2015-07-22 DIAGNOSIS — R001 Bradycardia, unspecified: Secondary | ICD-10-CM | POA: Diagnosis present

## 2015-07-22 DIAGNOSIS — Z7982 Long term (current) use of aspirin: Secondary | ICD-10-CM | POA: Diagnosis not present

## 2015-07-22 DIAGNOSIS — N390 Urinary tract infection, site not specified: Secondary | ICD-10-CM

## 2015-07-22 DIAGNOSIS — Z809 Family history of malignant neoplasm, unspecified: Secondary | ICD-10-CM | POA: Diagnosis not present

## 2015-07-22 DIAGNOSIS — T83518A Infection and inflammatory reaction due to other urinary catheter, initial encounter: Secondary | ICD-10-CM | POA: Diagnosis present

## 2015-07-22 DIAGNOSIS — D72829 Elevated white blood cell count, unspecified: Secondary | ICD-10-CM | POA: Diagnosis present

## 2015-07-22 DIAGNOSIS — N12 Tubulo-interstitial nephritis, not specified as acute or chronic: Secondary | ICD-10-CM | POA: Diagnosis present

## 2015-07-22 DIAGNOSIS — C679 Malignant neoplasm of bladder, unspecified: Secondary | ICD-10-CM | POA: Diagnosis present

## 2015-07-22 DIAGNOSIS — Z823 Family history of stroke: Secondary | ICD-10-CM | POA: Diagnosis not present

## 2015-07-22 DIAGNOSIS — I10 Essential (primary) hypertension: Secondary | ICD-10-CM

## 2015-07-22 DIAGNOSIS — B9620 Unspecified Escherichia coli [E. coli] as the cause of diseases classified elsewhere: Secondary | ICD-10-CM | POA: Diagnosis present

## 2015-07-22 DIAGNOSIS — Z9841 Cataract extraction status, right eye: Secondary | ICD-10-CM

## 2015-07-22 DIAGNOSIS — I482 Chronic atrial fibrillation: Secondary | ICD-10-CM | POA: Diagnosis present

## 2015-07-22 DIAGNOSIS — K219 Gastro-esophageal reflux disease without esophagitis: Secondary | ICD-10-CM | POA: Diagnosis present

## 2015-07-22 DIAGNOSIS — R410 Disorientation, unspecified: Secondary | ICD-10-CM | POA: Diagnosis not present

## 2015-07-22 DIAGNOSIS — Z79899 Other long term (current) drug therapy: Secondary | ICD-10-CM

## 2015-07-22 DIAGNOSIS — G92 Toxic encephalopathy: Secondary | ICD-10-CM | POA: Diagnosis present

## 2015-07-22 DIAGNOSIS — C3490 Malignant neoplasm of unspecified part of unspecified bronchus or lung: Secondary | ICD-10-CM | POA: Diagnosis present

## 2015-07-22 DIAGNOSIS — Z87440 Personal history of urinary (tract) infections: Secondary | ICD-10-CM

## 2015-07-22 DIAGNOSIS — R4182 Altered mental status, unspecified: Secondary | ICD-10-CM | POA: Diagnosis not present

## 2015-07-22 DIAGNOSIS — Z8673 Personal history of transient ischemic attack (TIA), and cerebral infarction without residual deficits: Secondary | ICD-10-CM

## 2015-07-22 DIAGNOSIS — Z87891 Personal history of nicotine dependence: Secondary | ICD-10-CM

## 2015-07-22 DIAGNOSIS — Z452 Encounter for adjustment and management of vascular access device: Secondary | ICD-10-CM | POA: Diagnosis not present

## 2015-07-22 DIAGNOSIS — I4891 Unspecified atrial fibrillation: Secondary | ICD-10-CM | POA: Diagnosis present

## 2015-07-22 HISTORY — DX: Cerebral infarction, unspecified: I63.9

## 2015-07-22 LAB — CBC WITH DIFFERENTIAL/PLATELET
BASOS PCT: 0 %
Basophils Absolute: 0 10*3/uL (ref 0.0–0.1)
EOS PCT: 2 %
Eosinophils Absolute: 0.2 10*3/uL (ref 0.0–0.7)
HEMATOCRIT: 45.4 % (ref 39.0–52.0)
Hemoglobin: 15.1 g/dL (ref 13.0–17.0)
LYMPHS ABS: 1.2 10*3/uL (ref 0.7–4.0)
Lymphocytes Relative: 10 %
MCH: 29.4 pg (ref 26.0–34.0)
MCHC: 33.3 g/dL (ref 30.0–36.0)
MCV: 88.5 fL (ref 78.0–100.0)
MONO ABS: 0.9 10*3/uL (ref 0.1–1.0)
MONOS PCT: 7 %
Neutro Abs: 10.1 10*3/uL — ABNORMAL HIGH (ref 1.7–7.7)
Neutrophils Relative %: 81 %
Platelets: 211 10*3/uL (ref 150–400)
RBC: 5.13 MIL/uL (ref 4.22–5.81)
RDW: 14.6 % (ref 11.5–15.5)
WBC: 12.4 10*3/uL — AB (ref 4.0–10.5)

## 2015-07-22 LAB — COMPREHENSIVE METABOLIC PANEL
ALBUMIN: 4.1 g/dL (ref 3.5–5.0)
ALT: 14 U/L — AB (ref 17–63)
AST: 18 U/L (ref 15–41)
Alkaline Phosphatase: 61 U/L (ref 38–126)
Anion gap: 7 (ref 5–15)
BUN: 23 mg/dL — AB (ref 6–20)
CHLORIDE: 106 mmol/L (ref 101–111)
CO2: 26 mmol/L (ref 22–32)
CREATININE: 1.08 mg/dL (ref 0.61–1.24)
Calcium: 9.4 mg/dL (ref 8.9–10.3)
GFR calc Af Amer: >60 mL/min (ref >60)
GFR calc non Af Amer: >60 mL/min (ref >60)
GLUCOSE: 115 mg/dL — AB (ref 65–99)
POTASSIUM: 3.8 mmol/L (ref 3.5–5.1)
SODIUM: 139 mmol/L (ref 135–145)
Total Bilirubin: 1.1 mg/dL (ref 0.3–1.2)
Total Protein: 6.9 g/dL (ref 6.5–8.1)

## 2015-07-22 LAB — URINALYSIS, ROUTINE W REFLEX MICROSCOPIC
BILIRUBIN URINE: NEGATIVE
GLUCOSE, UA: NEGATIVE mg/dL
KETONES UR: NEGATIVE mg/dL
NITRITE: POSITIVE — AB
PH: 5.5 (ref 5.0–8.0)
PROTEIN: NEGATIVE mg/dL
Specific Gravity, Urine: 1.015 (ref 1.005–1.030)

## 2015-07-22 LAB — TYPE AND SCREEN
ABO/RH(D): A POS
Antibody Screen: NEGATIVE

## 2015-07-22 LAB — MAGNESIUM: Magnesium: 2.2 mg/dL (ref 1.7–2.4)

## 2015-07-22 LAB — URINE MICROSCOPIC-ADD ON

## 2015-07-22 LAB — ABO/RH: ABO/RH(D): A POS

## 2015-07-22 LAB — POC OCCULT BLOOD, ED: Fecal Occult Bld: POSITIVE — AB

## 2015-07-22 LAB — I-STAT CG4 LACTIC ACID, ED
LACTIC ACID, VENOUS: 1.77 mmol/L (ref 0.5–2.0)
LACTIC ACID, VENOUS: 0.67 mmol/L (ref 0.5–2.0)

## 2015-07-22 MED ORDER — PANTOPRAZOLE SODIUM 40 MG PO TBEC
40.0000 mg | DELAYED_RELEASE_TABLET | Freq: Every day | ORAL | Status: DC
  Administered 2015-07-23 – 2015-07-24 (×2): 40 mg via ORAL
  Filled 2015-07-22 (×2): qty 1

## 2015-07-22 MED ORDER — HYDROCORTISONE VALERATE 0.2 % EX CREA: 1.0000 "application " | TOPICAL_CREAM | Freq: Two times a day (BID) | CUTANEOUS | Status: DC | PRN

## 2015-07-22 MED ORDER — TIOTROPIUM BROMIDE MONOHYDRATE 18 MCG IN CAPS
18.0000 ug | ORAL_CAPSULE | Freq: Every day | RESPIRATORY_TRACT | Status: DC
  Filled 2015-07-22: qty 5

## 2015-07-22 MED ORDER — SODIUM CHLORIDE 0.9 % IV SOLN
INTRAVENOUS | Status: AC
  Administered 2015-07-22 – 2015-07-23 (×3): via INTRAVENOUS

## 2015-07-22 MED ORDER — SERTRALINE HCL 50 MG PO TABS
100.0000 mg | ORAL_TABLET | Freq: Every day | ORAL | Status: DC
  Administered 2015-07-23 – 2015-07-24 (×2): 100 mg via ORAL
  Filled 2015-07-22 (×2): qty 2

## 2015-07-22 MED ORDER — METHENAMINE MANDELATE 1 G PO TABS
1000.0000 mg | ORAL_TABLET | Freq: Two times a day (BID) | ORAL | Status: DC
  Administered 2015-07-22 – 2015-07-24 (×4): 1000 mg via ORAL
  Filled 2015-07-22 (×5): qty 1

## 2015-07-22 MED ORDER — METOPROLOL SUCCINATE ER 25 MG PO TB24
25.0000 mg | ORAL_TABLET | Freq: Every day | ORAL | Status: DC
  Administered 2015-07-23: 25 mg via ORAL
  Filled 2015-07-22: qty 1

## 2015-07-22 MED ORDER — DEXTROSE 5 % IV SOLN: 1.0000 g | Freq: Once | INTRAVENOUS | Status: DC

## 2015-07-22 MED ORDER — VITAMIN B-12 100 MCG PO TABS
50.0000 ug | ORAL_TABLET | Freq: Every morning | ORAL | Status: DC
  Administered 2015-07-23 – 2015-07-24 (×2): 50 ug via ORAL
  Filled 2015-07-22 (×2): qty 1

## 2015-07-22 MED ORDER — SORBITOL 70 % SOLN
30.0000 mL | Freq: Every day | Status: DC | PRN
  Filled 2015-07-22: qty 30

## 2015-07-22 MED ORDER — SENNOSIDES-DOCUSATE SODIUM 8.6-50 MG PO TABS: 1.0000 | ORAL_TABLET | Freq: Every evening | ORAL | Status: DC | PRN

## 2015-07-22 MED ORDER — ACETAMINOPHEN 325 MG PO TABS: 650.0000 mg | ORAL_TABLET | Freq: Four times a day (QID) | ORAL | Status: DC | PRN

## 2015-07-22 MED ORDER — ONDANSETRON HCL 4 MG PO TABS: 4.0000 mg | ORAL_TABLET | Freq: Four times a day (QID) | ORAL | Status: DC | PRN

## 2015-07-22 MED ORDER — ATORVASTATIN CALCIUM 10 MG PO TABS
10.0000 mg | ORAL_TABLET | Freq: Every day | ORAL | Status: DC
  Administered 2015-07-23: 10 mg via ORAL
  Filled 2015-07-22 (×2): qty 1

## 2015-07-22 MED ORDER — DEXTROSE 5 % IV SOLN
2.0000 g | Freq: Two times a day (BID) | INTRAVENOUS | Status: DC
  Administered 2015-07-23 – 2015-07-24 (×4): 2 g via INTRAVENOUS
  Filled 2015-07-22 (×4): qty 2

## 2015-07-22 MED ORDER — VITAMIN D3 25 MCG (1000 UNIT) PO TABS
1000.0000 [IU] | ORAL_TABLET | Freq: Every morning | ORAL | Status: DC
  Administered 2015-07-23 – 2015-07-24 (×2): 1000 [IU] via ORAL
  Filled 2015-07-22 (×4): qty 1

## 2015-07-22 MED ORDER — ACETAMINOPHEN 650 MG RE SUPP: 650.0000 mg | Freq: Four times a day (QID) | RECTAL | Status: DC | PRN

## 2015-07-22 MED ORDER — METHENAMINE HIPPURATE 1 G PO TABS: 1.0000 g | ORAL_TABLET | Freq: Two times a day (BID) | ORAL | Status: DC

## 2015-07-22 MED ORDER — OXYCODONE HCL 5 MG PO TABS
5.0000 mg | ORAL_TABLET | ORAL | Status: DC | PRN
Start: 2015-07-22 — End: 2015-07-24
  Administered 2015-07-22 – 2015-07-24 (×3): 5 mg via ORAL
  Filled 2015-07-22 (×3): qty 1

## 2015-07-22 MED ORDER — VANCOMYCIN HCL IN DEXTROSE 1-5 GM/200ML-% IV SOLN: 1000.0000 mg | Freq: Once | INTRAVENOUS | Status: DC

## 2015-07-22 MED ORDER — ALPHA LIPOIC ACID 200 MG PO CAPS: 200.0000 mg | ORAL_CAPSULE | Freq: Every morning | ORAL | Status: DC

## 2015-07-22 MED ORDER — DEXTROSE 5 % IV SOLN
2.0000 g | Freq: Once | INTRAVENOUS | Status: AC
  Administered 2015-07-22: 2 g via INTRAVENOUS
  Filled 2015-07-22: qty 2

## 2015-07-22 MED ORDER — ONDANSETRON HCL 4 MG/2ML IJ SOLN: 4.0000 mg | Freq: Four times a day (QID) | INTRAMUSCULAR | Status: DC | PRN

## 2015-07-22 MED ORDER — SODIUM CHLORIDE 0.9% FLUSH
3.0000 mL | Freq: Two times a day (BID) | INTRAVENOUS | Status: DC
  Administered 2015-07-23 (×2): 3 mL via INTRAVENOUS

## 2015-07-22 MED ORDER — SODIUM CHLORIDE 0.9 % IV SOLN: INTRAVENOUS | Status: DC

## 2015-07-22 MED ORDER — BUPROPION HCL 75 MG PO TABS
75.0000 mg | ORAL_TABLET | Freq: Every day | ORAL | Status: DC
  Administered 2015-07-23 – 2015-07-24 (×2): 75 mg via ORAL
  Filled 2015-07-22 (×3): qty 1

## 2015-07-22 MED ORDER — ALBUTEROL SULFATE (2.5 MG/3ML) 0.083% IN NEBU: 2.5000 mg | INHALATION_SOLUTION | RESPIRATORY_TRACT | Status: DC | PRN

## 2015-07-22 MED ORDER — VITAMIN C 500 MG PO TABS
1000.0000 mg | ORAL_TABLET | Freq: Every morning | ORAL | Status: DC
  Administered 2015-07-23 – 2015-07-24 (×2): 1000 mg via ORAL
  Filled 2015-07-22 (×2): qty 2

## 2015-07-22 MED ORDER — SODIUM CHLORIDE 0.9 % IV BOLUS (SEPSIS)
1000.0000 mL | Freq: Once | INTRAVENOUS | Status: AC
  Administered 2015-07-22: 1000 mL via INTRAVENOUS

## 2015-07-22 MED ORDER — SACCHAROMYCES BOULARDII 250 MG PO CAPS
250.0000 mg | ORAL_CAPSULE | Freq: Every day | ORAL | Status: DC
  Administered 2015-07-23 – 2015-07-24 (×2): 250 mg via ORAL
  Filled 2015-07-22 (×2): qty 1

## 2015-07-22 MED ORDER — CALCIUM CARBONATE 1250 (500 CA) MG PO TABS
1.0000 | ORAL_TABLET | Freq: Every day | ORAL | Status: DC
  Administered 2015-07-23 – 2015-07-24 (×2): 500 mg via ORAL
  Filled 2015-07-22 (×2): qty 1

## 2015-07-22 NOTE — ED Notes (Signed)
15:25 pt can go to floor .

## 2015-07-22 NOTE — Progress Notes (Signed)
Pharmacy Antibiotic Note  [name] is a 82 y.o. male admitted on 07/22/2015 with rectal bleeding and UTI with hx Afib, HLP, HTN, UTI, dementia and bladder/lung cancer.  Pharmacy has been consulted for Cefepime dosing.  Plan: Cefepime 2g IV q12h Follow up renal fxn, culture results, and clinical course.      Temp (24hrs), Avg:97.6 F (36.4 C), Min:97.6 F (36.4 C), Max:97.6 F (36.4 C)   Recent Labs Lab 07/22/15 1314 07/22/15 1326  WBC 12.4*  --   CREATININE 1.08  --   LATICACIDVEN  --  1.77    CrCl cannot be calculated (Unknown ideal weight.).    Allergies  Allergen Reactions  . Sulfa Antibiotics Nausea And Vomiting  . Sulfonamide Derivatives Nausea And Vomiting    Antimicrobials this admission: 5/31 Rocephin x 1 5/31 cefepime >>   Dose adjustments this admission:  Microbiology results: 5/31 BCx: 5/31 UCx:    Thank you for allowing pharmacy to be a part of this patient's care.  Gretta Arab PharmD, BCPS Pager [phone] 07/22/2015 3:10 PM

## 2015-07-22 NOTE — ED Notes (Signed)
Patient transported to CT and XR 

## 2015-07-22 NOTE — Consult Note (Signed)
Urology Consult  Referring physician: Irine Seal, MD Reason for referral: MDR UTI  Chief Complaint: MDR UTI  History of Present Illness:   82 yo male with a complex urologic past medical history is being seen in consultation at the request of Dr. Irine Seal for evaluation of a MDR UTI. He is followed by Dr. Diona Fanti for his bladder cancer and was last seen in the office on 07/13/15. He was originally referred by Dr. Jeffie Pollock in Sept 2002 for management of invasive bladder cancer. 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 Diona Fanti. He received postoperative chemotherapy. He subsequently developed right hydronephrosis which was initially treated with a percutaneous nephrostomy tube, then a stent. It was felt that this was probably due to a ureteral stricture versus nodal metastasis. Up until his visit here in May 2010, he was doing well from a urologic standpoint. He is quite a remarkable man and has done well. He is cancer free.  In-between 2010 and 2013, he was not followed here. He re-presented in September, 2013. At that time he had significant bilateral hydronephrosis, and a very large residual volume within his neobladder. He was placed on intermittent catheterization at that time. Because of recurring infections and patient debilitation, he was eventually placed on an indwelling Foley catheter, in September 2016.   He had recurrent urinary tract infections which were treated with suppressive antibiotics. When he was seen on 07/13/15 for a catheter change, his family was worried he may have another urinary tract infection due to an episode of altered mental status on 07/11/15 which was not associated with fevers or chills. A culture was sent from clinic and returned with >100K E.Coli which was only sensitive to Zosyn, cefepime, imipenem, gentamicin and tobramycin. When the family  was informed of the results and the option for IV Abx at home on 07/17/15 they elected to monitor the situation as he was acting normally. However, today he reports that he fell a couple days ago and this is what ultimately prompted his family to bring him to the ER today. He reports some mild lower back pain from the fall but denies fevers, chills, night sweats, new flank pain, suprapubic pain or other new symptoms.  While it is somewhat difficult to determine if this is a symptomatic UTI and the cause for his AMS versus chronic bacterial colonization he was brought to the ER by his family today due to concerns for AMS and was admitted to the hospitalist service for management of a his MDR UTI sensitive only to IV Antibiotics.   Of note, while he was in [facility], he was diagnosed with lung cancer, and underwent a left upper lobectomy. He did not need adjuvant radiation or chemotherapy. He has had no evidence of recurrence of his lung cancer. He does have problems with neuropathy. He has balance problems. This is felt secondary to strokes.   Urology was consulted due to his history of a neobladder and recurrent UTIs.   Past Medical History  Diagnosis Date  . Atrial fibrillation ([place])   . Hyperlipidemia   . Hypertension   . UTI (urinary tract infection)   . Dementia   . Bladder cancer (HCC)     hx of surgery and chemo  . Lung cancer [facility])     Status post surgery resection  . Diverticulitis   . Depression 08/19/2014   Past Surgical History  Procedure Laterality Date  .  Hernia repair  2006  . Cataract extraction, bilateral  1998, 2000  . Cystectomy  2002  . Lung removal, partial      Medications: I have reviewed the patient's current medications. Allergies:  Allergies  Allergen Reactions  . Sulfa Antibiotics Nausea And Vomiting  . Sulfonamide Derivatives Nausea And Vomiting    Family History  Problem Relation Age of Onset  . Cancer Maternal Aunt     unknown  .  Cancer Maternal Grandmother     unknown  . Aneurysm Brother   . Stroke Father     cerebral hemorrhage   Social History:  reports that he quit smoking about 23 years ago. His smoking use included Cigarettes. He has never used smokeless tobacco. He reports that he does not drink alcohol or use illicit drugs.  ROS 12 system review was performed and was negative except for the pertinent positives listed in the HPI.  Physical Exam:  Vital signs in last 24 hours: Temp:  [97.6 F (36.4 C)] 97.6 F (36.4 C) (05/31 1142) Pulse Rate:  [57-111] 111 (05/31 1530) Resp:  [14-17] 16 (05/31 1530) BP: (110-151)/(82-101) 150/90 mmHg (05/31 1530) SpO2:  [93 %-98 %] 93 % (05/31 1530) Physical Exam Constitutional: NAD, pleasant and answers questions appropriately Head: Normocephalic and atraumatic.  Pulmonary/Chest: No increased work of breathing on room air. Abdominal: S/NT/ND. No CVA tenderness. Neurological: He is alert and oriented to person, place, and time.  Psychiatric: He has a normal mood and affect. His behavior is normal.   Laboratory Data:  Results for orders placed or performed during the hospital encounter of 07/22/15 (from the past 72 hour(s))  POC occult blood, ED     Status: Abnormal   Collection Time: 07/22/15 12:05 PM  Result Value Ref Range   Fecal Occult Bld POSITIVE (A) NEGATIVE  Blood Culture (routine x 2)     Status: None (Preliminary result)   Collection Time: 07/22/15  1:06 PM  Result Value Ref Range   Specimen Description BLOOD LEFT HAND    Special Requests      BOTTLES DRAWN AEROBIC AND ANAEROBIC 5CC Performed at [facility]    Culture PENDING    Report Status PENDING   Comprehensive metabolic panel     Status: Abnormal   Collection Time: 07/22/15  1:14 PM  Result Value Ref Range   Sodium 139 135 - 145 mmol/L   Potassium 3.8 3.5 - 5.1 mmol/L   Chloride 106 101 - 111 mmol/L   CO2 26 22 - 32 mmol/L   Glucose, Bld 115 (H) 65 - 99 mg/dL   BUN 23 (H) 6 -  20 mg/dL   Creatinine, Ser 1.08 0.61 - 1.24 mg/dL   Calcium 9.4 8.9 - 10.3 mg/dL   Total Protein 6.9 6.5 - 8.1 g/dL   Albumin 4.1 3.5 - 5.0 g/dL   AST 18 15 - 41 U/L   ALT 14 (L) 17 - 63 U/L   Alkaline Phosphatase 61 38 - 126 U/L   Total Bilirubin 1.1 0.3 - 1.2 mg/dL   GFR calc non Af Amer >60 >60 mL/min   GFR calc Af Amer >60 >60 mL/min    Comment: (NOTE) The eGFR has been calculated using the CKD EPI equation. This calculation has not been validated in all clinical situations. eGFR's persistently <60 mL/min signify possible Chronic Kidney Disease.    Anion gap 7 5 - 15  CBC WITH DIFFERENTIAL     Status: Abnormal   Collection Time:  07/22/15  1:14 PM  Result Value Ref Range   WBC 12.4 (H) 4.0 - 10.5 K/uL   RBC 5.13 4.22 - 5.81 MIL/uL   Hemoglobin 15.1 13.0 - 17.0 g/dL   HCT 45.4 39.0 - 52.0 %   MCV 88.5 78.0 - 100.0 fL   MCH 29.4 26.0 - 34.0 pg   MCHC 33.3 30.0 - 36.0 g/dL   RDW 14.6 11.5 - 15.5 %   Platelets 211 150 - 400 K/uL   Neutrophils Relative % 81 %   Lymphocytes Relative 10 %   Monocytes Relative 7 %   Eosinophils Relative 2 %   Basophils Relative 0 %   Neutro Abs 10.1 (H) 1.7 - 7.7 K/uL   Lymphs Abs 1.2 0.7 - 4.0 K/uL   Monocytes Absolute 0.9 0.1 - 1.0 K/uL   Eosinophils Absolute 0.2 0.0 - 0.7 K/uL   Basophils Absolute 0.0 0.0 - 0.1 K/uL   Smear Review MORPHOLOGY UNREMARKABLE   Blood Culture (routine x 2)     Status: None (Preliminary result)   Collection Time: 07/22/15  1:14 PM  Result Value Ref Range   Specimen Description BLOOD LEFT ANTECUBITAL    Special Requests      BOTTLES DRAWN AEROBIC AND ANAEROBIC 5CC Performed at [facility]    Culture PENDING    Report Status PENDING   Type and screen     Status: None   Collection Time: 07/22/15  1:14 PM  Result Value Ref Range   ABO/RH(D) A POS    Antibody Screen NEG    Sample Expiration 07/25/2015   ABO/Rh     Status: None   Collection Time: 07/22/15  1:14 PM  Result Value Ref Range    ABO/RH(D) A POS   I-Stat CG4 Lactic Acid, ED  (not at  [facility])     Status: None   Collection Time: 07/22/15  1:26 PM  Result Value Ref Range   Lactic Acid, Venous 1.77 0.5 - 2.0 mmol/L  I-Stat CG4 Lactic Acid, ED  (not at  [facility])     Status: None   Collection Time: 07/22/15  3:36 PM  Result Value Ref Range   Lactic Acid, Venous 0.67 0.5 - 2.0 mmol/L   Recent Results (from the past 240 hour(s))  Blood Culture (routine x 2)     Status: None (Preliminary result)   Collection Time: 07/22/15  1:06 PM  Result Value Ref Range Status   Specimen Description BLOOD LEFT HAND  Final   Special Requests   Final    BOTTLES DRAWN AEROBIC AND ANAEROBIC 5CC Performed at [facility]    Culture PENDING  Incomplete   Report Status PENDING  Incomplete  Blood Culture (routine x 2)     Status: None (Preliminary result)   Collection Time: 07/22/15  1:14 PM  Result Value Ref Range Status   Specimen Description BLOOD LEFT ANTECUBITAL  Final   Special Requests   Final    BOTTLES DRAWN AEROBIC AND ANAEROBIC 5CC Performed at [facility]    Culture PENDING  Incomplete   Report Status PENDING  Incomplete   Creatinine:  Recent Labs  07/22/15 1314  CREATININE 1.08    Impression/Assessment:  82 yo M with complex GU history as noted above 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 Several episodes of AMS and a fall sicne 5/20 concerning for possible UTI. UCx from 5/22 with ESBL sensitive only to IV Abx. Admitted to hospitalist  for MDR UTI requiring IV Abx and get him a PICC line and set him up for IV Abx at home.  Plan:  - Will defer to hospitalist for selection, dosing and duration of antibiotics for MDR UTI with indwelling catheter - Could consider cefepime 2 g q12h x 10 days  - Will need PICC line for home IV ABx - Will need home infusion and HH RN to assist with IV Abx at home - Follow up as previously planned for next foley catheter  exchange  Acie Fredrickson 07/22/2015, 4:23 PM

## 2015-07-22 NOTE — Progress Notes (Addendum)
ED CM consult for  FYI-PATIENT RECEIVES HOME HEALTH-RN, PT, OT-WIFE AND PATIENT UNSURE OF NAME  With EPIC chart review CM noted pt recently d/c from [place] to [facility] on 06/03/15 Pt with [facility] admissions x 2 and 1 ED in last 6 months  Pt was d/c from [facility] for short term rehab on 06/16/15   Cm left messages for [facility], [facility] and [facility] to see who pt is active with   [address] [facility] confirms pt is active with [facility] at Informed her of pt admission to [facility]

## 2015-07-22 NOTE — ED Notes (Signed)
Pt BIB daughter and wife.  Pt "can't have antibiotics a lot" because he is resistant to so many.  Pt was dx with UTI and left it up to the family whether or not they wanted to treat him with abx.  Family decided against it since he was acting normally.  Pt began to decline in mental status last night, became confused, angry, and weak.  Also began having rectal bleeding.  Pt has lt lower back pain that family thinks may be from a fall 2 wks ago.

## 2015-07-22 NOTE — ED Provider Notes (Signed)
CSN: [id number]     Arrival date & time 07/22/15  1134 History   First MD Initiated Contact with Patient 07/22/15 1136     Chief Complaint  Patient presents with  . Rectal Bleeding  . Urinary Tract Infection     (Consider location/radiation/quality/duration/timing/severity/associated sxs/prior Treatment) Patient is a 82 y.o. male presenting with hematochezia and urinary tract infection.  Rectal Bleeding Quality:  Bright red Amount:  Copious Duration:  1 day Timing:  Rare Progression:  Resolved Chronicity:  New Similar prior episodes: no   Relieved by:  Nothing Worsened by:  Nothing tried Ineffective treatments:  None tried Associated symptoms: no abdominal pain, no fever and no vomiting   Urinary Tract Infection Pertinent negatives include no chest pain, no abdominal pain, no headaches and no shortness of breath.   82 yo M With a chief complaint of altered mental status. Per the family he's been acting severely confused and hallucinating. This been coming and going. He is recently diagnosed with a urinary tract infection at the urology clinic 2 days ago. He had a severely resistant strain of Escherichia coli and it was discussed that they would not start antibiotics unless needed. They called their urologist this morning is just that they come to the emergency department for admission and PICC line placement. He also had one episode of bright red blood mixed in with the stool. He had one episode of this. They deny any further episodes. No noted dark stool or tarry stool. No history of similar. Takes aspirin.   Past Medical History  Diagnosis Date  . Atrial fibrillation ([place])   . Hyperlipidemia   . Hypertension   . UTI (urinary tract infection)   . Dementia   . Bladder cancer (H[facility])     hx of surgery and chemo  . Lung cancer [facility])     Status post surgery resection  . Diverticulitis   . Depression 08/19/2014   Past Surgical History  Procedure Laterality Date  . Hernia repair   2006  . Cataract extraction, bilateral  1998, 2000  . Cystectomy  2002  . Lung removal, partial     Family History  Problem Relation Age of Onset  . Cancer Maternal Aunt     unknown  . Cancer Maternal Grandmother     unknown  . Aneurysm Brother   . Stroke Father     cerebral hemorrhage   Social History  Substance Use Topics  . Smoking status: Former Smoker    Types: Cigarettes    Quit date: 10/10/1991  . Smokeless tobacco: Never Used  . Alcohol Use: No    Review of Systems  Constitutional: Negative for fever and chills.  HENT: Negative for congestion and facial swelling.   Eyes: Negative for discharge and visual disturbance.  Respiratory: Negative for shortness of breath.   Cardiovascular: Negative for chest pain and palpitations.  Gastrointestinal: Positive for blood in stool and hematochezia. Negative for vomiting, abdominal pain and diarrhea.  Musculoskeletal: Negative for myalgias and arthralgias.  Skin: Negative for color change and rash.  Neurological: Negative for tremors, syncope and headaches.  Psychiatric/Behavioral: Negative for confusion and dysphoric mood.      Allergies  Sulfa antibiotics and Sulfonamide derivatives  Home Medications   Prior to Admission medications   Medication Sig Start Date End Date Taking? Authorizing Provider  ALPHA LIPOIC ACID PO Take 200 mg by mouth every morning.    Yes Historical Provider, MD  ascorbic acid (VITAMIN C) 1000 MG tablet Take  1,000 mg by mouth every morning.    Yes Historical Provider, MD  aspirin EC 81 MG tablet Take 81 mg by mouth daily with breakfast.   Yes Historical Provider, MD  atorvastatin (LIPITOR) 10 MG tablet Take 10 mg by mouth at bedtime.    Yes Historical Provider, MD  buPROPion (WELLBUTRIN) 75 MG tablet Take 75 mg by mouth daily with breakfast.    Yes Historical Provider, MD  calcium carbonate (OS-CAL - DOSED IN MG OF ELEMENTAL CALCIUM) 1250 MG tablet Take 1 tablet by mouth daily with breakfast.     Yes Historical Provider, MD  Cholecalciferol (EQL VITAMIN D3) 1000 units tablet Take 1,000 Units by mouth every morning.   Yes Historical Provider, MD  Cyanocobalamin (VITAMIN B 12 PO) Take 50 mcg by mouth every morning.    Yes Historical Provider, MD  hydrocortisone valerate cream (WESTCORT) 0.2 % Apply 1 application topically 2 (two) times daily as needed (itching).    Yes Historical Provider, MD  ibuprofen (ADVIL,MOTRIN) 400 MG tablet Take 400 mg by mouth every 6 (six) hours as needed for moderate pain.   Yes Historical Provider, MD  MELATONIN PO Take 3 mg by mouth at bedtime as needed (sleep).    Yes Historical Provider, MD  methenamine (HIPREX) 1 G tablet Take 1 g by mouth 2 (two) times daily.    Yes Historical Provider, MD  metoprolol succinate (TOPROL-XL) 25 MG 24 hr tablet Take 25 mg by mouth daily with breakfast.    Yes Historical Provider, MD  nitrofurantoin (MACRODANTIN) 100 MG capsule Take 1 capsule (100 mg total) by mouth every evening. Resume 4/18 after antibiotics are done. Patient taking differently: Take 100 mg by mouth at bedtime.  06/09/15  Yes Orson Eva, MD  saccharomyces boulardii (FLORASTOR) 250 MG capsule Take 1 capsule (250 mg total) by mouth 2 (two) times daily. Patient taking differently: Take 250 mg by mouth daily.  06/02/15  Yes Eugenie Filler, MD  sertraline (ZOLOFT) 100 MG tablet Take 100 mg by mouth daily with breakfast.    Yes Historical Provider, MD   BP 148/84 mmHg  Pulse 65  Temp(Src) 97.6 F (36.4 C) (Oral)  Resp 17  SpO2 98% Physical Exam  Constitutional: He is oriented to person, place, and time. He appears well-developed and well-nourished.  HENT:  Head: Normocephalic and atraumatic.  Eyes: EOM are normal. Pupils are equal, round, and reactive to light.  Neck: Normal range of motion. Neck supple. No JVD present.  Cardiovascular: Normal rate and regular rhythm.  Exam reveals no gallop and no friction rub.   No murmur heard. Pulmonary/Chest: No  respiratory distress. He has no wheezes.  Abdominal: He exhibits no distension. There is no rebound and no guarding.  Musculoskeletal: Normal range of motion.  Neurological: He is alert and oriented to person, place, and time.  Skin: No rash noted. No pallor.  Psychiatric: He has a normal mood and affect. His behavior is normal.  Nursing note and vitals reviewed.   ED Course  Procedures (including critical care time) Labs Review Labs Reviewed  COMPREHENSIVE METABOLIC PANEL - Abnormal; Notable for the following:    Glucose, Bld 115 (*)    BUN 23 (*)    ALT 14 (*)    All other components within normal limits  CBC WITH DIFFERENTIAL/PLATELET - Abnormal; Notable for the following:    WBC 12.4 (*)    Neutro Abs 10.1 (*)    All other components within normal limits  POC  OCCULT BLOOD, ED - Abnormal; Notable for the following:    Fecal Occult Bld POSITIVE (*)    All other components within normal limits  CULTURE, BLOOD (ROUTINE X 2)  CULTURE, BLOOD (ROUTINE X 2)  URINE CULTURE  URINALYSIS, ROUTINE W REFLEX MICROSCOPIC (NOT AT [facility])  OCCULT BLOOD X 1 CARD TO LAB, STOOL  I-STAT CG4 LACTIC ACID, ED  I-STAT CG4 LACTIC ACID, ED  TYPE AND SCREEN  ABO/RH    Imaging Review Dg Chest 2 View  07/22/2015  CLINICAL DATA:  Back pain.  Altered mental status. EXAM: CHEST  2 VIEW COMPARISON:  05/30/2015 chest radiograph. FINDINGS: Surgical clips and sutures overlie the left parahilar region. Stable cardiomediastinal silhouette with top-normal heart size. No pneumothorax. No pleural effusion. Lungs appear clear, with no acute consolidative airspace disease and no pulmonary edema. IMPRESSION: No active cardiopulmonary disease. Electronically Signed   By: Ilona Sorrel M.D.   On: 07/22/2015 12:15   Ct Head Wo Contrast  07/22/2015  CLINICAL DATA:  Confusion EXAM: CT HEAD WITHOUT CONTRAST TECHNIQUE: Contiguous axial images were obtained from the base of the skull through the vertex without intravenous  contrast. COMPARISON:  06/23/2015 FINDINGS: Bony calvarium is intact. No gross soft tissue abnormality is noted. Paranasal sinuses are well aerated. Diffuse atrophic changes are again seen as are chronic white matter ischemic changes. These are stable in appearance from the prior exam. No findings to suggest acute hemorrhage, acute infarction or space-occupying mass lesion are noted. IMPRESSION: Chronic atrophic and ischemic changes without acute intracranial abnormality. Electronically Signed   By: Inez Catalina M.D.   On: 07/22/2015 12:36   I have personally reviewed and evaluated these images and lab results as part of my medical decision-making.   EKG Interpretation   Date/Time:  Wednesday Jul 22 2015 12:41:00 EDT Ventricular Rate:  65 PR Interval:    QRS Duration: 99 QT Interval:  430 QTC Calculation: 447 R Axis:   -20 Text Interpretation:  Atrial fibrillation Inferior infarct, old  Anteroseptal infarct, old No significant change since last tracing  Confirmed by Ammarie Matsuura MD, DANIEL [phone]) on 07/22/2015 1:20:44 PM      MDM   Final diagnoses:  Pyelonephritis    82 yo M with a chief complaints of delirium. This is likely secondary to a UTI. Patient had a culture and sensitivities report with him from his urologist's office. Dr. Diona Fanti would like him started on cefepime. Feels that he would likely need a PICC line to complete his therapy.  The patients results and plan were reviewed and discussed.   Any x-rays performed were independently reviewed by myself.   Differential diagnosis were considered with the presenting HPI.  Medications  ceFEPIme (MAXIPIME) 2 g in dextrose 5 % 50 mL IVPB (not administered)  sodium chloride 0.9 % bolus 1,000 mL (0 mLs Intravenous Stopped 07/22/15 1400)    Filed Vitals:   07/22/15 1238 07/22/15 1240 07/22/15 1352 07/22/15 1400  BP:   143/101 148/84  Pulse: 60  75 65  Temp:      TempSrc:      Resp:  '14 15 17  '$ SpO2: 96%  96% 98%    Final  diagnoses:  Pyelonephritis    Admission/ observation were discussed with the admitting physician, patient and/or family and they are comfortable with the plan.      Deno Etienne, DO 07/22/15 1445

## 2015-07-22 NOTE — H&P (Signed)
History and Physical    [name] BJY:[id number] DOB: 28-Jan-1931 DOA: 07/22/2015  PCP: [name], MD Patient coming from: Home with home health. Patient usually uses a wheelchair as well as a walker but per wife uses more the wheelchair.  Chief Complaint: Confusion/rectal bleeding/UTI  HPI: [name] is a 82 y.o. 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 Per patient's wife patient has had generalized weakness and ongoing confusion for the past 8 days which has worsened over the past 4 days. Per wife patient was seen at the urologist office on 07/13/2015 for Foley catheter to be changed and at that time was noted to be somewhat confused however was intermittent. Urinalysis at that time was done and urine cultures sent. Due to patient's worsening confusion patient's granddaughter called urologist office and was instructed to come by urologist office to get a paper copy of urine culture results and present to the ED. The patient's wife no recent fevers, no chills, no nausea, no vomiting, no chest pain, no shortness of breath, no abdominal pain, no diarrhea, no constipation, no hematemesis, no melena, no cough, no visual changes, no asymmetric weakness or numbness. Patient's wife however states patient had an episode of bright red blood per rectum one day  prior to admission and no bowel movement since.  ED Course: Patient was seen in the ED, compressive metabolic profile obtained was unremarkable. CBC obtained at a white count of 12.4 otherwise was within normal limits. Repeat urinalysis had moderate leukocytes, nitrite positive, 6-30 WBCs, many bacteria and cloudy in appearance. CT head with no acute abnormalities. Chest x-ray was unremarkable. ED physician states that the rectal examination brown stool was noted in the rectal vault. FOBT was positive. ED physician stated he spoke with neurologist who had recommended PICC line placement and patient being placed on IV cefepime due to sensitivities noted. [facility] were called to admit the patient for further evaluation and management.  Review of Systems: As per HPI otherwise 10 point review of systems negative.   Past Medical History  Diagnosis Date  . Atrial fibrillation ([place])   . Hyperlipidemia   . Hypertension   . UTI (urinary tract infection)   . Dementia   . Bladder cancer (HCC)     hx of surgery and chemo  . Lung cancer [facility])     Status post surgery resection  . Diverticulitis   . Depression 08/19/2014    Past Surgical History  Procedure Laterality Date  . Hernia repair  2006  . Cataract extraction, bilateral  1998, 2000  . Cystectomy  2002  . Lung removal, partial       reports that he quit smoking about 23 years ago. His smoking use included Cigarettes. He has never used smokeless tobacco. He reports that he does not drink alcohol or use illicit drugs.  Allergies  Allergen Reactions  . Sulfa Antibiotics Nausea And Vomiting  . Sulfonamide Derivatives Nausea And Vomiting    Family History  Problem Relation Age of Onset  .  Cancer Maternal Aunt     unknown  . Cancer Maternal Grandmother     unknown  . Aneurysm Brother   . Stroke Father     cerebral hemorrhage   Mother deceased age 59 from old age. Father deceased age 56 from a stroke.  Prior to Admission  medications   Medication Sig Start Date End Date Taking? Authorizing Provider  ALPHA LIPOIC ACID PO Take 200 mg by mouth every morning.    Yes Historical Provider, MD  ascorbic acid (VITAMIN C) 1000 MG tablet Take 1,000 mg by mouth every morning.    Yes Historical Provider, MD  aspirin EC 81 MG tablet Take 81 mg by mouth daily with breakfast.   Yes Historical Provider, MD  atorvastatin (LIPITOR) 10 MG tablet Take 10 mg by mouth at bedtime.    Yes Historical Provider, MD  buPROPion (WELLBUTRIN) 75 MG tablet Take 75 mg by mouth daily with breakfast.    Yes Historical Provider, MD  calcium carbonate (OS-CAL - DOSED IN MG OF ELEMENTAL CALCIUM) 1250 MG tablet Take 1 tablet by mouth daily with breakfast.    Yes Historical Provider, MD  Cholecalciferol (EQL VITAMIN D3) 1000 units tablet Take 1,000 Units by mouth every morning.   Yes Historical Provider, MD  Cyanocobalamin (VITAMIN B 12 PO) Take 50 mcg by mouth every morning.    Yes Historical Provider, MD  hydrocortisone valerate cream (WESTCORT) 0.2 % Apply 1 application topically 2 (two) times daily as needed (itching).    Yes Historical Provider, MD  ibuprofen (ADVIL,MOTRIN) 400 MG tablet Take 400 mg by mouth every 6 (six) hours as needed for moderate pain.   Yes Historical Provider, MD  MELATONIN PO Take 3 mg by mouth at bedtime as needed (sleep).    Yes Historical Provider, MD  methenamine (HIPREX) 1 G tablet Take 1 g by mouth 2 (two) times daily.    Yes Historical Provider, MD  metoprolol succinate (TOPROL-XL) 25 MG 24 hr tablet Take 25 mg by mouth daily with breakfast.    Yes Historical Provider, MD  nitrofurantoin (MACRODANTIN) 100 MG capsule Take 1 capsule (100 mg total) by mouth every evening. Resume 4/18 after antibiotics are done. Patient taking differently: Take 100 mg by mouth at bedtime.  06/09/15  Yes Orson Eva, MD  saccharomyces boulardii (FLORASTOR) 250 MG capsule Take 1 capsule (250 mg total) by mouth 2 (two) times daily. Patient taking  differently: Take 250 mg by mouth daily.  06/02/15  Yes Eugenie Filler, MD  sertraline (ZOLOFT) 100 MG tablet Take 100 mg by mouth daily with breakfast.    Yes Historical Provider, MD    Physical Exam: Filed Vitals:   07/22/15 1352 07/22/15 1400 07/22/15 1443 07/22/15 1530  BP: 143/101 148/84 141/85 150/90  Pulse: 75 65 83 111  Temp:      TempSrc:      Resp: '15 17 16 16  '$ SpO2: 96% 98% 93% 93%      Constitutional: NAD, calm, comfortable Filed Vitals:   07/22/15 1352 07/22/15 1400 07/22/15 1443 07/22/15 1530  BP: 143/101 148/84 141/85 150/90  Pulse: 75 65 83 111  Temp:      TempSrc:      Resp: '15 17 16 16  '$ SpO2: 96% 98% 93% 93%   Eyes: PERRLA, lids and conjunctivae normal ENMT: Mucous membranes are extremely dry. Posterior pharynx clear of any exudate or lesions.Normal dentition.  Neck: normal, supple, no masses, no thyromegaly Respiratory: clear to auscultation bilaterally, no wheezing,  no crackles in the anterior lung fields. Normal respiratory effort. No accessory muscle use.  Cardiovascular: Irregularly irregular. No extremity edema. 2+ pedal pulses. No carotid bruits.  Abdomen: no tenderness, no masses palpated. No hepatosplenomegaly. Bowel sounds positive.  Musculoskeletal: no clubbing / cyanosis. No joint deformity upper and lower extremities. Good ROM, no contractures. Normal muscle tone.  Skin: no rashes, lesions. No induration Neurologic: CN 2-12 grossly intact. Sensation intact, DTR normal. Strength 5/5 in all 4.  Psychiatric: Alert and oriented to self and place. Patient thinks his 22. Patient states the president is President Trump.   Labs on Admission: I have personally reviewed following labs and imaging studies  CBC:  Recent Labs Lab 07/22/15 1314  WBC 12.4*  NEUTROABS 10.1*  HGB 15.1  HCT 45.4  MCV 88.5  PLT 353   Basic Metabolic Panel:  Recent Labs Lab 07/22/15 1314  NA 139  K 3.8  CL 106  CO2 26  GLUCOSE 115*  BUN 23*  CREATININE  1.08  CALCIUM 9.4   GFR: CrCl cannot be calculated (Unknown ideal weight.). Liver Function Tests:  Recent Labs Lab 07/22/15 1314  AST 18  ALT 14*  ALKPHOS 61  BILITOT 1.1  PROT 6.9  ALBUMIN 4.1   No results for input(s): LIPASE, AMYLASE in the last 168 hours. No results for input(s): AMMONIA in the last 168 hours. Coagulation Profile: No results for input(s): INR, PROTIME in the last 168 hours. Cardiac Enzymes: No results for input(s): CKTOTAL, CKMB, CKMBINDEX, TROPONINI in the last 168 hours. BNP (last 3 results) No results for input(s): PROBNP in the last 8760 hours. HbA1C: No results for input(s): HGBA1C in the last 72 hours. CBG: No results for input(s): GLUCAP in the last 168 hours. Lipid Profile: No results for input(s): CHOL, HDL, LDLCALC, TRIG, CHOLHDL, LDLDIRECT in the last 72 hours. Thyroid Function Tests: No results for input(s): TSH, T4TOTAL, FREET4, T3FREE, THYROIDAB in the last 72 hours. Anemia Panel: No results for input(s): VITAMINB12, FOLATE, FERRITIN, TIBC, IRON, RETICCTPCT in the last 72 hours. Urine analysis:    Component Value Date/Time   COLORURINE YELLOW 05/30/2015 2021   APPEARANCEUR TURBID* 05/30/2015 2021   LABSPEC 1.017 05/30/2015 2021   PHURINE 6.0 05/30/2015 2021   GLUCOSEU NEGATIVE 05/30/2015 2021   HGBUR LARGE* 05/30/2015 2021   BILIRUBINUR NEGATIVE 05/30/2015 2021   KETONESUR NEGATIVE 05/30/2015 2021   PROTEINUR 30* 05/30/2015 2021   UROBILINOGEN 0.2 11/07/2014 1502   NITRITE POSITIVE* 05/30/2015 2021   LEUKOCYTESUR LARGE* 05/30/2015 2021   Sepsis Labs: !!!!!!!!!!!!!!!!!!!!!!!!!!!!!!!!!!!!!!!!!!!! '@LABRCNTIP'$ (procalcitonin:4,lacticidven:4) ) Recent Results (from the past 240 hour(s))  Blood Culture (routine x 2)     Status: None (Preliminary result)   Collection Time: 07/22/15  1:06 PM  Result Value Ref Range Status   Specimen Description BLOOD LEFT HAND  Final   Special Requests   Final    BOTTLES DRAWN AEROBIC AND ANAEROBIC  5CC Performed at [facility]    Culture PENDING  Incomplete   Report Status PENDING  Incomplete  Blood Culture (routine x 2)     Status: None (Preliminary result)   Collection Time: 07/22/15  1:14 PM  Result Value Ref Range Status   Specimen Description BLOOD LEFT ANTECUBITAL  Final   Special Requests   Final    BOTTLES DRAWN AEROBIC AND ANAEROBIC 5CC Performed at [facility]    Culture PENDING  Incomplete   Report Status PENDING  Incomplete     Radiological Exams on Admission: Dg Chest  2 View  07/22/2015  CLINICAL DATA:  Back pain.  Altered mental status. EXAM: CHEST  2 VIEW COMPARISON:  05/30/2015 chest radiograph. FINDINGS: Surgical clips and sutures overlie the left parahilar region. Stable cardiomediastinal silhouette with top-normal heart size. No pneumothorax. No pleural effusion. Lungs appear clear, with no acute consolidative airspace disease and no pulmonary edema. IMPRESSION: No active cardiopulmonary disease. Electronically Signed   By: Ilona Sorrel M.D.   On: 07/22/2015 12:15   Ct Head Wo Contrast  07/22/2015  CLINICAL DATA:  Confusion EXAM: CT HEAD WITHOUT CONTRAST TECHNIQUE: Contiguous axial images were obtained from the base of the skull through the vertex without intravenous contrast. COMPARISON:  06/23/2015 FINDINGS: Bony calvarium is intact. No gross soft tissue abnormality is noted. Paranasal sinuses are well aerated. Diffuse atrophic changes are again seen as are chronic white matter ischemic changes. These are stable in appearance from the prior exam. No findings to suggest acute hemorrhage, acute infarction or space-occupying mass lesion are noted. IMPRESSION: Chronic atrophic and ischemic changes without acute intracranial abnormality. Electronically Signed   By: Inez Catalina M.D.   On: 07/22/2015 12:36    EKG: Atrial fibrillation  Assessment/Plan Principal Problem:   E. coli UTI Active Problems:   Encephalopathy, metabolic   Malignant neoplasm  of bladder (HCC)   HLD (hyperlipidemia)   HYPERTENSION, BENIGN   Lung cancer (HCC)   Atrial fibrillation (HCC)   GERD (gastroesophageal reflux disease)   Pyelonephritis   Leukocytosis   Dehydration   Rectal bleeding    #1 Escherichia coli UTI Per urine cultures and sensitivities per copy which was sent with patient from urologist office. Escherichia coli was sensitive to Pippercillin-tazobactam, cefepime, gentamicin, tobramycin. Repeat urinalysis has been done and cultures pending. Will place a PICC line in place patient on IV cefepime the urologist recommendations and treat for total of 10 days. Urology will be consulted.  #2 acute metabolic encephalopathy Clinical improvement noted when I assessed the patient. Secondary to problem #1. Place on IV cefepime. IV fluids. Supportive care.  #3 atrial fibrillation Currently rate controlled. Continue metoprolol for rate control. Hold aspirin.  #4 rectal bleeding Likely a hemorrhoidal bleed. No overt bleeding. FOBT is positive. H&H is stable. Will hold aspirin for now. Monitor. If patient with further rectal bleeding a significant drop in hemoglobin will likely need to consult with GI for further evaluation and management. Will monitor for now.  #5 gastroesophageal reflux disease Placed on a PPI.  #6 hypertension Stable. Continue Toprol-XL.  #7 dehydration IV fluids.  #8 dementia Continue home medications.  #9 depression Continue Zoloft and Wellbutrin.  #10 hyperlipidemia Continue Lipitor.   DVT prophylaxis: SCDs Code Status: DO NOT RESUSCITATE Family Communication: Updated patient and wife at bedside. Disposition Plan: Pending PT evaluation. Likely home with home health therapies once medically stable. Consults called: Urology: Dr.Dahlstedt Admission status: Inpatient. Telemetry.   [facility] MD [facility] Pager [phone]  If 7PM-7AM, please contact night-coverage [email or web address] Password  [facility]  07/22/2015, 4:24 PM

## 2015-07-22 NOTE — Progress Notes (Signed)
PHARMACIST - PHYSICIAN ORDER COMMUNICATION  CONCERNING: P&T Medication Policy on Herbal Medications  DESCRIPTION:  This patient's order for: alpha lipoic acid   has been noted.  This product(s) is classified as an "herbal" or natural product. Due to a lack of definitive safety studies or FDA approval, nonstandard manufacturing practices, plus the potential risk of unknown drug-drug interactions while on inpatient medications, the Pharmacy and Therapeutics Committee does not permit the use of "herbal" or natural products of this type within [facility].   ACTION TAKEN: The pharmacy department is unable to verify this order at this time and your patient has been informed of this safety policy. Please reevaluate patient's clinical condition at discharge and address if the herbal or natural product(s) should be resumed at that time.

## 2015-07-23 ENCOUNTER — Inpatient Hospital Stay (HOSPITAL_COMMUNITY): Payer: Medicare Other

## 2015-07-23 LAB — BASIC METABOLIC PANEL
Anion gap: 5 (ref 5–15)
BUN: 20 mg/dL (ref 6–20)
CO2: 26 mmol/L (ref 22–32)
Calcium: 8.9 mg/dL (ref 8.9–10.3)
Chloride: 108 mmol/L (ref 101–111)
Creatinine, Ser: 0.95 mg/dL (ref 0.61–1.24)
GFR calc Af Amer: >60 mL/min (ref >60)
GLUCOSE: 105 mg/dL — AB (ref 65–99)
POTASSIUM: 3.8 mmol/L (ref 3.5–5.1)
Sodium: 139 mmol/L (ref 135–145)

## 2015-07-23 LAB — CBC
HEMATOCRIT: 41.9 % (ref 39.0–52.0)
Hemoglobin: 13.4 g/dL (ref 13.0–17.0)
MCH: 28.9 pg (ref 26.0–34.0)
MCHC: 32 g/dL (ref 30.0–36.0)
MCV: 90.5 fL (ref 78.0–100.0)
Platelets: 187 10*3/uL (ref 150–400)
RBC: 4.63 MIL/uL (ref 4.22–5.81)
RDW: 14.7 % (ref 11.5–15.5)
WBC: 14.3 10*3/uL — ABNORMAL HIGH (ref 4.0–10.5)

## 2015-07-23 LAB — TSH: TSH: 1.983 u[IU]/mL (ref 0.350–4.500)

## 2015-07-23 MED ORDER — IPRATROPIUM-ALBUTEROL 0.5-2.5 (3) MG/3ML IN SOLN: 3.0000 mL | Freq: Four times a day (QID) | RESPIRATORY_TRACT | Status: DC | PRN

## 2015-07-23 MED ORDER — FOSFOMYCIN TROMETHAMINE 3 G PO PACK
3.0000 g | PACK | ORAL | Status: DC
  Filled 2015-07-23: qty 3

## 2015-07-23 MED ORDER — HEPARIN SODIUM (PORCINE) 5000 UNIT/ML IJ SOLN
5000.0000 [IU] | Freq: Three times a day (TID) | INTRAMUSCULAR | Status: DC
  Administered 2015-07-23 – 2015-07-24 (×4): 5000 [IU] via SUBCUTANEOUS
  Filled 2015-07-23 (×3): qty 1

## 2015-07-23 MED ORDER — ASPIRIN 81 MG PO CHEW
81.0000 mg | CHEWABLE_TABLET | Freq: Every day | ORAL | Status: DC
  Administered 2015-07-23 – 2015-07-24 (×2): 81 mg via ORAL
  Filled 2015-07-23 (×2): qty 1

## 2015-07-23 MED ORDER — SODIUM CHLORIDE 0.9% FLUSH
10.0000 mL | INTRAVENOUS | Status: DC | PRN
  Administered 2015-07-24: 10 mL
  Filled 2015-07-23: qty 40

## 2015-07-23 NOTE — Care Management Note (Signed)
Case Management Note  Patient Details  Name: [name] MRN: [id number] Date of Birth: [birth year]  Subjective/Objective:  82 y/o m admitted w/UTI. Hx: DNR, recurrent UTI's. Active w/[facility] HHRN/PT/OT-rep Abby aware-please order @ d/c. PICC today. Anticipate long term iv abx-[facility] chosen by granddaughter for home iv infusion-used them in past-rep Pam aware. Await HHRN-iv abx,if lab draws needed please indicate, flush-dose freq,duration, & script-await orders.Granddaughter-Lindsay c#[phone],states she will be leaving tomorrow;her spouse Jeanell Sparrow will be the cotnact for home infusion teaching c#[phone]. Family will transport home on own.                 Action/Plan:d/c home w/HHC.   Expected Discharge Date:   (UNKNOWN)               Expected Discharge Plan:  [place]  In-House Referral:     Discharge planning Services  CM Consult  Post Acute Care Choice:  Home Health (Active w/[facility]-RN/PT/OT) Choice offered to:  Adult Children  DME Arranged:    DME Agency:     HH Arranged:  IV Antibiotics HH Agency:  [place]  Status of Service:  In process, will continue to follow  Medicare Important Message Given:    Date Medicare IM Given:    Medicare IM give by:    Date Additional Medicare IM Given:    Additional Medicare Important Message give by:     If discussed at [place] of Stay Meetings, dates discussed:    Additional Comments:  [name], RN 07/23/2015, 12:59 PM

## 2015-07-23 NOTE — Evaluation (Signed)
Physical Therapy Evaluation Patient Details Name: [name] MRN: [id number] DOB: [birth year] Today's Date: 07/23/2015   History of Present Illness  [name] is a 82 y.o. 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  Clinical Impression  Pt admitted with above diagnosis. Pt currently with functional limitations due to the deficits listed below (see PT Problem List).  Pt will benefit from skilled PT to increase their independence and safety with mobility to allow discharge to the venue listed below.  Pt assisted OOB to ambulate short distance in room to recliner.  Pt requiring at least mod assist due to balance impairments.  Pt able to answer questions however cognition appears impaired.  Recommend SNF if family does not feel able to provide current level of care.  Per chart, pt typically using w/c for mobility.     Follow Up Recommendations SNF;Supervision/Assistance - 24 hour    Equipment Recommendations  None recommended by PT    Recommendations for Other Services       Precautions / Restrictions Precautions Precautions: Fall Restrictions Weight Bearing Restrictions: No      Mobility  Bed Mobility Overal bed mobility: Needs Assistance;+ 2 for safety/equipment Bed Mobility: Supine to Sit     Supine to sit: Mod assist;HOB elevated;+2 for  safety/equipment     General bed mobility comments: needed assistance and cues for all aspects, increased assistance and time to scoot to EOB.  Transfers Overall transfer level: Needs assistance Equipment used: Rolling walker (2 wheeled) Transfers: Sit to/from Stand Sit to Stand: Mod assist;+2 physical assistance;+2 safety/equipment         General transfer comment: needed assistance to rise, stabilize, leaning to right and posteriorly initially. Needed cues hand placement and to correct BOS  Ambulation/Gait Ambulation/Gait assistance: Mod assist;+2 safety/equipment Ambulation Distance (Feet): 7 Feet Assistive device: Rolling walker (2 wheeled) Gait Pattern/deviations: Step-through pattern;Decreased stride length     General Gait Details: multimodal cues for using RW, maintaining center BOS, assisted with ambulating short distance in room to recliner (per chart review and pt, typically uses w/c and RW)  Stairs            Wheelchair Mobility    Modified Rankin (Stroke Patients Only)       Balance Overall balance assessment: Needs assistance Sitting-balance support: Bilateral upper extremity supported;Feet supported Sitting balance-Leahy Scale: Poor     Standing balance support: Bilateral upper extremity supported Standing balance-Leahy Scale: Zero Standing balance comment: max assist for maintaining balance upon standing, requires assist to correct                             Pertinent Vitals/Pain Pain Assessment: 0-10 Pain Score: 6  Pain Location: back Pain Descriptors / Indicators: Constant Pain Intervention(s): Limited activity within patient's tolerance;Monitored during session;Repositioned    Home Living Family/patient expects to be discharged to:: Unsure Living Arrangements: Other relatives               Additional Comments:  no family available to confirm    Prior Function Level of Independence: Needs assistance   Gait /  Transfers Assistance Needed: walker vs w/c but per chart mostly w/c  ADL's / Homemaking Assistance Needed: granddaughter assists pt with showers, pt independent with getting dressed, granddaughter does cooking/cleaning (from previous admission)        Hand Dominance   Dominant Hand: Right    Extremity/Trunk Assessment   Upper Extremity Assessment: Overall WFL for tasks assessed           Lower Extremity Assessment: Generalized weakness         Communication   Communication: HOH  Cognition Arousal/Alertness: Awake/alert Behavior During Therapy: WFL for tasks assessed/performed Overall Cognitive Status: Impaired/Different from baseline Area of Impairment: Orientation;Memory;Safety/judgement;Following commands;Problem solving Orientation Level: Place;Time   Memory: Decreased short-term memory Following Commands: Follows one step commands inconsistently Safety/Judgement: Decreased awareness of safety;Decreased awareness of deficits   Problem Solving: Decreased initiation;Slow processing      General Comments      Exercises        Assessment/Plan    PT Assessment Patient needs continued PT services  PT Diagnosis Difficulty walking;Generalized weakness   PT Problem List Decreased strength;Decreased activity tolerance;Decreased balance;Decreased mobility;Decreased safety awareness;Decreased knowledge of use of DME  PT Treatment Interventions DME instruction;Gait training;Functional mobility training;Patient/family education;Therapeutic activities;Therapeutic exercise;Wheelchair mobility training;Balance training   PT Goals (Current goals can be found in the Care Plan section) Acute Rehab PT Goals Patient Stated Goal: none stated PT Goal Formulation: With patient Time For Goal Achievement: 08/06/15 Potential to Achieve Goals: Good    Frequency Min 3X/week   Barriers to discharge        Co-evaluation   Reason for Co-Treatment: For patient/therapist  safety PT goals addressed during session: Mobility/safety with mobility OT goals addressed during session: ADL's and self-care       End of Session Equipment Utilized During Treatment: Gait belt Activity Tolerance: Patient tolerated treatment well Patient left: in chair;with call bell/phone within reach;with chair alarm set Nurse Communication: Mobility status         Time: 8115-7262 PT Time Calculation (min) (ACUTE ONLY): 18 min   Charges:   PT Evaluation $PT Eval Moderate Complexity: 1 Procedure     PT G Codes:        [name],[name] 07/23/2015, 12:59 PM [name], PT, DPT 07/23/2015 Pager: [phone]

## 2015-07-23 NOTE — NC FL2 (Signed)
[place] MEDICAID FL2 LEVEL OF CARE SCREENING TOOL     IDENTIFICATION  Patient Name: [name] Birthdate: Jan 17, 1931 Sex: male Admission Date (Current Location): 07/22/2015  [facility] and [place] Number:  Herbalist and Address:  [facility] [place] [facility],  [place] [address], [place]      Provider Number: [id number]  Attending Physician Name and Address:  Thurnell Lose, MD  Relative Name and Phone Number:       Current Level of Care: Hospital Recommended Level of Care: [place] Prior Approval Number:    Date Approved/Denied:   PASRR Number: [id number]  Discharge Plan: SNF    Current Diagnoses: Patient Active Problem List   Diagnosis Date Noted  . Pyelonephritis 07/22/2015  . E. coli UTI 07/22/2015  . Leukocytosis 07/22/2015  . Dehydration 07/22/2015  . Rectal bleeding 07/22/2015  . Pressure ulcer 05/31/2015  . Hip pain   . Encephalopathy, metabolic   . GERD (gastroesophageal reflux disease) 08/19/2014  . Depression 08/19/2014  . UTI (lower urinary tract infection) 08/19/2014  . UTI (urinary tract infection) due to Enterococcus faecium 05/21/2014  . Bradycardia 05/03/2012  . Fall 04/30/2012  . Low back pain 04/30/2012  . Lung cancer ([place]) 10/10/2011  . Atrial fibrillation ([place]) 10/10/2011  . History of stroke 10/10/2011  . Malignant neoplasm of bladder ([place]) 03/13/2008  . HLD (hyperlipidemia) 03/13/2008  . HYPERTENSION, BENIGN 02/13/2008    Orientation RESPIRATION BLADDER Height & Weight     Self, Place  Normal Indwelling catheter Weight: 193 lb 3.2 oz (87.635 kg) Height:  '6\' 1"'$  (185.4 cm)  BEHAVIORAL SYMPTOMS/MOOD NEUROLOGICAL BOWEL NUTRITION STATUS      Continent Diet (Regular)  AMBULATORY STATUS COMMUNICATION OF NEEDS Skin   Extensive Assist Verbally PU Stage and Appropriate Care (PressureUlcer05/31/17StageI-Intactskinwithnon-blanchablerednessofalocalizedareausuallyoverabonyprominence.  (coccyx) & PressureUlcer04/09/17StageII-Partialthicknesslossofdermispresentingasashallowopenulcerwithared,) (sacrum)                       Personal Care Assistance Level of Assistance  Bathing, Feeding, Dressing Bathing Assistance: Limited assistance Feeding assistance: Limited assistance Dressing Assistance: Limited assistance     Functional Limitations [facility]  PT (By licensed PT), OT (By licensed OT)     PT Frequency: 5 OT Frequency: 5            Contractures      Additional Factors Info  Code Status, Allergies, Psychotropic Code Status Info: DNR Allergies Info: Sulfa Antibiotics, Sulfonamide Derivatives           Current Medications (07/23/2015):  This is the current hospital active medication list Current Facility-Administered Medications  Medication Dose Route Frequency Provider Last Rate Last Dose  . 0.9 %  sodium chloride infusion   Intravenous Continuous Thurnell Lose, MD 50 mL/hr at 07/23/15 [phone]    . acetaminophen (TYLENOL) tablet 650 mg  650 mg Oral Q6H PRN Eugenie Filler, MD      . aspirin chewable tablet 81 mg  81 mg Oral Daily Thurnell Lose, MD   81 mg at 07/23/15 1300  . atorvastatin (LIPITOR) tablet 10 mg  10 mg Oral QHS Eugenie Filler, MD   Stopped at 07/22/15 2105  . buPROPion [facility]) tablet 75 mg  75 mg Oral Q breakfast Eugenie Filler, MD   75 mg at 07/23/15 0942  . calcium carbonate (OS-CAL - dosed in mg of elemental calcium) tablet 500 mg of elemental  calcium  1 tablet Oral Q breakfast Irine Seal V, MD   500 mg of elemental calcium at 07/23/15 0941  . ceFEPIme (MAXIPIME) 2 g in dextrose 5 % 50 mL IVPB  2 g Intravenous Q12H Christine E Shade, RPH   2 g at 07/23/15 0415  . cholecalciferol (VITAMIN D) tablet 1,000 Units  1,000 Units Oral q morning - 10a Eugenie Filler, MD   1,000 Units at 07/23/15 0941  . heparin injection 5,000 Units  5,000 Units  Subcutaneous Q8H Thurnell Lose, MD   5,000 Units at 07/23/15 1300  . hydrocortisone valerate cream (WESTCORT) 0.2 % 1 application  1 application Topical BID PRN Eugenie Filler, MD      . ipratropium-albuterol (DUONEB) 0.5-2.5 (3) MG/3ML nebulizer solution 3 mL  3 mL Nebulization Q6H PRN Eugenie Filler, MD      . methenamine (MANDELAMINE) tablet 1,000 mg  1,000 mg Oral BID Eugenie Filler, MD   1,000 mg at 07/23/15 0942  . metoprolol succinate (TOPROL-XL) 24 hr tablet 25 mg  25 mg Oral Q breakfast Eugenie Filler, MD   25 mg at 07/23/15 0941  . ondansetron (ZOFRAN) injection 4 mg  4 mg Intravenous Q6H PRN Eugenie Filler, MD      . oxyCODONE (Oxy IR/ROXICODONE) immediate release tablet 5 mg  5 mg Oral Q4H PRN Ardis Hughs, MD   5 mg at 07/22/15 2311  . pantoprazole (PROTONIX) EC tablet 40 mg  40 mg Oral Q0600 Eugenie Filler, MD   40 mg at 07/23/15 [phone]  . saccharomyces boulardii (FLORASTOR) capsule 250 mg  250 mg Oral Daily Eugenie Filler, MD   250 mg at 07/23/15 0940  . senna-docusate (Senokot-S) tablet 1 tablet  1 tablet Oral QHS PRN Eugenie Filler, MD      . sertraline (ZOLOFT) tablet 100 mg  100 mg Oral Q breakfast Eugenie Filler, MD   100 mg at 07/23/15 0941  . sodium chloride flush (NS) 0.9 % injection 10-40 mL  10-40 mL Intracatheter PRN Thurnell Lose, MD      . sodium chloride flush (NS) 0.9 % injection 3 mL  3 mL Intravenous Q12H Eugenie Filler, MD   3 mL at 07/23/15 1000  . sorbitol 70 % solution 30 mL  30 mL Oral Daily PRN Eugenie Filler, MD      . vitamin B-12 (CYANOCOBALAMIN) tablet 50 mcg  50 mcg Oral q morning - 10a Eugenie Filler, MD   50 mcg at 07/23/15 [phone]  . vitamin C (ASCORBIC ACID) tablet 1,000 mg  1,000 mg Oral q morning - 10a Eugenie Filler, MD   1,000 mg at 07/23/15 0940     Discharge Medications: Please see discharge summary for a list of discharge medications.  Relevant Imaging Results:  Relevant Lab  Results:   Additional Information SSN: [id number]  Standley Brooking, LCSW

## 2015-07-23 NOTE — Care Management Note (Signed)
Case Management Note  Patient Details  Name: [name] MRN: [id number] Date of Birth: [birth year]  Subjective/Objective:  PT recc-SNF. Reginal Lutes agree to faxing out, prefer [facility]-CSW notified.                  Action/Plan:d/c home/snf.   Expected Discharge Date:   (UNKNOWN)               Expected Discharge Plan:  [place]  In-House Referral:     Discharge planning Services  CM Consult  Post Acute Care Choice:  Home Health (Active w/[facility]-RN/PT/OT) Choice offered to:  Adult Children  DME Arranged:    DME Agency:     HH Arranged:  IV Antibiotics HH Agency:  [place]  Status of Service:  In process, will continue to follow  Medicare Important Message Given:    Date Medicare IM Given:    Medicare IM give by:    Date Additional Medicare IM Given:    Additional Medicare Important Message give by:     If discussed at [place] of Stay Meetings, dates discussed:    Additional Comments:  [name], RN 07/23/2015, 1:09 PM

## 2015-07-23 NOTE — Clinical Social Work Placement (Signed)
CSW received message from [facility], Juliann Pulse that patient's grand-daughter, [name] agreed to have information sent out for SNF bed offers. CSW spoke with [name] (cell#: [phone]) re: discharge planning. Grandaughter states that she has discussed with the rest of the family and they have now decided to take patient back home with 24 hour care, which they have had setup for the past 8 months through [facility]. Grand-daughter also states that he has been receiving home health services through [facility]. RNCM, Juliann Pulse made aware.   CSW explained to grand-daughter that we will keep SNF as backup incase they change their mind. Awaiting response from [facility] re: bed offer.     Raynaldo Opitz, [facility] Clinical Social Worker cell #: [phone]    CLINICAL SOCIAL WORK PLACEMENT  NOTE  Date:  07/23/2015  Patient Details  Name: BRANTLEE PENN MRN: [id number] Date of Birth: [birth year]  Clinical Social Work is seeking post-discharge placement for this patient at the [place] level of care (*CSW will initial, date and re-position this form in  chart as items are completed):  Yes   Patient/family provided with [place] Work Department's list of facilities offering this level of care within the geographic area requested by the patient (or if unable, by the patient's family).  Yes   Patient/family informed of their freedom to choose among providers that offer the needed level of care, that participate in Medicare, Medicaid or managed care program needed by the patient, have an available bed and are willing to accept the patient.  Yes   Patient/family informed of [facility]'s ownership interest in [facility] and [facility], as well as of the fact that they are under no obligation to receive care at these facilities.  PASRR submitted to EDS on 07/23/15     PASRR number received on 07/23/15     Existing  PASRR number confirmed on       FL2 transmitted to all facilities in geographic area requested by pt/family on 07/23/15     FL2 transmitted to all facilities within larger geographic area on       Patient informed that his/her managed care company has contracts with or will negotiate with certain facilities, including the following:            Patient/family informed of bed offers received.  Patient chooses bed at       Physician recommends and patient chooses bed at      Patient to be transferred to   on  .  Patient to be transferred to facility by       Patient family notified on   of transfer.  Name of family member notified:        PHYSICIAN       Additional Comment:    _______________________________________________ Standley Brooking, LCSW 07/23/2015, 2:08 PM

## 2015-07-23 NOTE — Progress Notes (Signed)
Patient had 3.2sec pause and heart rate drop down to the 30s nonsustained, patient alert/awake denies any distress, BP  156/90, 77, 20, 99%-RA, notified Dr. Candiss Norse, ordered to D/C metoprolol, EKG and TSH. Will continue to monitor patient.

## 2015-07-23 NOTE — Progress Notes (Addendum)
PROGRESS NOTE                                                                                                                                                                                                             Patient Demographics:    [name], is a 82 y.o. male, DOB - 01/12/1931, KXF:[id number]  Admit date - 07/22/2015   Admitting Physician Eugenie Filler, MD  Outpatient Primary MD for the patient is [name], MD  LOS - 1  Chief Complaint  Patient presents with  . Rectal Bleeding  . Urinary Tract Infection       Brief Narrative    [name] is a 82 y.o. 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  He apparently was being followed in the urology office closely and had his Foley catheter changed 10 days ago, he also had signs of UTI. His ago at which time UA was obtained a neurology office, his cultures came back positive for Escherichia coli which was pan resistant, he continued to have symptoms of delirium which are typical for his UTI and urology office was contacted who requested patient come to the ER and be admitted for a PICC line placement for IV cefepime treatment which seems to be the only viable choice at this time.  He did have leukocytosis and evidence of  UTI with UTI related delirium upon admission. He also had an episode of bright red blood per rectum thought to be hemorrhoidal without any falling H&H.     Subjective:    [name] today has, No headache, No chest pain, No abdominal pain - No Nausea, No new weakness tingling or numbness, No Cough - SOB. Remains pleasantly confused   Assessment  & Plan :      1.Toxic encephalopathy on top of dementia due to UTI. Continue supportive care for  UTI, avoid benzodiazepines and narcotics, Haldol when necessary for delirium. Discharge as soon as possible back to his familiar surroundings which will be home.  2. UTI due to indwelling the Foley catheter. Outpatient cultures growing Escherichia coli, sensitive to cefepime and resistant to oral antibiotics, PICC line placed on 07/23/2015, continue cefepime, if blood cultures negative will discharge home on total of 14 days of treatment with urology follow-up Dr. Beatrix Fetters.  3. Chronic atrial fibrillation. [place] vasc 2 score of greater than 4. Continue beta blocker along with aspirin. Not on anticoagulation per ECP and primary cardiologist due to fall risk and dementia.  4. Dementia. He is at risk for delirium, supportive care only.  5. Dyslipidemia. On statin.  6. One episode of bright red blood per rectum in the ER due to hemorrhoidal bleed. Stable, stable H&H. Continue PPI.  7. Depression. Continue Zoloft and Wellbutrin.  8. History of bladder and lung cancer. Outpatient follow-up with PCP and oncology.  9. Essential hypertension. On beta blocker stable.   Addendum was paged at 6:40 PM. Patient's heart rate upon sleeping drops to low 30s, one 3.5 and pause noted on tree. Patient asymptomatic. We will stop beta blocker. Check TSH. Check twelve-lead EKG. We'll discuss with cardiologist on call.    Code Status :  Full  Family Communication  :  None present  Disposition Plan  :  Home 1-2 days  Consults  :  Urology  Procedures  :   PICC 07-23-15  DVT Prophylaxis  :   Heparin    Lab Results  Component Value Date   PLT 187 07/23/2015    Inpatient Medications  Scheduled Meds: . atorvastatin  10 mg Oral QHS  . buPROPion  75 mg Oral Q breakfast  . calcium carbonate  1 tablet Oral Q breakfast  . ceFEPime (MAXIPIME) IV  2 g Intravenous Q12H  . cholecalciferol  1,000 Units Oral q morning - 10a  . methenamine  1,000 mg Oral BID  . metoprolol succinate  25 mg Oral Q breakfast  . pantoprazole  40 mg Oral Q0600  . saccharomyces boulardii  250 mg Oral Daily  . sertraline  100 mg Oral Q breakfast  . sodium chloride flush  3 mL Intravenous Q12H  . vitamin B-12  50 mcg Oral q morning - 10a  . ascorbic acid  1,000 mg Oral q morning - 10a   Continuous Infusions: . sodium chloride 50 mL/hr at 07/23/15 0842   PRN Meds:.acetaminophen **OR** [DISCONTINUED] acetaminophen, hydrocortisone valerate cream, ipratropium-albuterol, [DISCONTINUED] ondansetron **OR** ondansetron (ZOFRAN) IV, oxyCODONE, senna-docusate, sodium chloride flush, sorbitol  Antibiotics  :    Anti-infectives    Start     Dose/Rate Route Frequency Ordered Stop   07/23/15 0400  ceFEPIme (MAXIPIME) 2 g in dextrose 5 % 50 mL IVPB     2 g 100 mL/hr over 30 Minutes Intravenous Every 12 hours 07/22/15 1541     07/22/15 1400  ceFEPIme (MAXIPIME) 2 g in dextrose 5 % 50 mL IVPB     2 g 100 mL/hr over 30 Minutes Intravenous  Once 07/22/15 1352 07/22/15 1555   07/22/15 1345  cefTRIAXone (ROCEPHIN) 1 g in dextrose 5 % 50 mL IVPB  Status:  Discontinued     1 g 100 mL/hr over 30 Minutes Intravenous  Once 07/22/15 1338 07/22/15 1348   07/22/15 1345  vancomycin (VANCOCIN) IVPB 1000 mg/200 mL premix  Status:  Discontinued     1,000 mg 200 mL/hr over 60 Minutes Intravenous  Once 07/22/15 1344 07/22/15 1348         Objective:   Filed Vitals:   07/22/15 2048 07/23/15 0449 07/23/15 0515 07/23/15 0940  BP: 156/97 156/80 116/59 118/73  Pulse: 65 86 71 66  Temp: 97.7 F  (36.5 C) 97.7 F (36.5 C) 99.1 F (37.3 C)   TempSrc: Oral Oral Oral   Resp: '18 18 18   '$ Height:      Weight:  87.635 kg (193 lb 3.2 oz)    SpO2: 97% 97% 98%     Wt Readings from Last 3 Encounters:  07/23/15 87.635 kg (193 lb 3.2 oz)  06/23/15 91.173 kg (201 lb)  06/16/15 91.536 kg (201 lb 12.8 oz)     Intake/Output Summary (Last 24 hours) at 07/23/15 1029 Last data filed at 07/23/15 0900  Gross per 24 hour  Intake 621.25 ml  Output   1125 ml  Net -503.75 ml     Physical Exam  Awake, Pleasantly confused, No new F.N deficits, Normal affect [place].AT,PERRAL Supple Neck,No JVD, No cervical lymphadenopathy appriciated.  Symmetrical Chest wall movement, Good air movement bilaterally, CTAB RRR,No Gallops,Rubs or new Murmurs, No Parasternal Heave +ve B.Sounds, Abd Soft, No tenderness, No organomegaly appriciated, No rebound - guarding or rigidity. Foley in place. No Cyanosis, Clubbing or edema, No new Rash or bruise      Data Review:    CBC  Recent Labs Lab 07/22/15 1314 07/23/15 0525  WBC 12.4* 14.3*  HGB 15.1 13.4  HCT 45.4 41.9  PLT 211 187  MCV 88.5 90.5  MCH 29.4 28.9  MCHC 33.3 32.0  RDW 14.6 14.7  LYMPHSABS 1.2  --   MONOABS 0.9  --   EOSABS 0.2  --   BASOSABS 0.0  --     Chemistries   Recent Labs Lab 07/22/15 1314 07/23/15 0525  NA 139 139  K 3.8 3.8  CL 106 108  CO2 26 26  GLUCOSE 115* 105*  BUN 23* 20  CREATININE 1.08 0.95  CALCIUM 9.4 8.9  MG 2.2  --   AST 18  --   ALT 14*  --   ALKPHOS 61  --   BILITOT 1.1  --    ------------------------------------------------------------------------------------------------------------------ No results for input(s): CHOL, HDL, LDLCALC, TRIG, CHOLHDL, LDLDIRECT in the last 72 hours.  No results found for: HGBA1C ------------------------------------------------------------------------------------------------------------------ No results for input(s): TSH, T4TOTAL, T3FREE, THYROIDAB in the last 72  hours.  Invalid input(s): FREET3 ------------------------------------------------------------------------------------------------------------------ No results for input(s): VITAMINB12, FOLATE, FERRITIN, TIBC, IRON, RETICCTPCT in the last 72 hours.  Coagulation profile No results for input(s): INR, PROTIME in the last 168 hours.  No results for input(s): DDIMER in the last 72 hours.  Cardiac Enzymes No results for input(s): CKMB, TROPONINI, MYOGLOBIN in the last 168 hours.  Invalid input(s): CK ------------------------------------------------------------------------------------------------------------------    Component Value Date/Time   BNP 107.6* 08/19/2014 2032    Micro Results Recent Results (from the past 240 hour(s))  Blood Culture (routine x 2)     Status: None (Preliminary result)   Collection Time: 07/22/15  1:06 PM  Result Value Ref Range Status   Specimen Description BLOOD LEFT HAND  Final   Special Requests   Final    BOTTLES DRAWN AEROBIC AND ANAEROBIC 5CC Performed at [facility]    Culture PENDING  Incomplete   Report Status PENDING  Incomplete  Blood Culture (routine x 2)     Status: None (Preliminary result)   Collection Time: 07/22/15  1:14  PM  Result Value Ref Range Status   Specimen Description BLOOD LEFT ANTECUBITAL  Final   Special Requests   Final    BOTTLES DRAWN AEROBIC AND ANAEROBIC 5CC Performed at [facility]    Culture PENDING  Incomplete   Report Status PENDING  Incomplete    Radiology Reports Dg Chest 2 View  07/22/2015  CLINICAL DATA:  Back pain.  Altered mental status. EXAM: CHEST  2 VIEW COMPARISON:  05/30/2015 chest radiograph. FINDINGS: Surgical clips and sutures overlie the left parahilar region. Stable cardiomediastinal silhouette with top-normal heart size. No pneumothorax. No pleural effusion. Lungs appear clear, with no acute consolidative airspace disease and no pulmonary edema. IMPRESSION: No active  cardiopulmonary disease. Electronically Signed   By: Ilona Sorrel M.D.   On: 07/22/2015 12:15   Dg Lumbar Spine Complete  06/23/2015  CLINICAL DATA:  [place] today at home.  Sharp lower back pain. EXAM: LUMBAR SPINE - COMPLETE 4+ VIEW COMPARISON:  04/30/2012.  CT, 06/13/2014 FINDINGS: No acute fracture. Moderate compression fracture of T12, stable from the prior CT. No other fractures. No spondylolisthesis. Mild loss of disc height at L1-L2 and L3-L4. Moderate loss disc height at L4-L5 and L5-S1. Endplate osteophytes at all levels. Mild endplate sclerosis at W4-R1 and L5-S1. Bones are diffusely demineralized. There are calcifications along a normal caliber abdominal aorta and iliac arteries. IMPRESSION: 1. No acute fracture or spondylolisthesis. 2. Chronic moderate compression fracture T12. 3. Degenerative changes as described. Electronically Signed   By: Lajean Manes M.D.   On: 06/23/2015 11:34   Ct Head Wo Contrast  07/22/2015  CLINICAL DATA:  Confusion EXAM: CT HEAD WITHOUT CONTRAST TECHNIQUE: Contiguous axial images were obtained from the base of the skull through the vertex without intravenous contrast. COMPARISON:  06/23/2015 FINDINGS: Bony calvarium is intact. No gross soft tissue abnormality is noted. Paranasal sinuses are well aerated. Diffuse atrophic changes are again seen as are chronic white matter ischemic changes. These are stable in appearance from the prior exam. No findings to suggest acute hemorrhage, acute infarction or space-occupying mass lesion are noted. IMPRESSION: Chronic atrophic and ischemic changes without acute intracranial abnormality. Electronically Signed   By: Inez Catalina M.D.   On: 07/22/2015 12:36   Ct Head Wo Contrast  06/23/2015  CLINICAL DATA:  Pain following fall earlier today. EXAM: CT HEAD WITHOUT CONTRAST TECHNIQUE: Contiguous axial images were obtained from the base of the skull through the vertex without intravenous contrast. COMPARISON:  November 07, 2014  FINDINGS: Moderate diffuse atrophy is stable. There is no intracranial mass, hemorrhage, extra-axial fluid collection, or midline shift. There is periventricular small vessel disease throughout the centra semiovale bilaterally. Small vessel disease is also noted in the anterior limbs of each internal and external capsule region. No acute infarct evident. The bony calvarium appears intact. The mastoid air cells are clear. There is mucosal thickening in each maxillary antrum as well as in several ethmoid air cells bilaterally. No intraorbital lesions are appreciable. IMPRESSION: Atrophy with supratentorial small vessel disease, stable. No acute infarct evident. No hemorrhage, mass, or extra-axial fluid collection. Areas of paranasal sinus disease as noted above. Electronically Signed   By: Lowella Grip III M.D.   On: 06/23/2015 11:11   Dg Chest Port 1 View  07/23/2015  CLINICAL DATA:  Evaluate central line placement. EXAM: PORTABLE CHEST 1 VIEW COMPARISON:  Chest radiograph 07/22/2015. FINDINGS: Right upper extremity PICC line tip projects over the superior vena cava. Stable cardiomegaly. Tortuosity of thoracic aorta.  No consolidative pulmonary opacities. No pleural effusion or pneumothorax. Mitral annular calcifications. IMPRESSION: Right upper extremity PICC line tip projects over the superior vena cava. Electronically Signed   By: Lovey Newcomer M.D.   On: 07/23/2015 09:18    Time Spent in minutes  30   Klyde Banka K M.D on 07/23/2015 at 10:29 AM  Between 7am to 7pm - Pager - [phone]  After 7pm go to [email or web address] - password [facility]  [facility] -  Office  [phone]

## 2015-07-23 NOTE — Progress Notes (Signed)
Peripherally Inserted Central Catheter/Midline Placement  The IV Nurse has discussed with the patient and/or persons authorized to consent for the patient, the purpose of this procedure and the potential benefits and risks involved with this procedure.  The benefits include less needle sticks, lab draws from the catheter and patient may be discharged home with the catheter.  Risks include, but not limited to, infection, bleeding, blood clot (thrombus formation), and puncture of an artery; nerve damage and irregular heat beat.  Alternatives to this procedure were also discussed.  PICC/Midline Placement Documentation        Henderson [place] 07/23/2015, 8:53 AM Phone consent obtained from Micki Riley, granddaughter and POA

## 2015-07-23 NOTE — Progress Notes (Signed)
Occupational Therapy Evaluation Patient Details Name: [name] MRN: [id number] DOB: 10-Jul-1930 Today's Date: 07/23/2015    History of Present Illness [name] is a 82 y.o. 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   Clinical Impression   Patient presents to OT with decreased ADL independence and safety due to the deficits listed below. He will benefit from skilled OT to maximize function and to facilitate a safe discharge. OT will follow.    Follow Up Recommendations  SNF;Supervision/Assistance - 24 hour    Equipment Recommendations  Other (comment) (tbd)    Recommendations for Other Services       Precautions / Restrictions Precautions Precautions: Fall Restrictions Weight Bearing Restrictions: No      Mobility Bed Mobility Overal bed mobility: Needs Assistance;+ 2 for safety/equipment Bed Mobility: Supine to Sit     Supine to sit: Mod assist;HOB elevated;+2 for safety/equipment     General bed mobility comments: needed assistance for all aspects, increased assistance and time to scoot to EOB.  Transfers Overall transfer level: Needs assistance Equipment used: Rolling walker (2 wheeled) Transfers: Sit to/from Stand Sit to Stand: Mod assist;Min assist;+2 physical assistance;+2 safety/equipment         General transfer  comment: needed assistance to rise, stabilize, leaning to right and posteriorly initially. Needed cues hand placement    Balance                                            ADL Overall ADL's : Needs assistance/impaired     Grooming: Wash/dry hands;Minimal assistance;Sitting           Upper Body Dressing : Moderate assistance;Sitting Upper Body Dressing Details (indicate cue type and reason): don gown as robe Lower Body Dressing: Total assistance;Sit to/from stand               Functional mobility during ADLs: Moderate assistance;Minimal assistance;+2 for physical assistance;+2 for safety/equipment;Rolling walker;Cueing for safety General ADL Comments: Patient confused during session, thought he was at his home in [facility]. With significant assistance, patient got up and ambulated to recliner with RW. Chair alarm placed for safety. Need to find out from family if they can handle taking care of him at home.     Vision     Perception     Praxis      Pertinent Vitals/Pain Pain Assessment: 0-10 Pain Score: 6  Pain Location: back Pain Descriptors / Indicators: Constant Pain Intervention(s): Limited activity within patient's tolerance;Monitored during session     Hand Dominance Right   Extremity/Trunk Assessment Upper Extremity Assessment Upper Extremity Assessment: Overall WFL for tasks assessed   Lower Extremity Assessment Lower Extremity Assessment: Defer to PT evaluation       Communication Communication Communication: HOH   Cognition Arousal/Alertness: Awake/alert Behavior During Therapy: WFL for tasks assessed/performed Overall Cognitive Status: Impaired/Different from baseline Area of Impairment: Orientation;Memory;Safety/judgement;Following commands;Problem solving Orientation  Level: Place;Time   Memory: Decreased short-term memory Following Commands: Follows one step commands inconsistently Safety/Judgement: Decreased awareness  of safety;Decreased awareness of deficits   Problem Solving: Decreased initiation;Slow processing     General Comments       Exercises       Shoulder Instructions      Home Living Family/patient expects to be discharged to:: Unsure Living Arrangements: Other relatives                               Additional Comments: no family available to confirm      Prior Functioning/Environment Level of Independence: Needs assistance  Gait / Transfers Assistance Needed: walker vs w/c but per chart mostly w/c ADL's / Homemaking Assistance Needed: granddaughter assists pt with showers, pt independent with getting dressed, granddaughter does cooking/cleaning (from previous admission)        OT Diagnosis: Generalized weakness;Cognitive deficits   OT Problem List: Decreased strength;Decreased activity tolerance;Impaired balance (sitting and/or standing);Decreased cognition;Decreased safety awareness;Decreased knowledge of use of DME or AE;Pain   OT Treatment/Interventions: Self-care/ADL training;DME and/or AE instruction;Therapeutic activities;Patient/family education    OT Goals(Current goals can be found in the care plan section) Acute Rehab OT Goals Patient Stated Goal: none stated OT Goal Formulation: Patient unable to participate in goal setting Time For Goal Achievement: 08/06/15 Potential to Achieve Goals: Good ADL Goals Pt Will Perform Grooming: with set-up;with supervision;sitting Pt Will Perform Upper Body Bathing: with min assist;sitting Pt Will Perform Upper Body Dressing: with min assist;sitting Pt Will Transfer to Toilet: with min assist;ambulating;bedside commode  OT Frequency: Min 2X/week   Barriers to D/C:            Co-evaluation PT/OT/SLP Co-Evaluation/Treatment: Yes Reason for Co-Treatment: For patient/therapist safety PT goals addressed during session: Mobility/safety with mobility OT goals addressed during session: ADL's and self-care       End of Session Equipment Utilized During Treatment: Gait belt;Rolling walker  Activity Tolerance: Patient tolerated treatment well Patient left: in chair;with call bell/phone within reach;with chair alarm set   Time: 1045-1103 OT Time Calculation (min): 18 min Charges:  OT General Charges $OT Visit: 1 Procedure OT Evaluation $OT Eval Moderate Complexity: 1 Procedure G-Codes:    Kwynn Schlotter A 08-18-2015, 12:45 PM

## 2015-07-24 ENCOUNTER — Encounter (HOSPITAL_COMMUNITY): Payer: Self-pay | Admitting: Cardiology

## 2015-07-24 LAB — CBC
HEMATOCRIT: 42.9 % (ref 39.0–52.0)
Hemoglobin: 13.7 g/dL (ref 13.0–17.0)
MCH: 28.6 pg (ref 26.0–34.0)
MCHC: 31.9 g/dL (ref 30.0–36.0)
MCV: 89.6 fL (ref 78.0–100.0)
PLATELETS: 188 10*3/uL (ref 150–400)
RBC: 4.79 MIL/uL (ref 4.22–5.81)
RDW: 14.8 % (ref 11.5–15.5)
WBC: 15.4 10*3/uL — AB (ref 4.0–10.5)

## 2015-07-24 MED ORDER — AMLODIPINE BESYLATE 10 MG PO TABS
10.0000 mg | ORAL_TABLET | Freq: Every day | ORAL | Status: DC
  Administered 2015-07-24: 10 mg via ORAL
  Filled 2015-07-24: qty 1

## 2015-07-24 MED ORDER — DEXTROSE 5 % IV SOLN: 2.0000 g | Freq: Two times a day (BID) | INTRAVENOUS | Status: DC

## 2015-07-24 MED ORDER — NITROFURANTOIN MACROCRYSTAL 100 MG PO CAPS: ORAL_CAPSULE | ORAL | Status: DC

## 2015-07-24 MED ORDER — AMLODIPINE BESYLATE 10 MG PO TABS: 10.0000 mg | ORAL_TABLET | Freq: Every day | ORAL | Status: DC

## 2015-07-24 MED ORDER — HEPARIN SOD (PORK) LOCK FLUSH 100 UNIT/ML IV SOLN
250.0000 [IU] | INTRAVENOUS | Status: AC | PRN
  Administered 2015-07-24: 250 [IU]

## 2015-07-24 NOTE — Progress Notes (Signed)
[facility]  Patient Status:Active pt with [facility] for [facility] services/New pt this admission for [place] is providing the following services: [facility] will gladly partner with [facility] for provide home IV ABX for [name] upon DC home.  I have discussed at length with the patients grandson, Ray, the plan for IV cefepime at home and specifics of administration. Primary caregiver, [name], the granddaughter will be out of town until Sunday PM.  Ray feels he can support hte IV ABX until [name] returns home.  [facility] provides/administers cefepime 2 grams in a 20 ml volume IV Push over 10 minutes- pt will be on Q 12 hours dosing schedule at 8A/8P for home.  [facility] team will follow with Primitivo Gauze, RN at [facility] to support the pt and agency upon DC to home.   If patient discharges after hours, please call [phone].   Larry Sierras 07/24/2015, 10:19 AM

## 2015-07-24 NOTE — Care Management Note (Signed)
Case Management Note  Patient Details  Name: LARENCE THONE MRN: [id number] Date of Birth: Jul 05, 1930  Subjective/Objective: [facility] iv liason Pam aware of d/c today, & [facility]-iv abx.[facility]-rep Abby aware of HHRN/PT/OT, & d/c today.MD notified of [place] iv abx,script orders needed.Family will transport home.                   Action/Plan: d/c home w/[facility]-iv abx.   Expected Discharge Date:   (UNKNOWN)               Expected Discharge Plan:  [place]  In-House Referral:     Discharge planning Services  CM Consult  Post Acute Care Choice:  Home Health (Active w/[facility]-RN/PT/OT) Choice offered to:  Adult Children  DME Arranged:    DME Agency:     HH Arranged:  IV Antibiotics, RN, PT, OT HH Agency:  [facility]  Status of Service:  Completed, signed off  Medicare Important Message Given:    Date Medicare IM Given:    Medicare IM give by:    Date Additional Medicare IM Given:    Additional Medicare Important Message give by:     If discussed at [place] of Stay Meetings, dates discussed:    Additional Comments:  [name], RN 07/24/2015, 10:44 AM

## 2015-07-24 NOTE — Care Management Note (Signed)
Case Management Note  Patient Details  Name: [name] MRN: [id number] Date of Birth: Jul 18, 1930  Subjective/Objective:  Spoke to grand daughter [name] on phone to confirm d/c plans-for home w/HHC. [facility]-iv liason Pam aware-await iv therapy orders, script,& HHRN/PT/OT.                  Action/Plan:d/c plan home w/HHC.   Expected Discharge Date:   (UNKNOWN)               Expected Discharge Plan:  [place]   In-House Referral:     Discharge planning Services  CM Consult  Post Acute Care Choice:  Home Health (Active w/[facility]-RN/PT/OT) Choice offered to:  Adult Children  DME Arranged:    DME Agency:     HH Arranged:  IV Antibiotics HH Agency:  [place]  Status of Service:  In process, will continue to follow  Medicare Important Message Given:    Date Medicare IM Given:    Medicare IM give by:    Date Additional Medicare IM Given:    Additional Medicare Important Message give by:     If discussed at [place] of Stay Meetings, dates discussed:    Additional Comments:  [name], RN 07/24/2015, 9:29 AM

## 2015-07-24 NOTE — Discharge Summary (Signed)
[name], is a 82 y.o. male  DOB April 07, 1930  MRN [id number].  Admission date:  07/22/2015  Admitting Physician  Eugenie Filler, MD  Discharge Date:  07/24/2015   Primary MD  [name], MD  Recommendations for primary care physician for things to follow:   Kindly check CBC, BMP within 5-7 days.  Outpatient follow-up with urology and his primary cardiologist within a week   Admission Diagnosis  Delirium [R41.0] Pyelonephritis [N12]   Discharge Diagnosis  Delirium [R41.0] Pyelonephritis [N12]    Principal Problem:   E. coli UTI Active Problems:   Malignant neoplasm of bladder (HCC)   HLD (hyperlipidemia)   HYPERTENSION, BENIGN   Lung cancer (HCC)   Atrial fibrillation (HCC)   GERD (gastroesophageal reflux disease)   Encephalopathy, metabolic   Pyelonephritis   Leukocytosis   Dehydration   Rectal bleeding      Past Medical History  Diagnosis Date  . Atrial fibrillation ([place])   . Hyperlipidemia   . Hypertension   . UTI (urinary tract infection)   . Dementia   . Bladder cancer (HCC)     hx of surgery and chemo  . Lung cancer [facility])     Status post surgery resection  . Diverticulitis   . Depression 08/19/2014  . CVA (cerebral infarction)     Past Surgical History  Procedure Laterality Date  . Hernia repair  2006  . Cataract extraction, bilateral  1998, 2000  . Cystectomy  2002  . Lung removal, partial         HPI  from the history and physical done on the day of admission:    [name] is a 82 y.o. 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  He apparently was being followed in the urology office closely and had his Foley catheter changed 10 days ago, he also had signs of UTI. His ago at which time UA was obtained a neurology office, his cultures came back positive for Escherichia coli which was pan resistant, he continued to have symptoms of delirium which are typical for his UTI and urology office was contacted who requested patient come to the ER and be admitted for a PICC line placement for IV cefepime treatment which seems to be the only viable choice at this time.  He did have leukocytosis and evidence of UTI with UTI related delirium upon admission. He also had an episode of bright red blood per rectum thought to be hemorrhoidal without any falling H&H.      Hospital  Course:     1.Toxic encephalopathy on top of dementia due to UTILikely due to indwelling Foley catheter. With underlying treatment of UTI mentation close to baseline, remains at risk for delirium in the future.  2. UTI due to indwelling the Foley catheter. Outpatient cultures growing Escherichia coli, sensitive to cefepime and resistant to oral antibiotics, PICC line placed on 07/23/2015, continue cefepime, that cultures have remained negative will discharge home on total of 14 days of treatment with urology follow-up Dr. Beatrix Fetters.  3. Chronic atrial fibrillation. [place] vasc 2 score of greater than 4. History of asymptomatic bradycardia and up to 3 second sinus pauses on telemetry here. Was seen by cardiology and beta blocker was stopped. Not on anticoagulation  per PCP and primary cardiologist due to fall risk and dementia. Will have him follow outpatient with primary cardiologist post discharge.  4. Dementia. He is at risk for delirium, supportive care only.  5. Dyslipidemia. On statin.  6. One episode of bright red blood per rectum in the ER due to hemorrhoidal bleed. Stable, stable H&H.    7. Depression. Continue Zoloft and Wellbutrin.  8. History of bladder and lung cancer. Outpatient follow-up with PCP and oncology.  9. Essential hypertension. Due to bradycardia and sinus pauses switched from beta blocker to Norvasc.    Follow UP  Follow-up Information    Follow up with [name], MD. Schedule an appointment as soon as possible for a visit in 1 week.   Specialty:  Internal Medicine   Contact information:   [place] [address] [phone]       Follow up with Jorja Loa, MD. Schedule an appointment as soon as possible for a visit in 1 week.   Specialty:  Urology   Contact information:   [place] [address] [phone]       Follow up with Kirk Ruths, MD. Schedule an appointment as soon as possible for a visit in 1 week.   Specialty:  Cardiology   Contact information:   [address] [place] [address] [phone]        Consults obtained - Urology  Discharge Condition: Stable  Diet and Activity recommendation: See Discharge Instructions below  Discharge Instructions           Discharge Instructions    Diet - low sodium heart healthy    Complete by:  As directed      Discharge instructions    Complete by:  As directed   Follow with Primary MD [name], MD and your Urologist in 7 days   Get CBC, CMP, 2 view Chest X ray checked  by Primary MD next visit.    Activity: As tolerated with Full fall precautions use walker/cane & assistance as needed   Disposition Home     Diet:   Heart Healthy with feeding assistance and aspiration  precautions.  For Heart failure patients - Check your Weight same time everyday, if you gain over 2 pounds, or you develop in leg swelling, experience more shortness of breath or chest pain, call your Primary MD immediately. Follow Cardiac Low Salt Diet and 1.5 lit/day fluid restriction.   On your next visit with your primary care physician please Get Medicines reviewed and adjusted.   Please request your Prim.MD to go over all Hospital Tests and Procedure/Radiological results at the follow up, please get all Hospital records sent to your Prim MD by signing hospital release before you go  home.   If you experience worsening of your admission symptoms, develop shortness of breath, life threatening emergency, suicidal or homicidal thoughts you must seek medical attention immediately by calling 911 or calling your MD immediately  if symptoms less severe.  You Must read complete instructions/literature along with all the possible adverse reactions/side effects for all the Medicines you take and that have been prescribed to you. Take any new Medicines after you have completely understood and accpet all the possible adverse reactions/side effects.   Do not drive, operate heavy machinery, perform activities at heights, swimming or participation in water activities or provide baby sitting services if your were admitted for syncope or siezures until you have seen by Primary MD or a Neurologist and advised to do so again.  Do not drive when taking Pain medications.    Do not take more than prescribed Pain, Sleep and Anxiety Medications  Special Instructions: If you have smoked or chewed Tobacco  in the last 2 yrs please stop smoking, stop any regular Alcohol  and or any Recreational drug use.  Wear Seat belts while driving.   Please note  You were cared for by a hospitalist during your hospital stay. If you have any questions about your discharge medications or the care you received while you were  in the hospital after you are discharged, you can call the unit and asked to speak with the hospitalist on call if the hospitalist that took care of you is not available. Once you are discharged, your primary care physician will handle any further medical issues. Please note that NO REFILLS for any discharge medications will be authorized once you are discharged, as it is imperative that you return to your primary care physician (or establish a relationship with a primary care physician if you do not have one) for your aftercare needs so that they can reassess your need for medications and monitor your lab values.     Increase activity slowly    Complete by:  As directed              Discharge Medications       Medication List    STOP taking these medications        metoprolol succinate 25 MG 24 hr tablet  Commonly known as:  TOPROL-XL      TAKE these medications        ALPHA LIPOIC ACID PO  Take 200 mg by mouth every morning.     amLODipine 10 MG tablet  Commonly known as:  NORVASC  Take 1 tablet (10 mg total) by mouth daily.     ascorbic acid 1000 MG tablet  Commonly known as:  VITAMIN C  Take 1,000 mg by mouth every morning.     aspirin EC 81 MG tablet  Take 81 mg by mouth daily with breakfast.     atorvastatin 10 MG tablet  Commonly known as:  LIPITOR  Take 10 mg by mouth at bedtime.     buPROPion 75 MG tablet  Commonly known as:  WELLBUTRIN  Take 75 mg by mouth daily with breakfast.     calcium carbonate 1250 (500 Ca) MG tablet  Commonly known as:  OS-CAL - dosed in mg of elemental calcium  Take 1 tablet by mouth daily with breakfast.     ceFEPIme 2 g in dextrose 5 % 50 mL  Inject 2 g into the vein every 12 (twelve) hours. Stop date 08/03/2015     EQL VITAMIN  D3 1000 units tablet  Generic drug:  Cholecalciferol  Take 1,000 Units by mouth every morning.     hydrocortisone valerate cream 0.2 %  Commonly known as:  WESTCORT  Apply 1 application topically 2  (two) times daily as needed (itching).     ibuprofen 400 MG tablet  Commonly known as:  ADVIL,MOTRIN  Take 400 mg by mouth every 6 (six) hours as needed for moderate pain.     MELATONIN PO  Take 3 mg by mouth at bedtime as needed (sleep).     methenamine 1 g tablet  Commonly known as:  HIPREX  Take 1 g by mouth 2 (two) times daily.     nitrofurantoin 100 MG capsule  Commonly known as:  MACRODANTIN  Resume 6/13 after antibiotics are done.     saccharomyces boulardii 250 MG capsule  Commonly known as:  FLORASTOR  Take 1 capsule (250 mg total) by mouth 2 (two) times daily.     sertraline 100 MG tablet  Commonly known as:  ZOLOFT  Take 100 mg by mouth daily with breakfast.     VITAMIN B 12 PO  Take 50 mcg by mouth every morning.        Major procedures and Radiology Reports - PLEASE review detailed and final reports for all details, in brief -       Dg Chest 2 View  07/22/2015  CLINICAL DATA:  Back pain.  Altered mental status. EXAM: CHEST  2 VIEW COMPARISON:  05/30/2015 chest radiograph. FINDINGS: Surgical clips and sutures overlie the left parahilar region. Stable cardiomediastinal silhouette with top-normal heart size. No pneumothorax. No pleural effusion. Lungs appear clear, with no acute consolidative airspace disease and no pulmonary edema. IMPRESSION: No active cardiopulmonary disease. Electronically Signed   By: Ilona Sorrel M.D.   On: 07/22/2015 12:15   Ct Head Wo Contrast  07/22/2015  CLINICAL DATA:  Confusion EXAM: CT HEAD WITHOUT CONTRAST TECHNIQUE: Contiguous axial images were obtained from the base of the skull through the vertex without intravenous contrast. COMPARISON:  06/23/2015 FINDINGS: Bony calvarium is intact. No gross soft tissue abnormality is noted. Paranasal sinuses are well aerated. Diffuse atrophic changes are again seen as are chronic white matter ischemic changes. These are stable in appearance from the prior exam. No findings to suggest acute  hemorrhage, acute infarction or space-occupying mass lesion are noted. IMPRESSION: Chronic atrophic and ischemic changes without acute intracranial abnormality. Electronically Signed   By: Inez Catalina M.D.   On: 07/22/2015 12:36   Dg Chest Port 1 View  07/23/2015  CLINICAL DATA:  Evaluate central line placement. EXAM: PORTABLE CHEST 1 VIEW COMPARISON:  Chest radiograph 07/22/2015. FINDINGS: Right upper extremity PICC line tip projects over the superior vena cava. Stable cardiomegaly. Tortuosity of thoracic aorta. No consolidative pulmonary opacities. No pleural effusion or pneumothorax. Mitral annular calcifications. IMPRESSION: Right upper extremity PICC line tip projects over the superior vena cava. Electronically Signed   By: Lovey Newcomer M.D.   On: 07/23/2015 09:18    Micro Results      Recent Results (from the past 240 hour(s))  Blood Culture (routine x 2)     Status: None (Preliminary result)   Collection Time: 07/22/15  1:06 PM  Result Value Ref Range Status   Specimen Description BLOOD LEFT HAND  Final   Special Requests BOTTLES DRAWN AEROBIC AND ANAEROBIC 5CC  Final   Culture   Final    NO GROWTH 1 DAY Performed at [facility]  Report Status PENDING  Incomplete  Blood Culture (routine x 2)     Status: None (Preliminary result)   Collection Time: 07/22/15  1:14 PM  Result Value Ref Range Status   Specimen Description BLOOD LEFT ANTECUBITAL  Final   Special Requests BOTTLES DRAWN AEROBIC AND ANAEROBIC 5CC  Final   Culture   Final    NO GROWTH 1 DAY Performed at [facility]    Report Status PENDING  Incomplete  Urine culture     Status: Abnormal (Preliminary result)   Collection Time: 07/22/15  3:55 PM  Result Value Ref Range Status   Specimen Description URINE, CATHETERIZED  Final   Special Requests NONE  Final   Culture (A)  Final    >=100,000 COLONIES/mL GRAM NEGATIVE RODS CULTURE REINCUBATED FOR BETTER GROWTH Performed at [facility]     Report Status PENDING  Incomplete     Today   Subjective    [name] today has no headache,no chest abdominal pain,no new weakness tingling or numbness, feels much better wants to go home today.     Objective   Blood pressure 159/67, pulse 86, temperature 97.5 F (36.4 C), temperature source Oral, resp. rate 20, height '6\' 1"'$  (1.854 m), weight 87.726 kg (193 lb 6.4 oz), SpO2 98 %.   Intake/Output Summary (Last 24 hours) at 07/24/15 1043 Last data filed at 07/24/15 0950  Gross per 24 hour  Intake 2592.5 ml  Output   2100 ml  Net  492.5 ml    Exam Awake Alert, Oriented x 1, No new F.N deficits, Normal affect [place].AT,PERRAL Supple Neck,No JVD, No cervical lymphadenopathy appriciated.  Symmetrical Chest wall movement, Good air movement bilaterally, CTAB RRR,No Gallops,Rubs or new Murmurs, No Parasternal Heave +ve B.Sounds, Abd Soft, Non tender, No organomegaly appriciated, No rebound -guarding or rigidity. Foley in place No Cyanosis, Clubbing or edema, No new Rash or bruise   Data Review   CBC w Diff:  Lab Results  Component Value Date   WBC 15.4* 07/24/2015   WBC 11.1 06/15/2015   WBC 9.9 11/29/2013   HGB 13.7 07/24/2015   HGB 15.0 11/29/2013   HCT 42.9 07/24/2015   HCT 46.0 11/29/2013   PLT 188 07/24/2015   PLT 197 11/29/2013   LYMPHOPCT 10 07/22/2015   LYMPHOPCT 16.0 11/29/2013   MONOPCT 7 07/22/2015   MONOPCT 9.2 11/29/2013   EOSPCT 2 07/22/2015   EOSPCT 2.3 11/29/2013   BASOPCT 0 07/22/2015   BASOPCT 0.2 11/29/2013    CMP:  Lab Results  Component Value Date   NA 139 07/23/2015   NA 145 06/08/2015   NA 140 11/29/2013   K 3.8 07/23/2015   K 4.4 11/29/2013   CL 108 07/23/2015   CO2 26 07/23/2015   CO2 25 11/29/2013   BUN 20 07/23/2015   BUN 17 06/08/2015   BUN 27.8* 11/29/2013   CREATININE 0.95 07/23/2015   CREATININE 1.2 06/08/2015   CREATININE 1.4* 11/29/2013   GLU 140 06/08/2015   PROT 6.9 07/22/2015   PROT 7.0 11/29/2013   ALBUMIN  4.1 07/22/2015   ALBUMIN 3.5 11/29/2013   BILITOT 1.1 07/22/2015   BILITOT 1.04 11/29/2013   ALKPHOS 61 07/22/2015   ALKPHOS 53 11/29/2013   AST 18 07/22/2015   AST 17 11/29/2013   ALT 14* 07/22/2015   ALT 13 11/29/2013  .   Total Time in preparing paper work, data evaluation and todays exam - 35 minutes  Thurnell Lose M.D on 07/24/2015  at 10:43 AM  [facility]   Office  [phone]

## 2015-07-24 NOTE — Progress Notes (Signed)
Pharmacy Antibiotic Note  [name] is a 82 y.o. male with hx bladder / lung cancer with chronic foley and recurrent UTIs on suppression abx admitted on 07/22/2015 with UTI.  Pharmacy has been consulted for Cefepime dosing.  6/2: D3 Antibiotics. 5/31 Urine culture > 100K GNRs - reincubated.   PICC placed 6/1 in anticipation of 2 weeks therapy  Plan: Continue Cefepime 2g IV q12h Follow up renal fxn, culture results, and clinical course.   Height: '6\' 1"'$  (185.4 cm) Weight: 193 lb 6.4 oz (87.726 kg) IBW/kg (Calculated) : 79.9  Temp (24hrs), Avg:97.6 F (36.4 C), Min:97.5 F (36.4 C), Max:97.9 F (36.6 C)   Recent Labs Lab 07/22/15 1314 07/22/15 1326 07/22/15 1536 07/23/15 0525 07/24/15 0430  WBC 12.4*  --   --  14.3* 15.4*  CREATININE 1.08  --   --  0.95  --   LATICACIDVEN  --  1.77 0.67  --   --     Estimated Creatinine Clearance: 67.8 mL/min (by C-G formula based on Cr of 0.95).    Allergies  Allergen Reactions  . Sulfa Antibiotics Nausea And Vomiting  . Sulfonamide Derivatives Nausea And Vomiting     Antimicrobials this admission: PTA nitrofurantoin (not resumed) / methenamine (resumed)  5/31 cefepime >>  5/31 Rocephin x 1 6/1 fosfomycin (stopped prior to administration)  Dose adjustments this admission:  Microbiology results: 5/22 Urine from Urology office: E. Coli (S to cefepime, otherwise pan-resistant) - not reported as ESBL but looks suspicious  5/31 BCx: NGTD 5/31 UCx: >100K GNRs - reincubated  Thank you for allowing pharmacy to be a part of this patient's care.  Ralene Bathe, PharmD, BCPS 07/24/2015, 8:34 AM  Pager: [phone]

## 2015-07-24 NOTE — Consult Note (Signed)
Primary cardiologist: Dr Burt Knack  HPI: 82 year old male with past medical history of permanent atrial fibrillation, dementia, bladder cancer, lung cancer, hypertension and hyperlipidemia for evaluation of atrial fibrillation. Echocardiogram in 2011 showed normal LV function, moderate left atrial enlargement and mild mitral regurgitation. Patient has a history of bladder cancer with recurrent urinary tract infections and chronic foley catheter. Patient was admitted on May 31 with complaints of generalized weakness and altered mental status. Patient as found to have a urinary tract infection and placed on antibiotics. He was placed on telemetry and was noted to be intermittently bradycardic with occasional pauses by report greater than 3 seconds but I can find no documentation of pauses. At time of evaluation patient is extremely confused. He is alert and oriented to person only. He denies chest pain, dyspnea syncope. Because of his bradycardia and atrial fibrillation, cardiology asked to evaluate.  Medications Prior to Admission  Medication Sig Dispense Refill  . ALPHA LIPOIC ACID PO Take 200 mg by mouth every morning.     Marland Kitchen ascorbic acid (VITAMIN C) 1000 MG tablet Take 1,000 mg by mouth every morning.     Marland Kitchen aspirin EC 81 MG tablet Take 81 mg by mouth daily with breakfast.    . atorvastatin (LIPITOR) 10 MG tablet Take 10 mg by mouth at bedtime.     Marland Kitchen buPROPion (WELLBUTRIN) 75 MG tablet Take 75 mg by mouth daily with breakfast.     . calcium carbonate (OS-CAL - DOSED IN MG OF ELEMENTAL CALCIUM) 1250 MG tablet Take 1 tablet by mouth daily with breakfast.     . Cholecalciferol (EQL VITAMIN D3) 1000 units tablet Take 1,000 Units by mouth every morning.    . Cyanocobalamin (VITAMIN B 12 PO) Take 50 mcg by mouth every morning.     . hydrocortisone valerate cream (WESTCORT) 0.2 % Apply 1 application topically 2 (two) times daily as needed (itching).     Marland Kitchen ibuprofen (ADVIL,MOTRIN) 400 MG tablet Take 400  mg by mouth every 6 (six) hours as needed for moderate pain.    Marland Kitchen MELATONIN PO Take 3 mg by mouth at bedtime as needed (sleep).     . methenamine (HIPREX) 1 G tablet Take 1 g by mouth 2 (two) times daily.     . metoprolol succinate (TOPROL-XL) 25 MG 24 hr tablet Take 25 mg by mouth daily with breakfast.     . nitrofurantoin (MACRODANTIN) 100 MG capsule Take 1 capsule (100 mg total) by mouth every evening. Resume 4/18 after antibiotics are done. (Patient taking differently: Take 100 mg by mouth at bedtime. ) 15 capsule 0  . saccharomyces boulardii (FLORASTOR) 250 MG capsule Take 1 capsule (250 mg total) by mouth 2 (two) times daily. (Patient taking differently: Take 250 mg by mouth daily. )    . sertraline (ZOLOFT) 100 MG tablet Take 100 mg by mouth daily with breakfast.       Allergies  Allergen Reactions  . Sulfa Antibiotics Nausea And Vomiting  . Sulfonamide Derivatives Nausea And Vomiting     Past Medical History  Diagnosis Date  . Atrial fibrillation ([place])   . Hyperlipidemia   . Hypertension   . UTI (urinary tract infection)   . Dementia   . Bladder cancer (H[facility])     hx of surgery and chemo  . Lung cancer [facility])     Status post surgery resection  . Diverticulitis   . Depression 08/19/2014    Past Surgical History  Procedure Laterality  Date  . Hernia repair  2006  . Cataract extraction, bilateral  1998, 2000  . Cystectomy  2002  . Lung removal, partial      Social History   Social History  . Marital Status: Married    Spouse Name: N/A  . Number of Children: 3  . Years of Education: N/A   Occupational History  . retired    Social History Main Topics  . Smoking status: Former Smoker    Types: Cigarettes    Quit date: 10/10/1991  . Smokeless tobacco: Never Used  . Alcohol Use: No  . Drug Use: No  . Sexual Activity: Not Currently   Other Topics Concern  . Not on file   Social History Narrative   ** Merged History Encounter **       The patient has 3  children. He lives in [place] [facility]. 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 He is a remote smoker. He drinks very rare alcohol. He has grandchildren and great-grandchildren in the area.    Family History  Problem Relation Age of Onset  . Cancer Maternal Aunt     unknown  . Cancer Maternal Grandmother     unknown  . Aneurysm Brother   . Stroke Father     cerebral hemorrhage    ROS: patient extremely confused but denies fevers or chills, productive cough, hemoptysis, dysphasia, odynophagia, melena, hematochezia, dysuria, hematuria, rash, seizure activity, orthopnea, PND, pedal edema, claudication. Remaining systems are negative.  Physical Exam:   Blood pressure 159/67, pulse 86, temperature 97.5 F (36.4 C), temperature source Oral, resp. rate 20, height '6\' 1"'  (1.854 m), weight 193 lb 6.4 oz (87.726 kg), SpO2 98 %.  General:  Well developed/chronically ill appearing in NAD Skin warm/dry No peripheral clubbing Back-not assessed HEENT-normal/normal eyelids Neck supple/normal carotid upstroke bilaterally; no bruits; no JVD; no thyromegaly chest - CTA/ normal expansion CV - irregular/normal S1 and S2; no murmurs, rubs or gallops;  PMI nondisplaced Abdomen -NT/ND, no HSM, no mass, + bowel sounds, no bruit Ext-no edema, chords Neuro-confused; moves all ext  ECG Atrial fibrillation, prior inferior and septal infarct.  Results for orders placed or performed during the hospital encounter of 07/22/15 (from the past 48 hour(s))  POC occult blood, ED     Status: Abnormal   Collection Time: 07/22/15 12:05 PM  Result Value Ref Range   Fecal Occult Bld POSITIVE (A) NEGATIVE  Blood Culture (routine x 2)     Status: None (Preliminary result)   Collection Time: 07/22/15  1:06 PM  Result Value Ref Range   Specimen Description BLOOD LEFT HAND    Special Requests BOTTLES DRAWN AEROBIC AND ANAEROBIC 5CC    Culture      NO GROWTH 1  DAY Performed at [facility]    Report Status PENDING   Comprehensive metabolic panel     Status: Abnormal   Collection Time: 07/22/15  1:14 PM  Result Value Ref Range   Sodium 139 135 - 145 mmol/L   Potassium 3.8 3.5 - 5.1 mmol/L   Chloride 106 101 - 111 mmol/L   CO2 26 22 - 32 mmol/L   Glucose, Bld 115 (H) 65 - 99 mg/dL   BUN 23 (H) 6 - 20 mg/dL   Creatinine, Ser 1.08 0.61 - 1.24 mg/dL   Calcium 9.4 8.9 - 10.3 mg/dL   Total Protein 6.9 6.5 - 8.1 g/dL   Albumin 4.1 3.5 - 5.0  g/dL   AST 18 15 - 41 U/L   ALT 14 (L) 17 - 63 U/L   Alkaline Phosphatase 61 38 - 126 U/L   Total Bilirubin 1.1 0.3 - 1.2 mg/dL   GFR calc non Af Amer >60 >60 mL/min   GFR calc Af Amer >60 >60 mL/min    Comment: (NOTE) The eGFR has been calculated using the CKD EPI equation. This calculation has not been validated in all clinical situations. eGFR's persistently <60 mL/min signify possible Chronic Kidney Disease.    Anion gap 7 5 - 15  CBC WITH DIFFERENTIAL     Status: Abnormal   Collection Time: 07/22/15  1:14 PM  Result Value Ref Range   WBC 12.4 (H) 4.0 - 10.5 K/uL   RBC 5.13 4.22 - 5.81 MIL/uL   Hemoglobin 15.1 13.0 - 17.0 g/dL   HCT 45.4 39.0 - 52.0 %   MCV 88.5 78.0 - 100.0 fL   MCH 29.4 26.0 - 34.0 pg   MCHC 33.3 30.0 - 36.0 g/dL   RDW 14.6 11.5 - 15.5 %   Platelets 211 150 - 400 K/uL   Neutrophils Relative % 81 %   Lymphocytes Relative 10 %   Monocytes Relative 7 %   Eosinophils Relative 2 %   Basophils Relative 0 %   Neutro Abs 10.1 (H) 1.7 - 7.7 K/uL   Lymphs Abs 1.2 0.7 - 4.0 K/uL   Monocytes Absolute 0.9 0.1 - 1.0 K/uL   Eosinophils Absolute 0.2 0.0 - 0.7 K/uL   Basophils Absolute 0.0 0.0 - 0.1 K/uL   Smear Review MORPHOLOGY UNREMARKABLE   Blood Culture (routine x 2)     Status: None (Preliminary result)   Collection Time: 07/22/15  1:14 PM  Result Value Ref Range   Specimen Description BLOOD LEFT ANTECUBITAL    Special Requests BOTTLES DRAWN AEROBIC AND ANAEROBIC 5CC     Culture      NO GROWTH 1 DAY Performed at [facility]    Report Status PENDING   Type and screen     Status: None   Collection Time: 07/22/15  1:14 PM  Result Value Ref Range   ABO/RH(D) A POS    Antibody Screen NEG    Sample Expiration 07/25/2015   ABO/Rh     Status: None   Collection Time: 07/22/15  1:14 PM  Result Value Ref Range   ABO/RH(D) A POS   Magnesium     Status: None   Collection Time: 07/22/15  1:14 PM  Result Value Ref Range   Magnesium 2.2 1.7 - 2.4 mg/dL  I-Stat CG4 Lactic Acid, ED  (not at  [facility])     Status: None   Collection Time: 07/22/15  1:26 PM  Result Value Ref Range   Lactic Acid, Venous 1.77 0.5 - 2.0 mmol/L  I-Stat CG4 Lactic Acid, ED  (not at  [facility])     Status: None   Collection Time: 07/22/15  3:36 PM  Result Value Ref Range   Lactic Acid, Venous 0.67 0.5 - 2.0 mmol/L  Urinalysis, Routine w reflex microscopic (not at [facility])     Status: Abnormal   Collection Time: 07/22/15  3:55 PM  Result Value Ref Range   Color, Urine YELLOW YELLOW   APPearance CLOUDY (A) CLEAR   Specific Gravity, Urine 1.015 1.005 - 1.030   pH 5.5 5.0 - 8.0   Glucose, UA NEGATIVE NEGATIVE mg/dL   Hgb urine dipstick MODERATE (A) NEGATIVE   Bilirubin  Urine NEGATIVE NEGATIVE   Ketones, ur NEGATIVE NEGATIVE mg/dL   Protein, ur NEGATIVE NEGATIVE mg/dL   Nitrite POSITIVE (A) NEGATIVE   Leukocytes, UA MODERATE (A) NEGATIVE  Urine culture     Status: Abnormal (Preliminary result)   Collection Time: 07/22/15  3:55 PM  Result Value Ref Range   Specimen Description URINE, CATHETERIZED    Special Requests NONE    Culture (A)     >=100,000 COLONIES/mL GRAM NEGATIVE RODS CULTURE REINCUBATED FOR BETTER GROWTH Performed at [facility]    Report Status PENDING   Urine microscopic-add on     Status: Abnormal   Collection Time: 07/22/15  3:55 PM  Result Value Ref Range   Squamous Epithelial / LPF 0-5 (A) NONE SEEN   WBC, UA 6-30 0 - 5 WBC/hpf   RBC / HPF 6-30 0 - 5  RBC/hpf   Bacteria, UA MANY (A) NONE SEEN  Basic metabolic panel     Status: Abnormal   Collection Time: 07/23/15  5:25 AM  Result Value Ref Range   Sodium 139 135 - 145 mmol/L   Potassium 3.8 3.5 - 5.1 mmol/L   Chloride 108 101 - 111 mmol/L   CO2 26 22 - 32 mmol/L   Glucose, Bld 105 (H) 65 - 99 mg/dL   BUN 20 6 - 20 mg/dL   Creatinine, Ser 0.95 0.61 - 1.24 mg/dL   Calcium 8.9 8.9 - 10.3 mg/dL   GFR calc non Af Amer >60 >60 mL/min   GFR calc Af Amer >60 >60 mL/min    Comment: (NOTE) The eGFR has been calculated using the CKD EPI equation. This calculation has not been validated in all clinical situations. eGFR's persistently <60 mL/min signify possible Chronic Kidney Disease.    Anion gap 5 5 - 15  CBC     Status: Abnormal   Collection Time: 07/23/15  5:25 AM  Result Value Ref Range   WBC 14.3 (H) 4.0 - 10.5 K/uL   RBC 4.63 4.22 - 5.81 MIL/uL   Hemoglobin 13.4 13.0 - 17.0 g/dL   HCT 41.9 39.0 - 52.0 %   MCV 90.5 78.0 - 100.0 fL   MCH 28.9 26.0 - 34.0 pg   MCHC 32.0 30.0 - 36.0 g/dL   RDW 14.7 11.5 - 15.5 %   Platelets 187 150 - 400 K/uL  TSH     Status: None   Collection Time: 07/23/15  7:14 PM  Result Value Ref Range   TSH 1.983 0.350 - 4.500 uIU/mL  CBC     Status: Abnormal   Collection Time: 07/24/15  4:30 AM  Result Value Ref Range   WBC 15.4 (H) 4.0 - 10.5 K/uL   RBC 4.79 4.22 - 5.81 MIL/uL   Hemoglobin 13.7 13.0 - 17.0 g/dL   HCT 42.9 39.0 - 52.0 %   MCV 89.6 78.0 - 100.0 fL   MCH 28.6 26.0 - 34.0 pg   MCHC 31.9 30.0 - 36.0 g/dL   RDW 14.8 11.5 - 15.5 %   Platelets 188 150 - 400 K/uL    Dg Chest 2 View  07/22/2015  CLINICAL DATA:  Back pain.  Altered mental status. EXAM: CHEST  2 VIEW COMPARISON:  05/30/2015 chest radiograph. FINDINGS: Surgical clips and sutures overlie the left parahilar region. Stable cardiomediastinal silhouette with top-normal heart size. No pneumothorax. No pleural effusion. Lungs appear clear, with no acute consolidative airspace  disease and no pulmonary edema. IMPRESSION: No active cardiopulmonary disease. Electronically Signed  By: Ilona Sorrel M.D.   On: 07/22/2015 12:15   Ct Head Wo Contrast  07/22/2015  CLINICAL DATA:  Confusion EXAM: CT HEAD WITHOUT CONTRAST TECHNIQUE: Contiguous axial images were obtained from the base of the skull through the vertex without intravenous contrast. COMPARISON:  06/23/2015 FINDINGS: Bony calvarium is intact. No gross soft tissue abnormality is noted. Paranasal sinuses are well aerated. Diffuse atrophic changes are again seen as are chronic white matter ischemic changes. These are stable in appearance from the prior exam. No findings to suggest acute hemorrhage, acute infarction or space-occupying mass lesion are noted. IMPRESSION: Chronic atrophic and ischemic changes without acute intracranial abnormality. Electronically Signed   By: Inez Catalina M.D.   On: 07/22/2015 12:36   Dg Chest Port 1 View  07/23/2015  CLINICAL DATA:  Evaluate central line placement. EXAM: PORTABLE CHEST 1 VIEW COMPARISON:  Chest radiograph 07/22/2015. FINDINGS: Right upper extremity PICC line tip projects over the superior vena cava. Stable cardiomegaly. Tortuosity of thoracic aorta. No consolidative pulmonary opacities. No pleural effusion or pneumothorax. Mitral annular calcifications. IMPRESSION: Right upper extremity PICC line tip projects over the superior vena cava. Electronically Signed   By: Lovey Newcomer M.D.   On: 07/23/2015 09:18    Assessment/Plan 1 permanent atrial fibrillation-patient is not on anticoagulation because of dementia and falls. CHADSvasc 5. His heart rate is occasionally in the high 30s and low 40s in reviewing telemetry. This is predominantly while asleep. Agree with discontinuing metoprolol and following. No indication for pacing as he is not symptomatic as far as I can tell. He would be a poor candidate for pacemaker given recurrent UTIs and mental status. 2 urinary tract  infection-management per primary care. 3 altered mental status/dementia 4 hypertension-Discontinue metoprolol given bradycardia.  If blood pressure increases could add amlodipine.  Kirk Ruths MD 07/24/2015, 7:32 AM

## 2015-07-24 NOTE — Discharge Instructions (Signed)
Follow with Primary MD [name], MD and your Urologist in 7 days   Get CBC, CMP, 2 view Chest X ray checked  by Primary MD next visit.    Activity: As tolerated with Full fall precautions use walker/cane & assistance as needed   Disposition Home     Diet:   Heart Healthy with feeding assistance and aspiration precautions.  For Heart failure patients - Check your Weight same time everyday, if you gain over 2 pounds, or you develop in leg swelling, experience more shortness of breath or chest pain, call your Primary MD immediately. Follow Cardiac Low Salt Diet and 1.5 lit/day fluid restriction.   On your next visit with your primary care physician please Get Medicines reviewed and adjusted.   Please request your Prim.MD to go over all Hospital Tests and Procedure/Radiological results at the follow up, please get all Hospital records sent to your Prim MD by signing hospital release before you go home.   If you experience worsening of your admission symptoms, develop shortness of breath, life threatening emergency, suicidal or homicidal thoughts you must seek medical attention immediately by calling 911 or calling your MD immediately  if symptoms less severe.  You Must read complete instructions/literature along with all the possible adverse reactions/side effects for all the Medicines you take and that have been prescribed to you. Take any new Medicines after you have completely understood and accpet all the possible adverse reactions/side effects.   Do not drive, operate heavy machinery, perform activities at heights, swimming or participation in water activities or provide baby sitting services if your were admitted for syncope or siezures until you have seen by Primary MD or a Neurologist and advised to do so again.  Do not drive when taking Pain medications.    Do not take more than prescribed Pain, Sleep and Anxiety Medications  Special Instructions: If you have smoked or  chewed Tobacco  in the last 2 yrs please stop smoking, stop any regular Alcohol  and or any Recreational drug use.  Wear Seat belts while driving.   Please note  You were cared for by a hospitalist during your hospital stay. If you have any questions about your discharge medications or the care you received while you were in the hospital after you are discharged, you can call the unit and asked to speak with the hospitalist on call if the hospitalist that took care of you is not available. Once you are discharged, your primary care physician will handle any further medical issues. Please note that NO REFILLS for any discharge medications will be authorized once you are discharged, as it is imperative that you return to your primary care physician (or establish a relationship with a primary care physician if you do not have one) for your aftercare needs so that they can reassess your need for medications and monitor your lab values.

## 2015-07-24 NOTE — Progress Notes (Signed)
Pt had leaking coming from 84F/ 10cc foley cat, oncall provider Schorr, wrote an order to DC current foley and to insert 34f 30cc balloon. Pt tolerated the procedure without incident.

## 2015-07-25 DIAGNOSIS — B965 Pseudomonas (aeruginosa) (mallei) (pseudomallei) as the cause of diseases classified elsewhere: Secondary | ICD-10-CM | POA: Diagnosis not present

## 2015-07-25 DIAGNOSIS — N390 Urinary tract infection, site not specified: Secondary | ICD-10-CM | POA: Diagnosis not present

## 2015-07-25 DIAGNOSIS — R2689 Other abnormalities of gait and mobility: Secondary | ICD-10-CM | POA: Diagnosis not present

## 2015-07-25 DIAGNOSIS — M6281 Muscle weakness (generalized): Secondary | ICD-10-CM | POA: Diagnosis not present

## 2015-07-25 DIAGNOSIS — C679 Malignant neoplasm of bladder, unspecified: Secondary | ICD-10-CM | POA: Diagnosis not present

## 2015-07-25 DIAGNOSIS — I10 Essential (primary) hypertension: Secondary | ICD-10-CM | POA: Diagnosis not present

## 2015-07-25 LAB — URINE CULTURE

## 2015-07-27 LAB — CULTURE, BLOOD (ROUTINE X 2)
CULTURE: NO GROWTH
CULTURE: NO GROWTH

## 2015-07-28 DIAGNOSIS — R2689 Other abnormalities of gait and mobility: Secondary | ICD-10-CM | POA: Diagnosis not present

## 2015-07-28 DIAGNOSIS — M6281 Muscle weakness (generalized): Secondary | ICD-10-CM | POA: Diagnosis not present

## 2015-07-28 DIAGNOSIS — I10 Essential (primary) hypertension: Secondary | ICD-10-CM | POA: Diagnosis not present

## 2015-07-28 DIAGNOSIS — B965 Pseudomonas (aeruginosa) (mallei) (pseudomallei) as the cause of diseases classified elsewhere: Secondary | ICD-10-CM | POA: Diagnosis not present

## 2015-07-28 DIAGNOSIS — N390 Urinary tract infection, site not specified: Secondary | ICD-10-CM | POA: Diagnosis not present

## 2015-07-28 DIAGNOSIS — C679 Malignant neoplasm of bladder, unspecified: Secondary | ICD-10-CM | POA: Diagnosis not present

## 2015-07-30 DIAGNOSIS — I10 Essential (primary) hypertension: Secondary | ICD-10-CM | POA: Diagnosis not present

## 2015-07-30 DIAGNOSIS — B965 Pseudomonas (aeruginosa) (mallei) (pseudomallei) as the cause of diseases classified elsewhere: Secondary | ICD-10-CM | POA: Diagnosis not present

## 2015-07-30 DIAGNOSIS — M6281 Muscle weakness (generalized): Secondary | ICD-10-CM | POA: Diagnosis not present

## 2015-07-30 DIAGNOSIS — N390 Urinary tract infection, site not specified: Secondary | ICD-10-CM | POA: Diagnosis not present

## 2015-07-30 DIAGNOSIS — R2689 Other abnormalities of gait and mobility: Secondary | ICD-10-CM | POA: Diagnosis not present

## 2015-07-30 DIAGNOSIS — C679 Malignant neoplasm of bladder, unspecified: Secondary | ICD-10-CM | POA: Diagnosis not present

## 2015-08-04 ENCOUNTER — Emergency Department (HOSPITAL_COMMUNITY)
Admission: EM | Admit: 2015-08-04 | Discharge: 2015-08-04 | Disposition: A | Discharge status: Home / Self Care | Payer: Medicare Other | Source: Home / Self Care | Attending: Emergency Medicine | Admitting: Emergency Medicine

## 2015-08-04 ENCOUNTER — Encounter (HOSPITAL_COMMUNITY): Payer: Self-pay

## 2015-08-04 DIAGNOSIS — E785 Hyperlipidemia, unspecified: Secondary | ICD-10-CM | POA: Insufficient documentation

## 2015-08-04 DIAGNOSIS — F329 Major depressive disorder, single episode, unspecified: Secondary | ICD-10-CM | POA: Insufficient documentation

## 2015-08-04 DIAGNOSIS — Z8551 Personal history of malignant neoplasm of bladder: Secondary | ICD-10-CM | POA: Insufficient documentation

## 2015-08-04 DIAGNOSIS — M6281 Muscle weakness (generalized): Secondary | ICD-10-CM | POA: Diagnosis not present

## 2015-08-04 DIAGNOSIS — Z87891 Personal history of nicotine dependence: Secondary | ICD-10-CM | POA: Insufficient documentation

## 2015-08-04 DIAGNOSIS — Z8673 Personal history of transient ischemic attack (TIA), and cerebral infarction without residual deficits: Secondary | ICD-10-CM

## 2015-08-04 DIAGNOSIS — Y733 Surgical instruments, materials and gastroenterology and urology devices (including sutures) associated with adverse incidents: Secondary | ICD-10-CM | POA: Insufficient documentation

## 2015-08-04 DIAGNOSIS — R410 Disorientation, unspecified: Secondary | ICD-10-CM | POA: Diagnosis not present

## 2015-08-04 DIAGNOSIS — Z79899 Other long term (current) drug therapy: Secondary | ICD-10-CM

## 2015-08-04 DIAGNOSIS — F0390 Unspecified dementia without behavioral disturbance: Secondary | ICD-10-CM | POA: Diagnosis not present

## 2015-08-04 DIAGNOSIS — I10 Essential (primary) hypertension: Secondary | ICD-10-CM | POA: Diagnosis not present

## 2015-08-04 DIAGNOSIS — T839XXA Unspecified complication of genitourinary prosthetic device, implant and graft, initial encounter: Secondary | ICD-10-CM

## 2015-08-04 DIAGNOSIS — C679 Malignant neoplasm of bladder, unspecified: Secondary | ICD-10-CM | POA: Diagnosis not present

## 2015-08-04 DIAGNOSIS — R2689 Other abnormalities of gait and mobility: Secondary | ICD-10-CM | POA: Diagnosis not present

## 2015-08-04 DIAGNOSIS — I4891 Unspecified atrial fibrillation: Secondary | ICD-10-CM

## 2015-08-04 DIAGNOSIS — T83098A Other mechanical complication of other indwelling urethral catheter, initial encounter: Secondary | ICD-10-CM | POA: Diagnosis not present

## 2015-08-04 DIAGNOSIS — Z7982 Long term (current) use of aspirin: Secondary | ICD-10-CM | POA: Insufficient documentation

## 2015-08-04 DIAGNOSIS — Z85118 Personal history of other malignant neoplasm of bronchus and lung: Secondary | ICD-10-CM | POA: Insufficient documentation

## 2015-08-04 DIAGNOSIS — B965 Pseudomonas (aeruginosa) (mallei) (pseudomallei) as the cause of diseases classified elsewhere: Secondary | ICD-10-CM | POA: Diagnosis not present

## 2015-08-04 DIAGNOSIS — T83091A Other mechanical complication of indwelling urethral catheter, initial encounter: Secondary | ICD-10-CM

## 2015-08-04 DIAGNOSIS — R339 Retention of urine, unspecified: Secondary | ICD-10-CM

## 2015-08-04 DIAGNOSIS — R531 Weakness: Secondary | ICD-10-CM | POA: Diagnosis not present

## 2015-08-04 DIAGNOSIS — N390 Urinary tract infection, site not specified: Secondary | ICD-10-CM | POA: Diagnosis not present

## 2015-08-04 DIAGNOSIS — G92 Toxic encephalopathy: Secondary | ICD-10-CM | POA: Diagnosis not present

## 2015-08-04 DIAGNOSIS — R4182 Altered mental status, unspecified: Secondary | ICD-10-CM | POA: Diagnosis not present

## 2015-08-04 LAB — URINALYSIS, ROUTINE W REFLEX MICROSCOPIC
Bilirubin Urine: NEGATIVE
GLUCOSE, UA: NEGATIVE mg/dL
Ketones, ur: NEGATIVE mg/dL
NITRITE: NEGATIVE
PH: 6 (ref 5.0–8.0)
Protein, ur: 100 mg/dL — AB
SPECIFIC GRAVITY, URINE: 1.014 (ref 1.005–1.030)

## 2015-08-04 LAB — URINE MICROSCOPIC-ADD ON

## 2015-08-04 MED ORDER — LIDOCAINE HCL 2 % EX GEL
1.0000 "application " | Freq: Once | CUTANEOUS | Status: AC
  Administered 2015-08-04: 1 via URETHRAL
  Filled 2015-08-04: qty 11

## 2015-08-04 NOTE — ED Notes (Signed)
After Marcie Bal NT placed foley catheter, patient was urinating through the foley catheter tube but bleeding with clots around the catheter. Marcie Bal NT notified Dr. Colonel Bald and suggested an ice pack over genitals. Performed patient hygiene and placed an ice pack over the genitals. Informed patient and family member about the status of what is talking place.

## 2015-08-04 NOTE — Discharge Instructions (Signed)
Apply ice pack to a gentle area. Follow-up with urologist.

## 2015-08-04 NOTE — ED Notes (Signed)
Spoke with Dr. Colonel Bald about patients condition and possible caused trauma. He reports he will come to assess before inserting foley catheter.

## 2015-08-04 NOTE — ED Notes (Signed)
Pt has chronic urinary cath.  Accidentally pulled out last night.  Home health unable to get back.  Meatus is bleeding.

## 2015-08-04 NOTE — ED Notes (Signed)
POA declined discharge vitals

## 2015-08-04 NOTE — ED Notes (Signed)
Leg bag placed on pt for home.

## 2015-08-04 NOTE — ED Provider Notes (Signed)
CSN: [id number]     Arrival date & time 08/04/15  1521 History   First MD Initiated Contact with Patient 08/04/15 1607     Chief Complaint  Patient presents with  . Urinary Retention     (Consider location/radiation/quality/duration/timing/severity/associated sxs/prior Treatment) HPI...Marland KitchenMarland KitchenLevel V caveat for dementia. Patient pulled his Foley catheter out last night with the bulb inflated. There is some bleeding around his urethra. No fever sweats or chills. Patient has a chronic urinary catheter.  Severity is moderate.  Past Medical History  Diagnosis Date  . Atrial fibrillation ([place])   . Hyperlipidemia   . Hypertension   . UTI (urinary tract infection)   . Dementia   . Bladder cancer ([facility])     hx of surgery and chemo  . Lung cancer [facility])     Status post surgery resection  . Diverticulitis   . Depression 08/19/2014  . CVA (cerebral infarction)    Past Surgical History  Procedure Laterality Date  . Hernia repair  2006  . Cataract extraction, bilateral  1998, 2000  . Cystectomy  2002  . Lung removal, partial     Family History  Problem Relation Age of Onset  . Cancer Maternal Aunt     unknown  . Cancer Maternal Grandmother     unknown  . Aneurysm Brother   . Stroke Father     cerebral hemorrhage   Social History  Substance Use Topics  . Smoking status: Former Smoker    Types: Cigarettes    Quit date: 10/10/1991  . Smokeless tobacco: Never Used  . Alcohol Use: No    Review of Systems  Reason unable to perform ROS: Dementia.      Allergies  Sulfa antibiotics and Sulfonamide derivatives  Home Medications   Prior to Admission medications   Medication Sig Start Date End Date Taking? Authorizing Provider  ALPHA LIPOIC ACID PO Take 200 mg by mouth every morning.    Yes Historical Provider, MD  ascorbic acid (VITAMIN C) 1000 MG tablet Take 1,000 mg by mouth every morning.    Yes Historical Provider, MD  aspirin EC 81 MG tablet Take 81 mg by mouth daily with  breakfast.   Yes Historical Provider, MD  atorvastatin (LIPITOR) 10 MG tablet Take 10 mg by mouth at bedtime.    Yes Historical Provider, MD  buPROPion (WELLBUTRIN) 75 MG tablet Take 75 mg by mouth daily with breakfast.    Yes Historical Provider, MD  calcium carbonate (OS-CAL - DOSED IN MG OF ELEMENTAL CALCIUM) 1250 MG tablet Take 1 tablet by mouth daily with breakfast.    Yes Historical Provider, MD  ceFEPIme 2 g in dextrose 5 % 50 mL Inject 2 g into the vein every 12 (twelve) hours. Stop date 08/03/2015 07/24/15  Yes Thurnell Lose, MD  Cholecalciferol (EQL VITAMIN D3) 1000 units tablet Take 1,000 Units by mouth every morning.   Yes Historical Provider, MD  Cyanocobalamin (VITAMIN B 12 PO) Take 50 mcg by mouth every morning.    Yes Historical Provider, MD  hydrocortisone valerate cream (WESTCORT) 0.2 % Apply 1 application topically 2 (two) times daily as needed (itching).    Yes Historical Provider, MD  ibuprofen (ADVIL,MOTRIN) 400 MG tablet Take 400 mg by mouth every 6 (six) hours as needed for moderate pain.   Yes Historical Provider, MD  MELATONIN PO Take 3 mg by mouth at bedtime as needed (sleep).    Yes Historical Provider, MD  methenamine (HIPREX) 1 G tablet Take  1 g by mouth 2 (two) times daily.    Yes Historical Provider, MD  metoprolol succinate (TOPROL-XL) 25 MG 24 hr tablet Take 25 mg by mouth at bedtime.  07/06/15  Yes Historical Provider, MD  nitrofurantoin (MACRODANTIN) 100 MG capsule Resume 6/13 after antibiotics are done. Patient taking differently: Take 100 mg by mouth daily.  07/24/15  Yes Thurnell Lose, MD  saccharomyces boulardii (FLORASTOR) 250 MG capsule Take 1 capsule (250 mg total) by mouth 2 (two) times daily. Patient taking differently: Take 250 mg by mouth daily.  06/02/15  Yes Eugenie Filler, MD  sertraline (ZOLOFT) 100 MG tablet Take 100 mg by mouth daily with breakfast.    Yes Historical Provider, MD  amLODipine (NORVASC) 10 MG tablet Take 1 tablet (10 mg total) by  mouth daily. Patient not taking: Reported on 08/04/2015 07/24/15   Thurnell Lose, MD   BP 141/76 mmHg  Pulse 72  Temp(Src) 98 F (36.7 C) (Oral)  Resp 14  SpO2 96% Physical Exam  Constitutional: He is oriented to person, place, and time.  Pleasant, demented  HENT:  Head: Normocephalic and atraumatic.  Eyes: Conjunctivae and EOM are normal. Pupils are equal, round, and reactive to light.  Neck: Normal range of motion. Neck supple.  Cardiovascular: Normal rate and regular rhythm.   Pulmonary/Chest: Effort normal and breath sounds normal.  Abdominal: Soft. Bowel sounds are normal.  No suprapubic tenderness.  Genitourinary:  Patient is a small amount of bleeding at the meatus. His perineum is erythematous and inflamed.  Musculoskeletal: Normal range of motion.  Neurological: He is alert and oriented to person, place, and time.  Skin: Skin is warm and dry.  Psychiatric:  Cooperative  Nursing note and vitals reviewed.   ED Course  Procedures (including critical care time) Labs Review Labs Reviewed  URINALYSIS, ROUTINE W REFLEX MICROSCOPIC (NOT AT [facility]) - Abnormal; Notable for the following:    Color, Urine RED (*)    APPearance CLOUDY (*)    Hgb urine dipstick LARGE (*)    Protein, ur 100 (*)    Leukocytes, UA SMALL (*)    All other components within normal limits  URINE MICROSCOPIC-ADD ON - Abnormal; Notable for the following:    Squamous Epithelial / LPF 0-5 (*)    Bacteria, UA FEW (*)    All other components within normal limits  URINE CULTURE    Imaging Review No results found. I have personally reviewed and evaluated these images and lab results as part of my medical decision-making.   EKG Interpretation None      MDM   Final diagnoses:  Urinary retention  Foley catheter problem, initial encounter ([place])    Foley catheter replaced by RN. There was a large amount of urinary flow. A small amount of bleeding remained at the meatus. Patient has local urology  follow-up.    Nat Christen, MD 08/04/15 [phone]

## 2015-08-05 ENCOUNTER — Inpatient Hospital Stay (HOSPITAL_COMMUNITY)
Admission: EM | Admit: 2015-08-05 | Discharge: 2015-08-10 | DRG: 689 | Disposition: A | Discharge status: Skilled Nursing Facility | Payer: Medicare Other | Attending: Internal Medicine | Admitting: Internal Medicine

## 2015-08-05 ENCOUNTER — Encounter (HOSPITAL_COMMUNITY): Payer: Self-pay | Admitting: Emergency Medicine

## 2015-08-05 ENCOUNTER — Emergency Department (HOSPITAL_COMMUNITY): Payer: Medicare Other

## 2015-08-05 DIAGNOSIS — F0390 Unspecified dementia without behavioral disturbance: Secondary | ICD-10-CM | POA: Diagnosis present

## 2015-08-05 DIAGNOSIS — I4891 Unspecified atrial fibrillation: Secondary | ICD-10-CM | POA: Diagnosis present

## 2015-08-05 DIAGNOSIS — I10 Essential (primary) hypertension: Secondary | ICD-10-CM | POA: Diagnosis not present

## 2015-08-05 DIAGNOSIS — N390 Urinary tract infection, site not specified: Principal | ICD-10-CM | POA: Diagnosis present

## 2015-08-05 DIAGNOSIS — R4182 Altered mental status, unspecified: Secondary | ICD-10-CM | POA: Diagnosis not present

## 2015-08-05 DIAGNOSIS — Z8673 Personal history of transient ischemic attack (TIA), and cerebral infarction without residual deficits: Secondary | ICD-10-CM

## 2015-08-05 DIAGNOSIS — E785 Hyperlipidemia, unspecified: Secondary | ICD-10-CM | POA: Diagnosis present

## 2015-08-05 DIAGNOSIS — R2689 Other abnormalities of gait and mobility: Secondary | ICD-10-CM | POA: Diagnosis not present

## 2015-08-05 DIAGNOSIS — Z8551 Personal history of malignant neoplasm of bladder: Secondary | ICD-10-CM

## 2015-08-05 DIAGNOSIS — C679 Malignant neoplasm of bladder, unspecified: Secondary | ICD-10-CM | POA: Diagnosis present

## 2015-08-05 DIAGNOSIS — Z87891 Personal history of nicotine dependence: Secondary | ICD-10-CM

## 2015-08-05 DIAGNOSIS — M6281 Muscle weakness (generalized): Secondary | ICD-10-CM | POA: Diagnosis not present

## 2015-08-05 DIAGNOSIS — K219 Gastro-esophageal reflux disease without esophagitis: Secondary | ICD-10-CM | POA: Diagnosis present

## 2015-08-05 DIAGNOSIS — T361X5A Adverse effect of cephalosporins and other beta-lactam antibiotics, initial encounter: Secondary | ICD-10-CM | POA: Diagnosis present

## 2015-08-05 DIAGNOSIS — B965 Pseudomonas (aeruginosa) (mallei) (pseudomallei) as the cause of diseases classified elsewhere: Secondary | ICD-10-CM | POA: Diagnosis not present

## 2015-08-05 DIAGNOSIS — Z7982 Long term (current) use of aspirin: Secondary | ICD-10-CM

## 2015-08-05 DIAGNOSIS — L8990 Pressure ulcer of unspecified site, unspecified stage: Secondary | ICD-10-CM | POA: Diagnosis present

## 2015-08-05 DIAGNOSIS — Z79899 Other long term (current) drug therapy: Secondary | ICD-10-CM

## 2015-08-05 DIAGNOSIS — R410 Disorientation, unspecified: Secondary | ICD-10-CM | POA: Diagnosis not present

## 2015-08-05 DIAGNOSIS — G92 Toxic encephalopathy: Secondary | ICD-10-CM | POA: Diagnosis present

## 2015-08-05 DIAGNOSIS — R402411 Glasgow coma scale score 13-15, in the field [EMT or ambulance]: Secondary | ICD-10-CM | POA: Diagnosis not present

## 2015-08-05 DIAGNOSIS — G9341 Metabolic encephalopathy: Secondary | ICD-10-CM | POA: Diagnosis present

## 2015-08-05 DIAGNOSIS — F329 Major depressive disorder, single episode, unspecified: Secondary | ICD-10-CM | POA: Diagnosis present

## 2015-08-05 DIAGNOSIS — R531 Weakness: Secondary | ICD-10-CM | POA: Diagnosis not present

## 2015-08-05 DIAGNOSIS — F32A Depression, unspecified: Secondary | ICD-10-CM | POA: Diagnosis present

## 2015-08-05 LAB — URINALYSIS, ROUTINE W REFLEX MICROSCOPIC
Bilirubin Urine: NEGATIVE
Glucose, UA: NEGATIVE mg/dL
KETONES UR: NEGATIVE mg/dL
Nitrite: NEGATIVE
PROTEIN: 30 mg/dL — AB
Specific Gravity, Urine: 1.017 (ref 1.005–1.030)
pH: 6 (ref 5.0–8.0)

## 2015-08-05 LAB — COMPREHENSIVE METABOLIC PANEL
ALT: 38 U/L (ref 17–63)
AST: 23 U/L (ref 15–41)
Albumin: 4.2 g/dL (ref 3.5–5.0)
Alkaline Phosphatase: 117 U/L (ref 38–126)
Anion gap: 7 (ref 5–15)
BILIRUBIN TOTAL: 1.2 mg/dL (ref 0.3–1.2)
BUN: 20 mg/dL (ref 6–20)
CHLORIDE: 107 mmol/L (ref 101–111)
CO2: 27 mmol/L (ref 22–32)
CREATININE: 0.81 mg/dL (ref 0.61–1.24)
Calcium: 9.9 mg/dL (ref 8.9–10.3)
Glucose, Bld: 114 mg/dL — ABNORMAL HIGH (ref 65–99)
POTASSIUM: 3.8 mmol/L (ref 3.5–5.1)
Sodium: 141 mmol/L (ref 135–145)
TOTAL PROTEIN: 7.2 g/dL (ref 6.5–8.1)

## 2015-08-05 LAB — CBC WITH DIFFERENTIAL/PLATELET
Basophils Absolute: 0 10*3/uL (ref 0.0–0.1)
Basophils Relative: 0 %
EOS PCT: 1 %
Eosinophils Absolute: 0.1 10*3/uL (ref 0.0–0.7)
HCT: 43.8 % (ref 39.0–52.0)
Hemoglobin: 14.2 g/dL (ref 13.0–17.0)
LYMPHS ABS: 1.1 10*3/uL (ref 0.7–4.0)
LYMPHS PCT: 7 %
MCH: 29.3 pg (ref 26.0–34.0)
MCHC: 32.4 g/dL (ref 30.0–36.0)
MCV: 90.3 fL (ref 78.0–100.0)
MONO ABS: 0.6 10*3/uL (ref 0.1–1.0)
MONOS PCT: 4 %
Neutro Abs: 12.9 10*3/uL — ABNORMAL HIGH (ref 1.7–7.7)
Neutrophils Relative %: 88 %
PLATELETS: 182 10*3/uL (ref 150–400)
RBC: 4.85 MIL/uL (ref 4.22–5.81)
RDW: 15.1 % (ref 11.5–15.5)
WBC: 14.7 10*3/uL — ABNORMAL HIGH (ref 4.0–10.5)

## 2015-08-05 LAB — I-STAT CG4 LACTIC ACID, ED: LACTIC ACID, VENOUS: 0.83 mmol/L (ref 0.5–2.0)

## 2015-08-05 LAB — URINE MICROSCOPIC-ADD ON

## 2015-08-05 MED ORDER — SODIUM CHLORIDE 0.9 % IV SOLN
INTRAVENOUS | Status: DC
  Administered 2015-08-05: 19:00:00 via INTRAVENOUS

## 2015-08-05 MED ORDER — VANCOMYCIN HCL IN DEXTROSE 1-5 GM/200ML-% IV SOLN
1000.0000 mg | Freq: Two times a day (BID) | INTRAVENOUS | Status: DC
  Administered 2015-08-05 – 2015-08-07 (×4): 1000 mg via INTRAVENOUS
  Filled 2015-08-05 (×4): qty 200

## 2015-08-05 MED ORDER — VITAMIN D 1000 UNITS PO TABS
1000.0000 [IU] | ORAL_TABLET | Freq: Every day | ORAL | Status: DC
  Administered 2015-08-07 – 2015-08-10 (×4): 1000 [IU] via ORAL
  Filled 2015-08-05 (×5): qty 1

## 2015-08-05 MED ORDER — BUPROPION HCL 75 MG PO TABS
75.0000 mg | ORAL_TABLET | Freq: Every day | ORAL | Status: DC
  Administered 2015-08-07 – 2015-08-10 (×4): 75 mg via ORAL
  Filled 2015-08-05 (×8): qty 1

## 2015-08-05 MED ORDER — METHENAMINE HIPPURATE 1 G PO TABS: 1.0000 g | ORAL_TABLET | Freq: Two times a day (BID) | ORAL | Status: DC

## 2015-08-05 MED ORDER — CHOLECALCIFEROL 25 MCG (1000 UT) PO TABS: 1000.0000 [IU] | ORAL_TABLET | Freq: Every morning | ORAL | Status: DC

## 2015-08-05 MED ORDER — ATORVASTATIN CALCIUM 10 MG PO TABS
10.0000 mg | ORAL_TABLET | Freq: Every day | ORAL | Status: DC
  Administered 2015-08-05 – 2015-08-09 (×5): 10 mg via ORAL
  Filled 2015-08-05 (×5): qty 1

## 2015-08-05 MED ORDER — SODIUM CHLORIDE 0.9 % IV SOLN
500.0000 mg | Freq: Once | INTRAVENOUS | Status: AC
  Administered 2015-08-05: 500 mg via INTRAVENOUS
  Filled 2015-08-05: qty 500

## 2015-08-05 MED ORDER — CALCIUM CARBONATE 1250 (500 CA) MG PO TABS
1.0000 | ORAL_TABLET | Freq: Every day | ORAL | Status: DC
  Administered 2015-08-07 – 2015-08-10 (×4): 500 mg via ORAL
  Filled 2015-08-05 (×6): qty 1

## 2015-08-05 MED ORDER — METHENAMINE MANDELATE 1 G PO TABS
1000.0000 mg | ORAL_TABLET | Freq: Two times a day (BID) | ORAL | Status: DC
  Administered 2015-08-05 – 2015-08-10 (×9): 1000 mg via ORAL
  Filled 2015-08-05 (×11): qty 1

## 2015-08-05 MED ORDER — SODIUM CHLORIDE 0.9 % IV SOLN
INTRAVENOUS | Status: DC
  Administered 2015-08-05 – 2015-08-07 (×4): via INTRAVENOUS

## 2015-08-05 MED ORDER — SODIUM CHLORIDE 0.9 % IV BOLUS (SEPSIS)
1000.0000 mL | Freq: Once | INTRAVENOUS | Status: AC
  Administered 2015-08-05: 1000 mL via INTRAVENOUS

## 2015-08-05 MED ORDER — METOPROLOL SUCCINATE ER 25 MG PO TB24
25.0000 mg | ORAL_TABLET | Freq: Every day | ORAL | Status: DC
  Administered 2015-08-05: 25 mg via ORAL
  Filled 2015-08-05: qty 1

## 2015-08-05 MED ORDER — SODIUM CHLORIDE 0.9 % IV SOLN
500.0000 mg | Freq: Four times a day (QID) | INTRAVENOUS | Status: DC
  Administered 2015-08-06 – 2015-08-10 (×18): 500 mg via INTRAVENOUS
  Filled 2015-08-05 (×20): qty 500

## 2015-08-05 MED ORDER — SERTRALINE HCL 100 MG PO TABS
100.0000 mg | ORAL_TABLET | Freq: Every day | ORAL | Status: DC
  Administered 2015-08-07 – 2015-08-10 (×4): 100 mg via ORAL
  Filled 2015-08-05 (×5): qty 1

## 2015-08-05 MED ORDER — VANCOMYCIN HCL IN DEXTROSE 1-5 GM/200ML-% IV SOLN: 1000.0000 mg | Freq: Two times a day (BID) | INTRAVENOUS | Status: DC

## 2015-08-05 MED ORDER — ASPIRIN EC 81 MG PO TBEC
81.0000 mg | DELAYED_RELEASE_TABLET | Freq: Every day | ORAL | Status: DC
  Administered 2015-08-07 – 2015-08-08 (×2): 81 mg via ORAL
  Filled 2015-08-05 (×5): qty 1

## 2015-08-05 MED ORDER — MELATONIN 3 MG PO TABS: 3.0000 mg | ORAL_TABLET | Freq: Every evening | ORAL | Status: DC | PRN

## 2015-08-05 MED ORDER — ENOXAPARIN SODIUM 40 MG/0.4ML ~~LOC~~ SOLN
40.0000 mg | SUBCUTANEOUS | Status: DC
  Administered 2015-08-05 – 2015-08-09 (×5): 40 mg via SUBCUTANEOUS
  Filled 2015-08-05 (×5): qty 0.4

## 2015-08-05 NOTE — ED Notes (Signed)
MD at bedside. 

## 2015-08-05 NOTE — ED Provider Notes (Signed)
CSN: [id number]     Arrival date & time 08/05/15  1701 History   First MD Initiated Contact with Patient 08/05/15 1724     Chief Complaint  Patient presents with  . Altered Mental Status   PT IS A 82 YO MALE WITH DEMENTIA WHO IS HERE WITH HIS GRANDDAUGHTER WHO IS HIS POA.  THE PT HAS A HX OF CHRONIC UTIS FROM AN INDWELLING CATHETER.  THE PT WAS HERE YESTERDAY B/C HE PULLED OUT HIS FOLEY.  THE FOLEY WAS REPLACED.  THE PT HAS A PICC LINE IN HIS RIGHT LINE ARM AND HAS BEEN RECEIVING IV ABX (CEFEPIME) FOR AN UTI FOR THE PAST 2 WEEKS.  PT DENIES ANY PAIN.  (Consider location/radiation/quality/duration/timing/severity/associated sxs/prior Treatment) Patient is a 82 y.o. male presenting with altered mental status. The history is provided by a relative.  Altered Mental Status Presenting symptoms: behavior changes, combativeness, confusion, disorientation and lethargy   Severity:  Moderate Most recent episode:  Today Episode history:  Multiple Timing:  Intermittent Progression:  Waxing and waning Context: dementia and recent infection   Associated symptoms: weakness     Past Medical History  Diagnosis Date  . Atrial fibrillation ([place])   . Hyperlipidemia   . Hypertension   . UTI (urinary tract infection)   . Dementia   . Bladder cancer ([facility])     hx of surgery and chemo  . Lung cancer [facility])     Status post surgery resection  . Diverticulitis   . Depression 08/19/2014  . CVA (cerebral infarction)    Past Surgical History  Procedure Laterality Date  . Hernia repair  2006  . Cataract extraction, bilateral  1998, 2000  . Cystectomy  2002  . Lung removal, partial     Family History  Problem Relation Age of Onset  . Cancer Maternal Aunt     unknown  . Cancer Maternal Grandmother     unknown  . Aneurysm Brother   . Stroke Father     cerebral hemorrhage   Social History  Substance Use Topics  . Smoking status: Former Smoker    Types: Cigarettes    Quit date: 10/10/1991  .  Smokeless tobacco: Never Used  . Alcohol Use: No    Review of Systems  Neurological: Positive for weakness.  Psychiatric/Behavioral: Positive for confusion.  All other systems reviewed and are negative.     Allergies  Sulfa antibiotics and Sulfonamide derivatives  Home Medications   Prior to Admission medications   Medication Sig Start Date End Date Taking? Authorizing Provider  ALPHA LIPOIC ACID PO Take 200 mg by mouth every morning.    Yes Historical Provider, MD  ascorbic acid (VITAMIN C) 1000 MG tablet Take 1,000 mg by mouth every morning.    Yes Historical Provider, MD  aspirin EC 81 MG tablet Take 81 mg by mouth daily with breakfast.   Yes Historical Provider, MD  atorvastatin (LIPITOR) 10 MG tablet Take 10 mg by mouth at bedtime.    Yes Historical Provider, MD  buPROPion (WELLBUTRIN) 75 MG tablet Take 75 mg by mouth daily with breakfast.    Yes Historical Provider, MD  calcium carbonate (OS-CAL - DOSED IN MG OF ELEMENTAL CALCIUM) 1250 MG tablet Take 1 tablet by mouth daily with breakfast.    Yes Historical Provider, MD  Cholecalciferol (EQL VITAMIN D3) 1000 units tablet Take 1,000 Units by mouth every morning.   Yes Historical Provider, MD  Cyanocobalamin (VITAMIN B 12 PO) Take 50 mcg by mouth  every morning.    Yes Historical Provider, MD  MELATONIN PO Take 3 mg by mouth at bedtime as needed (sleep).    Yes Historical Provider, MD  methenamine (HIPREX) 1 G tablet Take 1 g by mouth 2 (two) times daily.    Yes Historical Provider, MD  metoprolol succinate (TOPROL-XL) 25 MG 24 hr tablet Take 25 mg by mouth at bedtime.  07/06/15  Yes Historical Provider, MD  nitrofurantoin (MACRODANTIN) 100 MG capsule Resume 6/13 after antibiotics are done. Patient taking differently: Take 100 mg by mouth daily.  07/24/15  Yes Thurnell Lose, MD  saccharomyces boulardii (FLORASTOR) 250 MG capsule Take 1 capsule (250 mg total) by mouth 2 (two) times daily. Patient taking differently: Take 250 mg by  mouth daily.  06/02/15  Yes Eugenie Filler, MD  sertraline (ZOLOFT) 100 MG tablet Take 100 mg by mouth daily with breakfast.    Yes Historical Provider, MD  amLODipine (NORVASC) 10 MG tablet Take 1 tablet (10 mg total) by mouth daily. Patient not taking: Reported on 08/04/2015 07/24/15   Thurnell Lose, MD  ceFEPIme 2 g in dextrose 5 % 50 mL Inject 2 g into the vein every 12 (twelve) hours. Stop date 08/03/2015 Patient not taking: Reported on 08/05/2015 07/24/15   Thurnell Lose, MD  ciprofloxacin (CIPRO) 500 MG tablet Take 500 mg by mouth every 12 (twelve) hours.  08/05/15   Historical Provider, MD  hydrocortisone valerate cream (WESTCORT) 0.2 % Apply 1 application topically 2 (two) times daily as needed (itching).     Historical Provider, MD  ibuprofen (ADVIL,MOTRIN) 400 MG tablet Take 400 mg by mouth every 6 (six) hours as needed for moderate pain.    Historical Provider, MD   BP 159/98 mmHg  Pulse 85  Temp(Src) 98.7 F (37.1 C) (Oral)  Resp 20  SpO2 98% Physical Exam  Constitutional: He appears well-developed and well-nourished.  HENT:  Head: Normocephalic and atraumatic.  Right Ear: External ear normal.  Left Ear: External ear normal.  Mouth/Throat: Mucous membranes are dry.  Eyes: Conjunctivae and EOM are normal. Pupils are equal, round, and reactive to light.  Neck: Normal range of motion. Neck supple.  Cardiovascular: Normal rate, normal heart sounds and intact distal pulses.  An irregularly irregular rhythm present.  Pulmonary/Chest: Effort normal and breath sounds normal.  Abdominal: Soft. Bowel sounds are normal.  Musculoskeletal: Normal range of motion.  Neurological: He is alert.  ORIENTED PER NORM  Skin: Skin is warm and dry.  Nursing note and vitals reviewed.   ED Course  Procedures (including critical care time) Labs Review Labs Reviewed  CBC WITH DIFFERENTIAL/PLATELET - Abnormal; Notable for the following:    WBC 14.7 (*)    Neutro Abs 12.9 (*)    All other  components within normal limits  COMPREHENSIVE METABOLIC PANEL - Abnormal; Notable for the following:    Glucose, Bld 114 (*)    All other components within normal limits  URINALYSIS, ROUTINE W REFLEX MICROSCOPIC (NOT AT [facility]) - Abnormal; Notable for the following:    APPearance TURBID (*)    Hgb urine dipstick LARGE (*)    Protein, ur 30 (*)    Leukocytes, UA LARGE (*)    All other components within normal limits  URINE MICROSCOPIC-ADD ON - Abnormal; Notable for the following:    Squamous Epithelial / LPF 0-5 (*)    Bacteria, UA RARE (*)    All other components within normal limits  URINE CULTURE  CULTURE, BLOOD (ROUTINE X 2)  CULTURE, BLOOD (ROUTINE X 2)  BASIC METABOLIC PANEL  CBC  I-STAT CG4 LACTIC ACID, ED    Imaging Review Dg Chest 2 View  08/05/2015  CLINICAL DATA:  Mental status changes EXAM: CHEST  2 VIEW COMPARISON:  07/23/2015 FINDINGS: Cardiomegaly is noted. Study is limited by poor inspiration. Mild basilar atelectasis. Central mild vascular congestion without pulmonary edema. Stable right arm PICC line position. Mild degenerative changes bilateral shoulders. Atherosclerotic calcifications of thoracic aorta. Mitral valve calcifications. IMPRESSION: Right arm PICC line with tip in SVC right atrium junction. Cardiomegaly. Poor inspiration. Mild basilar atelectasis. No infiltrate or pulmonary edema. Electronically Signed   By: Lahoma Crocker M.D.   On: 08/05/2015 18:20   Ct Head Wo Contrast  08/05/2015  CLINICAL DATA:  Mental status changes, history of dementia EXAM: CT HEAD WITHOUT CONTRAST TECHNIQUE: Contiguous axial images were obtained from the base of the skull through the vertex without intravenous contrast. COMPARISON:  07/22/2015 FINDINGS: Brain: No intracranial hemorrhage, mass effect or midline shift. Stable atrophy and chronic white matter disease. Ventricular size is stable from prior exam. No definite acute cortical infarction. No mass lesion is noted on this unenhanced  scan. Vascular: Atherosclerotic calcifications of carotid siphon again noted. Skull: Negative for fracture or focal lesion. Sinuses/Orbits: There is mucosal thickening inferior aspect bilateral maxillary sinus. The mastoid air cells are unremarkable. Other: None IMPRESSION: No acute intracranial abnormality. Stable atrophy and chronic white matter disease. Atherosclerotic calcifications of carotid siphon. Electronically Signed   By: Lahoma Crocker M.D.   On: 08/05/2015 19:58   I have personally reviewed and evaluated these images and lab results as part of my medical decision-making.   EKG Interpretation   Date/Time:  Wednesday August 05 2015 17:36:21 EDT Ventricular Rate:  68 PR Interval:    QRS Duration: 98 QT Interval:  381 QTC Calculation: 405 R Axis:   -28 Text Interpretation:  Atrial fibrillation Left ventricular hypertrophy  Inferior infarct, old Confirmed by Evynn Boutelle MD, Koah Chisenhall ([id number]) on 08/05/2015  8:23:03 PM      MDM  PT D/W DR. A. HAMAD WHO WILL ADMIT PT FOR OBS.  Final diagnoses:  UTI (lower urinary tract infection)  Acute delirium      Isla Pence, MD 08/05/15 2208

## 2015-08-05 NOTE — Progress Notes (Signed)
Pharmacy Antibiotic Note  [name] is a 82 y.o. male admitted on 08/05/2015 with sepsis/UTI.   Pharmacy has been consulted for vancomycin and primaxin dosing.  Plan: Vancomycin 1gm IV q12h primaxin '500mg'$  IV q6h (1 dose given in ED) Follow renal function, cultures, clinical course vanc trough at steady state as needed     Temp (24hrs), Avg:98.5 F (36.9 C), Min:98.5 F (36.9 C), Max:98.5 F (36.9 C)   Recent Labs Lab 08/05/15 1742 08/05/15 1753  WBC 14.7*  --   CREATININE 0.81  --   LATICACIDVEN  --  0.83    Estimated Creatinine Clearance: 79.5 mL/min (by C-G formula based on Cr of 0.81).    Allergies  Allergen Reactions  . Sulfa Antibiotics Nausea And Vomiting  . Sulfonamide Derivatives Nausea And Vomiting    Antimicrobials this admission: 6/14 vancomycin 6/14 primaxin  Microbiology results: 6/14 BCx: sent 6/14 UCx: sent   Thank you for allowing pharmacy to be a part of this patient's care.  Dolly Rias RPh 08/05/2015, 8:52 PM Pager [phone]

## 2015-08-05 NOTE — ED Notes (Signed)
Pt transported to CT ?

## 2015-08-05 NOTE — Progress Notes (Signed)
Pt is active with [facility] as confirmed by Abby from [facility]  ED evaluation pending

## 2015-08-05 NOTE — Progress Notes (Signed)
PHARMACIST - PHYSICIAN ORDER COMMUNICATION  CONCERNING: P&T Medication Policy on Herbal Medications  DESCRIPTION:  This patient's order for:  Melatonin  has been noted.  This product(s) is classified as an "herbal" or natural product. Due to a lack of definitive safety studies or FDA approval, nonstandard manufacturing practices, plus the potential risk of unknown drug-drug interactions while on inpatient medications, the Pharmacy and Therapeutics Committee does not permit the use of "herbal" or natural products of this type within [facility].   ACTION TAKEN: The pharmacy department is unable to verify this order at this time and your patient has been informed of this safety policy. Please reevaluate patient's clinical condition at discharge and address if the herbal or natural product(s) should be resumed at that time.  Dia Sitter, PharmD, BCPS 08/05/2015 9:17 PM

## 2015-08-05 NOTE — H&P (Signed)
History and Physical  [name] GUY:[id number] DOB: 11-May-1930 DOA: 08/05/2015  PCP:  [name], MD   Chief Complaint:  Confusion   History of Present Illness:  Patient is a 82 yo male with history of invasive bladder cancer s/p radical cystoprostatectomy and ileal neobladder with postop chemo, with history of recurrent UTIs s/p chronic foley catheter for 1 year who was brought to ED with cc of increasing confusion. History was taken from daughter. She said patient was discharged from here earlier this month with diagnosis of UTI on IV cefepime at home. He has not had clinical improvement after discharge but actually he started getting confused and getting chills. He pulled his catheter yesterday and they had to bring him to the ED. Today he was so confused that she decided to bring him in. He has fever and chills. He has no complaints when I talked to him. Both denied any N/V/D/C/abd pain/dysuria/chest pain/dyspnea/cough. Urine has been looking more cloudy and smelly.   Review of Systems:  CONSTITUTIONAL:     No night sweats.  No fatigue.  +fever.+ +chills. Eyes:                            No visual changes.  No eye pain.  No eye discharge.   ENT:                              No epistaxis.  No sinus pain.  No sore throat.   No congestion. RESPIRATORY:           No cough.  No wheeze.  No hemoptysis.  No dyspnea CARDIOVASCULAR   :  No chest pains.  No palpitations. GASTROINTESTINAL:  No abdominal pain.  No nausea. No vomiting.  No diarrhea. No constipation.  No hematemesis.  No hematochezia.  No melena. GENITOURINARY:      No urgency.  No frequency.  No dysuria.  No hematuria.  +obstructive symptoms.  No discharge.  No pain.   MUSCULOSKELETAL:  No musculoskeletal pain.  No joint swelling.  No arthritis. NEUROLOGICAL:        +confusion.  No weakness. No headache. No seizure. PSYCHIATRIC:             No depression. No anxiety. No suicidal ideation. SKIN:                              No rashes.  No lesions.  No wounds. ENDOCRINE:                No weight loss.  No polydipsia.  No polyuria.  No polyphagia. HEMATOLOGIC:           No purpura.  No petechiae.  No bleeding.  ALLERGIC                 : No pruritus.  No angioedema Other:  Past Medical and Surgical History:   Past Medical History  Diagnosis Date  . Atrial fibrillation ([place])   . Hyperlipidemia   . Hypertension   . UTI (urinary tract infection)   . Dementia   . Bladder cancer ([facility])     hx of surgery and chemo  . Lung cancer [facility])     Status post surgery resection  . Diverticulitis   . Depression 08/19/2014  . CVA (cerebral infarction)    Past  Surgical History  Procedure Laterality Date  . Hernia repair  2006  . Cataract extraction, bilateral  1998, 2000  . Cystectomy  2002  . Lung removal, partial      Social History:   reports that he quit smoking about 23 years ago. His smoking use included Cigarettes. He has never used smokeless tobacco. He reports that he does not drink alcohol or use illicit drugs.    Allergies  Allergen Reactions  . Sulfa Antibiotics Nausea And Vomiting  . Sulfonamide Derivatives Nausea And Vomiting    Family History  Problem Relation Age of Onset  . Cancer Maternal Aunt     unknown  . Cancer Maternal Grandmother     unknown  . Aneurysm Brother   . Stroke Father     cerebral hemorrhage      Prior to Admission medications   Medication Sig Start Date End Date Taking? Authorizing Provider  ALPHA LIPOIC ACID PO Take 200 mg by mouth every morning.    Yes Historical Provider, MD  ascorbic acid (VITAMIN C) 1000 MG tablet Take 1,000 mg by mouth every morning.    Yes Historical Provider, MD  aspirin EC 81 MG tablet Take 81 mg by mouth daily with breakfast.   Yes Historical Provider, MD  atorvastatin (LIPITOR) 10 MG tablet Take 10 mg by mouth at bedtime.    Yes Historical Provider, MD  buPROPion (WELLBUTRIN) 75 MG tablet Take 75 mg by mouth daily with  breakfast.    Yes Historical Provider, MD  calcium carbonate (OS-CAL - DOSED IN MG OF ELEMENTAL CALCIUM) 1250 MG tablet Take 1 tablet by mouth daily with breakfast.    Yes Historical Provider, MD  Cholecalciferol (EQL VITAMIN D3) 1000 units tablet Take 1,000 Units by mouth every morning.   Yes Historical Provider, MD  Cyanocobalamin (VITAMIN B 12 PO) Take 50 mcg by mouth every morning.    Yes Historical Provider, MD  MELATONIN PO Take 3 mg by mouth at bedtime as needed (sleep).    Yes Historical Provider, MD  methenamine (HIPREX) 1 G tablet Take 1 g by mouth 2 (two) times daily.    Yes Historical Provider, MD  metoprolol succinate (TOPROL-XL) 25 MG 24 hr tablet Take 25 mg by mouth at bedtime.  07/06/15  Yes Historical Provider, MD  nitrofurantoin (MACRODANTIN) 100 MG capsule Resume 6/13 after antibiotics are done. Patient taking differently: Take 100 mg by mouth daily.  07/24/15  Yes Thurnell Lose, MD  saccharomyces boulardii (FLORASTOR) 250 MG capsule Take 1 capsule (250 mg total) by mouth 2 (two) times daily. Patient taking differently: Take 250 mg by mouth daily.  06/02/15  Yes Eugenie Filler, MD  sertraline (ZOLOFT) 100 MG tablet Take 100 mg by mouth daily with breakfast.    Yes Historical Provider, MD  amLODipine (NORVASC) 10 MG tablet Take 1 tablet (10 mg total) by mouth daily. Patient not taking: Reported on 08/04/2015 07/24/15   Thurnell Lose, MD  ceFEPIme 2 g in dextrose 5 % 50 mL Inject 2 g into the vein every 12 (twelve) hours. Stop date 08/03/2015 Patient not taking: Reported on 08/05/2015 07/24/15   Thurnell Lose, MD  ciprofloxacin (CIPRO) 500 MG tablet Take 500 mg by mouth every 12 (twelve) hours.  08/05/15   Historical Provider, MD  hydrocortisone valerate cream (WESTCORT) 0.2 % Apply 1 application topically 2 (two) times daily as needed (itching).     Historical Provider, MD  ibuprofen (ADVIL,MOTRIN) 400 MG tablet Take  400 mg by mouth every 6 (six) hours as needed for moderate  pain.    Historical Provider, MD    Physical Exam: BP 142/89 mmHg  Pulse 78  Temp(Src) 98.5 F (36.9 C) (Oral)  Resp 26  SpO2 94%  GENERAL :   Alert and cooperative, and appears to be in no acute distress. HEAD:           normocephalic. EYES:            PERRL, EOMI.  vision is grossly intact. EARS:           hearing grossly intact. NOSE:           No nasal discharge. THROAT:     Oral cavity and pharynx ; dry NECK:          supple CARDIAC:    Normal S1 and S2. No gallop. No murmurs.  Vascular:     no peripheral edema. Extremities are warm and well perfused. No carotid bruits. LUNGS:       Clear to auscultation  ABDOMEN: Positive bowel sounds. Soft, nondistended, nontender. No guarding or rebound.      MSK:           No joint erythema or tenderness.  EXT           : No significant deformity or joint abnormality. Neuro        : Alert, oriented to person, place, and time. SKIN:            No rash. No lesions.           Labs on Admission:  Reviewed.   Radiological Exams on Admission: Dg Chest 2 View  08/05/2015  CLINICAL DATA:  Mental status changes EXAM: CHEST  2 VIEW COMPARISON:  07/23/2015 FINDINGS: Cardiomegaly is noted. Study is limited by poor inspiration. Mild basilar atelectasis. Central mild vascular congestion without pulmonary edema. Stable right arm PICC line position. Mild degenerative changes bilateral shoulders. Atherosclerotic calcifications of thoracic aorta. Mitral valve calcifications. IMPRESSION: Right arm PICC line with tip in SVC right atrium junction. Cardiomegaly. Poor inspiration. Mild basilar atelectasis. No infiltrate or pulmonary edema. Electronically Signed   By: Lahoma Crocker M.D.   On: 08/05/2015 18:20   Ct Head Wo Contrast  08/05/2015  CLINICAL DATA:  Mental status changes, history of dementia EXAM: CT HEAD WITHOUT CONTRAST TECHNIQUE: Contiguous axial images were obtained from the base of the skull through the vertex without intravenous contrast.  COMPARISON:  07/22/2015 FINDINGS: Brain: No intracranial hemorrhage, mass effect or midline shift. Stable atrophy and chronic white matter disease. Ventricular size is stable from prior exam. No definite acute cortical infarction. No mass lesion is noted on this unenhanced scan. Vascular: Atherosclerotic calcifications of carotid siphon again noted. Skull: Negative for fracture or focal lesion. Sinuses/Orbits: There is mucosal thickening inferior aspect bilateral maxillary sinus. The mastoid air cells are unremarkable. Other: None IMPRESSION: No acute intracranial abnormality. Stable atrophy and chronic white matter disease. Atherosclerotic calcifications of carotid siphon. Electronically Signed   By: Lahoma Crocker M.D.   On: 08/05/2015 19:58    EKG:  Independently reviewed. Afib  Assessment/Plan  UTI: H/o recurrent UTIs with indwelling cath due to neobladder. Ucx sent Will start on vanc/imipenem pending culture results.  Consult to ID in am. Hemodynamically stable  Confusion: likely due to above. He was alert, O#3 when I saw him.   Dementia: cont home meds  Afib:  CHADS2 score: 3 (history of CVA in chart): continue  aspirin and BB.  H/o bladder cancer s/p surgical removal and neobladder : f/u with Urology. Consult in am for management after discharge.   Input & Output: NA Lines & Tubes: PIV, Foley DVT prophylaxis:   enoxaparin  GI prophylaxis: NA Consultants: ID, Urology  Family Communication: Daughter at bedside.   Disposition Plan: Obs.     Gennaro Africa M.D [facility]

## 2015-08-05 NOTE — ED Notes (Signed)
Patient transported to X-ray 

## 2015-08-05 NOTE — ED Notes (Addendum)
Per EMS, pt from home with hx dementia. Family called EMS because patient has been more altered today. Pt had a new catheter placed yesterday after pulling it out. Hx chronic UTI.

## 2015-08-06 DIAGNOSIS — N390 Urinary tract infection, site not specified: Secondary | ICD-10-CM | POA: Diagnosis not present

## 2015-08-06 LAB — CBC
HCT: 42.4 % (ref 39.0–52.0)
HEMOGLOBIN: 13.2 g/dL (ref 13.0–17.0)
MCH: 28.5 pg (ref 26.0–34.0)
MCHC: 31.1 g/dL (ref 30.0–36.0)
MCV: 91.6 fL (ref 78.0–100.0)
PLATELETS: 173 10*3/uL (ref 150–400)
RBC: 4.63 MIL/uL (ref 4.22–5.81)
RDW: 15.3 % (ref 11.5–15.5)
WBC: 12.9 10*3/uL — AB (ref 4.0–10.5)

## 2015-08-06 LAB — BASIC METABOLIC PANEL
ANION GAP: 5 (ref 5–15)
BUN: 18 mg/dL (ref 6–20)
CHLORIDE: 107 mmol/L (ref 101–111)
CO2: 26 mmol/L (ref 22–32)
Calcium: 8.7 mg/dL — ABNORMAL LOW (ref 8.9–10.3)
Creatinine, Ser: 0.79 mg/dL (ref 0.61–1.24)
GFR calc Af Amer: >60 mL/min (ref >60)
GFR calc non Af Amer: >60 mL/min (ref >60)
GLUCOSE: 120 mg/dL — AB (ref 65–99)
POTASSIUM: 3.7 mmol/L (ref 3.5–5.1)
SODIUM: 138 mmol/L (ref 135–145)

## 2015-08-06 LAB — URINE CULTURE: CULTURE: NO GROWTH

## 2015-08-06 MED ORDER — RESOURCE THICKENUP CLEAR PO POWD
ORAL | Status: DC | PRN
  Filled 2015-08-06: qty 125

## 2015-08-06 NOTE — Progress Notes (Signed)
Pt live with family and plan to discharge home with daughter. Pt is active with [facility] and [facility] 24/7.

## 2015-08-06 NOTE — Progress Notes (Signed)
[facility] Progress Note  Patient: [name] [id number]   PCP: [name], MD DOB: January 11, 1931   DOA: 08/05/2015   DOS: 08/06/2015   Date of Service: the patient was seen and examined on 08/06/2015  Subjective: As per the daughter the patient has been more lethargic. Patient has not been able to swallow. At his baseline patient ambulates with a walker. No fall or trauma or injury reported. Nutrition: Minimal oral intake  Brief hospital course: Pt. with PMH of bladder cancer status post medial neobladder with recurrent UTI; admitted on 08/05/2015, with complaint of confusion, was found to have recurrent UTI. Currently further plan is continue broad-spectrum antibiotics  Assessment and Plan: 1 UTI Last admission patient was discharged on cefepime but at present appears to have increasing confusion on the same and appears to have recurrent UTI. Currently patient is on Primaxin and vancomycin and clinically improving. We will continue the same.  2. acute metabolic encephalopathy Clinical improvement noted when I assessed the patient. Secondary to problem #1.  Place on IV vancomycin and Primaxin. IV fluids. Supportive care. Speech therapy consult recommended dysphagia type II diet.  3 atrial fibrillation Currently rate controlled. Continue metoprolol for rate control. Hold aspirin. Not on any anticoagulation due to high risk of bleeding as well as recurrent fall.  4 hypertension Stable. Continue Toprol-XL.  5. dementia Continue home medications.  6. depression Continue Zoloft and Wellbutrin.  7. hyperlipidemia Continue Lipitor.  Pain management: When necessary Tylenol Activity: Consulted physical therapy Bowel regimen: last BM prior to admission Diet: Dysphagia type II diet DVT Prophylaxis: subcutaneous Heparin  Advance goals of care discussion: Full code  Family Communication: family was present at bedside, at the time of interview. The pt provided  permission to discuss medical plan with the family. Opportunity was given to ask question and all questions were answered satisfactorily.   Disposition:  Discharge to home versus SNF. Expected discharge date: 08/08/2015, pending blood cultures and urine cultures  Consultants: Phone consultation with urology Procedures: none  Antibiotics: Anti-infectives    Start     Dose/Rate Route Frequency Ordered Stop   08/06/15 0200  imipenem-cilastatin (PRIMAXIN) 500 mg in sodium chloride 0.9 % 100 mL IVPB     500 mg 200 mL/hr over 30 Minutes Intravenous Every 6 hours 08/05/15 2054     08/05/15 2200  vancomycin (VANCOCIN) IVPB 1000 mg/200 mL premix     1,000 mg 200 mL/hr over 60 Minutes Intravenous Every 12 hours 08/05/15 2142     08/05/15 2130  vancomycin (VANCOCIN) IVPB 1000 mg/200 mL premix  Status:  Discontinued     1,000 mg 200 mL/hr over 60 Minutes Intravenous Every 12 hours 08/05/15 2054 08/05/15 2142   08/05/15 1930  imipenem-cilastatin (PRIMAXIN) 500 mg in sodium chloride 0.9 % 100 mL IVPB     500 mg 200 mL/hr over 30 Minutes Intravenous  Once 08/05/15 1925 08/05/15 2038        Intake/Output Summary (Last 24 hours) at 08/06/15 1954 Last data filed at 08/06/15 1800  Gross per 24 hour  Intake 2727.92 ml  Output   2025 ml  Net 702.92 ml   Filed Weights   08/05/15 2143  Weight: 85.9 kg (189 lb 6 oz)    Objective: Physical Exam: Filed Vitals:   08/05/15 2030 08/05/15 2143 08/06/15 0429 08/06/15 1500  BP: 152/102 159/98 139/75 129/78  Pulse: 95 85 85 70  Temp:  98.7 F (37.1 C) 98.5 F (36.9 C) 98.7 F (37.1 C)  TempSrc:  Oral Oral Oral  Resp: '20 20 22 20  '$ Height:  '6\' 1"'$  (1.854 m)    Weight:  85.9 kg (189 lb 6 oz)    SpO2: 94% 98% 98% 99%    General: Alert, Awake and Oriented to Person. Appear in mild distress Eyes: PERRL, Conjunctiva normal ENT: Oral Mucosa clear dry. Neck: difficult to assess JVD, no Abnormal Mass Or lumps Cardiovascular: S1 and S2 Present, no  Murmur, Respiratory: Bilateral Air entry equal and Decreased, Clear to Auscultation, no Crackles, no wheezes Abdomen: Bowel Sound present, Soft and no tenderness Skin: no redness, no Rash  Extremities: trace Pedal edema, no calf tenderness Neurologic: Grossly no focal neuro deficit. Bilaterally Equal motor strength Data Reviewed: CBC:  Recent Labs Lab 08/05/15 1742 08/06/15 0329  WBC 14.7* 12.9*  NEUTROABS 12.9*  --   HGB 14.2 13.2  HCT 43.8 42.4  MCV 90.3 91.6  PLT 182 147   Basic Metabolic Panel:  Recent Labs Lab 08/05/15 1742 08/06/15 0329  NA 141 138  K 3.8 3.7  CL 107 107  CO2 27 26  GLUCOSE 114* 120*  BUN 20 18  CREATININE 0.81 0.79  CALCIUM 9.9 8.7*    Liver Function Tests:  Recent Labs Lab 08/05/15 1742  AST 23  ALT 38  ALKPHOS 117  BILITOT 1.2  PROT 7.2  ALBUMIN 4.2   No results for input(s): LIPASE, AMYLASE in the last 168 hours. No results for input(s): AMMONIA in the last 168 hours. Coagulation Profile: No results for input(s): INR, PROTIME in the last 168 hours. Cardiac Enzymes: No results for input(s): CKTOTAL, CKMB, CKMBINDEX, TROPONINI in the last 168 hours. BNP (last 3 results) No results for input(s): PROBNP in the last 8760 hours.  CBG: No results for input(s): GLUCAP in the last 168 hours.  Studies: No results found.   Scheduled Meds: . aspirin EC  81 mg Oral Q breakfast  . atorvastatin  10 mg Oral QHS  . buPROPion  75 mg Oral Q breakfast  . calcium carbonate  1 tablet Oral Q breakfast  . cholecalciferol  1,000 Units Oral Daily  . enoxaparin (LOVENOX) injection  40 mg Subcutaneous Q24H  . imipenem-cilastatin  500 mg Intravenous Q6H  . methenamine  1,000 mg Oral BID  . sertraline  100 mg Oral Q breakfast  . vancomycin  1,000 mg Intravenous Q12H   Continuous Infusions: . sodium chloride 75 mL/hr at 08/06/15 0715   PRN Meds: [place]  Time spent: 30 minutes  Author: Berle Mull, MD [facility] Pager: [phone] 08/06/2015 7:54 PM  If 7PM-7AM, please contact night-coverage at [email or web address], password [facility]

## 2015-08-06 NOTE — Evaluation (Addendum)
Clinical/Bedside Swallow Evaluation Patient Details  Name: [name] MRN: [id number] Date of Birth: [birth year]  Today's Date: 08/06/2015 Time: SLP Start Time (ACUTE ONLY): 1215 SLP Stop Time (ACUTE ONLY): 1255 SLP Time Calculation (min) (ACUTE ONLY): 40 min  Past Medical History:  Past Medical History  Diagnosis Date  . Atrial fibrillation ([place])   . Hyperlipidemia   . Hypertension   . UTI (urinary tract infection)   . Dementia   . Bladder cancer ([facility])     hx of surgery and chemo  . Lung cancer [facility])     Status post surgery resection  . Diverticulitis   . Depression 08/19/2014  . CVA (cerebral infarction)    Past Surgical History:  Past Surgical History  Procedure Laterality Date  . Hernia repair  2006  . Cataract extraction, bilateral  1998, 2000  . Cystectomy  2002  . Lung removal, partial     HPI:  Patient is a 82 yo male with history of invasive bladder cancer s/p radical cystoprostatectomy and ileal neobladder with postop chemo, with history of recurrent UTIs s/p chronic foley catheter for 1 year who was brought to ED with cc of increasing confusion. History was taken from daughter. Pt was discharged from here earlier this month with diagnosis of UTI on IV cefepime at home. He was admitted with fever and chills. He has no complaints when I talked to him. Both denied any N/V/D/C/abd pain/dysuria/chest pain/dyspnea/cough.  Today pt was demonstrating difficullties swallowin and evaluation was ordered.    Assessment / Plan / Recommendation Clinical Impression  PT with suspected cognitive based dysphagia c/b delayed oral transiting and oral holding.   Suspect premature spillage of boluses into pharynx = poorly controlled with probable aspiration of thin via straw.  Tsp of thins and straw boluses of nectar tolerated well.  Mutliple, audible swallow noted across all consistencies.  Decreased oral transiting with solids resulting in solid food residuals without pt awareness -  use of puree helpful to clear.  Pt does orally hold even liquids but self feeding helped elicit reflexive swallowing.   Skilled intervention included determining effective compensation strategies.    Recommend to modify diet to maximize swallow safety with moderate assistance, anticipate swallow to improve with improved mental status.     SLP to follow for education, tolerance and readines for dietary advancement.       Aspiration Risk  Mild aspiration risk    Diet Recommendation Dysphagia 2 (Fine chop);Nectar-thick liquid;Other (Comment) (tsps thin)   Liquid Administration via: Straw (tsps thin ok) Medication Administration: Whole meds with puree Supervision: Staff to assist with self feeding (help pt self feed) Compensations: Minimize environmental distractions;Slow rate;Small sips/bites;Lingual sweep for clearance of pocketing;Other (Comment) (use puree to aid oral transiting ) Postural Changes: Seated upright at 90 degrees;Remain upright for at least 30 minutes after po intake    Other  Recommendations Oral Care Recommendations: Oral care BID Other Recommendations: Have oral suction available (slp left in room)   Follow up Recommendations  None    Frequency and Duration min 2x/week  2 weeks       Prognosis Prognosis for Safe Diet Advancement: Good      Swallow Study   General Date of Onset: 08/06/15 HPI: Patient is a 82 yo male with history of invasive bladder cancer s/p radical cystoprostatectomy and ileal neobladder with postop chemo, with history of recurrent UTIs s/p chronic foley catheter for 1 year who was brought to ED with cc of increasing confusion.  History was taken from daughter. Pt was discharged from here earlier this month with diagnosis of UTI on IV cefepime at home. He was admitted with fever and chills. He has no complaints when I talked to him. Both denied any N/V/D/C/abd pain/dysuria/chest pain/dyspnea/cough.  Today pt was demonstrating difficullties swallowin  and evaluation was ordered.  Type of Study: Bedside Swallow Evaluation Diet Prior to this Study: Regular;Thin liquids Temperature Spikes Noted: No Respiratory Status: Nasal cannula History of Recent Intubation: No Behavior/Cognition: Alert;Cooperative Oral Cavity Assessment: Within Functional Limits Oral Care Completed by SLP: No Oral Cavity - Dentition: Adequate natural dentition Vision: Functional for self-feeding Self-Feeding Abilities: Able to feed self Patient Positioning: Upright in bed Baseline Vocal Quality: Normal Volitional Cough: Cognitively unable to elicit Volitional Swallow: Unable to elicit    Oral/Motor/Sensory Function Overall Oral Motor/Sensory Function: Generalized oral weakness (? mild right lingual deviation upon protrusion and pt leaning right)   Ice Chips   DNT  Thin Liquid Thin Liquid: Impaired Presentation: Cup;Self Fed;Straw;Spoon Oral Phase Impairments: Poor awareness of bolus;Reduced labial seal Oral Phase Functional Implications: Oral holding;Prolonged oral transit Pharyngeal  Phase Impairments: Suspected delayed Swallow;Cough - Immediate;Multiple swallows    Nectar Thick Nectar Thick Liquid: Impaired Presentation: Straw Oral Phase Impairments: Reduced labial seal Oral phase functional implications: Prolonged oral transit Pharyngeal Phase Impairments: Suspected delayed Swallow;Multiple swallows   Honey Thick Honey Thick Liquid: Not tested   Puree Puree: Impaired Presentation: Self Fed;Spoon Oral Phase Impairments: Reduced labial seal;Reduced lingual movement/coordination Oral Phase Functional Implications: Prolonged oral transit Pharyngeal Phase Impairments: Suspected delayed Swallow   Solid   GO   Solid: Impaired Oral Phase Impairments: Reduced lingual movement/coordination;Poor awareness of bolus;Impaired mastication Oral Phase Functional Implications: Oral residue Pharyngeal Phase Impairments: Suspected delayed Swallow Other Comments: use of  puree faciliated transiting of [place] oral residuals    Functional Assessment Tool Used: clinical judgement Functional Limitations: Swallowing Swallow Current Status (O1308): At least 20 percent but less than 40 percent impaired, limited or restricted Swallow Goal Status [phone]): At least 1 percent but less than 20 percent impaired, limited or restricted   Luanna Salk, [facility] SLP [phone]

## 2015-08-06 NOTE — Progress Notes (Signed)
CMT reported 2.67 pause, vitals stable, pt asymptomatic. On-call paged, will carry out any new orders and continue to monitor.

## 2015-08-07 DIAGNOSIS — Z7982 Long term (current) use of aspirin: Secondary | ICD-10-CM | POA: Diagnosis not present

## 2015-08-07 DIAGNOSIS — N390 Urinary tract infection, site not specified: Secondary | ICD-10-CM | POA: Insufficient documentation

## 2015-08-07 DIAGNOSIS — F329 Major depressive disorder, single episode, unspecified: Secondary | ICD-10-CM | POA: Diagnosis not present

## 2015-08-07 DIAGNOSIS — E785 Hyperlipidemia, unspecified: Secondary | ICD-10-CM | POA: Diagnosis present

## 2015-08-07 DIAGNOSIS — Z8551 Personal history of malignant neoplasm of bladder: Secondary | ICD-10-CM | POA: Diagnosis not present

## 2015-08-07 DIAGNOSIS — I4891 Unspecified atrial fibrillation: Secondary | ICD-10-CM | POA: Diagnosis present

## 2015-08-07 DIAGNOSIS — I10 Essential (primary) hypertension: Secondary | ICD-10-CM | POA: Diagnosis present

## 2015-08-07 DIAGNOSIS — Z79899 Other long term (current) drug therapy: Secondary | ICD-10-CM | POA: Diagnosis not present

## 2015-08-07 DIAGNOSIS — M6281 Muscle weakness (generalized): Secondary | ICD-10-CM | POA: Diagnosis not present

## 2015-08-07 DIAGNOSIS — T361X5A Adverse effect of cephalosporins and other beta-lactam antibiotics, initial encounter: Secondary | ICD-10-CM | POA: Diagnosis present

## 2015-08-07 DIAGNOSIS — R410 Disorientation, unspecified: Secondary | ICD-10-CM | POA: Diagnosis not present

## 2015-08-07 DIAGNOSIS — Z87891 Personal history of nicotine dependence: Secondary | ICD-10-CM | POA: Diagnosis not present

## 2015-08-07 DIAGNOSIS — R278 Other lack of coordination: Secondary | ICD-10-CM | POA: Diagnosis not present

## 2015-08-07 DIAGNOSIS — R1310 Dysphagia, unspecified: Secondary | ICD-10-CM | POA: Diagnosis not present

## 2015-08-07 DIAGNOSIS — R41841 Cognitive communication deficit: Secondary | ICD-10-CM | POA: Diagnosis not present

## 2015-08-07 DIAGNOSIS — Z8673 Personal history of transient ischemic attack (TIA), and cerebral infarction without residual deficits: Secondary | ICD-10-CM | POA: Diagnosis not present

## 2015-08-07 DIAGNOSIS — I482 Chronic atrial fibrillation: Secondary | ICD-10-CM | POA: Diagnosis not present

## 2015-08-07 DIAGNOSIS — K219 Gastro-esophageal reflux disease without esophagitis: Secondary | ICD-10-CM | POA: Diagnosis not present

## 2015-08-07 DIAGNOSIS — C679 Malignant neoplasm of bladder, unspecified: Secondary | ICD-10-CM | POA: Diagnosis not present

## 2015-08-07 DIAGNOSIS — R4182 Altered mental status, unspecified: Secondary | ICD-10-CM | POA: Diagnosis not present

## 2015-08-07 DIAGNOSIS — F0390 Unspecified dementia without behavioral disturbance: Secondary | ICD-10-CM | POA: Diagnosis present

## 2015-08-07 DIAGNOSIS — G92 Toxic encephalopathy: Secondary | ICD-10-CM | POA: Diagnosis present

## 2015-08-07 DIAGNOSIS — G9341 Metabolic encephalopathy: Secondary | ICD-10-CM | POA: Diagnosis not present

## 2015-08-07 LAB — CBC WITH DIFFERENTIAL/PLATELET
Basophils Absolute: 0 10*3/uL (ref 0.0–0.1)
Basophils Relative: 0 %
EOS PCT: 2 %
Eosinophils Absolute: 0.3 10*3/uL (ref 0.0–0.7)
HCT: 39.7 % (ref 39.0–52.0)
Hemoglobin: 13 g/dL (ref 13.0–17.0)
LYMPHS ABS: 0.8 10*3/uL (ref 0.7–4.0)
LYMPHS PCT: 5 %
MCH: 29 pg (ref 26.0–34.0)
MCHC: 32.7 g/dL (ref 30.0–36.0)
MCV: 88.6 fL (ref 78.0–100.0)
MONO ABS: 1.8 10*3/uL — AB (ref 0.1–1.0)
Monocytes Relative: 12 %
Neutro Abs: 12.1 10*3/uL — ABNORMAL HIGH (ref 1.7–7.7)
Neutrophils Relative %: 81 %
PLATELETS: 136 10*3/uL — AB (ref 150–400)
RBC: 4.48 MIL/uL (ref 4.22–5.81)
RDW: 15.1 % (ref 11.5–15.5)
WBC: 15.1 10*3/uL — ABNORMAL HIGH (ref 4.0–10.5)

## 2015-08-07 LAB — COMPREHENSIVE METABOLIC PANEL
ALT: 26 U/L (ref 17–63)
AST: 19 U/L (ref 15–41)
Albumin: 3.5 g/dL (ref 3.5–5.0)
Alkaline Phosphatase: 90 U/L (ref 38–126)
Anion gap: 7 (ref 5–15)
BUN: 17 mg/dL (ref 6–20)
CHLORIDE: 107 mmol/L (ref 101–111)
CO2: 26 mmol/L (ref 22–32)
CREATININE: 0.76 mg/dL (ref 0.61–1.24)
Calcium: 8.4 mg/dL — ABNORMAL LOW (ref 8.9–10.3)
Glucose, Bld: 108 mg/dL — ABNORMAL HIGH (ref 65–99)
POTASSIUM: 3.3 mmol/L — AB (ref 3.5–5.1)
Sodium: 140 mmol/L (ref 135–145)
TOTAL PROTEIN: 6.6 g/dL (ref 6.5–8.1)
Total Bilirubin: 1 mg/dL (ref 0.3–1.2)

## 2015-08-07 LAB — URINE CULTURE

## 2015-08-07 LAB — MAGNESIUM: MAGNESIUM: 1.9 mg/dL (ref 1.7–2.4)

## 2015-08-07 MED ORDER — SODIUM CHLORIDE 0.9% FLUSH
10.0000 mL | Freq: Two times a day (BID) | INTRAVENOUS | Status: DC
  Administered 2015-08-08: 10 mL

## 2015-08-07 MED ORDER — SODIUM CHLORIDE 0.9% FLUSH
10.0000 mL | INTRAVENOUS | Status: DC | PRN
  Administered 2015-08-10: 10 mL
  Filled 2015-08-07: qty 40

## 2015-08-07 MED ORDER — POTASSIUM CHLORIDE 10 MEQ/50ML IV SOLN
10.0000 meq | INTRAVENOUS | Status: AC
  Administered 2015-08-07 (×3): 10 meq via INTRAVENOUS
  Filled 2015-08-07 (×3): qty 50

## 2015-08-07 NOTE — Progress Notes (Signed)
Speech Language Pathology Treatment: Dysphagia  Patient Details Name: MACARIO SHEAR MRN: [id number] DOB: 12/09/1930 Today's Date: 08/07/2015 Time: 1140-1200 SLP Time Calculation (min) (ACUTE ONLY): 20 min  Assessment / Plan / Recommendation Clinical Impression  Pt seen for follow up after BSE completed 08/06/15. Pt was resting in bed, lunch tray at bedside. Pt required encouragement to accept a few bites of his lunch. No overt s/s aspiration observed with any consistency. Recommend continuing with current diet (dys 2, nectar thick liquids). ST will continue to follow for readiness to advance.   HPI HPI: Patient is a 82 yo male with history of invasive bladder cancer s/p radical cystoprostatectomy and ileal neobladder with postop chemo, with history of recurrent UTIs s/p chronic foley catheter for 1 year who was brought to ED with cc of increasing confusion. History was taken from daughter. Pt was discharged from here earlier this month with diagnosis of UTI on IV cefepime at home. He was admitted with fever and chills. Both denied any N/V/D/C/abd pain/dysuria/chest pain/dyspnea/cough. Pt was demonstrating difficullties swallowing and evaluation was ordered.  BSE completed 08/06/15 with recommendation for dys 2/nectar thick liquids      SLP Plan  Continue with current plan of care     Recommendations  Diet recommendations: Dysphagia 2 (fine chop);Nectar-thick liquid Liquids provided via: Cup;Straw Medication Administration: Whole meds with puree Supervision: Full supervision/cueing for compensatory strategies;Staff to assist with self feeding Compensations: Minimize environmental distractions;Slow rate;Small sips/bites;Lingual sweep for clearance of pocketing;Other (Comment) (use puree to aid oral clearing) Postural Changes and/or Swallow Maneuvers: Seated upright 90 degrees;Upright 30-60 min after meal             Oral Care Recommendations: Oral care BID Follow up Recommendations:  None Plan: Continue with current plan of care           Shonna Chock 08/07/2015, 2:07 PM  Guilianna Mckoy [facility], [place] [phone]

## 2015-08-07 NOTE — Progress Notes (Addendum)
Pharmacy Antibiotic Note  [name] is a 82 y.o. male admitted on 08/05/2015 with sepsis/UTI.   Pharmacy has been consulted for primaxin dosing, vancomycin stopped in light of culture results.   Patient on cefepime prior to admission - completing 14-days therapy from 6/2.  ? If yeast in urine is colonization vs pathogen and if symptoms persisting due to inadequate   Today, 08/07/2015  Day #2 antibiotics - WBC up today - SCr WNL  Plan:  Continue primaxin '500mg'$  IV q6h  Suggest discuss case with ID, may need to consider change to ertapenem 1gm IV q24h at discharge based on recent UCx results with MDR-e. Coli.    Height: '6\' 1"'$  (185.4 cm) Weight: 189 lb 6 oz (85.9 kg) IBW/kg (Calculated) : 79.9  Temp (24hrs), Avg:98.7 F (37.1 C), Min:98.5 F (36.9 C), Max:99 F (37.2 C)   Recent Labs Lab 08/05/15 1742 08/05/15 1753 08/06/15 0329 08/07/15 0530  WBC 14.7*  --  12.9* 15.1*  CREATININE 0.81  --  0.79 0.76  LATICACIDVEN  --  0.83  --   --     Estimated Creatinine Clearance: 80.5 mL/min (by C-G formula based on Cr of 0.76).    Allergies  Allergen Reactions  . Sulfa Antibiotics Nausea And Vomiting  . Sulfonamide Derivatives Nausea And Vomiting    Antimicrobials this admission: 6/14 vancomycin >> 6/16 6/14 primaxin  Microbiology results: 5/31 UCx: >100K EColi (R-amp, unasyn, ancef, cipro, nitrofurantoin, bactrim, I-CTX, S-gent,primaxin, zosyn) 6/14 BCx: NGTD 6/14 UCx: 40K yeast  Thank you for allowing pharmacy to be a part of this patient's care.  Doreene Eland, PharmD, BCPS.   Pager: [phone] 08/07/2015 1:50 PM

## 2015-08-07 NOTE — Care Management Obs Status (Signed)
[place] NOTIFICATION   Patient Details  Name: [name] MRN: [id number] Date of Birth: [birth year]   Medicare Observation Status Notification Given:  Yes    Purcell Mouton, RN 08/07/2015, 11:55 AM

## 2015-08-07 NOTE — Progress Notes (Signed)
[facility]  Patient Status:   Active patient with [facility] and [facility] prior to this admission  [facility] is providing the following services: [facility] is providing [facility] care services.  [facility] is partnering to provide IV ABX in the home for the patient.  [facility] hospital team will follow [name] while he is here and support transition home when ordered. [facility] pharmacy team and [facility] aware of readmission status.   If patient discharges after hours, please call [phone].   [name] 08/07/2015, 7:06 AM

## 2015-08-07 NOTE — Progress Notes (Signed)
[facility] Progress Note  Patient: [name] ACZ:[id number]   PCP: [name], MD DOB: 02/14/1931   DOA: 08/05/2015   DOS: 08/07/2015   Date of Service: the patient was seen and examined on 08/07/2015  Subjective: The patient is more awake this morning. Denies having any acute complaint. No nausea no vomiting. No burning urination. No diarrhea no constipation. Nutrition: Minimal oral intake  Brief hospital course: Pt. with PMH of bladder cancer status post medial neobladder with recurrent UTI; admitted on 08/05/2015, with complaint of confusion, was found to have recurrent UTI. Currently further plan is continue broad-spectrum antibiotics  Assessment and Plan: 1 UTI Last admission patient was discharged on cefepime but at present appears to have increasing confusion on the same and appears to have recurrent UTI. Currently patient is on Primaxin and vancomycin and clinically improving. Blood cultures no growth after 24 hours, urine culture grows [place] without any bacteria. I discussed with infectious disease will. The patient's encephalopathy can also be secondary to cefepime and therefore recommend to complete a course with Primaxin for 7 days treatment. We will discontinue vancomycin. Await 48 hours culture clearance.  2. acute metabolic encephalopathy Clinical improvement noted when I assessed the patient. Secondary to problem #1.  Place on IV vancomycin and Primaxin. IV fluids.  Stop IV fluids, continue Supportive care. Speech therapy consult recommended dysphagia type II diet.  3 atrial fibrillation Currently rate controlled. Continue metoprolol for rate control. Hold aspirin. Not on any anticoagulation due to high risk of bleeding as well as recurrent fall.  4 hypertension Stable. Continue Toprol-XL.  5. dementia Continue home medications.  6. depression Continue Zoloft and Wellbutrin.  7. hyperlipidemia Continue Lipitor.  Pain management: When necessary  Tylenol Activity: Consulted physical therapy Bowel regimen: last BM prior to admission Diet: Dysphagia type II diet DVT Prophylaxis: subcutaneous Heparin  Advance goals of care discussion: Full code  Family Communication: no family was present at bedside, at the time of interview.  Disposition:  Discharge to home versus SNF. Expected discharge date: 08/08/2015, pending blood cultures and urine cultures  Consultants: Phone consultation with urology Procedures: none  Antibiotics: Anti-infectives    Start     Dose/Rate Route Frequency Ordered Stop   08/06/15 0200  imipenem-cilastatin (PRIMAXIN) 500 mg in sodium chloride 0.9 % 100 mL IVPB     500 mg 200 mL/hr over 30 Minutes Intravenous Every 6 hours 08/05/15 2054     08/05/15 2200  vancomycin (VANCOCIN) IVPB 1000 mg/200 mL premix  Status:  Discontinued     1,000 mg 200 mL/hr over 60 Minutes Intravenous Every 12 hours 08/05/15 2142 08/07/15 1126   08/05/15 2130  vancomycin (VANCOCIN) IVPB 1000 mg/200 mL premix  Status:  Discontinued     1,000 mg 200 mL/hr over 60 Minutes Intravenous Every 12 hours 08/05/15 2054 08/05/15 2142   08/05/15 1930  imipenem-cilastatin (PRIMAXIN) 500 mg in sodium chloride 0.9 % 100 mL IVPB     500 mg 200 mL/hr over 30 Minutes Intravenous  Once 08/05/15 1925 08/05/15 2038        Intake/Output Summary (Last 24 hours) at 08/07/15 1610 Last data filed at 08/07/15 0900  Gross per 24 hour  Intake 1922.5 ml  Output   1000 ml  Net  922.5 ml   Filed Weights   08/05/15 2143  Weight: 85.9 kg (189 lb 6 oz)    Objective: Physical Exam: Filed Vitals:   08/06/15 1500 08/06/15 2224 08/07/15 0558 08/07/15 1300  BP: 129/78  145/77 166/91 131/70  Pulse: 70 82 76 79  Temp: 98.7 F (37.1 C) 99 F (37.2 C) 98.5 F (36.9 C) 98.2 F (36.8 C)  TempSrc: Oral Oral Oral Oral  Resp: '20 18 20 18  '$ Height:      Weight:      SpO2: 99% 96% 96% 98%   General: Alert, Awake and Oriented to Person. Appear in mild  distress Eyes: PERRL, Conjunctiva normal ENT: Oral Mucosa clear dry. Neck: difficult to assess JVD, no Abnormal Mass Or lumps Cardiovascular: S1 and S2 Present, no Murmur, Respiratory: Bilateral Air entry equal and Decreased, Clear to Auscultation, no Crackles, no wheezes Abdomen: Bowel Sound present, Soft and no tenderness Skin: no redness, no Rash  Extremities: trace Pedal edema, no calf tenderness Neurologic: Grossly no focal neuro deficit. Bilaterally Equal motor strength Data Reviewed: CBC:  Recent Labs Lab 08/05/15 1742 08/06/15 0329 08/07/15 0530  WBC 14.7* 12.9* 15.1*  NEUTROABS 12.9*  --  12.1*  HGB 14.2 13.2 13.0  HCT 43.8 42.4 39.7  MCV 90.3 91.6 88.6  PLT 182 173 325*   Basic Metabolic Panel:  Recent Labs Lab 08/05/15 1742 08/06/15 0329 08/07/15 0530  NA 141 138 140  K 3.8 3.7 3.3*  CL 107 107 107  CO2 '27 26 26  '$ GLUCOSE 114* 120* 108*  BUN '20 18 17  '$ CREATININE 0.81 0.79 0.76  CALCIUM 9.9 8.7* 8.4*  MG  --   --  1.9    Liver Function Tests:  Recent Labs Lab 08/05/15 1742 08/07/15 0530  AST 23 19  ALT 38 26  ALKPHOS 117 90  BILITOT 1.2 1.0  PROT 7.2 6.6  ALBUMIN 4.2 3.5   No results for input(s): LIPASE, AMYLASE in the last 168 hours. No results for input(s): AMMONIA in the last 168 hours. Coagulation Profile: No results for input(s): INR, PROTIME in the last 168 hours. Cardiac Enzymes: No results for input(s): CKTOTAL, CKMB, CKMBINDEX, TROPONINI in the last 168 hours. BNP (last 3 results) No results for input(s): PROBNP in the last 8760 hours.  CBG: No results for input(s): GLUCAP in the last 168 hours.  Studies: No results found.   Scheduled Meds: . aspirin EC  81 mg Oral Q breakfast  . atorvastatin  10 mg Oral QHS  . buPROPion  75 mg Oral Q breakfast  . calcium carbonate  1 tablet Oral Q breakfast  . cholecalciferol  1,000 Units Oral Daily  . enoxaparin (LOVENOX) injection  40 mg Subcutaneous Q24H  . imipenem-cilastatin  500  mg Intravenous Q6H  . methenamine  1,000 mg Oral BID  . sertraline  100 mg Oral Q breakfast  . sodium chloride flush  10-40 mL Intracatheter Q12H   Continuous Infusions:   PRN Meds: RESOURCE THICKENUP CLEAR, sodium chloride flush  Time spent: 30 minutes  Author: Berle Mull, MD [facility] Pager: [phone] 08/07/2015 4:10 PM  If 7PM-7AM, please contact night-coverage at [email or web address], password [facility]

## 2015-08-08 LAB — BASIC METABOLIC PANEL
Anion gap: 8 (ref 5–15)
BUN: 15 mg/dL (ref 6–20)
CHLORIDE: 105 mmol/L (ref 101–111)
CO2: 26 mmol/L (ref 22–32)
CREATININE: 0.75 mg/dL (ref 0.61–1.24)
Calcium: 8.5 mg/dL — ABNORMAL LOW (ref 8.9–10.3)
GFR calc Af Amer: >60 mL/min (ref >60)
GFR calc non Af Amer: >60 mL/min (ref >60)
GLUCOSE: 114 mg/dL — AB (ref 65–99)
POTASSIUM: 3.2 mmol/L — AB (ref 3.5–5.1)
SODIUM: 139 mmol/L (ref 135–145)

## 2015-08-08 LAB — MAGNESIUM: MAGNESIUM: 1.9 mg/dL (ref 1.7–2.4)

## 2015-08-08 LAB — CBC
HCT: 41 % (ref 39.0–52.0)
HEMOGLOBIN: 13.1 g/dL (ref 13.0–17.0)
MCH: 28.6 pg (ref 26.0–34.0)
MCHC: 32 g/dL (ref 30.0–36.0)
MCV: 89.5 fL (ref 78.0–100.0)
PLATELETS: 160 10*3/uL (ref 150–400)
RBC: 4.58 MIL/uL (ref 4.22–5.81)
RDW: 15 % (ref 11.5–15.5)
WBC: 14.4 10*3/uL — ABNORMAL HIGH (ref 4.0–10.5)

## 2015-08-08 MED ORDER — OXYBUTYNIN CHLORIDE 5 MG PO TABS
2.5000 mg | ORAL_TABLET | Freq: Two times a day (BID) | ORAL | Status: DC
  Administered 2015-08-08 – 2015-08-10 (×4): 2.5 mg via ORAL
  Filled 2015-08-08 (×4): qty 1

## 2015-08-08 MED ORDER — POTASSIUM CHLORIDE CRYS ER 20 MEQ PO TBCR
40.0000 meq | EXTENDED_RELEASE_TABLET | Freq: Once | ORAL | Status: AC
  Administered 2015-08-08: 40 meq via ORAL
  Filled 2015-08-08: qty 2

## 2015-08-08 MED ORDER — ENSURE ENLIVE PO LIQD
237.0000 mL | Freq: Two times a day (BID) | ORAL | Status: DC
Start: 2015-08-08 — End: 2015-08-10
  Administered 2015-08-08 – 2015-08-10 (×4): 237 mL via ORAL

## 2015-08-08 MED ORDER — POTASSIUM CHLORIDE 20 MEQ PO PACK: 40.0000 meq | PACK | Freq: Once | ORAL | Status: DC

## 2015-08-08 MED ORDER — METOPROLOL SUCCINATE ER 25 MG PO TB24
25.0000 mg | ORAL_TABLET | Freq: Every day | ORAL | Status: DC
  Administered 2015-08-09 (×2): 25 mg via ORAL
  Filled 2015-08-08 (×2): qty 1

## 2015-08-08 MED ORDER — POTASSIUM CHLORIDE 10 MEQ/50ML IV SOLN
10.0000 meq | INTRAVENOUS | Status: AC
  Administered 2015-08-08 (×3): 10 meq via INTRAVENOUS
  Filled 2015-08-08 (×3): qty 50

## 2015-08-08 NOTE — Progress Notes (Signed)
Foley appears to be leaking urine - patient incontinent, however, urine noted in foley bag - flushed per order with Sterile Saline 70m and wife's request per home regimen. However, foley continues to leak. Wife upset that patient is not being followed by urology when "they are directly related to his problem and reason for being here." Wife requesting urology see patient. Notified Dr. PPosey Prontowho spoke to urology. Dr. PPosey Prontostates urology will see patient as a outpatient but not while in the hospital, urology advised to continue size 72F foley, physician states this is likely bladder spasms r/t UTI. Wife updated. Will monitor. Incontinent care provided. Barrier cream and [place] to sacral area and bilateral buttocks. Pt repositioned. Will pass along to oncoming RN.

## 2015-08-08 NOTE — Evaluation (Signed)
Physical Therapy Evaluation Patient Details Name: [name] MRN: [id number] DOB: May 22, 1930 Today's Date: 08/08/2015   History of Present Illness  [name] is a 82 y.o. 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 Pt currently with functional limitations due to the deficits listed below (see PT Problem List).  Pt will benefit from skilled PT to increase their independence and safety with mobility to allow discharge to the venue listed below.   Pt requiring total assist for bed mobility and unable to stand today with +2 assist.  Spouse present and reports pt has had progressive functional decline for several weeks.  Spouse does not feel family or home care will be able to provide required assist.  Pt also with soaked bed pads from urine despite foley catheter and redness along gluteal cleft and scrotal area (RN notified).     Follow Up Recommendations SNF;Supervision/Assistance - 24 hour    Equipment Recommendations  Hospital bed;Other (comment) (hoyer lift)    Recommendations for Other Services       Precautions / Restrictions Precautions Precautions: Fall      Mobility  Bed Mobility Overal bed mobility: Needs Assistance;+2 for physical assistance Bed Mobility: Rolling;Supine to Sit;Sit to Supine Rolling: Total  assist;+2 for physical assistance   Supine to sit: Total assist;+2 for physical assistance Sit to supine: Total assist;+2 for physical assistance   General bed mobility comments: pt requirining increased assist for all bed mobility, multimodal cues and pt initiates however requires assist  Transfers Overall transfer level: Needs assistance Equipment used: Rolling walker (2 wheeled)             General transfer comment: attempted however pt unable to stand with +2 assist  Ambulation/Gait                Stairs            Wheelchair Mobility    Modified Rankin (Stroke Patients Only)       Balance Overall balance assessment: Needs assistance Sitting-balance support: Bilateral upper extremity supported;Feet supported Sitting balance-Leahy Scale: Poor                                       Pertinent Vitals/Pain Pain Assessment: No/denies pain    Home Living   Living Arrangements: Other relatives (granddaughter) Available Help at Discharge: Family Type of Home: House         Home Equipment: Walker - 4 wheels;Shower seat;Grab bars - toilet;Grab bars - tub/shower Additional Comments: spouse and pt live in downstairs apt at granddaughter's home    Prior Function Level of Independence: Needs assistance   Gait / Transfers Assistance Needed: spouse reports he has been able to ambulate short distance with RW however typically will not, mostly transfers     Comments: spouse reports pt has slowly declined over last several weeks, home care has not been able to get him OOB per  spouse due to fear of staff injury     Hand Dominance        Extremity/Trunk Assessment   Upper Extremity Assessment: Generalized weakness           Lower Extremity Assessment: Generalized weakness         Communication   Communication: HOH  Cognition Arousal/Alertness: Awake/alert Behavior During Therapy: Flat affect Overall Cognitive Status:  Impaired/Different from baseline Area of Impairment: Orientation;Memory;Safety/judgement;Following commands;Problem solving Orientation Level: Place;Time   Memory: Decreased short-term memory Following Commands: Follows one step commands inconsistently;Follows one step commands with increased time Safety/Judgement: Decreased awareness of safety;Decreased awareness of deficits   Problem Solving: Decreased initiation;Slow processing;Difficulty sequencing;Requires verbal cues      General Comments      Exercises        Assessment/Plan    PT Assessment Patient needs continued PT services  PT Diagnosis Difficulty walking;Generalized weakness   PT Problem List Decreased strength;Decreased activity tolerance;Decreased balance;Decreased mobility;Decreased safety awareness;Decreased knowledge of use of DME  PT Treatment Interventions DME instruction;Gait training;Functional mobility training;Patient/family education;Therapeutic activities;Therapeutic exercise;Wheelchair mobility training;Balance training   PT Goals (Current goals can be found in the Care Plan section) Acute Rehab PT Goals PT Goal Formulation: With patient/family Time For Goal Achievement: 08/22/15 Potential to Achieve Goals: Fair    Frequency Min 3X/week   Barriers to discharge        Co-evaluation               End of Session   Activity Tolerance: Patient limited by fatigue Patient left: in bed;with call bell/phone within reach;with bed alarm set;with family/visitor present           Time: 1419-1443 PT Time Calculation (min) (ACUTE ONLY): 24 min   Charges:   PT Evaluation $PT Eval Moderate Complexity: 1 Procedure     PT G Codes:        Webb Weed,KATHrine E 08/08/2015, 3:12 PM Carmelia Bake, PT, DPT 08/08/2015 Pager: [phone]

## 2015-08-08 NOTE — Progress Notes (Signed)
[facility] Progress Note  Patient: [name] PYP:[id number]   PCP: [name], MD DOB: 1930-12-07   DOA: 08/05/2015   DOS: 08/08/2015   Date of Service: the patient was seen and examined on 08/08/2015  Subjective: Patient denies any acute complaint. No nausea or vomiting. Orientation is improving the family feels is still not at his baseline. Nutrition: Minimal oral intake  Brief hospital course: Pt. with PMH of bladder cancer status post medial neobladder with recurrent UTI; admitted on 08/05/2015, with complaint of confusion, was found to have recurrent UTI. Currently further plan is continue antibiotics  Assessment and Plan: 1 UTI Last admission patient was discharged on cefepime but at present appears to have increasing confusion on the same and appears to have recurrent UTI. Currently patient is on Primaxin and vancomycin and clinically improving. Blood cultures no growth to date, urine culture grows yeast without any bacteria. I discussed with infectious disease will. The patient's encephalopathy can also be secondary to cefepime and therefore recommend to complete a course with Primaxin for 7 days treatment. We will discontinue vancomycin.  2. acute metabolic encephalopathy Clinical improvement noted, likely from cefepime as well as UTI. Underlying dementia cannot be ruled out. Stop IV fluids, continue Supportive care. Speech therapy consult recommended dysphagia type II diet.  3 atrial fibrillation Currently rate controlled. Resume metoprolol for rate control. Continue aspirin. Not on any anticoagulation due to high risk of bleeding as well as recurrent fall.  4 hypertension Stable. Resume Toprol-XL.  5. dementia Continue home medications.  6. depression Continue Zoloft and Wellbutrin.  7. hyperlipidemia Continue Lipitor.  8. Bladder cancer with chronic indwelling Foley catheter. The nurse has reported some increase leakage of the urine. I  discussed with urology who recommends not to increase in size of the Foley catheter and feels that this could be associated with bladder spasm from patient's chronic indwelling Foley catheter situation. We will attempt oxybutynin for the same.  Pain management: When necessary Tylenol Activity: Consulted physical therapy, SNF recommended Bowel regimen: last BM 08/08/2015 Diet: Dysphagia type II diet DVT Prophylaxis: subcutaneous Heparin  Advance goals of care discussion: Full code  Family Communication: no family was present at bedside, at the time of interview.  Disposition:  Discharge to SNF. Expected discharge date: 08/10/2015, pending placement  Consultants: Phone consultation with urology Procedures: none  Antibiotics: Anti-infectives    Start     Dose/Rate Route Frequency Ordered Stop   08/06/15 0200  imipenem-cilastatin (PRIMAXIN) 500 mg in sodium chloride 0.9 % 100 mL IVPB     500 mg 200 mL/hr over 30 Minutes Intravenous Every 6 hours 08/05/15 2054     08/05/15 2200  vancomycin (VANCOCIN) IVPB 1000 mg/200 mL premix  Status:  Discontinued     1,000 mg 200 mL/hr over 60 Minutes Intravenous Every 12 hours 08/05/15 2142 08/07/15 1126   08/05/15 2130  vancomycin (VANCOCIN) IVPB 1000 mg/200 mL premix  Status:  Discontinued     1,000 mg 200 mL/hr over 60 Minutes Intravenous Every 12 hours 08/05/15 2054 08/05/15 2142   08/05/15 1930  imipenem-cilastatin (PRIMAXIN) 500 mg in sodium chloride 0.9 % 100 mL IVPB     500 mg 200 mL/hr over 30 Minutes Intravenous  Once 08/05/15 1925 08/05/15 2038        Intake/Output Summary (Last 24 hours) at 08/08/15 1758 Last data filed at 08/08/15 1500  Gross per 24 hour  Intake   2450 ml  Output    450 ml  Net  2000 ml   Filed Weights   08/05/15 2143  Weight: 85.9 kg (189 lb 6 oz)    Objective: Physical Exam: Filed Vitals:   08/07/15 1300 08/07/15 2100 08/08/15 0626 08/08/15 1500  BP: 131/70 161/96 159/97 143/83  Pulse: 79 80 94  85  Temp: 98.2 F (36.8 C) 99 F (37.2 C) 98.5 F (36.9 C) 97.5 F (36.4 C)  TempSrc: Oral Oral Oral Oral  Resp: '18 20 18 20  '$ Height:      Weight:      SpO2: 98% 98% 94% 99%   General: Alert, Awake and Oriented to Person. Appear in mild distress Eyes: PERRL, Conjunctiva normal ENT: Oral Mucosa clear dry. Neck: difficult to assess JVD, no Abnormal Mass Or lumps Cardiovascular: S1 and S2 Present, no Murmur, Respiratory: Bilateral Air entry equal and Decreased, Clear to Auscultation, no Crackles, no wheezes Abdomen: Bowel Sound present, Soft and no tenderness Skin: no redness, no Rash  Extremities: trace Pedal edema, no calf tenderness Neurologic: Grossly no focal neuro deficit. Bilaterally Equal motor strength Data Reviewed: CBC:  Recent Labs Lab 08/05/15 1742 08/06/15 0329 08/07/15 0530 08/08/15 0500  WBC 14.7* 12.9* 15.1* 14.4*  NEUTROABS 12.9*  --  12.1*  --   HGB 14.2 13.2 13.0 13.1  HCT 43.8 42.4 39.7 41.0  MCV 90.3 91.6 88.6 89.5  PLT 182 173 136* 144   Basic Metabolic Panel:  Recent Labs Lab 08/05/15 1742 08/06/15 0329 08/07/15 0530 08/08/15 0500  NA 141 138 140 139  K 3.8 3.7 3.3* 3.2*  CL 107 107 107 105  CO2 '27 26 26 26  '$ GLUCOSE 114* 120* 108* 114*  BUN '20 18 17 15  '$ CREATININE 0.81 0.79 0.76 0.75  CALCIUM 9.9 8.7* 8.4* 8.5*  MG  --   --  1.9 1.9    Liver Function Tests:  Recent Labs Lab 08/05/15 1742 08/07/15 0530  AST 23 19  ALT 38 26  ALKPHOS 117 90  BILITOT 1.2 1.0  PROT 7.2 6.6  ALBUMIN 4.2 3.5   No results for input(s): LIPASE, AMYLASE in the last 168 hours. No results for input(s): AMMONIA in the last 168 hours. Coagulation Profile: No results for input(s): INR, PROTIME in the last 168 hours. Cardiac Enzymes: No results for input(s): CKTOTAL, CKMB, CKMBINDEX, TROPONINI in the last 168 hours. BNP (last 3 results) No results for input(s): PROBNP in the last 8760 hours.  CBG: No results for input(s): GLUCAP in the last 168  hours.  Studies: No results found.   Scheduled Meds: . aspirin EC  81 mg Oral Q breakfast  . atorvastatin  10 mg Oral QHS  . buPROPion  75 mg Oral Q breakfast  . calcium carbonate  1 tablet Oral Q breakfast  . cholecalciferol  1,000 Units Oral Daily  . enoxaparin (LOVENOX) injection  40 mg Subcutaneous Q24H  . feeding supplement (ENSURE ENLIVE)  237 mL Oral BID BM  . imipenem-cilastatin  500 mg Intravenous Q6H  . methenamine  1,000 mg Oral BID  . sertraline  100 mg Oral Q breakfast  . sodium chloride flush  10-40 mL Intracatheter Q12H   Continuous Infusions:   PRN Meds: RESOURCE THICKENUP CLEAR, sodium chloride flush  Time spent: 30 minutes  Author: Berle Mull, MD [facility] Pager: [phone] 08/08/2015 5:58 PM  If 7PM-7AM, please contact night-coverage at [email or web address], password [facility]

## 2015-08-08 NOTE — Progress Notes (Signed)
Leakage around cath continues - I have irrigated with a total of 120 cc's and gotten a total of 500 cc's return of amber urine with mucous.  Foley is now noted to be draining well after irrigation.  Will continue to monitor

## 2015-08-09 DIAGNOSIS — L8990 Pressure ulcer of unspecified site, unspecified stage: Secondary | ICD-10-CM

## 2015-08-09 LAB — CBC
HEMATOCRIT: 40.4 % (ref 39.0–52.0)
HEMOGLOBIN: 12.8 g/dL — AB (ref 13.0–17.0)
MCH: 28.6 pg (ref 26.0–34.0)
MCHC: 31.7 g/dL (ref 30.0–36.0)
MCV: 90.2 fL (ref 78.0–100.0)
Platelets: 176 10*3/uL (ref 150–400)
RBC: 4.48 MIL/uL (ref 4.22–5.81)
RDW: 15 % (ref 11.5–15.5)
WBC: 11.2 10*3/uL — AB (ref 4.0–10.5)

## 2015-08-09 MED ORDER — AMLODIPINE BESYLATE 5 MG PO TABS
5.0000 mg | ORAL_TABLET | Freq: Every day | ORAL | Status: DC
  Administered 2015-08-09 – 2015-08-10 (×2): 5 mg via ORAL
  Filled 2015-08-09 (×2): qty 1

## 2015-08-09 MED ORDER — ASPIRIN 81 MG PO CHEW
81.0000 mg | CHEWABLE_TABLET | Freq: Every day | ORAL | Status: DC
  Administered 2015-08-09 – 2015-08-10 (×2): 81 mg via ORAL
  Filled 2015-08-09 (×2): qty 1

## 2015-08-09 NOTE — Clinical Social Work Note (Signed)
CSW contacted patient's granddaughter who is in agreement to having patient go to SNF for short term rehab.  Patient and family are interested in [facility], CSW contacted [facility] and they have to review patient's information on Monday to make decision.  CSW informed patient's granddaughter that if [facility] is not able to take patient, they will have to look at other possibilities.  Patient's granddaughter expressed understanding, but is hopeful for [facility], CSW told grandaughter that weekday social worker will follow up with her on Monday with bed offers.  CSW to continue to follow patient's progress, throughout discharge plan.  Jones Broom. [place], MSW, [place] 08/09/2015 3:11 PM

## 2015-08-09 NOTE — Clinical Social Work Placement (Signed)
   CLINICAL SOCIAL WORK PLACEMENT  NOTE  Date:  08/09/2015  Patient Details  Name: [name] MRN: [id number] Date of Birth: 10/28/1930  Clinical Social Work is seeking post-discharge placement for this patient at the [place] level of care (*CSW will initial, date and re-position this form in  chart as items are completed):  Yes   Patient/family provided with [place] Work Department's list of facilities offering this level of care within the geographic area requested by the patient (or if unable, by the patient's family).  Yes   Patient/family informed of their freedom to choose among providers that offer the needed level of care, that participate in Medicare, Medicaid or managed care program needed by the patient, have an available bed and are willing to accept the patient.  Yes   Patient/family informed of [facility] ownership interest in [facility] and [facility], as well as of the fact that they are under no obligation to receive care at these facilities.  PASRR submitted to EDS on 08/09/15     PASRR number received on       Existing PASRR number confirmed on 08/09/15     FL2 transmitted to all facilities in geographic area requested by pt/family on 08/09/15     FL2 transmitted to all facilities within larger geographic area on       Patient informed that his/her managed care company has contracts with or will negotiate with certain facilities, including the following:            Patient/family informed of bed offers received.  Patient chooses bed at       Physician recommends and patient chooses bed at      Patient to be transferred to   on  .  Patient to be transferred to facility by       Patient family notified on   of transfer.  Name of family member notified:        PHYSICIAN Please sign FL2, Please sign DNR     Additional Comment:    _______________________________________________ Ross Ludwig, LCSWA 08/09/2015, 2:47 PM

## 2015-08-09 NOTE — NC FL2 (Signed)
[place] MEDICAID FL2 LEVEL OF CARE SCREENING TOOL     IDENTIFICATION  Patient Name: [name] Birthdate: 1930-06-14 Sex: male Admission Date (Current Location): 08/05/2015  [facility] and [place] Number:  Herbalist and Address:  [facility],  [address] [place], [place]      Provider Number: [id number]  Attending Physician Name and Address:  Lavina Hamman, MD  Relative Name and Phone Number:       Profitt,Lindsay Grandaughter   [phone]     Current Level of Care: Hospital Recommended Level of Care: [place] Prior Approval Number:    Date Approved/Denied:   PASRR Number: [id number]  Discharge Plan: SNF    Current Diagnoses: Patient Active Problem List   Diagnosis Date Noted  . UTI (urinary tract infection) 08/07/2015  . Pyelonephritis 07/22/2015  . E. coli UTI 07/22/2015  . Leukocytosis 07/22/2015  . Dehydration 07/22/2015  . Rectal bleeding 07/22/2015  . Pressure ulcer 05/31/2015  . Hip pain   . Encephalopathy, metabolic   . GERD (gastroesophageal reflux disease) 08/19/2014  . Depression 08/19/2014  . UTI (lower urinary tract infection) 08/19/2014  . UTI (urinary tract infection) due to Enterococcus faecium 05/21/2014  . Bradycardia 05/03/2012  . Fall 04/30/2012  . Low back pain 04/30/2012  . Lung cancer ([place]) 10/10/2011  . Atrial fibrillation ([place]) 10/10/2011  . History of stroke 10/10/2011  . Malignant neoplasm of bladder ([place]) 03/13/2008  . HLD (hyperlipidemia) 03/13/2008  . HYPERTENSION, BENIGN 02/13/2008    Orientation RESPIRATION BLADDER Height & Weight     Self, Place  O2 (2L) Indwelling catheter Weight: 189 lb 6 oz (85.9 kg) Height:  '6\' 1"'$  (185.4 cm)  BEHAVIORAL SYMPTOMS/MOOD NEUROLOGICAL BOWEL NUTRITION STATUS      Continent Diet (Mechanical Soft)  AMBULATORY STATUS COMMUNICATION OF NEEDS Skin    Limited assistance Verbally PU Stage and Appropriate Care PU Stage 1 Dressing:  Daily PU Stage 2 Dressing: Daily                   Personal Care Assistance Level of Assistance  Bathing, Dressing,   Limited assistance         Functional Limitations Info             SPECIAL CARE FACTORS FREQUENCY  PT (By licensed PT)     PT Frequency: 5x a week             Contractures Contractures Info: Not present    Additional Factors Info  Code Status, Allergies, Psychotropic Code Status Info: DNR Allergies Info: Sulfa Antibiotics, Sulfonamide Derivatives Psychotropic Info: buPROPion (WELLBUTRIN) tablet 75 mg, sertraline (ZOLOFT) tablet 100 mg         Current Medications (08/09/2015):  This is the current hospital active medication list Current Facility-Administered Medications  Medication Dose Route Frequency Provider Last Rate Last Dose  . amLODipine (NORVASC) tablet 5 mg  5 mg Oral Daily Lavina Hamman, MD   5 mg at 08/09/15 1136  . aspirin chewable tablet 81 mg  81 mg Oral Daily Lavina Hamman, MD   81 mg at 08/09/15 0847  . atorvastatin (LIPITOR) tablet 10 mg  10 mg Oral QHS Gennaro Africa, MD   10 mg at 08/08/15 2216  . buPROPion [facility]) tablet 75 mg  75 mg Oral Q breakfast Gennaro Africa, MD   75 mg at 08/09/15 0933  . calcium carbonate (OS-CAL - dosed in mg of elemental calcium) tablet 500 mg of  elemental calcium  1 tablet Oral Q breakfast Gennaro Africa, MD   500 mg of elemental calcium at 08/09/15 0846  . cholecalciferol (VITAMIN D) tablet 1,000 Units  1,000 Units Oral Daily Gennaro Africa, MD   1,000 Units at 08/09/15 1136  . enoxaparin (LOVENOX) injection 40 mg  40 mg Subcutaneous Q24H Gennaro Africa, MD   40 mg at 08/08/15 2216  . feeding supplement (ENSURE ENLIVE) (ENSURE ENLIVE) liquid 237 mL  237 mL Oral BID BM Lavina Hamman, MD   237 mL at 08/09/15 0921  . imipenem-cilastatin (PRIMAXIN) 500 mg in sodium chloride 0.9 % 100 mL IVPB  500 mg Intravenous Q6H Angela Adam, RPH   500 mg at 08/09/15 0859  . methenamine (MANDELAMINE) tablet 1,000 mg   1,000 mg Oral BID Gennaro Africa, MD   1,000 mg at 08/09/15 1136  . metoprolol succinate (TOPROL-XL) 24 hr tablet 25 mg  25 mg Oral QHS Lavina Hamman, MD   25 mg at 08/09/15 0003  . oxybutynin (DITROPAN) tablet 2.5 mg  2.5 mg Oral BID Lavina Hamman, MD   2.5 mg at 08/09/15 1135  . RESOURCE THICKENUP CLEAR   Oral PRN Lavina Hamman, MD      . sertraline (ZOLOFT) tablet 100 mg  100 mg Oral Q breakfast Gennaro Africa, MD   100 mg at 08/09/15 0846  . sodium chloride flush (NS) 0.9 % injection 10-40 mL  10-40 mL Intracatheter Q12H Lavina Hamman, MD   10 mL at 08/08/15 1000  . sodium chloride flush (NS) 0.9 % injection 10-40 mL  10-40 mL Intracatheter PRN Lavina Hamman, MD         Discharge Medications: Please see discharge summary for a list of discharge medications.  Relevant Imaging Results:  Relevant Lab Results:   Additional Information SSN: [id number]  [name]

## 2015-08-09 NOTE — Progress Notes (Signed)
Since we started irrigating neobladder this shift there has been NO more leaking from foley.  Removed moderate amount of mucousy urine at beginning of shift.    Bp remains elevated at 162/89 this AM but HR has improved to 80's after starting Lopressor XL last night at HS.

## 2015-08-09 NOTE — Clinical Social Work Note (Signed)
Clinical Social Work Assessment  Patient Details  Name: [name] MRN: [id number] Date of Birth: 01/14/1931  Date of referral:  08/09/15               Reason for consult:  Facility Placement                Permission sought to share information with:  Family Supports Permission granted to share information::     Name::     [name],[phone]  Agency::  SNF admissions  Relationship::  Development worker, international aid Information:     Housing/Transportation Living arrangements for the past 2 months:  Single Family Home Source of Information:  Power of Attorney Patient Interpreter Needed:  None Criminal Activity/Legal Involvement Pertinent to Current Situation/Hospitalization:  No - Comment as needed Significant Relationships:  Spouse, Other Family Members Lives with:  Spouse, Relatives Do you feel safe going back to the place where you live?  No Need for family participation in patient care:  Yes (Comment) (Patient has some dementia)  Care giving concerns:  Patient's family is agreeable to patient going to SNF for short term rehab.   Social Worker assessment / plan:  Patient is an 82 year old married male who has some dementia.  Patient is alert and oriented x2, patient's grandaughter is the main contact.  Patient lives with his grandaughter and his wife.  Patient stated he wanted CSW to speak with his grandaughter to help with decision makings.  Patient has been to rehab in the past and they would like to go to [facility] if possible.  CSW explained to patient's grandaughter the role of CSW and process for SNF placement.  Patient's daughter is agreeable for CSW to look for placement in [facility].  Patient's grandaughter expressed she did not have any other questions or concerns.   Employment status:  Retired Forensic scientist:  Medicare PT Recommendations:  [place] / Referral to community resources:      Patient/Family's Response to care:  Patient and family agreeable to going to SNF for short term rehab.  Patient/Family's Understanding of and Emotional Response to Diagnosis, Current Treatment, and Prognosis:  Patient's family aware of current diagnosis and prognosis.  Emotional Assessment Appearance:  Appears stated age Attitude/Demeanor/Rapport:    Affect (typically observed):  Appropriate, Calm, Stable Orientation:  Oriented to Self, Oriented to Place Alcohol / Substance use:  Not Applicable Psych involvement (Current and /or in the community):  No (Comment)  Discharge Needs  Concerns to be addressed:  Lack of Support Readmission within the last 30 days:  Yes (07-24-15 patient went home with grandaughter) Current discharge risk:  None Barriers to Discharge:  Continued Medical Work up   [name] 08/09/2015, 2:41 PM

## 2015-08-09 NOTE — Progress Notes (Signed)
[facility] Progress Note  Patient: [name] RUE:[id number]   PCP: [name], MD DOB: Aug 22, 1930   DOA: 08/05/2015   DOS: 08/09/2015   Date of Service: the patient was seen and examined on 08/09/2015  Subjective: Patient continues to deny any acute complaint and mentions that he is feeling better. He did have some urinary catheter leak yesterday which is currently resolved. Nutrition: Minimal oral intake  Brief hospital course: Pt. with PMH of bladder cancer, S/P radical cystoprostatectomy,. Neobladder, chemotherapy, chronic indwelling Foley catheter, A. fib on an aspirin, HTN, dementia, lung cancer status post resection, depression, with recurrent UTI; admitted on 08/05/2015, with complaint of confusion, was found to have recurrent UTI. Currently further plan is continue antibiotics  Assessment and Plan: 1 UTI Last admission patient was discharged on cefepime but at present appears to have increasing confusion on the same and appears to have recurrent UTI. Initially patient was started on  Primaxin and vancomycin and later on narrowed down to Primaxin only, clinically improving. Blood cultures no growth to date, urine culture grows yeast without any bacteria. I discussed with infectious disease. The patient's encephalopathy can also be secondary to cefepime and therefore recommend to complete a course with Primaxin for 7 days treatment. Avoiding Invanz due to possible CNS toxicity as well.  2. acute metabolic encephalopathy Clinical improvement noted, likely from cefepime as well as UTI. Underlying dementia cannot be ruled out. Stop IV fluids, continue Supportive care. Speech therapy consult recommended dysphagia type II diet.  3 atrial fibrillation Currently rate controlled. Resume metoprolol for rate control. Continue aspirin. Not on any anticoagulation due to high risk of bleeding as well as recurrent fall.  4 hypertension Stable. Resume Toprol-XL.  5.  dementia Continue home medications.  6. depression Continue Zoloft and Wellbutrin.  7. hyperlipidemia Continue Lipitor.  8. Bladder cancer with chronic indwelling Foley catheter. The nurse has reported some increase leakage of the urine. I discussed with urology who recommends not to increase in size of the Foley catheter and feels that this could be associated with bladder spasm from patient's chronic indwelling Foley catheter situation. We will attempt oxybutynin for the same.  Pain management: When necessary Tylenol Activity: Consulted physical therapy, SNF recommended, patient was also recommended SNF last admission and return to hospital in few days. Recommended strongly SNF placement. Bowel regimen: last BM 08/08/2015 Diet: Dysphagia type II diet DVT Prophylaxis: subcutaneous Heparin  Advance goals of care discussion: DNR/DNI, based on discussion with patient's granddaughter who is POA  Family Communication: no family was present at bedside, at the time of interview. Discussed with patient's granddaughter on phone  Disposition:  Discharge to SNF. Expected discharge date: 08/10/2015, pending placement  Consultants: Phone consultation with urology Procedures: none  Antibiotics: Anti-infectives    Start     Dose/Rate Route Frequency Ordered Stop   08/06/15 0200  imipenem-cilastatin (PRIMAXIN) 500 mg in sodium chloride 0.9 % 100 mL IVPB     500 mg 200 mL/hr over 30 Minutes Intravenous Every 6 hours 08/05/15 2054     08/05/15 2200  vancomycin (VANCOCIN) IVPB 1000 mg/200 mL premix  Status:  Discontinued     1,000 mg 200 mL/hr over 60 Minutes Intravenous Every 12 hours 08/05/15 2142 08/07/15 1126   08/05/15 2130  vancomycin (VANCOCIN) IVPB 1000 mg/200 mL premix  Status:  Discontinued     1,000 mg 200 mL/hr over 60 Minutes Intravenous Every 12 hours 08/05/15 2054 08/05/15 2142   08/05/15 1930  imipenem-cilastatin (PRIMAXIN) 500  mg in sodium chloride 0.9 % 100 mL IVPB     500  mg 200 mL/hr over 30 Minutes Intravenous  Once 08/05/15 1925 08/05/15 2038        Intake/Output Summary (Last 24 hours) at 08/09/15 1937 Last data filed at 08/09/15 1848  Gross per 24 hour  Intake    880 ml  Output   2451 ml  Net  -1571 ml   Filed Weights   08/05/15 2143  Weight: 85.9 kg (189 lb 6 oz)    Objective: Physical Exam: Filed Vitals:   08/08/15 1500 08/08/15 2135 08/09/15 0513 08/09/15 1427  BP: 143/83 168/70 162/89 157/93  Pulse: 85 65 89 89  Temp: 97.5 F (36.4 C) 97.4 F (36.3 C) 97.5 F (36.4 C) 97.8 F (36.6 C)  TempSrc: Oral Oral Oral Oral  Resp: '20 20 20 20  '$ Height:      Weight:      SpO2: 99% 98% 91% 94%   General: Alert, Awake and Oriented to Person. Appear in mild distress Eyes: PERRL, Conjunctiva normal ENT: Oral Mucosa clear dry. Neck: difficult to assess JVD, no Abnormal Mass Or lumps Cardiovascular: S1 and S2 Present, no Murmur, Respiratory: Bilateral Air entry equal and Decreased, Clear to Auscultation, no Crackles, no wheezes Abdomen: Bowel Sound present, Soft and no tenderness Skin: no redness, no Rash  Extremities: trace Pedal edema, no calf tenderness Neurologic: Grossly no focal neuro deficit. Bilaterally Equal motor strength, No asterixis Data Reviewed: CBC:  Recent Labs Lab 08/05/15 1742 08/06/15 0329 08/07/15 0530 08/08/15 0500 08/09/15 0400  WBC 14.7* 12.9* 15.1* 14.4* 11.2*  NEUTROABS 12.9*  --  12.1*  --   --   HGB 14.2 13.2 13.0 13.1 12.8*  HCT 43.8 42.4 39.7 41.0 40.4  MCV 90.3 91.6 88.6 89.5 90.2  PLT 182 173 136* 160 811   Basic Metabolic Panel:  Recent Labs Lab 08/05/15 1742 08/06/15 0329 08/07/15 0530 08/08/15 0500  NA 141 138 140 139  K 3.8 3.7 3.3* 3.2*  CL 107 107 107 105  CO2 '27 26 26 26  '$ GLUCOSE 114* 120* 108* 114*  BUN '20 18 17 15  '$ CREATININE 0.81 0.79 0.76 0.75  CALCIUM 9.9 8.7* 8.4* 8.5*  MG  --   --  1.9 1.9    Liver Function Tests:  Recent Labs Lab 08/05/15 1742 08/07/15 0530   AST 23 19  ALT 38 26  ALKPHOS 117 90  BILITOT 1.2 1.0  PROT 7.2 6.6  ALBUMIN 4.2 3.5   No results for input(s): LIPASE, AMYLASE in the last 168 hours. No results for input(s): AMMONIA in the last 168 hours. Coagulation Profile: No results for input(s): INR, PROTIME in the last 168 hours. Cardiac Enzymes: No results for input(s): CKTOTAL, CKMB, CKMBINDEX, TROPONINI in the last 168 hours. BNP (last 3 results) No results for input(s): PROBNP in the last 8760 hours.  CBG: No results for input(s): GLUCAP in the last 168 hours.  Studies: No results found.   Scheduled Meds: . amLODipine  5 mg Oral Daily  . aspirin  81 mg Oral Daily  . atorvastatin  10 mg Oral QHS  . buPROPion  75 mg Oral Q breakfast  . calcium carbonate  1 tablet Oral Q breakfast  . cholecalciferol  1,000 Units Oral Daily  . enoxaparin (LOVENOX) injection  40 mg Subcutaneous Q24H  . feeding supplement (ENSURE ENLIVE)  237 mL Oral BID BM  . imipenem-cilastatin  500 mg Intravenous Q6H  . methenamine  1,000 mg Oral BID  . metoprolol succinate  25 mg Oral QHS  . oxybutynin  2.5 mg Oral BID  . sertraline  100 mg Oral Q breakfast  . sodium chloride flush  10-40 mL Intracatheter Q12H   Continuous Infusions:   PRN Meds: RESOURCE THICKENUP CLEAR, sodium chloride flush  Time spent: 30 minutes  Author: Berle Mull, MD [facility] Pager: [phone] 08/09/2015 7:37 PM  If 7PM-7AM, please contact night-coverage at [email or web address], password [facility]

## 2015-08-10 DIAGNOSIS — M6281 Muscle weakness (generalized): Secondary | ICD-10-CM | POA: Diagnosis not present

## 2015-08-10 DIAGNOSIS — S32019A Unspecified fracture of first lumbar vertebra, initial encounter for closed fracture: Secondary | ICD-10-CM | POA: Diagnosis not present

## 2015-08-10 DIAGNOSIS — F329 Major depressive disorder, single episode, unspecified: Secondary | ICD-10-CM

## 2015-08-10 DIAGNOSIS — G9341 Metabolic encephalopathy: Secondary | ICD-10-CM

## 2015-08-10 DIAGNOSIS — N390 Urinary tract infection, site not specified: Secondary | ICD-10-CM | POA: Diagnosis not present

## 2015-08-10 DIAGNOSIS — W1830XA Fall on same level, unspecified, initial encounter: Secondary | ICD-10-CM | POA: Diagnosis not present

## 2015-08-10 DIAGNOSIS — R41841 Cognitive communication deficit: Secondary | ICD-10-CM | POA: Diagnosis not present

## 2015-08-10 DIAGNOSIS — I4891 Unspecified atrial fibrillation: Secondary | ICD-10-CM | POA: Diagnosis not present

## 2015-08-10 DIAGNOSIS — M5144 Schmorl's nodes, thoracic region: Secondary | ICD-10-CM | POA: Diagnosis not present

## 2015-08-10 DIAGNOSIS — S22080A Wedge compression fracture of T11-T12 vertebra, initial encounter for closed fracture: Secondary | ICD-10-CM | POA: Diagnosis not present

## 2015-08-10 DIAGNOSIS — R5381 Other malaise: Secondary | ICD-10-CM | POA: Diagnosis not present

## 2015-08-10 DIAGNOSIS — M545 Low back pain: Secondary | ICD-10-CM | POA: Diagnosis not present

## 2015-08-10 DIAGNOSIS — S22088A Other fracture of T11-T12 vertebra, initial encounter for closed fracture: Secondary | ICD-10-CM | POA: Diagnosis not present

## 2015-08-10 DIAGNOSIS — I482 Chronic atrial fibrillation: Secondary | ICD-10-CM | POA: Diagnosis not present

## 2015-08-10 DIAGNOSIS — I10 Essential (primary) hypertension: Secondary | ICD-10-CM | POA: Diagnosis not present

## 2015-08-10 DIAGNOSIS — K219 Gastro-esophageal reflux disease without esophagitis: Secondary | ICD-10-CM

## 2015-08-10 DIAGNOSIS — R2689 Other abnormalities of gait and mobility: Secondary | ICD-10-CM | POA: Diagnosis not present

## 2015-08-10 DIAGNOSIS — C679 Malignant neoplasm of bladder, unspecified: Secondary | ICD-10-CM | POA: Diagnosis not present

## 2015-08-10 DIAGNOSIS — M546 Pain in thoracic spine: Secondary | ICD-10-CM | POA: Diagnosis not present

## 2015-08-10 DIAGNOSIS — E785 Hyperlipidemia, unspecified: Secondary | ICD-10-CM | POA: Diagnosis not present

## 2015-08-10 DIAGNOSIS — I480 Paroxysmal atrial fibrillation: Secondary | ICD-10-CM | POA: Diagnosis not present

## 2015-08-10 DIAGNOSIS — R4182 Altered mental status, unspecified: Secondary | ICD-10-CM | POA: Diagnosis not present

## 2015-08-10 DIAGNOSIS — F0390 Unspecified dementia without behavioral disturbance: Secondary | ICD-10-CM | POA: Diagnosis not present

## 2015-08-10 DIAGNOSIS — R278 Other lack of coordination: Secondary | ICD-10-CM | POA: Diagnosis not present

## 2015-08-10 DIAGNOSIS — R1310 Dysphagia, unspecified: Secondary | ICD-10-CM | POA: Diagnosis not present

## 2015-08-10 LAB — CULTURE, BLOOD (ROUTINE X 2)
Culture: NO GROWTH
Culture: NO GROWTH

## 2015-08-10 MED ORDER — HEPARIN SOD (PORK) LOCK FLUSH 100 UNIT/ML IV SOLN
250.0000 [IU] | INTRAVENOUS | Status: AC | PRN
  Administered 2015-08-10: 250 [IU]

## 2015-08-10 MED ORDER — AMLODIPINE BESYLATE 5 MG PO TABS: 5.0000 mg | ORAL_TABLET | Freq: Every day | ORAL | Status: AC

## 2015-08-10 MED ORDER — SODIUM CHLORIDE 0.9 % IV SOLN: 500.0000 mg | Freq: Four times a day (QID) | INTRAVENOUS | Status: AC

## 2015-08-10 MED ORDER — RESOURCE THICKENUP CLEAR PO POWD: ORAL | Status: DC

## 2015-08-10 MED ORDER — ENSURE ENLIVE PO LIQD: 237.0000 mL | Freq: Two times a day (BID) | ORAL | Status: DC

## 2015-08-10 MED ORDER — OXYBUTYNIN CHLORIDE 5 MG PO TABS: 2.5000 mg | ORAL_TABLET | Freq: Two times a day (BID) | ORAL | Status: DC

## 2015-08-10 NOTE — Clinical Social Work Placement (Signed)
Patient has a bed at [facility], grandaughter, [name] made aware. CSW has completed FL2 & will continue to follow and assist with discharge when ready.    Raynaldo Opitz, [facility] Clinical Social Worker cell #: [phone]     CLINICAL SOCIAL WORK PLACEMENT  NOTE  Date:  08/10/2015  Patient Details  Name: [name] MRN: [id number] Date of Birth: [birth year]  Clinical Social Work is seeking post-discharge placement for this patient at the [place] level of care (*CSW will initial, date and re-position this form in  chart as items are completed):  Yes   Patient/family provided with [place] Work Department's list of facilities offering this level of care within the geographic area requested by the patient (or if unable, by the patient's family).  Yes   Patient/family informed of their freedom to choose among providers that offer the needed level of care, that participate in Medicare, Medicaid or managed care program needed by the patient, have an available bed and are willing to accept the patient.  Yes   Patient/family informed of [facility]'s ownership interest in [facility] and [facility], as well as of the fact that they are under no obligation to receive care at these facilities.  PASRR submitted to EDS on 08/09/15     PASRR number received on       Existing PASRR number confirmed on 08/09/15     FL2 transmitted to all facilities in geographic area requested by pt/family on 08/09/15     FL2 transmitted to all facilities within larger geographic area on       Patient informed that his/her managed care company has contracts with or will negotiate with certain facilities, including the following:        Yes   Patient/family informed of bed offers received.  Patient chooses bed at [place], [place]     Physician recommends and patient chooses bed at      Patient to be  transferred to [place], [place] on  .  Patient to be transferred to facility by       Patient family notified on   of transfer.  Name of family member notified:        PHYSICIAN Please sign FL2, Please sign DNR     Additional Comment:    _______________________________________________ Standley Brooking, LCSW 08/10/2015, 10:34 AM

## 2015-08-10 NOTE — Progress Notes (Signed)
Pharmacy Antibiotic Note  [name] is a 82 y.o. male admitted on 08/05/2015 with sepsis/UTI.   Pharmacy has been consulted for Primaxin dosing. Vancomycin was stopped in light of culture results.   Now with MDR Ecoli.  Plan to treat x 7days (currently on day#5 of therapy).    Today, 08/10/2015  Day #5 antibiotics - WBC trending down - SCr stable -patient clinically improved  Plan:  Continue primaxin '500mg'$  IV q6h to complete 7 day course  Height: '6\' 1"'$  (185.4 cm) Weight: 189 lb 6 oz (85.9 kg) IBW/kg (Calculated) : 79.9  Temp (24hrs), Avg:98.4 F (36.9 C), Min:97.8 F (36.6 C), Max:98.9 F (37.2 C)   Recent Labs Lab 08/05/15 1742 08/05/15 1753 08/06/15 0329 08/07/15 0530 08/08/15 0500 08/09/15 0400  WBC 14.7*  --  12.9* 15.1* 14.4* 11.2*  CREATININE 0.81  --  0.79 0.76 0.75  --   LATICACIDVEN  --  0.83  --   --   --   --     Estimated Creatinine Clearance: 80.5 mL/min (by C-G formula based on Cr of 0.75).    Allergies  Allergen Reactions  . Sulfa Antibiotics Nausea And Vomiting  . Sulfonamide Derivatives Nausea And Vomiting    Antimicrobials this admission: 6/14 vancomycin >> 6/16 6/14 primaxin  Microbiology results: 5/31 UCx: >100K EColi (R-amp, unasyn, ancef, cipro, nitrofurantoin, bactrim, I-CTX, S-gent,primaxin, zosyn) 6/14 BCx: NGTD 6/14 UCx: 40K yeast  Thank you for allowing pharmacy to be a part of this patient's care.  Netta Cedars, PharmD, BCPS Pager: [phone] 08/10/2015 10:01 AM

## 2015-08-10 NOTE — Progress Notes (Signed)
Report called to Myriam Jacobson, Therapist, sports at [facility]. Pt is stable for discharge. PICC line flushed and capped by IV RN.   Othella Boyer [facility] 08/10/2015 12:52 PM

## 2015-08-10 NOTE — Discharge Instructions (Signed)

## 2015-08-10 NOTE — Clinical Social Work Placement (Signed)
Patient is set to discharge to [facility] today. Patient & grand-daughter, [name] made aware. Discharge packet given to RN, Janett Billow. [facility] called for transport.     Raynaldo Opitz, [facility] Clinical Social Worker cell #: [phone]    CLINICAL SOCIAL WORK PLACEMENT  NOTE  Date:  08/10/2015  Patient Details  Name: [name] MRN: [id number] Date of Birth: [birth year]  Clinical Social Work is seeking post-discharge placement for this patient at the [place] level of care (*CSW will initial, date and re-position this form in  chart as items are completed):  Yes   Patient/family provided with [place] Work Department's list of facilities offering this level of care within the geographic area requested by the patient (or if unable, by the patient's family).  Yes   Patient/family informed of their freedom to choose among providers that offer the needed level of care, that participate in Medicare, Medicaid or managed care program needed by the patient, have an available bed and are willing to accept the patient.  Yes   Patient/family informed of [facility]'s ownership interest in [facility] and [facility], as well as of the fact that they are under no obligation to receive care at these facilities.  PASRR submitted to EDS on 08/09/15     PASRR number received on       Existing PASRR number confirmed on 08/09/15     FL2 transmitted to all facilities in geographic area requested by pt/family on 08/09/15     FL2 transmitted to all facilities within larger geographic area on       Patient informed that his/her managed care company has contracts with or will negotiate with certain facilities, including the following:        Yes   Patient/family informed of bed offers received.  Patient chooses bed at [place], [place]     Physician recommends and patient chooses bed at      Patient  to be transferred to [place], [place] on 08/10/15.  Patient to be transferred to facility by [facility]     Patient family notified on 08/10/15 of transfer.  Name of family member notified:  patient's grandaughter, [name] via phone     PHYSICIAN       Additional Comment:    _______________________________________________ Standley Brooking, LCSW 08/10/2015, 12:43 PM

## 2015-08-10 NOTE — Discharge Summary (Signed)
Physician Discharge Summary  PAULO KEIMIG MWN:[id number] DOB: December 13, 1930 DOA: 08/05/2015  PCP: [name], MD  Admit date: 08/05/2015 Discharge date: 08/10/2015  Time spent: 45 minutes  Recommendations for Outpatient Follow-up:  Patient will be discharged to skilled nursing facility. Continue physical and occupational therapy as recommended.  Patient will need to follow up with primary care provider within one week of discharge.  Follow up with urology. Patient should continue medications as prescribed.  Patient should follow a dysphagia 2 diet.   Discharge Diagnoses:  UTI Acute metabolic encephalopathy Atrial fibrillation Hypertension Dementia Depression Hyperlipidemia Bladder cancer with indwelling Foley catheter  Discharge Condition: Stable  Diet recommendation: dysphagia 2  Filed Weights   08/05/15 2143  Weight: 85.9 kg (189 lb 6 oz)    History of present illness:  On 08/05/2015 by Dr. Gennaro Africa Patient is a 82 yo male with history of invasive bladder cancer s/p radical cystoprostatectomy and ileal neobladder with postop chemo, with history of recurrent UTIs s/p chronic foley catheter for 1 year who was brought to ED with cc of increasing confusion. History was taken from daughter. She said patient was discharged from here earlier this month with diagnosis of UTI on IV cefepime at home. He has not had clinical improvement after discharge but actually he started getting confused and getting chills. He pulled his catheter yesterday and they had to bring him to the ED. Today he was so confused that she decided to bring him in. He has fever and chills. He has no complaints when I talked to him. Both denied any N/V/D/C/abd pain/dysuria/chest pain/dyspnea/cough. Urine has been looking more cloudy and smelly.   Hospital Course:  UTI -Last admission patient was discharged on cefepime but at present appears to have increasing confusion on the same and appears to have  recurrent UTI. -Initially patient was started on Primaxin and vancomycin and later on narrowed down to Primaxin only, clinically improving. -Blood cultures no growth to date, urine culture grows yeast without any bacteria. -Case discussed by previous hospitalist with infectious disease- recommended Primaxin for 7 days total, thought that cefepime may have affect on encephalopathy -Avoiding Invanz due to possible CNS toxicity as well. -PICC placed  Acute metabolic encephalopathy -Clinical improvement noted, likely from cefepime as well as UTI. -Underlying dementia cannot be ruled out. -Speech therapy consult recommended dysphagia type II diet.  Atrial fibrillation -Currently rate controlled, continue metoprolol -Continue  aspirin. -Not on any anticoagulation due to high risk of bleeding as well as recurrent fall. -CHADSVASC 3 (age, HTN)  Hypertension -Stable, continue metoprolol  Dementia -Continue home medications.  Depression -Continue Zoloft and Wellbutrin.  Hyperlipidemia -Continue Lipitor.  Bladder cancer with chronic indwelling Foley catheter. -The nurse has reported some increase leakage of the urine. -Previous hospitalist discussed case with urology- who recommended not to increase in size of the Foley catheter and feels that this could be associated with bladder spasm from patient's chronic indwelling Foley catheter situation. -Continue oxybutynin   Procedures: None  Consultations: Urology and infectious disease via phone (by Dr. Berle Mull)  Discharge Exam: Filed Vitals:   08/09/15 2117 08/10/15 0459  BP: 173/90 153/97  Pulse: 86 111  Temp: 98.9 F (37.2 C) 98.5 F (36.9 C)  Resp: 20 20     General: Well developed, well nourished, NAD  HEENT: NCAT, mucous membranes moist.  Cardiovascular: S1 S2 auscultated, irregular   Respiratory: Clear to auscultation  Abdomen: Soft, nontender, nondistended, + bowel sounds  Extremities: warm dry without  cyanosis clubbing or edema  Neuro: AAOx2 (self and place), nonfocal  Psych: Appropriate  Discharge Instructions      Discharge Instructions    Discharge instructions    Complete by:  As directed   Patient will be discharged to skilled nursing facility. Continue physical and occupational therapy as recommended.  Patient will need to follow up with primary care provider within one week of discharge.  Follow up with urology. Patient should continue medications as prescribed.  Patient should follow a dysphagia 2 diet.            Medication List    STOP taking these medications        ceFEPIme 2 g in dextrose 5 % 50 mL     ciprofloxacin 500 MG tablet  Commonly known as:  CIPRO     ibuprofen 400 MG tablet  Commonly known as:  ADVIL,MOTRIN     nitrofurantoin 100 MG capsule  Commonly known as:  MACRODANTIN      TAKE these medications        ALPHA LIPOIC ACID PO  Take 200 mg by mouth every morning.     amLODipine 5 MG tablet  Commonly known as:  NORVASC  Take 1 tablet (5 mg total) by mouth daily.     ascorbic acid 1000 MG tablet  Commonly known as:  VITAMIN C  Take 1,000 mg by mouth every morning.     aspirin EC 81 MG tablet  Take 81 mg by mouth daily with breakfast.     atorvastatin 10 MG tablet  Commonly known as:  LIPITOR  Take 10 mg by mouth at bedtime.     buPROPion 75 MG tablet  Commonly known as:  WELLBUTRIN  Take 75 mg by mouth daily with breakfast.     calcium carbonate 1250 (500 Ca) MG tablet  Commonly known as:  OS-CAL - dosed in mg of elemental calcium  Take 1 tablet by mouth daily with breakfast.     EQL VITAMIN D3 1000 units tablet  Generic drug:  Cholecalciferol  Take 1,000 Units by mouth every morning.     feeding supplement (ENSURE ENLIVE) Liqd  Take 237 mLs by mouth 2 (two) times daily between meals.     hydrocortisone valerate cream 0.2 %  Commonly known as:  WESTCORT  Apply 1 application topically 2 (two) times daily as needed  (itching).     imipenem-cilastatin 500 mg in sodium chloride 0.9 % 100 mL  Inject 500 mg into the vein every 6 (six) hours. Continue through 08/14/2015.     MELATONIN PO  Take 3 mg by mouth at bedtime as needed (sleep).     methenamine 1 g tablet  Commonly known as:  HIPREX  Take 1 g by mouth 2 (two) times daily.     metoprolol succinate 25 MG 24 hr tablet  Commonly known as:  TOPROL-XL  Take 25 mg by mouth at bedtime.     oxybutynin 5 MG tablet  Commonly known as:  DITROPAN  Take 0.5 tablets (2.5 mg total) by mouth 2 (two) times daily.     RESOURCE THICKENUP CLEAR Powd  Use as needed to thicken liquids     saccharomyces boulardii 250 MG capsule  Commonly known as:  FLORASTOR  Take 1 capsule (250 mg total) by mouth 2 (two) times daily.     sertraline 100 MG tablet  Commonly known as:  ZOLOFT  Take 100 mg by mouth daily with breakfast.  VITAMIN B 12 PO  Take 50 mcg by mouth every morning.       Allergies  Allergen Reactions  . Sulfa Antibiotics Nausea And Vomiting  . Sulfonamide Derivatives Nausea And Vomiting   Follow-up Information    Follow up with [name], MD. Schedule an appointment as soon as possible for a visit in 1 week.   Specialty:  Internal Medicine   Why:  Hospital follow up   Contact information:   [address] [place] [address] [phone]        The results of significant diagnostics from this hospitalization (including imaging, microbiology, ancillary and laboratory) are listed below for reference.    Significant Diagnostic Studies: Dg Chest 2 View  08/05/2015  CLINICAL DATA:  Mental status changes EXAM: CHEST  2 VIEW COMPARISON:  07/23/2015 FINDINGS: Cardiomegaly is noted. Study is limited by poor inspiration. Mild basilar atelectasis. Central mild vascular congestion without pulmonary edema. Stable right arm PICC line position. Mild degenerative changes bilateral shoulders. Atherosclerotic calcifications of thoracic aorta.  Mitral valve calcifications. IMPRESSION: Right arm PICC line with tip in SVC right atrium junction. Cardiomegaly. Poor inspiration. Mild basilar atelectasis. No infiltrate or pulmonary edema. Electronically Signed   By: Lahoma Crocker M.D.   On: 08/05/2015 18:20   Dg Chest 2 View  07/22/2015  CLINICAL DATA:  Back pain.  Altered mental status. EXAM: CHEST  2 VIEW COMPARISON:  05/30/2015 chest radiograph. FINDINGS: Surgical clips and sutures overlie the left parahilar region. Stable cardiomediastinal silhouette with top-normal heart size. No pneumothorax. No pleural effusion. Lungs appear clear, with no acute consolidative airspace disease and no pulmonary edema. IMPRESSION: No active cardiopulmonary disease. Electronically Signed   By: Ilona Sorrel M.D.   On: 07/22/2015 12:15   Ct Head Wo Contrast  08/05/2015  CLINICAL DATA:  Mental status changes, history of dementia EXAM: CT HEAD WITHOUT CONTRAST TECHNIQUE: Contiguous axial images were obtained from the base of the skull through the vertex without intravenous contrast. COMPARISON:  07/22/2015 FINDINGS: Brain: No intracranial hemorrhage, mass effect or midline shift. Stable atrophy and chronic white matter disease. Ventricular size is stable from prior exam. No definite acute cortical infarction. No mass lesion is noted on this unenhanced scan. Vascular: Atherosclerotic calcifications of carotid siphon again noted. Skull: Negative for fracture or focal lesion. Sinuses/Orbits: There is mucosal thickening inferior aspect bilateral maxillary sinus. The mastoid air cells are unremarkable. Other: None IMPRESSION: No acute intracranial abnormality. Stable atrophy and chronic white matter disease. Atherosclerotic calcifications of carotid siphon. Electronically Signed   By: Lahoma Crocker M.D.   On: 08/05/2015 19:58   Ct Head Wo Contrast  07/22/2015  CLINICAL DATA:  Confusion EXAM: CT HEAD WITHOUT CONTRAST TECHNIQUE: Contiguous axial images were obtained from the base of  the skull through the vertex without intravenous contrast. COMPARISON:  06/23/2015 FINDINGS: Bony calvarium is intact. No gross soft tissue abnormality is noted. Paranasal sinuses are well aerated. Diffuse atrophic changes are again seen as are chronic white matter ischemic changes. These are stable in appearance from the prior exam. No findings to suggest acute hemorrhage, acute infarction or space-occupying mass lesion are noted. IMPRESSION: Chronic atrophic and ischemic changes without acute intracranial abnormality. Electronically Signed   By: Inez Catalina M.D.   On: 07/22/2015 12:36   Dg Chest Port 1 View  07/23/2015  CLINICAL DATA:  Evaluate central line placement. EXAM: PORTABLE CHEST 1 VIEW COMPARISON:  Chest radiograph 07/22/2015. FINDINGS: Right upper extremity PICC line tip projects over the  superior vena cava. Stable cardiomegaly. Tortuosity of thoracic aorta. No consolidative pulmonary opacities. No pleural effusion or pneumothorax. Mitral annular calcifications. IMPRESSION: Right upper extremity PICC line tip projects over the superior vena cava. Electronically Signed   By: Lovey Newcomer M.D.   On: 07/23/2015 09:18    Microbiology: Recent Results (from the past 240 hour(s))  Urine culture     Status: None   Collection Time: 08/04/15  5:09 PM  Result Value Ref Range Status   Specimen Description URINE, CLEAN CATCH  Final   Special Requests Immunocompromised  Final   Culture NO GROWTH Performed at [facility]   Final   Report Status 08/06/2015 FINAL  Final  Urine culture     Status: Abnormal   Collection Time: 08/05/15  6:32 PM  Result Value Ref Range Status   Specimen Description URINE, CATHETERIZED  Final   Special Requests NONE  Final   Culture 40,000 COLONIES/mL YEAST (A)  Final   Report Status 08/07/2015 FINAL  Final  Blood culture (routine x 2)     Status: None (Preliminary result)   Collection Time: 08/05/15  7:40 PM  Result Value Ref Range Status   Specimen  Description BLOOD LEFT ANTECUBITAL  Final   Special Requests BOTTLES DRAWN AEROBIC AND ANAEROBIC 5 ML  Final   Culture   Final    NO GROWTH 4 DAYS Performed at [facility]    Report Status PENDING  Incomplete  Blood culture (routine x 2)     Status: None (Preliminary result)   Collection Time: 08/05/15  7:45 PM  Result Value Ref Range Status   Specimen Description BLOOD RIGHT ANTECUBITAL  Final   Special Requests BOTTLES DRAWN AEROBIC AND ANAEROBIC 5 ML  Final   Culture   Final    NO GROWTH 4 DAYS Performed at [facility]    Report Status PENDING  Incomplete     Labs: Basic Metabolic Panel:  Recent Labs Lab 08/05/15 1742 08/06/15 0329 08/07/15 0530 08/08/15 0500  NA 141 138 140 139  K 3.8 3.7 3.3* 3.2*  CL 107 107 107 105  CO2 '27 26 26 26  '$ GLUCOSE 114* 120* 108* 114*  BUN '20 18 17 15  '$ CREATININE 0.81 0.79 0.76 0.75  CALCIUM 9.9 8.7* 8.4* 8.5*  MG  --   --  1.9 1.9   Liver Function Tests:  Recent Labs Lab 08/05/15 1742 08/07/15 0530  AST 23 19  ALT 38 26  ALKPHOS 117 90  BILITOT 1.2 1.0  PROT 7.2 6.6  ALBUMIN 4.2 3.5   No results for input(s): LIPASE, AMYLASE in the last 168 hours. No results for input(s): AMMONIA in the last 168 hours. CBC:  Recent Labs Lab 08/05/15 1742 08/06/15 0329 08/07/15 0530 08/08/15 0500 08/09/15 0400  WBC 14.7* 12.9* 15.1* 14.4* 11.2*  NEUTROABS 12.9*  --  12.1*  --   --   HGB 14.2 13.2 13.0 13.1 12.8*  HCT 43.8 42.4 39.7 41.0 40.4  MCV 90.3 91.6 88.6 89.5 90.2  PLT 182 173 136* 160 176   Cardiac Enzymes: No results for input(s): CKTOTAL, CKMB, CKMBINDEX, TROPONINI in the last 168 hours. BNP: BNP (last 3 results)  Recent Labs  08/19/14 2032  BNP 107.6*    ProBNP (last 3 results) No results for input(s): PROBNP in the last 8760 hours.  CBG: No results for input(s): GLUCAP in the last 168 hours.     SignedCristal Ford  [facility] 08/10/2015,  10:57 AM

## 2015-08-13 ENCOUNTER — Other Ambulatory Visit (HOSPITAL_COMMUNITY): Payer: Self-pay | Admitting: Internal Medicine

## 2015-08-13 DIAGNOSIS — IMO0002 Reserved for concepts with insufficient information to code with codable children: Secondary | ICD-10-CM

## 2015-08-16 DIAGNOSIS — I480 Paroxysmal atrial fibrillation: Secondary | ICD-10-CM | POA: Diagnosis not present

## 2015-08-16 DIAGNOSIS — R5381 Other malaise: Secondary | ICD-10-CM | POA: Diagnosis not present

## 2015-08-16 DIAGNOSIS — R2689 Other abnormalities of gait and mobility: Secondary | ICD-10-CM | POA: Diagnosis not present

## 2015-08-16 DIAGNOSIS — M545 Low back pain: Secondary | ICD-10-CM | POA: Diagnosis not present

## 2015-08-16 DIAGNOSIS — I10 Essential (primary) hypertension: Secondary | ICD-10-CM | POA: Diagnosis not present

## 2015-08-20 ENCOUNTER — Ambulatory Visit (HOSPITAL_COMMUNITY)
Admission: RE | Admit: 2015-08-20 | Discharge: 2015-08-20 | Disposition: A | Discharge status: Home / Self Care | Payer: Medicare Other | Source: Ambulatory Visit | Attending: Internal Medicine | Admitting: Internal Medicine

## 2015-08-20 DIAGNOSIS — W1830XA Fall on same level, unspecified, initial encounter: Secondary | ICD-10-CM | POA: Insufficient documentation

## 2015-08-20 DIAGNOSIS — S32019A Unspecified fracture of first lumbar vertebra, initial encounter for closed fracture: Secondary | ICD-10-CM | POA: Insufficient documentation

## 2015-08-20 DIAGNOSIS — M5144 Schmorl's nodes, thoracic region: Secondary | ICD-10-CM | POA: Insufficient documentation

## 2015-08-20 DIAGNOSIS — S22088A Other fracture of T11-T12 vertebra, initial encounter for closed fracture: Secondary | ICD-10-CM | POA: Diagnosis not present

## 2015-08-20 DIAGNOSIS — S22080A Wedge compression fracture of T11-T12 vertebra, initial encounter for closed fracture: Secondary | ICD-10-CM | POA: Diagnosis not present

## 2015-08-20 DIAGNOSIS — IMO0002 Reserved for concepts with insufficient information to code with codable children: Secondary | ICD-10-CM

## 2015-09-01 DIAGNOSIS — M5136 Other intervertebral disc degeneration, lumbar region: Secondary | ICD-10-CM | POA: Diagnosis not present

## 2015-09-01 DIAGNOSIS — Z452 Encounter for adjustment and management of vascular access device: Secondary | ICD-10-CM | POA: Diagnosis not present

## 2015-09-01 DIAGNOSIS — F0280 Dementia in other diseases classified elsewhere without behavioral disturbance: Secondary | ICD-10-CM | POA: Diagnosis not present

## 2015-09-01 DIAGNOSIS — R2689 Other abnormalities of gait and mobility: Secondary | ICD-10-CM | POA: Diagnosis not present

## 2015-09-01 DIAGNOSIS — I10 Essential (primary) hypertension: Secondary | ICD-10-CM | POA: Diagnosis not present

## 2015-09-01 DIAGNOSIS — C679 Malignant neoplasm of bladder, unspecified: Secondary | ICD-10-CM | POA: Diagnosis not present

## 2015-09-01 DIAGNOSIS — G309 Alzheimer's disease, unspecified: Secondary | ICD-10-CM | POA: Diagnosis not present

## 2015-09-01 DIAGNOSIS — Z8673 Personal history of transient ischemic attack (TIA), and cerebral infarction without residual deficits: Secondary | ICD-10-CM | POA: Diagnosis not present

## 2015-09-01 DIAGNOSIS — Z9181 History of falling: Secondary | ICD-10-CM | POA: Diagnosis not present

## 2015-09-01 DIAGNOSIS — I482 Chronic atrial fibrillation: Secondary | ICD-10-CM | POA: Diagnosis not present

## 2015-09-01 DIAGNOSIS — Z902 Acquired absence of lung [part of]: Secondary | ICD-10-CM | POA: Diagnosis not present

## 2015-09-01 DIAGNOSIS — Z7982 Long term (current) use of aspirin: Secondary | ICD-10-CM | POA: Diagnosis not present

## 2015-09-01 DIAGNOSIS — Z85118 Personal history of other malignant neoplasm of bronchus and lung: Secondary | ICD-10-CM | POA: Diagnosis not present

## 2015-09-01 DIAGNOSIS — F329 Major depressive disorder, single episode, unspecified: Secondary | ICD-10-CM | POA: Diagnosis not present

## 2015-09-01 DIAGNOSIS — G308 Other Alzheimer's disease: Secondary | ICD-10-CM | POA: Diagnosis not present

## 2015-09-01 DIAGNOSIS — Z87891 Personal history of nicotine dependence: Secondary | ICD-10-CM | POA: Diagnosis not present

## 2015-09-02 DIAGNOSIS — C679 Malignant neoplasm of bladder, unspecified: Secondary | ICD-10-CM | POA: Diagnosis not present

## 2015-09-02 DIAGNOSIS — G309 Alzheimer's disease, unspecified: Secondary | ICD-10-CM | POA: Diagnosis not present

## 2015-09-02 DIAGNOSIS — R2689 Other abnormalities of gait and mobility: Secondary | ICD-10-CM | POA: Diagnosis not present

## 2015-09-02 DIAGNOSIS — M5136 Other intervertebral disc degeneration, lumbar region: Secondary | ICD-10-CM | POA: Diagnosis not present

## 2015-09-02 DIAGNOSIS — I10 Essential (primary) hypertension: Secondary | ICD-10-CM | POA: Diagnosis not present

## 2015-09-02 DIAGNOSIS — F0280 Dementia in other diseases classified elsewhere without behavioral disturbance: Secondary | ICD-10-CM | POA: Diagnosis not present

## 2015-09-03 DIAGNOSIS — R2689 Other abnormalities of gait and mobility: Secondary | ICD-10-CM | POA: Diagnosis not present

## 2015-09-03 DIAGNOSIS — I10 Essential (primary) hypertension: Secondary | ICD-10-CM | POA: Diagnosis not present

## 2015-09-03 DIAGNOSIS — M5136 Other intervertebral disc degeneration, lumbar region: Secondary | ICD-10-CM | POA: Diagnosis not present

## 2015-09-03 DIAGNOSIS — C679 Malignant neoplasm of bladder, unspecified: Secondary | ICD-10-CM | POA: Diagnosis not present

## 2015-09-03 DIAGNOSIS — G309 Alzheimer's disease, unspecified: Secondary | ICD-10-CM | POA: Diagnosis not present

## 2015-09-03 DIAGNOSIS — F0280 Dementia in other diseases classified elsewhere without behavioral disturbance: Secondary | ICD-10-CM | POA: Diagnosis not present

## 2015-09-04 DIAGNOSIS — G309 Alzheimer's disease, unspecified: Secondary | ICD-10-CM | POA: Diagnosis not present

## 2015-09-04 DIAGNOSIS — R2689 Other abnormalities of gait and mobility: Secondary | ICD-10-CM | POA: Diagnosis not present

## 2015-09-04 DIAGNOSIS — F0280 Dementia in other diseases classified elsewhere without behavioral disturbance: Secondary | ICD-10-CM | POA: Diagnosis not present

## 2015-09-04 DIAGNOSIS — I10 Essential (primary) hypertension: Secondary | ICD-10-CM | POA: Diagnosis not present

## 2015-09-04 DIAGNOSIS — C679 Malignant neoplasm of bladder, unspecified: Secondary | ICD-10-CM | POA: Diagnosis not present

## 2015-09-04 DIAGNOSIS — M5136 Other intervertebral disc degeneration, lumbar region: Secondary | ICD-10-CM | POA: Diagnosis not present

## 2015-09-07 DIAGNOSIS — R2689 Other abnormalities of gait and mobility: Secondary | ICD-10-CM | POA: Diagnosis not present

## 2015-09-07 DIAGNOSIS — C679 Malignant neoplasm of bladder, unspecified: Secondary | ICD-10-CM | POA: Diagnosis not present

## 2015-09-07 DIAGNOSIS — G309 Alzheimer's disease, unspecified: Secondary | ICD-10-CM | POA: Diagnosis not present

## 2015-09-07 DIAGNOSIS — R3914 Feeling of incomplete bladder emptying: Secondary | ICD-10-CM | POA: Diagnosis not present

## 2015-09-07 DIAGNOSIS — I10 Essential (primary) hypertension: Secondary | ICD-10-CM | POA: Diagnosis not present

## 2015-09-07 DIAGNOSIS — N390 Urinary tract infection, site not specified: Secondary | ICD-10-CM | POA: Diagnosis not present

## 2015-09-07 DIAGNOSIS — C670 Malignant neoplasm of trigone of bladder: Secondary | ICD-10-CM | POA: Diagnosis not present

## 2015-09-07 DIAGNOSIS — F0280 Dementia in other diseases classified elsewhere without behavioral disturbance: Secondary | ICD-10-CM | POA: Diagnosis not present

## 2015-09-07 DIAGNOSIS — M5136 Other intervertebral disc degeneration, lumbar region: Secondary | ICD-10-CM | POA: Diagnosis not present

## 2015-09-08 DIAGNOSIS — C679 Malignant neoplasm of bladder, unspecified: Secondary | ICD-10-CM | POA: Diagnosis not present

## 2015-09-08 DIAGNOSIS — I10 Essential (primary) hypertension: Secondary | ICD-10-CM | POA: Diagnosis not present

## 2015-09-08 DIAGNOSIS — M5136 Other intervertebral disc degeneration, lumbar region: Secondary | ICD-10-CM | POA: Diagnosis not present

## 2015-09-08 DIAGNOSIS — F0280 Dementia in other diseases classified elsewhere without behavioral disturbance: Secondary | ICD-10-CM | POA: Diagnosis not present

## 2015-09-08 DIAGNOSIS — G309 Alzheimer's disease, unspecified: Secondary | ICD-10-CM | POA: Diagnosis not present

## 2015-09-08 DIAGNOSIS — R2689 Other abnormalities of gait and mobility: Secondary | ICD-10-CM | POA: Diagnosis not present

## 2015-09-09 DIAGNOSIS — F0280 Dementia in other diseases classified elsewhere without behavioral disturbance: Secondary | ICD-10-CM | POA: Diagnosis not present

## 2015-09-09 DIAGNOSIS — C679 Malignant neoplasm of bladder, unspecified: Secondary | ICD-10-CM | POA: Diagnosis not present

## 2015-09-09 DIAGNOSIS — G309 Alzheimer's disease, unspecified: Secondary | ICD-10-CM | POA: Diagnosis not present

## 2015-09-09 DIAGNOSIS — R2689 Other abnormalities of gait and mobility: Secondary | ICD-10-CM | POA: Diagnosis not present

## 2015-09-09 DIAGNOSIS — M5136 Other intervertebral disc degeneration, lumbar region: Secondary | ICD-10-CM | POA: Diagnosis not present

## 2015-09-09 DIAGNOSIS — I10 Essential (primary) hypertension: Secondary | ICD-10-CM | POA: Diagnosis not present

## 2015-09-10 DIAGNOSIS — R2689 Other abnormalities of gait and mobility: Secondary | ICD-10-CM | POA: Diagnosis not present

## 2015-09-10 DIAGNOSIS — F0280 Dementia in other diseases classified elsewhere without behavioral disturbance: Secondary | ICD-10-CM | POA: Diagnosis not present

## 2015-09-10 DIAGNOSIS — M5136 Other intervertebral disc degeneration, lumbar region: Secondary | ICD-10-CM | POA: Diagnosis not present

## 2015-09-10 DIAGNOSIS — G309 Alzheimer's disease, unspecified: Secondary | ICD-10-CM | POA: Diagnosis not present

## 2015-09-10 DIAGNOSIS — C679 Malignant neoplasm of bladder, unspecified: Secondary | ICD-10-CM | POA: Diagnosis not present

## 2015-09-10 DIAGNOSIS — I10 Essential (primary) hypertension: Secondary | ICD-10-CM | POA: Diagnosis not present

## 2015-09-11 DIAGNOSIS — C679 Malignant neoplasm of bladder, unspecified: Secondary | ICD-10-CM | POA: Diagnosis not present

## 2015-09-11 DIAGNOSIS — M5136 Other intervertebral disc degeneration, lumbar region: Secondary | ICD-10-CM | POA: Diagnosis not present

## 2015-09-11 DIAGNOSIS — I10 Essential (primary) hypertension: Secondary | ICD-10-CM | POA: Diagnosis not present

## 2015-09-11 DIAGNOSIS — G309 Alzheimer's disease, unspecified: Secondary | ICD-10-CM | POA: Diagnosis not present

## 2015-09-11 DIAGNOSIS — R2689 Other abnormalities of gait and mobility: Secondary | ICD-10-CM | POA: Diagnosis not present

## 2015-09-11 DIAGNOSIS — F0280 Dementia in other diseases classified elsewhere without behavioral disturbance: Secondary | ICD-10-CM | POA: Diagnosis not present

## 2015-09-14 DIAGNOSIS — M5136 Other intervertebral disc degeneration, lumbar region: Secondary | ICD-10-CM | POA: Diagnosis not present

## 2015-09-14 DIAGNOSIS — R2689 Other abnormalities of gait and mobility: Secondary | ICD-10-CM | POA: Diagnosis not present

## 2015-09-14 DIAGNOSIS — I10 Essential (primary) hypertension: Secondary | ICD-10-CM | POA: Diagnosis not present

## 2015-09-14 DIAGNOSIS — G309 Alzheimer's disease, unspecified: Secondary | ICD-10-CM | POA: Diagnosis not present

## 2015-09-14 DIAGNOSIS — F0280 Dementia in other diseases classified elsewhere without behavioral disturbance: Secondary | ICD-10-CM | POA: Diagnosis not present

## 2015-09-14 DIAGNOSIS — C679 Malignant neoplasm of bladder, unspecified: Secondary | ICD-10-CM | POA: Diagnosis not present

## 2015-09-15 DIAGNOSIS — R2689 Other abnormalities of gait and mobility: Secondary | ICD-10-CM | POA: Diagnosis not present

## 2015-09-15 DIAGNOSIS — C679 Malignant neoplasm of bladder, unspecified: Secondary | ICD-10-CM | POA: Diagnosis not present

## 2015-09-15 DIAGNOSIS — I10 Essential (primary) hypertension: Secondary | ICD-10-CM | POA: Diagnosis not present

## 2015-09-15 DIAGNOSIS — M5136 Other intervertebral disc degeneration, lumbar region: Secondary | ICD-10-CM | POA: Diagnosis not present

## 2015-09-15 DIAGNOSIS — G309 Alzheimer's disease, unspecified: Secondary | ICD-10-CM | POA: Diagnosis not present

## 2015-09-15 DIAGNOSIS — F0280 Dementia in other diseases classified elsewhere without behavioral disturbance: Secondary | ICD-10-CM | POA: Diagnosis not present

## 2015-09-16 DIAGNOSIS — G309 Alzheimer's disease, unspecified: Secondary | ICD-10-CM | POA: Diagnosis not present

## 2015-09-16 DIAGNOSIS — R2689 Other abnormalities of gait and mobility: Secondary | ICD-10-CM | POA: Diagnosis not present

## 2015-09-16 DIAGNOSIS — I10 Essential (primary) hypertension: Secondary | ICD-10-CM | POA: Diagnosis not present

## 2015-09-16 DIAGNOSIS — F0280 Dementia in other diseases classified elsewhere without behavioral disturbance: Secondary | ICD-10-CM | POA: Diagnosis not present

## 2015-09-16 DIAGNOSIS — M5136 Other intervertebral disc degeneration, lumbar region: Secondary | ICD-10-CM | POA: Diagnosis not present

## 2015-09-16 DIAGNOSIS — C679 Malignant neoplasm of bladder, unspecified: Secondary | ICD-10-CM | POA: Diagnosis not present

## 2015-09-18 DIAGNOSIS — C679 Malignant neoplasm of bladder, unspecified: Secondary | ICD-10-CM | POA: Diagnosis not present

## 2015-09-18 DIAGNOSIS — F0280 Dementia in other diseases classified elsewhere without behavioral disturbance: Secondary | ICD-10-CM | POA: Diagnosis not present

## 2015-09-18 DIAGNOSIS — M5136 Other intervertebral disc degeneration, lumbar region: Secondary | ICD-10-CM | POA: Diagnosis not present

## 2015-09-18 DIAGNOSIS — R2689 Other abnormalities of gait and mobility: Secondary | ICD-10-CM | POA: Diagnosis not present

## 2015-09-18 DIAGNOSIS — G309 Alzheimer's disease, unspecified: Secondary | ICD-10-CM | POA: Diagnosis not present

## 2015-09-18 DIAGNOSIS — I10 Essential (primary) hypertension: Secondary | ICD-10-CM | POA: Diagnosis not present

## 2015-09-21 DIAGNOSIS — I10 Essential (primary) hypertension: Secondary | ICD-10-CM | POA: Diagnosis not present

## 2015-09-21 DIAGNOSIS — G309 Alzheimer's disease, unspecified: Secondary | ICD-10-CM | POA: Diagnosis not present

## 2015-09-21 DIAGNOSIS — R2689 Other abnormalities of gait and mobility: Secondary | ICD-10-CM | POA: Diagnosis not present

## 2015-09-21 DIAGNOSIS — C679 Malignant neoplasm of bladder, unspecified: Secondary | ICD-10-CM | POA: Diagnosis not present

## 2015-09-21 DIAGNOSIS — F0280 Dementia in other diseases classified elsewhere without behavioral disturbance: Secondary | ICD-10-CM | POA: Diagnosis not present

## 2015-09-21 DIAGNOSIS — M5136 Other intervertebral disc degeneration, lumbar region: Secondary | ICD-10-CM | POA: Diagnosis not present

## 2015-09-22 DIAGNOSIS — G309 Alzheimer's disease, unspecified: Secondary | ICD-10-CM | POA: Diagnosis not present

## 2015-09-22 DIAGNOSIS — C679 Malignant neoplasm of bladder, unspecified: Secondary | ICD-10-CM | POA: Diagnosis not present

## 2015-09-22 DIAGNOSIS — F0280 Dementia in other diseases classified elsewhere without behavioral disturbance: Secondary | ICD-10-CM | POA: Diagnosis not present

## 2015-09-22 DIAGNOSIS — R2689 Other abnormalities of gait and mobility: Secondary | ICD-10-CM | POA: Diagnosis not present

## 2015-09-22 DIAGNOSIS — M5136 Other intervertebral disc degeneration, lumbar region: Secondary | ICD-10-CM | POA: Diagnosis not present

## 2015-09-22 DIAGNOSIS — I10 Essential (primary) hypertension: Secondary | ICD-10-CM | POA: Diagnosis not present

## 2015-09-23 DIAGNOSIS — I10 Essential (primary) hypertension: Secondary | ICD-10-CM | POA: Diagnosis not present

## 2015-09-23 DIAGNOSIS — M5136 Other intervertebral disc degeneration, lumbar region: Secondary | ICD-10-CM | POA: Diagnosis not present

## 2015-09-23 DIAGNOSIS — R2689 Other abnormalities of gait and mobility: Secondary | ICD-10-CM | POA: Diagnosis not present

## 2015-09-23 DIAGNOSIS — G309 Alzheimer's disease, unspecified: Secondary | ICD-10-CM | POA: Diagnosis not present

## 2015-09-23 DIAGNOSIS — F0280 Dementia in other diseases classified elsewhere without behavioral disturbance: Secondary | ICD-10-CM | POA: Diagnosis not present

## 2015-09-23 DIAGNOSIS — C679 Malignant neoplasm of bladder, unspecified: Secondary | ICD-10-CM | POA: Diagnosis not present

## 2015-09-25 DIAGNOSIS — C679 Malignant neoplasm of bladder, unspecified: Secondary | ICD-10-CM | POA: Diagnosis not present

## 2015-09-25 DIAGNOSIS — R2689 Other abnormalities of gait and mobility: Secondary | ICD-10-CM | POA: Diagnosis not present

## 2015-09-25 DIAGNOSIS — I10 Essential (primary) hypertension: Secondary | ICD-10-CM | POA: Diagnosis not present

## 2015-09-25 DIAGNOSIS — G309 Alzheimer's disease, unspecified: Secondary | ICD-10-CM | POA: Diagnosis not present

## 2015-09-25 DIAGNOSIS — M5136 Other intervertebral disc degeneration, lumbar region: Secondary | ICD-10-CM | POA: Diagnosis not present

## 2015-09-25 DIAGNOSIS — F0280 Dementia in other diseases classified elsewhere without behavioral disturbance: Secondary | ICD-10-CM | POA: Diagnosis not present

## 2015-09-29 DIAGNOSIS — R2689 Other abnormalities of gait and mobility: Secondary | ICD-10-CM | POA: Diagnosis not present

## 2015-09-29 DIAGNOSIS — F0280 Dementia in other diseases classified elsewhere without behavioral disturbance: Secondary | ICD-10-CM | POA: Diagnosis not present

## 2015-09-29 DIAGNOSIS — C679 Malignant neoplasm of bladder, unspecified: Secondary | ICD-10-CM | POA: Diagnosis not present

## 2015-09-29 DIAGNOSIS — G309 Alzheimer's disease, unspecified: Secondary | ICD-10-CM | POA: Diagnosis not present

## 2015-09-29 DIAGNOSIS — I10 Essential (primary) hypertension: Secondary | ICD-10-CM | POA: Diagnosis not present

## 2015-09-29 DIAGNOSIS — M5136 Other intervertebral disc degeneration, lumbar region: Secondary | ICD-10-CM | POA: Diagnosis not present

## 2015-09-30 DIAGNOSIS — N183 Chronic kidney disease, stage 3 (moderate): Secondary | ICD-10-CM | POA: Diagnosis not present

## 2015-09-30 DIAGNOSIS — N390 Urinary tract infection, site not specified: Secondary | ICD-10-CM | POA: Diagnosis not present

## 2015-09-30 DIAGNOSIS — R296 Repeated falls: Secondary | ICD-10-CM | POA: Diagnosis not present

## 2015-09-30 DIAGNOSIS — Z6825 Body mass index (BMI) 25.0-25.9, adult: Secondary | ICD-10-CM | POA: Diagnosis not present

## 2015-10-01 DIAGNOSIS — I10 Essential (primary) hypertension: Secondary | ICD-10-CM | POA: Diagnosis not present

## 2015-10-01 DIAGNOSIS — G309 Alzheimer's disease, unspecified: Secondary | ICD-10-CM | POA: Diagnosis not present

## 2015-10-01 DIAGNOSIS — M5136 Other intervertebral disc degeneration, lumbar region: Secondary | ICD-10-CM | POA: Diagnosis not present

## 2015-10-01 DIAGNOSIS — C679 Malignant neoplasm of bladder, unspecified: Secondary | ICD-10-CM | POA: Diagnosis not present

## 2015-10-01 DIAGNOSIS — F0280 Dementia in other diseases classified elsewhere without behavioral disturbance: Secondary | ICD-10-CM | POA: Diagnosis not present

## 2015-10-01 DIAGNOSIS — R2689 Other abnormalities of gait and mobility: Secondary | ICD-10-CM | POA: Diagnosis not present

## 2015-10-06 DIAGNOSIS — R2689 Other abnormalities of gait and mobility: Secondary | ICD-10-CM | POA: Diagnosis not present

## 2015-10-06 DIAGNOSIS — C679 Malignant neoplasm of bladder, unspecified: Secondary | ICD-10-CM | POA: Diagnosis not present

## 2015-10-06 DIAGNOSIS — M5136 Other intervertebral disc degeneration, lumbar region: Secondary | ICD-10-CM | POA: Diagnosis not present

## 2015-10-06 DIAGNOSIS — I10 Essential (primary) hypertension: Secondary | ICD-10-CM | POA: Diagnosis not present

## 2015-10-06 DIAGNOSIS — G309 Alzheimer's disease, unspecified: Secondary | ICD-10-CM | POA: Diagnosis not present

## 2015-10-06 DIAGNOSIS — F0280 Dementia in other diseases classified elsewhere without behavioral disturbance: Secondary | ICD-10-CM | POA: Diagnosis not present

## 2015-10-07 DIAGNOSIS — F0280 Dementia in other diseases classified elsewhere without behavioral disturbance: Secondary | ICD-10-CM | POA: Diagnosis not present

## 2015-10-07 DIAGNOSIS — N390 Urinary tract infection, site not specified: Secondary | ICD-10-CM | POA: Diagnosis not present

## 2015-10-07 DIAGNOSIS — R7301 Impaired fasting glucose: Secondary | ICD-10-CM | POA: Diagnosis not present

## 2015-10-07 DIAGNOSIS — R296 Repeated falls: Secondary | ICD-10-CM | POA: Diagnosis not present

## 2015-10-07 DIAGNOSIS — R627 Adult failure to thrive: Secondary | ICD-10-CM | POA: Diagnosis not present

## 2015-10-07 DIAGNOSIS — Z23 Encounter for immunization: Secondary | ICD-10-CM | POA: Diagnosis not present

## 2015-10-07 DIAGNOSIS — Z6826 Body mass index (BMI) 26.0-26.9, adult: Secondary | ICD-10-CM | POA: Diagnosis not present

## 2015-10-08 DIAGNOSIS — C679 Malignant neoplasm of bladder, unspecified: Secondary | ICD-10-CM | POA: Diagnosis not present

## 2015-10-08 DIAGNOSIS — G309 Alzheimer's disease, unspecified: Secondary | ICD-10-CM | POA: Diagnosis not present

## 2015-10-08 DIAGNOSIS — R2689 Other abnormalities of gait and mobility: Secondary | ICD-10-CM | POA: Diagnosis not present

## 2015-10-08 DIAGNOSIS — F0280 Dementia in other diseases classified elsewhere without behavioral disturbance: Secondary | ICD-10-CM | POA: Diagnosis not present

## 2015-10-08 DIAGNOSIS — M5136 Other intervertebral disc degeneration, lumbar region: Secondary | ICD-10-CM | POA: Diagnosis not present

## 2015-10-08 DIAGNOSIS — I10 Essential (primary) hypertension: Secondary | ICD-10-CM | POA: Diagnosis not present

## 2015-10-12 DIAGNOSIS — R2689 Other abnormalities of gait and mobility: Secondary | ICD-10-CM | POA: Diagnosis not present

## 2015-10-12 DIAGNOSIS — I10 Essential (primary) hypertension: Secondary | ICD-10-CM | POA: Diagnosis not present

## 2015-10-12 DIAGNOSIS — F0280 Dementia in other diseases classified elsewhere without behavioral disturbance: Secondary | ICD-10-CM | POA: Diagnosis not present

## 2015-10-12 DIAGNOSIS — G309 Alzheimer's disease, unspecified: Secondary | ICD-10-CM | POA: Diagnosis not present

## 2015-10-12 DIAGNOSIS — M5136 Other intervertebral disc degeneration, lumbar region: Secondary | ICD-10-CM | POA: Diagnosis not present

## 2015-10-12 DIAGNOSIS — C679 Malignant neoplasm of bladder, unspecified: Secondary | ICD-10-CM | POA: Diagnosis not present

## 2015-10-14 DIAGNOSIS — R531 Weakness: Secondary | ICD-10-CM | POA: Diagnosis not present

## 2015-10-15 DIAGNOSIS — C679 Malignant neoplasm of bladder, unspecified: Secondary | ICD-10-CM | POA: Diagnosis not present

## 2015-10-15 DIAGNOSIS — R2689 Other abnormalities of gait and mobility: Secondary | ICD-10-CM | POA: Diagnosis not present

## 2015-10-15 DIAGNOSIS — G309 Alzheimer's disease, unspecified: Secondary | ICD-10-CM | POA: Diagnosis not present

## 2015-10-15 DIAGNOSIS — M5136 Other intervertebral disc degeneration, lumbar region: Secondary | ICD-10-CM | POA: Diagnosis not present

## 2015-10-15 DIAGNOSIS — F0280 Dementia in other diseases classified elsewhere without behavioral disturbance: Secondary | ICD-10-CM | POA: Diagnosis not present

## 2015-10-15 DIAGNOSIS — I10 Essential (primary) hypertension: Secondary | ICD-10-CM | POA: Diagnosis not present

## 2015-10-16 DIAGNOSIS — N1330 Unspecified hydronephrosis: Secondary | ICD-10-CM | POA: Diagnosis not present

## 2015-10-16 DIAGNOSIS — C670 Malignant neoplasm of trigone of bladder: Secondary | ICD-10-CM | POA: Diagnosis not present

## 2015-10-16 DIAGNOSIS — R3914 Feeling of incomplete bladder emptying: Secondary | ICD-10-CM | POA: Diagnosis not present

## 2015-10-19 DIAGNOSIS — M5136 Other intervertebral disc degeneration, lumbar region: Secondary | ICD-10-CM | POA: Diagnosis not present

## 2015-10-19 DIAGNOSIS — I10 Essential (primary) hypertension: Secondary | ICD-10-CM | POA: Diagnosis not present

## 2015-10-19 DIAGNOSIS — F0280 Dementia in other diseases classified elsewhere without behavioral disturbance: Secondary | ICD-10-CM | POA: Diagnosis not present

## 2015-10-19 DIAGNOSIS — R2689 Other abnormalities of gait and mobility: Secondary | ICD-10-CM | POA: Diagnosis not present

## 2015-10-19 DIAGNOSIS — G309 Alzheimer's disease, unspecified: Secondary | ICD-10-CM | POA: Diagnosis not present

## 2015-10-19 DIAGNOSIS — C679 Malignant neoplasm of bladder, unspecified: Secondary | ICD-10-CM | POA: Diagnosis not present

## 2015-10-22 DIAGNOSIS — C679 Malignant neoplasm of bladder, unspecified: Secondary | ICD-10-CM | POA: Diagnosis not present

## 2015-10-22 DIAGNOSIS — M5136 Other intervertebral disc degeneration, lumbar region: Secondary | ICD-10-CM | POA: Diagnosis not present

## 2015-10-22 DIAGNOSIS — G309 Alzheimer's disease, unspecified: Secondary | ICD-10-CM | POA: Diagnosis not present

## 2015-10-22 DIAGNOSIS — R2689 Other abnormalities of gait and mobility: Secondary | ICD-10-CM | POA: Diagnosis not present

## 2015-10-22 DIAGNOSIS — F0280 Dementia in other diseases classified elsewhere without behavioral disturbance: Secondary | ICD-10-CM | POA: Diagnosis not present

## 2015-10-22 DIAGNOSIS — I10 Essential (primary) hypertension: Secondary | ICD-10-CM | POA: Diagnosis not present

## 2015-10-23 DIAGNOSIS — F0280 Dementia in other diseases classified elsewhere without behavioral disturbance: Secondary | ICD-10-CM | POA: Diagnosis not present

## 2015-10-23 DIAGNOSIS — C679 Malignant neoplasm of bladder, unspecified: Secondary | ICD-10-CM | POA: Diagnosis not present

## 2015-10-23 DIAGNOSIS — I10 Essential (primary) hypertension: Secondary | ICD-10-CM | POA: Diagnosis not present

## 2015-10-23 DIAGNOSIS — M5136 Other intervertebral disc degeneration, lumbar region: Secondary | ICD-10-CM | POA: Diagnosis not present

## 2015-10-23 DIAGNOSIS — G309 Alzheimer's disease, unspecified: Secondary | ICD-10-CM | POA: Diagnosis not present

## 2015-10-23 DIAGNOSIS — R2689 Other abnormalities of gait and mobility: Secondary | ICD-10-CM | POA: Diagnosis not present

## 2015-10-28 DIAGNOSIS — I10 Essential (primary) hypertension: Secondary | ICD-10-CM | POA: Diagnosis not present

## 2015-10-28 DIAGNOSIS — R2689 Other abnormalities of gait and mobility: Secondary | ICD-10-CM | POA: Diagnosis not present

## 2015-10-28 DIAGNOSIS — C679 Malignant neoplasm of bladder, unspecified: Secondary | ICD-10-CM | POA: Diagnosis not present

## 2015-10-28 DIAGNOSIS — F0280 Dementia in other diseases classified elsewhere without behavioral disturbance: Secondary | ICD-10-CM | POA: Diagnosis not present

## 2015-10-28 DIAGNOSIS — G309 Alzheimer's disease, unspecified: Secondary | ICD-10-CM | POA: Diagnosis not present

## 2015-10-28 DIAGNOSIS — M5136 Other intervertebral disc degeneration, lumbar region: Secondary | ICD-10-CM | POA: Diagnosis not present

## 2015-10-29 DIAGNOSIS — C679 Malignant neoplasm of bladder, unspecified: Secondary | ICD-10-CM | POA: Diagnosis not present

## 2015-10-29 DIAGNOSIS — I10 Essential (primary) hypertension: Secondary | ICD-10-CM | POA: Diagnosis not present

## 2015-10-29 DIAGNOSIS — G309 Alzheimer's disease, unspecified: Secondary | ICD-10-CM | POA: Diagnosis not present

## 2015-10-29 DIAGNOSIS — R2689 Other abnormalities of gait and mobility: Secondary | ICD-10-CM | POA: Diagnosis not present

## 2015-10-29 DIAGNOSIS — M5136 Other intervertebral disc degeneration, lumbar region: Secondary | ICD-10-CM | POA: Diagnosis not present

## 2015-10-29 DIAGNOSIS — F0280 Dementia in other diseases classified elsewhere without behavioral disturbance: Secondary | ICD-10-CM | POA: Diagnosis not present

## 2015-10-31 DIAGNOSIS — C679 Malignant neoplasm of bladder, unspecified: Secondary | ICD-10-CM | POA: Diagnosis not present

## 2015-10-31 DIAGNOSIS — I10 Essential (primary) hypertension: Secondary | ICD-10-CM | POA: Diagnosis not present

## 2015-10-31 DIAGNOSIS — I482 Chronic atrial fibrillation: Secondary | ICD-10-CM | POA: Diagnosis not present

## 2015-10-31 DIAGNOSIS — Z8673 Personal history of transient ischemic attack (TIA), and cerebral infarction without residual deficits: Secondary | ICD-10-CM | POA: Diagnosis not present

## 2015-10-31 DIAGNOSIS — Z7982 Long term (current) use of aspirin: Secondary | ICD-10-CM | POA: Diagnosis not present

## 2015-10-31 DIAGNOSIS — F0280 Dementia in other diseases classified elsewhere without behavioral disturbance: Secondary | ICD-10-CM | POA: Diagnosis not present

## 2015-10-31 DIAGNOSIS — Z9181 History of falling: Secondary | ICD-10-CM | POA: Diagnosis not present

## 2015-10-31 DIAGNOSIS — M6281 Muscle weakness (generalized): Secondary | ICD-10-CM | POA: Diagnosis not present

## 2015-10-31 DIAGNOSIS — G308 Other Alzheimer's disease: Secondary | ICD-10-CM | POA: Diagnosis not present

## 2015-10-31 DIAGNOSIS — M5136 Other intervertebral disc degeneration, lumbar region: Secondary | ICD-10-CM | POA: Diagnosis not present

## 2015-10-31 DIAGNOSIS — F329 Major depressive disorder, single episode, unspecified: Secondary | ICD-10-CM | POA: Diagnosis not present

## 2015-10-31 DIAGNOSIS — Z85118 Personal history of other malignant neoplasm of bronchus and lung: Secondary | ICD-10-CM | POA: Diagnosis not present

## 2015-10-31 DIAGNOSIS — G309 Alzheimer's disease, unspecified: Secondary | ICD-10-CM | POA: Diagnosis not present

## 2015-10-31 DIAGNOSIS — Z902 Acquired absence of lung [part of]: Secondary | ICD-10-CM | POA: Diagnosis not present

## 2015-10-31 DIAGNOSIS — Z87891 Personal history of nicotine dependence: Secondary | ICD-10-CM | POA: Diagnosis not present

## 2015-11-03 DIAGNOSIS — I10 Essential (primary) hypertension: Secondary | ICD-10-CM | POA: Diagnosis not present

## 2015-11-03 DIAGNOSIS — F0280 Dementia in other diseases classified elsewhere without behavioral disturbance: Secondary | ICD-10-CM | POA: Diagnosis not present

## 2015-11-03 DIAGNOSIS — M6281 Muscle weakness (generalized): Secondary | ICD-10-CM | POA: Diagnosis not present

## 2015-11-03 DIAGNOSIS — C679 Malignant neoplasm of bladder, unspecified: Secondary | ICD-10-CM | POA: Diagnosis not present

## 2015-11-03 DIAGNOSIS — G309 Alzheimer's disease, unspecified: Secondary | ICD-10-CM | POA: Diagnosis not present

## 2015-11-03 DIAGNOSIS — M5136 Other intervertebral disc degeneration, lumbar region: Secondary | ICD-10-CM | POA: Diagnosis not present

## 2015-11-05 DIAGNOSIS — M5136 Other intervertebral disc degeneration, lumbar region: Secondary | ICD-10-CM | POA: Diagnosis not present

## 2015-11-05 DIAGNOSIS — I10 Essential (primary) hypertension: Secondary | ICD-10-CM | POA: Diagnosis not present

## 2015-11-05 DIAGNOSIS — G309 Alzheimer's disease, unspecified: Secondary | ICD-10-CM | POA: Diagnosis not present

## 2015-11-05 DIAGNOSIS — M6281 Muscle weakness (generalized): Secondary | ICD-10-CM | POA: Diagnosis not present

## 2015-11-05 DIAGNOSIS — F0280 Dementia in other diseases classified elsewhere without behavioral disturbance: Secondary | ICD-10-CM | POA: Diagnosis not present

## 2015-11-05 DIAGNOSIS — C679 Malignant neoplasm of bladder, unspecified: Secondary | ICD-10-CM | POA: Diagnosis not present

## 2015-11-10 DIAGNOSIS — M6281 Muscle weakness (generalized): Secondary | ICD-10-CM | POA: Diagnosis not present

## 2015-11-10 DIAGNOSIS — G309 Alzheimer's disease, unspecified: Secondary | ICD-10-CM | POA: Diagnosis not present

## 2015-11-10 DIAGNOSIS — C679 Malignant neoplasm of bladder, unspecified: Secondary | ICD-10-CM | POA: Diagnosis not present

## 2015-11-10 DIAGNOSIS — F0280 Dementia in other diseases classified elsewhere without behavioral disturbance: Secondary | ICD-10-CM | POA: Diagnosis not present

## 2015-11-10 DIAGNOSIS — I10 Essential (primary) hypertension: Secondary | ICD-10-CM | POA: Diagnosis not present

## 2015-11-10 DIAGNOSIS — M5136 Other intervertebral disc degeneration, lumbar region: Secondary | ICD-10-CM | POA: Diagnosis not present

## 2015-11-12 DIAGNOSIS — M5136 Other intervertebral disc degeneration, lumbar region: Secondary | ICD-10-CM | POA: Diagnosis not present

## 2015-11-12 DIAGNOSIS — G309 Alzheimer's disease, unspecified: Secondary | ICD-10-CM | POA: Diagnosis not present

## 2015-11-12 DIAGNOSIS — I10 Essential (primary) hypertension: Secondary | ICD-10-CM | POA: Diagnosis not present

## 2015-11-12 DIAGNOSIS — F0280 Dementia in other diseases classified elsewhere without behavioral disturbance: Secondary | ICD-10-CM | POA: Diagnosis not present

## 2015-11-12 DIAGNOSIS — C679 Malignant neoplasm of bladder, unspecified: Secondary | ICD-10-CM | POA: Diagnosis not present

## 2015-11-12 DIAGNOSIS — M6281 Muscle weakness (generalized): Secondary | ICD-10-CM | POA: Diagnosis not present

## 2015-11-16 DIAGNOSIS — N261 Atrophy of kidney (terminal): Secondary | ICD-10-CM | POA: Diagnosis not present

## 2015-11-16 DIAGNOSIS — R8299 Other abnormal findings in urine: Secondary | ICD-10-CM | POA: Diagnosis not present

## 2015-11-16 DIAGNOSIS — Z8551 Personal history of malignant neoplasm of bladder: Secondary | ICD-10-CM | POA: Diagnosis not present

## 2015-11-16 DIAGNOSIS — N1330 Unspecified hydronephrosis: Secondary | ICD-10-CM | POA: Diagnosis not present

## 2015-11-17 DIAGNOSIS — M6281 Muscle weakness (generalized): Secondary | ICD-10-CM | POA: Diagnosis not present

## 2015-11-17 DIAGNOSIS — M5136 Other intervertebral disc degeneration, lumbar region: Secondary | ICD-10-CM | POA: Diagnosis not present

## 2015-11-17 DIAGNOSIS — I10 Essential (primary) hypertension: Secondary | ICD-10-CM | POA: Diagnosis not present

## 2015-11-17 DIAGNOSIS — F0280 Dementia in other diseases classified elsewhere without behavioral disturbance: Secondary | ICD-10-CM | POA: Diagnosis not present

## 2015-11-17 DIAGNOSIS — C679 Malignant neoplasm of bladder, unspecified: Secondary | ICD-10-CM | POA: Diagnosis not present

## 2015-11-17 DIAGNOSIS — G309 Alzheimer's disease, unspecified: Secondary | ICD-10-CM | POA: Diagnosis not present

## 2015-11-19 DIAGNOSIS — F0280 Dementia in other diseases classified elsewhere without behavioral disturbance: Secondary | ICD-10-CM | POA: Diagnosis not present

## 2015-11-19 DIAGNOSIS — M6281 Muscle weakness (generalized): Secondary | ICD-10-CM | POA: Diagnosis not present

## 2015-11-19 DIAGNOSIS — M5136 Other intervertebral disc degeneration, lumbar region: Secondary | ICD-10-CM | POA: Diagnosis not present

## 2015-11-19 DIAGNOSIS — I10 Essential (primary) hypertension: Secondary | ICD-10-CM | POA: Diagnosis not present

## 2015-11-19 DIAGNOSIS — C679 Malignant neoplasm of bladder, unspecified: Secondary | ICD-10-CM | POA: Diagnosis not present

## 2015-11-19 DIAGNOSIS — G309 Alzheimer's disease, unspecified: Secondary | ICD-10-CM | POA: Diagnosis not present

## 2015-11-25 DIAGNOSIS — F0280 Dementia in other diseases classified elsewhere without behavioral disturbance: Secondary | ICD-10-CM | POA: Diagnosis not present

## 2015-11-25 DIAGNOSIS — M5136 Other intervertebral disc degeneration, lumbar region: Secondary | ICD-10-CM | POA: Diagnosis not present

## 2015-11-25 DIAGNOSIS — G309 Alzheimer's disease, unspecified: Secondary | ICD-10-CM | POA: Diagnosis not present

## 2015-11-25 DIAGNOSIS — I10 Essential (primary) hypertension: Secondary | ICD-10-CM | POA: Diagnosis not present

## 2015-11-25 DIAGNOSIS — C679 Malignant neoplasm of bladder, unspecified: Secondary | ICD-10-CM | POA: Diagnosis not present

## 2015-11-25 DIAGNOSIS — M6281 Muscle weakness (generalized): Secondary | ICD-10-CM | POA: Diagnosis not present

## 2015-11-26 DIAGNOSIS — F0280 Dementia in other diseases classified elsewhere without behavioral disturbance: Secondary | ICD-10-CM | POA: Diagnosis not present

## 2015-11-26 DIAGNOSIS — M5136 Other intervertebral disc degeneration, lumbar region: Secondary | ICD-10-CM | POA: Diagnosis not present

## 2015-11-26 DIAGNOSIS — C679 Malignant neoplasm of bladder, unspecified: Secondary | ICD-10-CM | POA: Diagnosis not present

## 2015-11-26 DIAGNOSIS — I10 Essential (primary) hypertension: Secondary | ICD-10-CM | POA: Diagnosis not present

## 2015-11-26 DIAGNOSIS — G309 Alzheimer's disease, unspecified: Secondary | ICD-10-CM | POA: Diagnosis not present

## 2015-11-26 DIAGNOSIS — M6281 Muscle weakness (generalized): Secondary | ICD-10-CM | POA: Diagnosis not present

## 2015-12-01 DIAGNOSIS — M5136 Other intervertebral disc degeneration, lumbar region: Secondary | ICD-10-CM | POA: Diagnosis not present

## 2015-12-01 DIAGNOSIS — G309 Alzheimer's disease, unspecified: Secondary | ICD-10-CM | POA: Diagnosis not present

## 2015-12-01 DIAGNOSIS — M6281 Muscle weakness (generalized): Secondary | ICD-10-CM | POA: Diagnosis not present

## 2015-12-01 DIAGNOSIS — C679 Malignant neoplasm of bladder, unspecified: Secondary | ICD-10-CM | POA: Diagnosis not present

## 2015-12-01 DIAGNOSIS — F0280 Dementia in other diseases classified elsewhere without behavioral disturbance: Secondary | ICD-10-CM | POA: Diagnosis not present

## 2015-12-01 DIAGNOSIS — I10 Essential (primary) hypertension: Secondary | ICD-10-CM | POA: Diagnosis not present

## 2015-12-03 DIAGNOSIS — C679 Malignant neoplasm of bladder, unspecified: Secondary | ICD-10-CM | POA: Diagnosis not present

## 2015-12-03 DIAGNOSIS — I10 Essential (primary) hypertension: Secondary | ICD-10-CM | POA: Diagnosis not present

## 2015-12-03 DIAGNOSIS — M6281 Muscle weakness (generalized): Secondary | ICD-10-CM | POA: Diagnosis not present

## 2015-12-03 DIAGNOSIS — F0280 Dementia in other diseases classified elsewhere without behavioral disturbance: Secondary | ICD-10-CM | POA: Diagnosis not present

## 2015-12-03 DIAGNOSIS — G309 Alzheimer's disease, unspecified: Secondary | ICD-10-CM | POA: Diagnosis not present

## 2015-12-03 DIAGNOSIS — M5136 Other intervertebral disc degeneration, lumbar region: Secondary | ICD-10-CM | POA: Diagnosis not present

## 2015-12-09 DIAGNOSIS — F0280 Dementia in other diseases classified elsewhere without behavioral disturbance: Secondary | ICD-10-CM | POA: Diagnosis not present

## 2015-12-09 DIAGNOSIS — I10 Essential (primary) hypertension: Secondary | ICD-10-CM | POA: Diagnosis not present

## 2015-12-09 DIAGNOSIS — M5136 Other intervertebral disc degeneration, lumbar region: Secondary | ICD-10-CM | POA: Diagnosis not present

## 2015-12-09 DIAGNOSIS — C679 Malignant neoplasm of bladder, unspecified: Secondary | ICD-10-CM | POA: Diagnosis not present

## 2015-12-09 DIAGNOSIS — G309 Alzheimer's disease, unspecified: Secondary | ICD-10-CM | POA: Diagnosis not present

## 2015-12-09 DIAGNOSIS — M6281 Muscle weakness (generalized): Secondary | ICD-10-CM | POA: Diagnosis not present

## 2015-12-26 ENCOUNTER — Emergency Department (HOSPITAL_COMMUNITY)
Admission: EM | Admit: 2015-12-26 | Discharge: 2015-12-26 | Disposition: A | Discharge status: Home / Self Care | Payer: Medicare Other | Attending: Emergency Medicine | Admitting: Emergency Medicine

## 2015-12-26 ENCOUNTER — Encounter (HOSPITAL_COMMUNITY): Payer: Self-pay | Admitting: Emergency Medicine

## 2015-12-26 DIAGNOSIS — I10 Essential (primary) hypertension: Secondary | ICD-10-CM | POA: Diagnosis not present

## 2015-12-26 DIAGNOSIS — R300 Dysuria: Secondary | ICD-10-CM | POA: Diagnosis present

## 2015-12-26 DIAGNOSIS — Z8551 Personal history of malignant neoplasm of bladder: Secondary | ICD-10-CM | POA: Diagnosis not present

## 2015-12-26 DIAGNOSIS — Z87891 Personal history of nicotine dependence: Secondary | ICD-10-CM | POA: Insufficient documentation

## 2015-12-26 DIAGNOSIS — T83098A Other mechanical complication of other indwelling urethral catheter, initial encounter: Secondary | ICD-10-CM | POA: Insufficient documentation

## 2015-12-26 DIAGNOSIS — Z8673 Personal history of transient ischemic attack (TIA), and cerebral infarction without residual deficits: Secondary | ICD-10-CM | POA: Insufficient documentation

## 2015-12-26 DIAGNOSIS — T839XXA Unspecified complication of genitourinary prosthetic device, implant and graft, initial encounter: Secondary | ICD-10-CM

## 2015-12-26 DIAGNOSIS — T83498A Other mechanical complication of other prosthetic devices, implants and grafts of genital tract, initial encounter: Secondary | ICD-10-CM | POA: Diagnosis not present

## 2015-12-26 DIAGNOSIS — Y733 Surgical instruments, materials and gastroenterology and urology devices (including sutures) associated with adverse incidents: Secondary | ICD-10-CM | POA: Diagnosis not present

## 2015-12-26 DIAGNOSIS — Z79899 Other long term (current) drug therapy: Secondary | ICD-10-CM | POA: Diagnosis not present

## 2015-12-26 DIAGNOSIS — Z85118 Personal history of other malignant neoplasm of bronchus and lung: Secondary | ICD-10-CM | POA: Diagnosis not present

## 2015-12-26 DIAGNOSIS — D72825 Bandemia: Secondary | ICD-10-CM | POA: Insufficient documentation

## 2015-12-26 DIAGNOSIS — T83198A Other mechanical complication of other urinary devices and implants, initial encounter: Secondary | ICD-10-CM | POA: Diagnosis not present

## 2015-12-26 DIAGNOSIS — R110 Nausea: Secondary | ICD-10-CM | POA: Diagnosis not present

## 2015-12-26 DIAGNOSIS — Z7982 Long term (current) use of aspirin: Secondary | ICD-10-CM | POA: Insufficient documentation

## 2015-12-26 LAB — CBC WITH DIFFERENTIAL/PLATELET
Basophils Absolute: 0 10*3/uL (ref 0.0–0.1)
Basophils Relative: 0 %
Eosinophils Absolute: 0.2 10*3/uL (ref 0.0–0.7)
Eosinophils Relative: 1 %
HEMATOCRIT: 46.8 % (ref 39.0–52.0)
HEMOGLOBIN: 15.1 g/dL (ref 13.0–17.0)
LYMPHS ABS: 1 10*3/uL (ref 0.7–4.0)
LYMPHS PCT: 6 %
MCH: 29.3 pg (ref 26.0–34.0)
MCHC: 32.3 g/dL (ref 30.0–36.0)
MCV: 90.9 fL (ref 78.0–100.0)
MONOS PCT: 11 %
Monocytes Absolute: 2 10*3/uL — ABNORMAL HIGH (ref 0.1–1.0)
NEUTROS PCT: 82 %
Neutro Abs: 14.7 10*3/uL — ABNORMAL HIGH (ref 1.7–7.7)
Platelets: 190 10*3/uL (ref 150–400)
RBC: 5.15 MIL/uL (ref 4.22–5.81)
RDW: 15.1 % (ref 11.5–15.5)
WBC: 17.9 10*3/uL — AB (ref 4.0–10.5)

## 2015-12-26 LAB — BASIC METABOLIC PANEL
Anion gap: 7 (ref 5–15)
BUN: 29 mg/dL — AB (ref 6–20)
CHLORIDE: 109 mmol/L (ref 101–111)
CO2: 28 mmol/L (ref 22–32)
CREATININE: 1.29 mg/dL — AB (ref 0.61–1.24)
Calcium: 10.2 mg/dL (ref 8.9–10.3)
GFR calc Af Amer: 57 mL/min — ABNORMAL LOW (ref >60)
GFR calc non Af Amer: 50 mL/min — ABNORMAL LOW (ref >60)
GLUCOSE: 117 mg/dL — AB (ref 65–99)
POTASSIUM: 4.1 mmol/L (ref 3.5–5.1)
SODIUM: 144 mmol/L (ref 135–145)

## 2015-12-26 LAB — URINE MICROSCOPIC-ADD ON

## 2015-12-26 LAB — URINALYSIS, ROUTINE W REFLEX MICROSCOPIC
BILIRUBIN URINE: NEGATIVE
Glucose, UA: NEGATIVE mg/dL
Ketones, ur: NEGATIVE mg/dL
NITRITE: POSITIVE — AB
PH: 7.5 (ref 5.0–8.0)
Protein, ur: 100 mg/dL — AB
SPECIFIC GRAVITY, URINE: 1.013 (ref 1.005–1.030)

## 2015-12-26 NOTE — ED Provider Notes (Signed)
[facility] DEPT Provider Note   CSN: [id number] Arrival date & time: 12/26/15  1857     History   Chief Complaint Chief Complaint  Patient presents with  . Dysuria  . Nausea    HPI [name] is a 82 y.o. male.  The history is provided by a relative.  CC: foley dysfunction  Onset/Duration: this morning Timing: constant Quality: clogged Severity: severe Modifying Factors:  Improved by: nothing  Worsened by: nothing Associated Signs/Symptoms:  Pertinent (+): bilateral flank pain, emesis  Pertinent (-): fever, chills, abd pain, chest pain, sob Context: pt with h/o bladder cancer s/p chemo in remission for 15 yrs with indwelling catheter and recurrent UTIs  Remainder of history, ROS, and physical exam limited due to patient's condition (Dementia). Additional information was obtained from family.   Level V Caveat.    Past Medical History:  Diagnosis Date  . Atrial fibrillation ([place])   . Bladder cancer ([facility])    hx of surgery and chemo  . CVA (cerebral infarction)   . Dementia   . Depression 08/19/2014  . Diverticulitis   . Hyperlipidemia   . Hypertension   . Lung cancer [facility])    Status post surgery resection  . UTI (urinary tract infection)     Patient Active Problem List   Diagnosis Date Noted  . UTI (urinary tract infection) 08/07/2015  . Pyelonephritis 07/22/2015  . E. coli UTI 07/22/2015  . Leukocytosis 07/22/2015  . Dehydration 07/22/2015  . Rectal bleeding 07/22/2015  . Pressure ulcer 05/31/2015  . Hip pain   . Encephalopathy, metabolic   . GERD (gastroesophageal reflux disease) 08/19/2014  . Depression 08/19/2014  . UTI (lower urinary tract infection) 08/19/2014  . UTI (urinary tract infection) due to Enterococcus faecium 05/21/2014  . Bradycardia 05/03/2012  . Fall 04/30/2012  . Low back pain 04/30/2012  . Lung cancer ([place]) 10/10/2011  . Atrial fibrillation ([place]) 10/10/2011  . History of stroke 10/10/2011  . Malignant neoplasm  of bladder ([place]) 03/13/2008  . HLD (hyperlipidemia) 03/13/2008  . HYPERTENSION, BENIGN 02/13/2008    Past Surgical History:  Procedure Laterality Date  . CATARACT EXTRACTION, BILATERAL  1998, 2000  . CYSTECTOMY  2002  . HERNIA REPAIR  2006  . LUNG REMOVAL, PARTIAL         Home Medications    Prior to Admission medications   Medication Sig Start Date End Date Taking? Authorizing Provider  ALPHA LIPOIC ACID PO Take 200 mg by mouth every morning.    Yes Historical Provider, MD  amLODipine (NORVASC) 5 MG tablet Take 1 tablet (5 mg total) by mouth daily. 08/10/15  Yes Maryann Mikhail, DO  ascorbic acid (VITAMIN C) 1000 MG tablet Take 1,000 mg by mouth every morning.    Yes Historical Provider, MD  aspirin EC 81 MG tablet Take 81 mg by mouth daily with breakfast.   Yes Historical Provider, MD  atorvastatin (LIPITOR) 10 MG tablet Take 10 mg by mouth at bedtime.    Yes Historical Provider, MD  buPROPion (WELLBUTRIN) 75 MG tablet Take 75 mg by mouth daily with breakfast.    Yes Historical Provider, MD  calcium carbonate (OS-CAL - DOSED IN MG OF ELEMENTAL CALCIUM) 1250 MG tablet Take 1 tablet by mouth daily with breakfast.    Yes Historical Provider, MD  Cholecalciferol (EQL VITAMIN D3) 1000 units tablet Take 1,000 Units by mouth every morning.   Yes Historical Provider, MD  Cyanocobalamin (VITAMIN B 12 PO) Take 50 mcg by mouth  every morning.    Yes Historical Provider, MD  esomeprazole (NEXIUM) 40 MG capsule Take 40 mg by mouth daily as needed (reflux).   Yes Historical Provider, MD  feeding supplement, ENSURE ENLIVE, (ENSURE ENLIVE) LIQD Take 237 mLs by mouth 2 (two) times daily between meals. 08/10/15  Yes Maryann Mikhail, DO  hydrocortisone valerate cream (WESTCORT) 0.2 % Apply 1 application topically 2 (two) times daily as needed (itching).    Yes Historical Provider, MD  MELATONIN PO Take 3 mg by mouth at bedtime as needed (sleep).    Yes Historical Provider, MD  metoprolol succinate  (TOPROL-XL) 25 MG 24 hr tablet Take 25 mg by mouth at bedtime.  07/06/15  Yes Historical Provider, MD  saccharomyces boulardii (FLORASTOR) 250 MG capsule Take 1 capsule (250 mg total) by mouth 2 (two) times daily. Patient taking differently: Take 250 mg by mouth daily.  06/02/15  Yes Eugenie Filler, MD  sertraline (ZOLOFT) 100 MG tablet Take 100 mg by mouth daily with breakfast.    Yes Historical Provider, MD  Vitamins A & D (VITAMIN A & D) ointment Apply 1 application topically as needed for dry skin (sore).   Yes Historical Provider, MD  zolpidem (AMBIEN) 5 MG tablet Take 5 mg by mouth at bedtime as needed for sleep.   Yes Historical Provider, MD  Maltodextrin-Xanthan Gum ([place]) POWD Use as needed to thicken liquids Patient not taking: Reported on 12/26/2015 08/10/15   Velta Addison Mikhail, DO  oxybutynin (DITROPAN) 5 MG tablet Take 0.5 tablets (2.5 mg total) by mouth 2 (two) times daily. Patient not taking: Reported on 12/26/2015 08/10/15   Cristal Ford, DO    Family History Family History  Problem Relation Age of Onset  . Cancer Maternal Aunt     unknown  . Cancer Maternal Grandmother     unknown  . Aneurysm Brother   . Stroke Father     cerebral hemorrhage    Social History Social History  Substance Use Topics  . Smoking status: Former Smoker    Types: Cigarettes    Quit date: 10/10/1991  . Smokeless tobacco: Never Used  . Alcohol use No     Allergies   Sulfa antibiotics and Sulfonamide derivatives   Review of Systems Review of Systems  Unable to perform ROS: Dementia     Physical Exam Updated Vital Signs BP (!) 184/110   Pulse 82   Temp 99 F (37.2 C) (Oral)   Resp 18   SpO2 96%   Vitals:   12/26/15 2229 12/26/15 2319  BP: 147/87 124/89  Pulse: 78 67  Resp: 23 17  Temp:  98.2 F (36.8 C)  TempSrc:  Oral  SpO2: 96% 97%    Physical Exam  Constitutional: He is oriented to person, place, and time. He appears well-developed and  well-nourished. No distress.  HENT:  Head: Normocephalic and atraumatic.  Nose: Nose normal.  Eyes: Conjunctivae and EOM are normal. Pupils are equal, round, and reactive to light. Right eye exhibits no discharge. Left eye exhibits no discharge. No scleral icterus.  Neck: Normal range of motion. Neck supple.  Cardiovascular: Normal rate and regular rhythm.  Exam reveals no gallop and no friction rub.   No murmur heard. Pulmonary/Chest: Effort normal and breath sounds normal. No stridor. No respiratory distress. He has no rales.  Abdominal: Soft. He exhibits no distension. There is no tenderness.  Musculoskeletal: He exhibits no edema or tenderness.  Neurological: He is alert and oriented to  person, place, and time.  Skin: Skin is warm and dry. No rash noted. He is not diaphoretic. No erythema.  Psychiatric: He has a normal mood and affect.  Vitals reviewed.    ED Treatments / Results  Labs (all labs ordered are listed, but only abnormal results are displayed) Labs Reviewed  URINALYSIS, ROUTINE W REFLEX MICROSCOPIC (NOT AT [facility]) - Abnormal; Notable for the following:       Result Value   APPearance TURBID (*)    Hgb urine dipstick LARGE (*)    Protein, ur 100 (*)    Nitrite POSITIVE (*)    Leukocytes, UA LARGE (*)    All other components within normal limits  CBC WITH DIFFERENTIAL/PLATELET - Abnormal; Notable for the following:    WBC 17.9 (*)    Neutro Abs 14.7 (*)    Monocytes Absolute 2.0 (*)    All other components within normal limits  BASIC METABOLIC PANEL - Abnormal; Notable for the following:    Glucose, Bld 117 (*)    BUN 29 (*)    Creatinine, Ser 1.29 (*)    GFR calc non Af Amer 50 (*)    GFR calc Af Amer 57 (*)    All other components within normal limits  URINE MICROSCOPIC-ADD ON - Abnormal; Notable for the following:    Squamous Epithelial / LPF 0-5 (*)    Bacteria, UA MANY (*)    All other components within normal limits  URINE CULTURE    EKG  EKG  Interpretation None       Radiology No results found.  Procedures Procedures (including critical care time)  Medications Ordered in ED Medications - No data to display   Initial Impression / Assessment and Plan / ED Course  I have reviewed the triage vital signs and the nursing notes.  Pertinent labs & imaging results that were available during my care of the patient were reviewed by me and considered in my medical decision making (see chart for details).  Clinical Course     On review of records patient has a history of multidrug resistant bacterial urinary tract infections. Foley was replaced by the nurse and draining appropriately. Labs without evidence of significant acute renal injury. Patient does have leukocytosis. Urine studies grossly abnormal; which to be expected. Urine culture sent.   The patient is afebrile, well-hydrated, well-appearing, nontoxic.  Case was discussed with Dr. Jeffie Pollock who is the on-call urologist, who agreed that patient would be adequate for outpatient follow-up of the urine culture and appropriate treatment if needed. Patient already has close follow-up with urology early this week.  Discussed options with the family who agreed to follow-up in clinic for further management. Strict return precautions given.   Final Clinical Impressions(s) / ED Diagnoses   Final diagnoses:  Problem with Foley catheter, initial encounter ([place])  Bandemia   Disposition: Discharge  Condition: Good  I have discussed the results, Dx and Tx plan with the patient who expressed understanding and agree(s) with the plan. Discharge instructions discussed at great length. The patient was given strict return precautions who verbalized understanding of the instructions. No further questions at time of discharge.    Discharge Medication List as of 12/26/2015 10:59 PM      Follow Up: [name], MD [place] [address] [phone]     [facility] [place] [phone] Go on 01/06/2016 as scheduled  and to follow up  for urine culture results.      Fatima Blank, MD 12/27/15 [phone]

## 2015-12-26 NOTE — ED Triage Notes (Signed)
Per EMS. Hx of dementia, oriented per norm. Lives at home with family. Family noticed pt has had a foul smell to his foley and decreased urinary output since last night. Attempted to flush catheter at home with no relief. Pt threw up this am and also had dry heaves this afternoon. Hx of being admitted for urosepsis

## 2015-12-26 NOTE — ED Notes (Signed)
Bed: OI71 Expected date:  Expected time:  Means of arrival:  Comments: EMS Urinary Retention/UTI

## 2015-12-26 NOTE — ED Notes (Signed)
ED Provider at bedside. 

## 2015-12-29 ENCOUNTER — Observation Stay (HOSPITAL_COMMUNITY)
Admission: EM | Admit: 2015-12-29 | Discharge: 2015-12-30 | Disposition: A | Discharge status: Home Health | Payer: Medicare Other | Attending: Nephrology | Admitting: Nephrology

## 2015-12-29 ENCOUNTER — Encounter (HOSPITAL_COMMUNITY): Payer: Self-pay | Admitting: *Deleted

## 2015-12-29 DIAGNOSIS — K219 Gastro-esophageal reflux disease without esophagitis: Secondary | ICD-10-CM | POA: Diagnosis not present

## 2015-12-29 DIAGNOSIS — Z7982 Long term (current) use of aspirin: Secondary | ICD-10-CM | POA: Diagnosis not present

## 2015-12-29 DIAGNOSIS — Z8551 Personal history of malignant neoplasm of bladder: Secondary | ICD-10-CM

## 2015-12-29 DIAGNOSIS — Z9221 Personal history of antineoplastic chemotherapy: Secondary | ICD-10-CM | POA: Diagnosis not present

## 2015-12-29 DIAGNOSIS — Z87891 Personal history of nicotine dependence: Secondary | ICD-10-CM | POA: Diagnosis not present

## 2015-12-29 DIAGNOSIS — B964 Proteus (mirabilis) (morganii) as the cause of diseases classified elsewhere: Secondary | ICD-10-CM | POA: Diagnosis not present

## 2015-12-29 DIAGNOSIS — F329 Major depressive disorder, single episode, unspecified: Secondary | ICD-10-CM | POA: Diagnosis present

## 2015-12-29 DIAGNOSIS — Z85118 Personal history of other malignant neoplasm of bronchus and lung: Secondary | ICD-10-CM | POA: Diagnosis not present

## 2015-12-29 DIAGNOSIS — Z8673 Personal history of transient ischemic attack (TIA), and cerebral infarction without residual deficits: Secondary | ICD-10-CM

## 2015-12-29 DIAGNOSIS — R410 Disorientation, unspecified: Secondary | ICD-10-CM

## 2015-12-29 DIAGNOSIS — Z66 Do not resuscitate: Secondary | ICD-10-CM | POA: Diagnosis not present

## 2015-12-29 DIAGNOSIS — N390 Urinary tract infection, site not specified: Secondary | ICD-10-CM | POA: Diagnosis not present

## 2015-12-29 DIAGNOSIS — I4891 Unspecified atrial fibrillation: Secondary | ICD-10-CM | POA: Insufficient documentation

## 2015-12-29 DIAGNOSIS — G9341 Metabolic encephalopathy: Secondary | ICD-10-CM | POA: Insufficient documentation

## 2015-12-29 DIAGNOSIS — F0390 Unspecified dementia without behavioral disturbance: Secondary | ICD-10-CM | POA: Diagnosis not present

## 2015-12-29 DIAGNOSIS — Z9181 History of falling: Secondary | ICD-10-CM | POA: Diagnosis not present

## 2015-12-29 DIAGNOSIS — E860 Dehydration: Secondary | ICD-10-CM | POA: Insufficient documentation

## 2015-12-29 DIAGNOSIS — R531 Weakness: Secondary | ICD-10-CM | POA: Diagnosis present

## 2015-12-29 DIAGNOSIS — Z79899 Other long term (current) drug therapy: Secondary | ICD-10-CM | POA: Insufficient documentation

## 2015-12-29 DIAGNOSIS — Z906 Acquired absence of other parts of urinary tract: Secondary | ICD-10-CM | POA: Diagnosis not present

## 2015-12-29 DIAGNOSIS — F05 Delirium due to known physiological condition: Secondary | ICD-10-CM

## 2015-12-29 DIAGNOSIS — Z9079 Acquired absence of other genital organ(s): Secondary | ICD-10-CM | POA: Diagnosis not present

## 2015-12-29 DIAGNOSIS — Z87440 Personal history of urinary (tract) infections: Secondary | ICD-10-CM | POA: Insufficient documentation

## 2015-12-29 DIAGNOSIS — E785 Hyperlipidemia, unspecified: Secondary | ICD-10-CM | POA: Insufficient documentation

## 2015-12-29 DIAGNOSIS — F32A Depression, unspecified: Secondary | ICD-10-CM | POA: Diagnosis present

## 2015-12-29 DIAGNOSIS — I10 Essential (primary) hypertension: Secondary | ICD-10-CM | POA: Insufficient documentation

## 2015-12-29 LAB — URINALYSIS, ROUTINE W REFLEX MICROSCOPIC
Bilirubin Urine: NEGATIVE
Glucose, UA: NEGATIVE mg/dL
Ketones, ur: NEGATIVE mg/dL
NITRITE: NEGATIVE
PROTEIN: 30 mg/dL — AB
SPECIFIC GRAVITY, URINE: 1.012 (ref 1.005–1.030)
pH: 6.5 (ref 5.0–8.0)

## 2015-12-29 LAB — CBC WITH DIFFERENTIAL/PLATELET
BASOS ABS: 0 10*3/uL (ref 0.0–0.1)
Basophils Relative: 0 %
EOS ABS: 0.5 10*3/uL (ref 0.0–0.7)
EOS PCT: 4 %
HCT: 44.9 % (ref 39.0–52.0)
HEMOGLOBIN: 14.4 g/dL (ref 13.0–17.0)
LYMPHS ABS: 1.6 10*3/uL (ref 0.7–4.0)
LYMPHS PCT: 12 %
MCH: 29.1 pg (ref 26.0–34.0)
MCHC: 32.1 g/dL (ref 30.0–36.0)
MCV: 90.9 fL (ref 78.0–100.0)
Monocytes Absolute: 1.3 10*3/uL — ABNORMAL HIGH (ref 0.1–1.0)
Monocytes Relative: 10 %
NEUTROS PCT: 74 %
Neutro Abs: 9.5 10*3/uL — ABNORMAL HIGH (ref 1.7–7.7)
PLATELETS: 211 10*3/uL (ref 150–400)
RBC: 4.94 MIL/uL (ref 4.22–5.81)
RDW: 15.2 % (ref 11.5–15.5)
WBC: 12.8 10*3/uL — AB (ref 4.0–10.5)

## 2015-12-29 LAB — URINE MICROSCOPIC-ADD ON

## 2015-12-29 LAB — BASIC METABOLIC PANEL
ANION GAP: 6 (ref 5–15)
BUN: 23 mg/dL — ABNORMAL HIGH (ref 6–20)
CHLORIDE: 105 mmol/L (ref 101–111)
CO2: 28 mmol/L (ref 22–32)
Calcium: 9.6 mg/dL (ref 8.9–10.3)
Creatinine, Ser: 1.24 mg/dL (ref 0.61–1.24)
GFR calc Af Amer: >60 mL/min (ref >60)
GFR, EST NON AFRICAN AMERICAN: 52 mL/min — AB (ref >60)
Glucose, Bld: 127 mg/dL — ABNORMAL HIGH (ref 65–99)
POTASSIUM: 4.2 mmol/L (ref 3.5–5.1)
SODIUM: 139 mmol/L (ref 135–145)

## 2015-12-29 LAB — POC OCCULT BLOOD, ED: FECAL OCCULT BLD: POSITIVE — AB

## 2015-12-29 MED ORDER — VITAMIN D3 25 MCG (1000 UNIT) PO TABS
1000.0000 [IU] | ORAL_TABLET | Freq: Every day | ORAL | Status: DC
  Administered 2015-12-30: 1000 [IU] via ORAL
  Filled 2015-12-29: qty 1

## 2015-12-29 MED ORDER — ONDANSETRON HCL 4 MG PO TABS: 4.0000 mg | ORAL_TABLET | Freq: Four times a day (QID) | ORAL | Status: DC | PRN

## 2015-12-29 MED ORDER — SODIUM CHLORIDE 0.9 % IV SOLN: INTRAVENOUS | Status: DC

## 2015-12-29 MED ORDER — METOPROLOL SUCCINATE ER 25 MG PO TB24
25.0000 mg | ORAL_TABLET | Freq: Every day | ORAL | Status: DC
  Administered 2015-12-30: 25 mg via ORAL
  Filled 2015-12-29: qty 1

## 2015-12-29 MED ORDER — BUPROPION HCL 75 MG PO TABS
75.0000 mg | ORAL_TABLET | Freq: Every day | ORAL | Status: DC
  Administered 2015-12-30: 75 mg via ORAL
  Filled 2015-12-29: qty 1

## 2015-12-29 MED ORDER — PANTOPRAZOLE SODIUM 40 MG PO TBEC
40.0000 mg | DELAYED_RELEASE_TABLET | Freq: Every day | ORAL | Status: DC
  Administered 2015-12-30: 40 mg via ORAL
  Filled 2015-12-29: qty 1

## 2015-12-29 MED ORDER — VITAMIN C 500 MG PO TABS
1000.0000 mg | ORAL_TABLET | Freq: Every day | ORAL | Status: DC
  Administered 2015-12-30: 1000 mg via ORAL
  Filled 2015-12-29: qty 2

## 2015-12-29 MED ORDER — ASPIRIN EC 81 MG PO TBEC
81.0000 mg | DELAYED_RELEASE_TABLET | Freq: Every day | ORAL | Status: DC
  Administered 2015-12-30: 81 mg via ORAL
  Filled 2015-12-29: qty 1

## 2015-12-29 MED ORDER — DEXTROSE 5 % IV SOLN
2.0000 g | Freq: Two times a day (BID) | INTRAVENOUS | Status: DC
  Administered 2015-12-29 – 2015-12-30 (×2): 2 g via INTRAVENOUS
  Filled 2015-12-29 (×3): qty 2

## 2015-12-29 MED ORDER — ENSURE ENLIVE PO LIQD
237.0000 mL | Freq: Every day | ORAL | Status: DC
  Administered 2015-12-30: 237 mL via ORAL

## 2015-12-29 MED ORDER — CALCIUM CARBONATE-VITAMIN D 500-200 MG-UNIT PO TABS
1.0000 | ORAL_TABLET | Freq: Every day | ORAL | Status: DC
  Administered 2015-12-30: 1 via ORAL
  Filled 2015-12-29: qty 1

## 2015-12-29 MED ORDER — DEXTROSE 5 % IV SOLN
1.0000 g | Freq: Once | INTRAVENOUS | Status: AC
  Administered 2015-12-29: 1 g via INTRAVENOUS
  Filled 2015-12-29: qty 10

## 2015-12-29 MED ORDER — ACETAMINOPHEN 650 MG RE SUPP: 650.0000 mg | Freq: Four times a day (QID) | RECTAL | Status: DC | PRN

## 2015-12-29 MED ORDER — ATORVASTATIN CALCIUM 10 MG PO TABS
10.0000 mg | ORAL_TABLET | Freq: Every day | ORAL | Status: DC
  Administered 2015-12-29: 10 mg via ORAL
  Filled 2015-12-29: qty 1

## 2015-12-29 MED ORDER — ZOLPIDEM TARTRATE 5 MG PO TABS: 5.0000 mg | ORAL_TABLET | Freq: Every evening | ORAL | Status: DC | PRN

## 2015-12-29 MED ORDER — ENOXAPARIN SODIUM 40 MG/0.4ML ~~LOC~~ SOLN
40.0000 mg | Freq: Every day | SUBCUTANEOUS | Status: DC
  Administered 2015-12-29: 40 mg via SUBCUTANEOUS
  Filled 2015-12-29: qty 0.4

## 2015-12-29 MED ORDER — ACETAMINOPHEN 325 MG PO TABS: 650.0000 mg | ORAL_TABLET | Freq: Four times a day (QID) | ORAL | Status: DC | PRN

## 2015-12-29 MED ORDER — SODIUM CHLORIDE 0.45 % IV SOLN
INTRAVENOUS | Status: DC
  Administered 2015-12-29 – 2015-12-30 (×2): via INTRAVENOUS

## 2015-12-29 MED ORDER — RISAQUAD PO CAPS
1.0000 | ORAL_CAPSULE | Freq: Every day | ORAL | Status: DC
  Administered 2015-12-30: 1 via ORAL
  Filled 2015-12-29: qty 1

## 2015-12-29 MED ORDER — SODIUM CHLORIDE 0.9 % IV BOLUS (SEPSIS)
1000.0000 mL | Freq: Once | INTRAVENOUS | Status: AC
  Administered 2015-12-29: 1000 mL via INTRAVENOUS

## 2015-12-29 MED ORDER — ALPHA LIPOIC ACID 200 MG PO CAPS: 200.0000 mg | ORAL_CAPSULE | Freq: Every day | ORAL | Status: DC

## 2015-12-29 MED ORDER — ONDANSETRON HCL 4 MG/2ML IJ SOLN: 4.0000 mg | Freq: Four times a day (QID) | INTRAMUSCULAR | Status: DC | PRN

## 2015-12-29 MED ORDER — SERTRALINE HCL 50 MG PO TABS
100.0000 mg | ORAL_TABLET | Freq: Every day | ORAL | Status: DC
  Administered 2015-12-30: 100 mg via ORAL
  Filled 2015-12-29: qty 2

## 2015-12-29 MED ORDER — AMLODIPINE BESYLATE 5 MG PO TABS
5.0000 mg | ORAL_TABLET | Freq: Every day | ORAL | Status: DC
  Filled 2015-12-29: qty 1

## 2015-12-29 NOTE — Progress Notes (Signed)
Pharmacy Antibiotic Note  [name] is a 82 y.o. male c/o weakness and confusion with hx of UTI admitted on 12/29/2015 with UTI. Urine culture from 11/4 grew Proteus. Pharmacy has been consulted for cefepime dosing.  Plan: Cefepime 2 gm IV q12h  Height: '6\' 2"'$  (188 cm) Weight: 189 lb (85.7 kg) IBW/kg (Calculated) : 82.2  Temp (24hrs), Avg:97.7 F (36.5 C), Min:97.5 F (36.4 C), Max:98 F (36.7 C)   Recent Labs Lab 12/26/15 2025 12/29/15 1505  WBC 17.9* 12.8*  CREATININE 1.29* 1.24    Estimated Creatinine Clearance: 52.5 mL/min (by C-G formula based on SCr of 1.24 mg/dL).    Allergies  Allergen Reactions  . Sulfa Antibiotics Nausea And Vomiting  . Sulfonamide Derivatives Nausea And Vomiting    Antimicrobials this admission: 11/7 rocephin >> x1 ED 11/7 cefepime >>   Dose adjustments this admission:   Microbiology results:  BCx:   UCx:    Sputum:    MRSA PCR:   Thank you for allowing pharmacy to be a part of this patient's care.  Dorrene German 12/29/2015 10:33 PM

## 2015-12-29 NOTE — ED Notes (Signed)
Gave report to Edgar, Therapist, sports

## 2015-12-29 NOTE — H&P (Addendum)
[facility] History and Physical  NYMIR RINGLER HOZ:[id number] DOB: 1930/12/18 DOA: 12/29/2015  Referring physician: Dr Lacinda Axon, ED Dirk Dress PCP: [name], MD   Chief Complaint: Gen'd weakness, confusion  HPI: [name] is a 82 y.o. male with history of HTN, lung cancer, dementia, CVA, afib and bladder cancer. In 2002 he underwent cystoprostatectomy and ileal neobladder formation by Dr Jeffie Pollock for management of invasive bladder cancer.  Got postop chemoRx. Cancer free per urology.  He had a chronic indwelling foley cath, changed 2x per day according to the patient.  They have a private nurse does the am change and his grand-daughter does the evening exchange.  He lives with his wife.  Came to ED w gen'd weakness and confusion.  Has had 4 admissions this year for UTI, the last was in June 2017 and UCx grew yeast.  The time before was May 2017 and UCx grew resistant EColi, PICC required for home IV abx.    Over the last 3 years has had UTI's due to [facility], pseudomona, E. Faecalis, and Acinetobacter.   Patient was in the ED 3 days ago for similar c/o, the urine cx from that day is growing Proteus.   ROS  denies CP  no joint pain   no HA  no blurry vision  no rash  +diarrhea  vomited once  no change in urine color    Past Medical History  Past Medical History:  Diagnosis Date  . Atrial fibrillation ([place])   . Bladder cancer (HCC)    hx of surgery and chemo  . CVA (cerebral infarction)   . Dementia   . Depression 08/19/2014  . Diverticulitis   . Hyperlipidemia   . Hypertension   . Lung cancer [facility])    Status post surgery resection  . UTI (urinary tract infection)    Past Surgical History  Past Surgical History:  Procedure Laterality Date  . CATARACT EXTRACTION, BILATERAL  1998, 2000  . CYSTECTOMY  2002  . HERNIA REPAIR  2006  . LUNG REMOVAL, PARTIAL     Family History  Family History  Problem Relation Age of Onset  . Cancer Maternal Aunt     unknown  .  Cancer Maternal Grandmother     unknown  . Aneurysm Brother   . Stroke Father     cerebral hemorrhage   Social History  reports that he quit smoking about 24 years ago. His smoking use included Cigarettes. He has never used smokeless tobacco. He reports that he does not drink alcohol or use drugs. Allergies  Allergies  Allergen Reactions  . Sulfa Antibiotics Nausea And Vomiting  . Sulfonamide Derivatives Nausea And Vomiting   Home medications Prior to Admission medications   Medication Sig Start Date End Date Taking? Authorizing Provider  acidophilus (RISAQUAD) CAPS capsule Take 1 capsule by mouth daily.   Yes Historical Provider, MD  Alpha Lipoic Acid 200 MG CAPS Take 200 mg by mouth daily.   Yes Historical Provider, MD  amLODipine (NORVASC) 5 MG tablet Take 1 tablet (5 mg total) by mouth daily. 08/10/15  Yes Maryann Mikhail, DO  ascorbic acid (VITAMIN C) 1000 MG tablet Take 1,000 mg by mouth daily.    Yes Historical Provider, MD  aspirin EC 81 MG tablet Take 81 mg by mouth daily.    Yes Historical Provider, MD  atorvastatin (LIPITOR) 10 MG tablet Take 10 mg by mouth at bedtime.    Yes Historical Provider, MD  buPROPion (WELLBUTRIN) 75 MG  tablet Take 75 mg by mouth daily.    Yes Historical Provider, MD  Calcium-Magnesium-Vitamin D (CALCIUM 1200+D3) 600-40-500 MG-MG-UNIT TB24 Take 1 tablet by mouth daily.   Yes Historical Provider, MD  cholecalciferol (VITAMIN D) 1000 units tablet Take 1,000 Units by mouth daily.   Yes Historical Provider, MD  ciprofloxacin (CIPRO) 500 MG tablet Take 500 mg by mouth 2 (two) times daily. 12/28/15 01/04/16 Yes Historical Provider, MD  esomeprazole (NEXIUM) 40 MG capsule Take 40 mg by mouth daily as needed (for acid reflux).    Yes Historical Provider, MD  feeding supplement, ENSURE ENLIVE, (ENSURE ENLIVE) LIQD Take 237 mLs by mouth daily.   Yes Historical Provider, MD  hydrocortisone valerate cream (WESTCORT) 0.2 % Apply 1 application topically 2 (two) times  daily as needed (for itching/irritation).    Yes Historical Provider, MD  Melatonin 5 MG TABS Take 5 mg by mouth at bedtime.   Yes Historical Provider, MD  metoprolol succinate (TOPROL-XL) 25 MG 24 hr tablet Take 25 mg by mouth daily.    Yes Historical Provider, MD  sertraline (ZOLOFT) 100 MG tablet Take 100 mg by mouth daily.    Yes Historical Provider, MD  vitamin B-12 (CYANOCOBALAMIN) 1000 MCG tablet Take 1,000 mcg by mouth daily.   Yes Historical Provider, MD  zolpidem (AMBIEN) 5 MG tablet Take 5 mg by mouth at bedtime.    Yes Historical Provider, MD   Liver Function Tests No results for input(s): AST, ALT, ALKPHOS, BILITOT, PROT, ALBUMIN in the last 168 hours. No results for input(s): LIPASE, AMYLASE in the last 168 hours. CBC  Recent Labs Lab 12/26/15 2025 12/29/15 1505  WBC 17.9* 12.8*  NEUTROABS 14.7* 9.5*  HGB 15.1 14.4  HCT 46.8 44.9  MCV 90.9 90.9  PLT 190 903   Basic Metabolic Panel  Recent Labs Lab 12/26/15 2025 12/29/15 1505  NA 144 139  K 4.1 4.2  CL 109 105  CO2 28 28  GLUCOSE 117* 127*  BUN 29* 23*  CREATININE 1.29* 1.24  CALCIUM 10.2 9.6     Vitals:   12/29/15 1144 12/29/15 1145 12/29/15 1458 12/29/15 1737  BP: 114/79  114/75 137/87  Pulse: 61  76 101  Resp: _0 Temp: 97.5 F (36.4 C)     TempSrc: Oral     SpO2: 100%  96% 97%  Weight:  85.7 kg (189 lb)    Height:  _1  (1.88 m)     Exam: Gen alert, no distress, elderly, appropriate No rash, cyanosis or gangrene Sclera anicteric, throat clear  No jvd or bruits Chest clear bilat RRR no MRG Abd soft ntnd no mass or ascites +bs GU normal male w foley draining into leg bag, urine looks cloudy and yellow-brown MS no joint effusions or deformity Ext no leg edema / no wounds or ulcers Neuro is alert, Ox 3 , nf   Na 139  K 4.2  BUN 23  Cr 1.24   eGFR 52   WBC 12k  Hb 14  plt 211 UA > large LE, tntc WBC, 6-30 rbc UA ED visit 11/4 > tntc WBC/ RBC, +nit/ LE UCx ED visit 11/4 >  proteus, they are redoing the susceptibilites   Assessment: 1. Confusion/ gen weakness - suspect recurrent UTI.  Patient w neobladder and multiple UTI's in the past, chron indwell foley.   2. 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 Hx lung cancer -  had surgical Rx in [place] this year or last year 4. HTN 5. Dementia - mild, forgetful, Ox 3 6. Depression 7. Hx afib - no anticoag due to fall risk 8. DNR - reconfirmed with pt his wish for DNR as on last admit  Plan - admit, IVF's, IV Rocephin, f/u urine cx results from 11/4 and today     Sol Blazing [facility] Pager [phone]   If 7PM-7AM, please contact night-coverage [email or web address] Password [name] 12/29/2015, 6:40 PM

## 2015-12-29 NOTE — ED Triage Notes (Signed)
Patient is alert and oriented x4.  He is being seen for rectal bleeding that started Sunday.  Patient denies having any pain and does not have any history of this issue.  Patient is not taking any anticoagulants.

## 2015-12-29 NOTE — Progress Notes (Signed)
PHARMACIST - PHYSICIAN ORDER COMMUNICATION  CONCERNING: P&T Medication Policy on Herbal Medications  DESCRIPTION:  This patient's order for:  Alpha-lipoic acid  has been noted.  This product(s) is classified as an "herbal" or natural product. Due to a lack of definitive safety studies or FDA approval, nonstandard manufacturing practices, plus the potential risk of unknown drug-drug interactions while on inpatient medications, the Pharmacy and Therapeutics Committee does not permit the use of "herbal" or natural products of this type within [facility].   ACTION TAKEN: The pharmacy department is unable to verify this order at this time and your patient has been informed of this safety policy. Please reevaluate patient's clinical condition at discharge and address if the herbal or natural product(s) should be resumed at that time.   Thanks Dorrene German 12/29/2015 10:59 PM

## 2015-12-29 NOTE — ED Provider Notes (Signed)
[facility] DEPT Provider Note   CSN: [id number] Arrival date & time: 12/29/15  1129     History   Chief Complaint Chief Complaint  Patient presents with  . Rectal Bleeding    HPI [name] is a 82 y.o. male.  Level V caveat for dementia. Patient seen here on Saturday with a diagnosis of UTI. Antibiotics were not started secondary to culture and sensitivity pending. Patient feels weak. No energy. Patient has indwelling Foley catheter. Apparently Cipro 500 mg started yesterday by his primary care physician. Family feels he has deteriorated further from his past visit.      Past Medical History:  Diagnosis Date  . Atrial fibrillation ([place])   . Bladder cancer (HCC)    hx of surgery and chemo  . CVA (cerebral infarction)   . Dementia   . Depression 08/19/2014  . Diverticulitis   . Hyperlipidemia   . Hypertension   . Lung cancer [facility])    Status post surgery resection  . UTI (urinary tract infection)     Patient Active Problem List   Diagnosis Date Noted  . UTI (urinary tract infection) 08/07/2015  . Pyelonephritis 07/22/2015  . E. coli UTI 07/22/2015  . Leukocytosis 07/22/2015  . Dehydration 07/22/2015  . Rectal bleeding 07/22/2015  . Pressure ulcer 05/31/2015  . Hip pain   . Encephalopathy, metabolic   . GERD (gastroesophageal reflux disease) 08/19/2014  . Depression 08/19/2014  . UTI (lower urinary tract infection) 08/19/2014  . UTI (urinary tract infection) due to Enterococcus faecium 05/21/2014  . Bradycardia 05/03/2012  . Fall 04/30/2012  . Low back pain 04/30/2012  . Lung cancer ([place]) 10/10/2011  . Atrial fibrillation ([place]) 10/10/2011  . History of stroke 10/10/2011  . Malignant neoplasm of bladder ([place]) 03/13/2008  . HLD (hyperlipidemia) 03/13/2008  . HYPERTENSION, BENIGN 02/13/2008    Past Surgical History:  Procedure Laterality Date  . CATARACT EXTRACTION, BILATERAL  1998, 2000  . CYSTECTOMY  2002  . HERNIA REPAIR  2006  . LUNG  REMOVAL, PARTIAL         Home Medications    Prior to Admission medications   Medication Sig Start Date End Date Taking? Authorizing Provider  acidophilus (RISAQUAD) CAPS capsule Take 1 capsule by mouth daily.   Yes Historical Provider, MD  Alpha Lipoic Acid 200 MG CAPS Take 200 mg by mouth daily.   Yes Historical Provider, MD  amLODipine (NORVASC) 5 MG tablet Take 1 tablet (5 mg total) by mouth daily. 08/10/15  Yes Maryann Mikhail, DO  ascorbic acid (VITAMIN C) 1000 MG tablet Take 1,000 mg by mouth daily.    Yes Historical Provider, MD  aspirin EC 81 MG tablet Take 81 mg by mouth daily.    Yes Historical Provider, MD  atorvastatin (LIPITOR) 10 MG tablet Take 10 mg by mouth at bedtime.    Yes Historical Provider, MD  buPROPion (WELLBUTRIN) 75 MG tablet Take 75 mg by mouth daily.    Yes Historical Provider, MD  Calcium-Magnesium-Vitamin D (CALCIUM 1200+D3) 600-40-500 MG-MG-UNIT TB24 Take 1 tablet by mouth daily.   Yes Historical Provider, MD  cholecalciferol (VITAMIN D) 1000 units tablet Take 1,000 Units by mouth daily.   Yes Historical Provider, MD  ciprofloxacin (CIPRO) 500 MG tablet Take 500 mg by mouth 2 (two) times daily. 12/28/15 01/04/16 Yes Historical Provider, MD  esomeprazole (NEXIUM) 40 MG capsule Take 40 mg by mouth daily as needed (for acid reflux).    Yes Historical Provider, MD  feeding  supplement, ENSURE ENLIVE, (ENSURE ENLIVE) LIQD Take 237 mLs by mouth daily.   Yes Historical Provider, MD  hydrocortisone valerate cream (WESTCORT) 0.2 % Apply 1 application topically 2 (two) times daily as needed (for itching/irritation).    Yes Historical Provider, MD  Melatonin 5 MG TABS Take 5 mg by mouth at bedtime.   Yes Historical Provider, MD  metoprolol succinate (TOPROL-XL) 25 MG 24 hr tablet Take 25 mg by mouth daily.    Yes Historical Provider, MD  sertraline (ZOLOFT) 100 MG tablet Take 100 mg by mouth daily.    Yes Historical Provider, MD  vitamin B-12 (CYANOCOBALAMIN) 1000 MCG  tablet Take 1,000 mcg by mouth daily.   Yes Historical Provider, MD  zolpidem (AMBIEN) 5 MG tablet Take 5 mg by mouth at bedtime.    Yes Historical Provider, MD    Family History Family History  Problem Relation Age of Onset  . Cancer Maternal Aunt     unknown  . Cancer Maternal Grandmother     unknown  . Aneurysm Brother   . Stroke Father     cerebral hemorrhage    Social History Social History  Substance Use Topics  . Smoking status: Former Smoker    Types: Cigarettes    Quit date: 10/10/1991  . Smokeless tobacco: Never Used  . Alcohol use No     Allergies   Sulfa antibiotics and Sulfonamide derivatives   Review of Systems Review of Systems  Reason unable to perform ROS: Dementia.     Physical Exam Updated Vital Signs BP 137/87 (BP Location: Left Arm)   Pulse 101   Temp 97.5 F (36.4 C) (Oral)   Resp 18   Ht '6\' 2"'$  (1.88 m)   Wt 189 lb (85.7 kg)   SpO2 97%   BMI 24.27 kg/m   Physical Exam  Constitutional: He is oriented to person, place, and time.  Pleasant, pale, slightly demented  HENT:  Head: Normocephalic and atraumatic.  Eyes: Conjunctivae are normal.  Neck: Neck supple.  Cardiovascular: Normal rate and regular rhythm.   Pulmonary/Chest: Effort normal and breath sounds normal.  Abdominal: Soft. Bowel sounds are normal.  Genitourinary:  Genitourinary Comments: No flank tenderness;  indwelling Foley catheter  Musculoskeletal: Normal range of motion.  Neurological: He is alert and oriented to person, place, and time.  Skin: Skin is warm and dry.  Psychiatric: He has a normal mood and affect. His behavior is normal.  Nursing note and vitals reviewed.    ED Treatments / Results  Labs (all labs ordered are listed, but only abnormal results are displayed) Labs Reviewed  CBC WITH DIFFERENTIAL/PLATELET - Abnormal; Notable for the following:       Result Value   WBC 12.8 (*)    Neutro Abs 9.5 (*)    Monocytes Absolute 1.3 (*)    All other  components within normal limits  BASIC METABOLIC PANEL - Abnormal; Notable for the following:    Glucose, Bld 127 (*)    BUN 23 (*)    GFR calc non Af Amer 52 (*)    All other components within normal limits  URINALYSIS, ROUTINE W REFLEX MICROSCOPIC (NOT AT [facility]) - Abnormal; Notable for the following:    APPearance CLOUDY (*)    Hgb urine dipstick LARGE (*)    Protein, ur 30 (*)    Leukocytes, UA LARGE (*)    All other components within normal limits  URINE MICROSCOPIC-ADD ON - Abnormal; Notable for the following:  Squamous Epithelial / LPF 0-5 (*)    Bacteria, UA MANY (*)    All other components within normal limits  POC OCCULT BLOOD, ED - Abnormal; Notable for the following:    Fecal Occult Bld POSITIVE (*)    All other components within normal limits    EKG  EKG Interpretation None       Radiology No results found.  Procedures Procedures (including critical care time)  Medications Ordered in ED Medications  sodium chloride 0.9 % bolus 1,000 mL (1,000 mLs Intravenous New Bag/Given 12/29/15 1540)  cefTRIAXone (ROCEPHIN) 1 g in dextrose 5 % 50 mL IVPB (0 g Intravenous Stopped 12/29/15 1718)     Initial Impression / Assessment and Plan / ED Course  I have reviewed the triage vital signs and the nursing notes.  Pertinent labs & imaging results that were available during my care of the patient were reviewed by me and considered in my medical decision making (see chart for details).  Clinical Course     Patient appears slightly dehydrated. Urinalysis shows infection. IV fluids. IV Rocephin. Admit to general medicine.  Final Clinical Impressions(s) / ED Diagnoses   Final diagnoses:  Urinary tract infection without hematuria, site unspecified    New Prescriptions New Prescriptions   No medications on file     Nat Christen, MD 12/29/15 1812

## 2015-12-30 DIAGNOSIS — Z87440 Personal history of urinary (tract) infections: Secondary | ICD-10-CM | POA: Diagnosis not present

## 2015-12-30 DIAGNOSIS — G9341 Metabolic encephalopathy: Secondary | ICD-10-CM | POA: Diagnosis not present

## 2015-12-30 DIAGNOSIS — R531 Weakness: Secondary | ICD-10-CM | POA: Diagnosis not present

## 2015-12-30 DIAGNOSIS — N390 Urinary tract infection, site not specified: Secondary | ICD-10-CM | POA: Diagnosis not present

## 2015-12-30 LAB — BASIC METABOLIC PANEL
Anion gap: 7 (ref 5–15)
BUN: 19 mg/dL (ref 6–20)
CO2: 26 mmol/L (ref 22–32)
CREATININE: 1.05 mg/dL (ref 0.61–1.24)
Calcium: 9.1 mg/dL (ref 8.9–10.3)
Chloride: 106 mmol/L (ref 101–111)
GFR calc Af Amer: >60 mL/min (ref >60)
Glucose, Bld: 99 mg/dL (ref 65–99)
Potassium: 3.5 mmol/L (ref 3.5–5.1)
SODIUM: 139 mmol/L (ref 135–145)

## 2015-12-30 LAB — URINE CULTURE

## 2015-12-30 LAB — CBC
HCT: 41.7 % (ref 39.0–52.0)
Hemoglobin: 13.5 g/dL (ref 13.0–17.0)
MCH: 29.2 pg (ref 26.0–34.0)
MCHC: 32.4 g/dL (ref 30.0–36.0)
MCV: 90.3 fL (ref 78.0–100.0)
PLATELETS: 211 10*3/uL (ref 150–400)
RBC: 4.62 MIL/uL (ref 4.22–5.81)
RDW: 15.1 % (ref 11.5–15.5)
WBC: 11.9 10*3/uL — AB (ref 4.0–10.5)

## 2015-12-30 MED ORDER — CEFADROXIL 500 MG PO CAPS: 500.0000 mg | ORAL_CAPSULE | Freq: Two times a day (BID) | ORAL | 0 refills | Status: AC

## 2015-12-30 NOTE — Care Management Obs Status (Signed)
[place] NOTIFICATION   Patient Details  Name: MICKAL MENO MRN: [id number] Date of Birth: 05/19/1930   Medicare Observation Status Notification Given:  Yes    Dellie Catholic, RN 12/30/2015, 1:42 PM

## 2015-12-30 NOTE — Evaluation (Signed)
Physical Therapy Evaluation Patient Details Name: [name] MRN: [id number] DOB: October 29, 1930 Today's Date: 12/30/2015   History of Present Illness  82 yo male admitted with UTI, weakness, confusion. Hx of mild dementia, lung ca, HTN, CVA, A fib, chronic catheter, bladder cancer.   Clinical Impression  On eval, pt required Mod assist for mobility. He walked ~30 feet with a RW. High risk for falls with ambulation. Spoke with daughter in room and granddaughter (caretaker) over phone. Granddaughter reports pt has in home care for 9 hours/day and family provides remainder of care, resulting in 24/7 care. Discussed d/c plan-family reports they can continue to provide care at home. They are agreeable to a trial of HHPT (pt has a history of refusing HHPT usually ).    Follow Up Recommendations Home health PT;Supervision/Assistance - 24 hour    Equipment Recommendations  None recommended by PT    Recommendations for Other Services       Precautions / Restrictions Precautions Precautions: Fall Restrictions Weight Bearing Restrictions: No      Mobility  Bed Mobility               General bed mobility comments: oob in recliner  Transfers Overall transfer level: Needs assistance Equipment used: Rolling walker (2 wheeled) Transfers: Sit to/from Stand Sit to Stand: Mod assist         General transfer comment: Assist to rise, stabilize, control descent. Posterior lean with initial standing. Pt also tries to sit before safely positioned in front of sitting surface.   Ambulation/Gait Ambulation/Gait assistance: Mod assist Ambulation Distance (Feet): 30 Feet Assistive device: Rolling walker (2 wheeled) Gait Pattern/deviations: Step-to pattern;Step-through pattern;Decreased stride length;Trunk flexed;Festinating     General Gait Details: Mod cues for safety, distance from walker, step length. Intermittent festination of gait noted. R LE tends to lag behind.   Stairs            Wheelchair Mobility    Modified Rankin (Stroke Patients Only)       Balance Overall balance assessment: Needs assistance           Standing balance-Leahy Scale: Poor                               Pertinent Vitals/Pain Pain Assessment: No/denies pain    Home Living Family/patient expects to be discharged to:: Private residence Living Arrangements: Other (Comment) (granddaughter and her family) Available Help at Discharge: Personal care attendant (9 hours /day) Type of Home: House         Home Equipment: Walker - 4 wheels Additional Comments: spouse and pt live in downstairs apt at granddaughter's home    Prior Function Level of Independence: Needs assistance   Gait / Transfers Assistance Needed: family reports he has been able to ambulate very short distances with RW however typically he will not-mostly transfers  ADL's / Homemaking Assistance Needed: family/aides assist with all ADLs.  [place] Dominance        Extremity/Trunk Assessment   Upper Extremity Assessment: Generalized weakness           Lower Extremity Assessment: Generalized weakness      Cervical / Trunk Assessment: Kyphotic  Communication   Communication: HOH  Cognition Arousal/Alertness: Awake/alert Behavior During Therapy: WFL for tasks assessed/performed Overall Cognitive Status: History of cognitive impairments - at baseline  General Comments      Exercises     Assessment/Plan    PT Assessment All further PT needs can be met in the next venue of care  PT Problem List Decreased strength;Decreased mobility;Decreased activity tolerance;Decreased balance;Decreased cognition;Decreased knowledge of use of DME          PT Treatment Interventions      PT Goals (Current goals can be found in the Care Plan section)  Acute Rehab PT Goals Patient Stated Goal: home PT Goal Formulation: All assessment and education  complete, DC therapy    Frequency     Barriers to discharge        Co-evaluation               End of Session Equipment Utilized During Treatment: Gait belt Activity Tolerance: Patient tolerated treatment well Patient left: in chair;with call bell/phone within reach;with family/visitor present;with chair alarm set      Functional Assessment Tool Used: clinical judgement Functional Limitation: Mobility: Walking and moving around Mobility: Walking and Moving Around Current Status (J4782): At least 20 percent but less than 40 percent impaired, limited or restricted Mobility: Walking and Moving Around Goal Status [phone]): At least 20 percent but less than 40 percent impaired, limited or restricted Mobility: Walking and Moving Around Discharge Status [phone]): At least 20 percent but less than 40 percent impaired, limited or restricted    Time: 1131-1143 PT Time Calculation (min) (ACUTE ONLY): 12 min   Charges:   PT Evaluation $PT Eval Low Complexity: 1 Procedure     PT G Codes:   PT G-Codes **NOT FOR INPATIENT CLASS** Functional Assessment Tool Used: clinical judgement Functional Limitation: Mobility: Walking and moving around Mobility: Walking and Moving Around Current Status (H8469): At least 20 percent but less than 40 percent impaired, limited or restricted Mobility: Walking and Moving Around Goal Status [phone]): At least 20 percent but less than 40 percent impaired, limited or restricted Mobility: Walking and Moving Around Discharge Status [phone]): At least 20 percent but less than 40 percent impaired, limited or restricted    Weston Anna, MPT Pager: [phone]

## 2015-12-30 NOTE — Progress Notes (Signed)
CSW consulted for SNF placement. PN reviewed. PT recommends HHPT / 24 /7 supervision at d/c. RNCM will assist with d/c planning needs.  CSW signing off.  Werner Lean LCSW [phone]

## 2015-12-30 NOTE — Discharge Summary (Addendum)
Physician Discharge Summary  [name] WUJ:[id number] DOB: 09/16/1930 DOA: 12/29/2015  PCP: [name], MD  Admit date: 12/29/2015 Discharge date: 12/30/2015  Admitted From:Home Disposition: Home with home care    Recommendations for Outpatient Follow-up:  1. Follow up with PCP in 1-2 weeks 2. Please obtain BMP/CBC in one week   Home Health: Yes Equipment/Devices: No Discharge Condition: Stable CODE STATUS: DO NOT RESUSCITATE Diet recommendation: Heart healthy  Brief/Interim Summary:82 y.o. male with history of HTN, lung cancer, dementia, CVA, afib and bladder cancer. In 2002 he underwent cystoprostatectomy and ileal neobladder formation by Dr Jeffie Pollock for management of invasive bladder cancer. Patient presented with generalized weakness and confusion.  Patient was found to have urinary tract infection in the setting of chronic indwelling Foley catheter. He may have acute metabolic encephalopathy contributing worsening confusion and generalized weakness in the setting of UTI. The urine culture growing Proteus. Sensitivity reviewed and patient is being discharged with oral Duricef.  I met with patient's wife at bedside and also met his daughter earlier today. As per patient's wife, at baseline patient is alert awake and oriented to place and name only. He is not oriented to time or date as per patient's wife. Today, patient was alert awake and wanted to go home. He was oriented to [facility] and name. I believe he is confusion is likely metabolic encephalopathy in dementia versus progressive worsening dementia. I discussed with the physical therapy this morning for their evaluation. Physical therapy is recommended 24-hour supervision and home care PT. I discussed with the patient's wife at bedside in detail regarding the importance of outpatient follow-up, 24-hour supervision and importance of physical therapy at home.  It sounds like his mental status is around baseline. We  have urine culture and sensitivity available. Already evaluated by physical therapy and case manager to plan for safe discharge. At this time, I'm worried about worsening delirium/confusion if patient has to stay in the hospital longer. After discussion with the patient's family member, we plan to discharge home today with home care.  I also educated patient's family member to follow-up with PCP regarding outpatient referral to rehabilitation or assisted behavioral unit for further care if required.   Discharge Diagnoses:  Active Problems:   History of stroke   Depression   Encephalopathy, metabolic   Dehydration   UTI (urinary tract infection)   History of recurrent UTIs   History of bladder cancer   Subacute confusional state   Generalized weakness   Confusion    Discharge Instructions  Discharge Instructions    Call MD for:  difficulty breathing, headache or visual disturbances    Complete by:  As directed    Call MD for:  hives    Complete by:  As directed    Call MD for:  persistant dizziness or light-headedness    Complete by:  As directed    Call MD for:  persistant nausea and vomiting    Complete by:  As directed    Call MD for:  severe uncontrolled pain    Complete by:  As directed    Call MD for:  temperature >100.4    Complete by:  As directed    Diet - low sodium heart healthy    Complete by:  As directed    Discharge instructions    Complete by:  As directed    Follow up with PCP in 1-2 weeks.   Increase activity slowly    Complete by:  As  directed        Medication List    STOP taking these medications   ciprofloxacin 500 MG tablet Commonly known as:  CIPRO     TAKE these medications   acidophilus Caps capsule Take 1 capsule by mouth daily.   Alpha Lipoic Acid 200 MG Caps Take 200 mg by mouth daily.   amLODipine 5 MG tablet Commonly known as:  NORVASC Take 1 tablet (5 mg total) by mouth daily.   ascorbic acid 1000 MG tablet Commonly known  as:  VITAMIN C Take 1,000 mg by mouth daily.   aspirin EC 81 MG tablet Take 81 mg by mouth daily.   atorvastatin 10 MG tablet Commonly known as:  LIPITOR Take 10 mg by mouth at bedtime.   buPROPion 75 MG tablet Commonly known as:  WELLBUTRIN Take 75 mg by mouth daily.   CALCIUM 1200+D3 600-40-500 MG-MG-UNIT Tb24 Generic drug:  Calcium-Magnesium-Vitamin D Take 1 tablet by mouth daily.   cefadroxil 500 MG capsule Commonly known as:  DURICEF Take 1 capsule (500 mg total) by mouth 2 (two) times daily.   cholecalciferol 1000 units tablet Commonly known as:  VITAMIN D Take 1,000 Units by mouth daily.   esomeprazole 40 MG capsule Commonly known as:  NEXIUM Take 40 mg by mouth daily as needed (for acid reflux).   feeding supplement (ENSURE ENLIVE) Liqd Take 237 mLs by mouth daily.   hydrocortisone valerate cream 0.2 % Commonly known as:  WESTCORT Apply 1 application topically 2 (two) times daily as needed (for itching/irritation).   Melatonin 5 MG Tabs Take 5 mg by mouth at bedtime.   metoprolol succinate 25 MG 24 hr tablet Commonly known as:  TOPROL-XL Take 25 mg by mouth daily.   sertraline 100 MG tablet Commonly known as:  ZOLOFT Take 100 mg by mouth daily.   vitamin B-12 1000 MCG tablet Commonly known as:  CYANOCOBALAMIN Take 1,000 mcg by mouth daily.   zolpidem 5 MG tablet Commonly known as:  AMBIEN Take 5 mg by mouth at bedtime.      Follow-up Information    [name], MD. Schedule an appointment as soon as possible for a visit in 1 week(s).   Specialty:  Internal Medicine Contact information: [place] [address] [phone]          Allergies  Allergen Reactions  . Sulfa Antibiotics Nausea And Vomiting  . Sulfonamide Derivatives Nausea And Vomiting    Consultations: None  Procedures/Studies: None  Subjective: Patient was seen and examined at bedside. Patient was alert awake and asking to go home today. He was  oriented to [facility] and his name. He denied headache or mild dizziness, nausea, vomiting, chest pain, shortness of breath. I think the review of system is unreliable because of his dementia.   Discharge Exam: Vitals:   12/30/15 0604 12/30/15 0949  BP: (!) 157/101 107/69  Pulse: 86 61  Resp: 16 16  Temp: 97.6 F (36.4 C) 98.2 F (36.8 C)   Vitals:   12/29/15 2109 12/30/15 0604 12/30/15 0949 12/30/15 1113  BP: (!) 141/86 (!) 157/101 107/69   Pulse: 84 86 61   Resp: '16 16 16   ' Temp: 98 F (36.7 C) 97.6 F (36.4 C) 98.2 F (36.8 C)   TempSrc: Oral Oral Oral   SpO2: 95% 99% 95%   Weight:    86.2 kg (190 lb 1.6 oz)  Height:        General: Pleasant elderly  male sitting on chair. Cardiovascular: RRR, S1/S2 +, no rubs, no gallops Respiratory: CTA bilaterally, no wheezing, no rhonchi Abdominal: Soft, NT, ND, bowel sounds + Extremities: no edema, no cyanosis Psychiatrically: Alert awake and oriented to [facility] and names only   The results of significant diagnostics from this hospitalization (including imaging, microbiology, ancillary and laboratory) are listed below for reference.     Microbiology: Recent Results (from the past 240 hour(s))  Urine culture     Status: Abnormal   Collection Time: 12/26/15  8:43 PM  Result Value Ref Range Status   Specimen Description URINE, CATHETERIZED  Final   Special Requests NONE  Final   Culture >=100,000 COLONIES/mL PROTEUS MIRABILIS (A)  Final   Report Status 12/30/2015 FINAL  Final   Organism ID, Bacteria PROTEUS MIRABILIS (A)  Final      Susceptibility   Proteus mirabilis - MIC*    AMPICILLIN 4 SENSITIVE Sensitive     CEFAZOLIN <=4 SENSITIVE Sensitive     CEFTRIAXONE <=1 SENSITIVE Sensitive     CIPROFLOXACIN >=4 RESISTANT Resistant     GENTAMICIN <=1 SENSITIVE Sensitive     IMIPENEM <=0.25 SENSITIVE Sensitive     NITROFURANTOIN >=512 RESISTANT Resistant     TRIMETH/SULFA <=20 SENSITIVE Sensitive      AMPICILLIN/SULBACTAM <=2 SENSITIVE Sensitive     PIP/TAZO <=4 SENSITIVE Sensitive     * >=100,000 COLONIES/mL PROTEUS MIRABILIS     Labs: BNP (last 3 results) No results for input(s): BNP in the last 8760 hours. Basic Metabolic Panel:  Recent Labs Lab 12/26/15 2025 12/29/15 1505 12/30/15 0431  NA 144 139 139  K 4.1 4.2 3.5  CL 109 105 106  CO2 '28 28 26  ' GLUCOSE 117* 127* 99  BUN 29* 23* 19  CREATININE 1.29* 1.24 1.05  CALCIUM 10.2 9.6 9.1   Liver Function Tests: No results for input(s): AST, ALT, ALKPHOS, BILITOT, PROT, ALBUMIN in the last 168 hours. No results for input(s): LIPASE, AMYLASE in the last 168 hours. No results for input(s): AMMONIA in the last 168 hours. CBC:  Recent Labs Lab 12/26/15 2025 12/29/15 1505 12/30/15 0431  WBC 17.9* 12.8* 11.9*  NEUTROABS 14.7* 9.5*  --   HGB 15.1 14.4 13.5  HCT 46.8 44.9 41.7  MCV 90.9 90.9 90.3  PLT 190 211 211   Cardiac Enzymes: No results for input(s): CKTOTAL, CKMB, CKMBINDEX, TROPONINI in the last 168 hours. BNP: Invalid input(s): POCBNP CBG: No results for input(s): GLUCAP in the last 168 hours. D-Dimer No results for input(s): DDIMER in the last 72 hours. Hgb A1c No results for input(s): HGBA1C in the last 72 hours. Lipid Profile No results for input(s): CHOL, HDL, LDLCALC, TRIG, CHOLHDL, LDLDIRECT in the last 72 hours. Thyroid function studies No results for input(s): TSH, T4TOTAL, T3FREE, THYROIDAB in the last 72 hours.  Invalid input(s): FREET3 Anemia work up No results for input(s): VITAMINB12, FOLATE, FERRITIN, TIBC, IRON, RETICCTPCT in the last 72 hours. Urinalysis    Component Value Date/Time   COLORURINE YELLOW 12/29/2015 1523   APPEARANCEUR CLOUDY (A) 12/29/2015 1523   LABSPEC 1.012 12/29/2015 1523   PHURINE 6.5 12/29/2015 1523   GLUCOSEU NEGATIVE 12/29/2015 1523   HGBUR LARGE (A) 12/29/2015 1523   BILIRUBINUR NEGATIVE 12/29/2015 1523   KETONESUR NEGATIVE 12/29/2015 1523   PROTEINUR 30  (A) 12/29/2015 1523   UROBILINOGEN 0.2 11/07/2014 1502   NITRITE NEGATIVE 12/29/2015 1523   LEUKOCYTESUR LARGE (A) 12/29/2015 1523   Sepsis Labs Invalid input(s): PROCALCITONIN,  WBC,  LACTICIDVEN Microbiology Recent Results (from the past 240 hour(s))  Urine culture     Status: Abnormal   Collection Time: 12/26/15  8:43 PM  Result Value Ref Range Status   Specimen Description URINE, CATHETERIZED  Final   Special Requests NONE  Final   Culture >=100,000 COLONIES/mL PROTEUS MIRABILIS (A)  Final   Report Status 12/30/2015 FINAL  Final   Organism ID, Bacteria PROTEUS MIRABILIS (A)  Final      Susceptibility   Proteus mirabilis - MIC*    AMPICILLIN 4 SENSITIVE Sensitive     CEFAZOLIN <=4 SENSITIVE Sensitive     CEFTRIAXONE <=1 SENSITIVE Sensitive     CIPROFLOXACIN >=4 RESISTANT Resistant     GENTAMICIN <=1 SENSITIVE Sensitive     IMIPENEM <=0.25 SENSITIVE Sensitive     NITROFURANTOIN >=512 RESISTANT Resistant     TRIMETH/SULFA <=20 SENSITIVE Sensitive     AMPICILLIN/SULBACTAM <=2 SENSITIVE Sensitive     PIP/TAZO <=4 SENSITIVE Sensitive     * >=100,000 COLONIES/mL PROTEUS MIRABILIS     Time coordinating discharge: Over 30 minutes  SIGNED:   Rosita Fire, MD  [facility] 12/30/2015, 2:33 PM  If 7PM-7AM, please contact night-coverage [email or web address] Password [email or web address]

## 2015-12-30 NOTE — Progress Notes (Addendum)
CM was approached by RN as the spouse does not live with pt; pt lives with granddaughter, [name] [phone] and chooses [facility] to render HHPT/Aide.  Referral called to [facility] rep, with explanation of family dynamic.  No other CM needs were communicated.  Cm spoke with pt and spouse in room for choice of home health agency. Family has declined all [place] services as they say they have a caretaker who will walk with pt to regain strength.  Family state they have all DME needed at home and deny need for additional DME.  No other needs were communicated.

## 2016-01-01 DIAGNOSIS — Z87891 Personal history of nicotine dependence: Secondary | ICD-10-CM | POA: Diagnosis not present

## 2016-01-01 DIAGNOSIS — Z960 Presence of urogenital implants: Secondary | ICD-10-CM | POA: Diagnosis not present

## 2016-01-01 DIAGNOSIS — Z8673 Personal history of transient ischemic attack (TIA), and cerebral infarction without residual deficits: Secondary | ICD-10-CM | POA: Diagnosis not present

## 2016-01-01 DIAGNOSIS — F0390 Unspecified dementia without behavioral disturbance: Secondary | ICD-10-CM | POA: Diagnosis not present

## 2016-01-01 DIAGNOSIS — E860 Dehydration: Secondary | ICD-10-CM | POA: Diagnosis not present

## 2016-01-01 DIAGNOSIS — Z9181 History of falling: Secondary | ICD-10-CM | POA: Diagnosis not present

## 2016-01-01 DIAGNOSIS — J449 Chronic obstructive pulmonary disease, unspecified: Secondary | ICD-10-CM | POA: Diagnosis not present

## 2016-01-01 DIAGNOSIS — I10 Essential (primary) hypertension: Secondary | ICD-10-CM | POA: Diagnosis not present

## 2016-01-01 DIAGNOSIS — Z8551 Personal history of malignant neoplasm of bladder: Secondary | ICD-10-CM | POA: Diagnosis not present

## 2016-01-01 DIAGNOSIS — N390 Urinary tract infection, site not specified: Secondary | ICD-10-CM | POA: Diagnosis not present

## 2016-01-01 DIAGNOSIS — Z87440 Personal history of urinary (tract) infections: Secondary | ICD-10-CM | POA: Diagnosis not present

## 2016-01-01 DIAGNOSIS — Z902 Acquired absence of lung [part of]: Secondary | ICD-10-CM | POA: Diagnosis not present

## 2016-01-01 DIAGNOSIS — I4891 Unspecified atrial fibrillation: Secondary | ICD-10-CM | POA: Diagnosis not present

## 2016-01-01 DIAGNOSIS — F329 Major depressive disorder, single episode, unspecified: Secondary | ICD-10-CM | POA: Diagnosis not present

## 2016-01-01 DIAGNOSIS — Z85118 Personal history of other malignant neoplasm of bronchus and lung: Secondary | ICD-10-CM | POA: Diagnosis not present

## 2016-01-01 DIAGNOSIS — G9341 Metabolic encephalopathy: Secondary | ICD-10-CM | POA: Diagnosis not present

## 2016-01-04 DIAGNOSIS — I10 Essential (primary) hypertension: Secondary | ICD-10-CM | POA: Diagnosis not present

## 2016-01-04 DIAGNOSIS — F0390 Unspecified dementia without behavioral disturbance: Secondary | ICD-10-CM | POA: Diagnosis not present

## 2016-01-04 DIAGNOSIS — E860 Dehydration: Secondary | ICD-10-CM | POA: Diagnosis not present

## 2016-01-04 DIAGNOSIS — G9341 Metabolic encephalopathy: Secondary | ICD-10-CM | POA: Diagnosis not present

## 2016-01-04 DIAGNOSIS — N390 Urinary tract infection, site not specified: Secondary | ICD-10-CM | POA: Diagnosis not present

## 2016-01-04 DIAGNOSIS — J449 Chronic obstructive pulmonary disease, unspecified: Secondary | ICD-10-CM | POA: Diagnosis not present

## 2016-01-06 DIAGNOSIS — J449 Chronic obstructive pulmonary disease, unspecified: Secondary | ICD-10-CM | POA: Diagnosis not present

## 2016-01-06 DIAGNOSIS — E860 Dehydration: Secondary | ICD-10-CM | POA: Diagnosis not present

## 2016-01-06 DIAGNOSIS — N390 Urinary tract infection, site not specified: Secondary | ICD-10-CM | POA: Diagnosis not present

## 2016-01-06 DIAGNOSIS — F0390 Unspecified dementia without behavioral disturbance: Secondary | ICD-10-CM | POA: Diagnosis not present

## 2016-01-06 DIAGNOSIS — I10 Essential (primary) hypertension: Secondary | ICD-10-CM | POA: Diagnosis not present

## 2016-01-06 DIAGNOSIS — G9341 Metabolic encephalopathy: Secondary | ICD-10-CM | POA: Diagnosis not present

## 2016-01-08 DIAGNOSIS — J449 Chronic obstructive pulmonary disease, unspecified: Secondary | ICD-10-CM | POA: Diagnosis not present

## 2016-01-08 DIAGNOSIS — I10 Essential (primary) hypertension: Secondary | ICD-10-CM | POA: Diagnosis not present

## 2016-01-08 DIAGNOSIS — G9341 Metabolic encephalopathy: Secondary | ICD-10-CM | POA: Diagnosis not present

## 2016-01-08 DIAGNOSIS — N390 Urinary tract infection, site not specified: Secondary | ICD-10-CM | POA: Diagnosis not present

## 2016-01-08 DIAGNOSIS — F0390 Unspecified dementia without behavioral disturbance: Secondary | ICD-10-CM | POA: Diagnosis not present

## 2016-01-08 DIAGNOSIS — E860 Dehydration: Secondary | ICD-10-CM | POA: Diagnosis not present

## 2016-01-12 DIAGNOSIS — N390 Urinary tract infection, site not specified: Secondary | ICD-10-CM | POA: Diagnosis not present

## 2016-01-12 DIAGNOSIS — E860 Dehydration: Secondary | ICD-10-CM | POA: Diagnosis not present

## 2016-01-12 DIAGNOSIS — F0390 Unspecified dementia without behavioral disturbance: Secondary | ICD-10-CM | POA: Diagnosis not present

## 2016-01-12 DIAGNOSIS — I10 Essential (primary) hypertension: Secondary | ICD-10-CM | POA: Diagnosis not present

## 2016-01-12 DIAGNOSIS — G9341 Metabolic encephalopathy: Secondary | ICD-10-CM | POA: Diagnosis not present

## 2016-01-12 DIAGNOSIS — J449 Chronic obstructive pulmonary disease, unspecified: Secondary | ICD-10-CM | POA: Diagnosis not present

## 2016-01-13 DIAGNOSIS — R627 Adult failure to thrive: Secondary | ICD-10-CM | POA: Diagnosis not present

## 2016-01-13 DIAGNOSIS — R531 Weakness: Secondary | ICD-10-CM | POA: Diagnosis not present

## 2016-01-13 DIAGNOSIS — R7301 Impaired fasting glucose: Secondary | ICD-10-CM | POA: Diagnosis not present

## 2016-01-13 DIAGNOSIS — N390 Urinary tract infection, site not specified: Secondary | ICD-10-CM | POA: Diagnosis not present

## 2016-01-19 DIAGNOSIS — E860 Dehydration: Secondary | ICD-10-CM | POA: Diagnosis not present

## 2016-01-19 DIAGNOSIS — I10 Essential (primary) hypertension: Secondary | ICD-10-CM | POA: Diagnosis not present

## 2016-01-19 DIAGNOSIS — F0390 Unspecified dementia without behavioral disturbance: Secondary | ICD-10-CM | POA: Diagnosis not present

## 2016-01-19 DIAGNOSIS — G9341 Metabolic encephalopathy: Secondary | ICD-10-CM | POA: Diagnosis not present

## 2016-01-19 DIAGNOSIS — N390 Urinary tract infection, site not specified: Secondary | ICD-10-CM | POA: Diagnosis not present

## 2016-01-19 DIAGNOSIS — J449 Chronic obstructive pulmonary disease, unspecified: Secondary | ICD-10-CM | POA: Diagnosis not present

## 2016-01-21 DIAGNOSIS — F0390 Unspecified dementia without behavioral disturbance: Secondary | ICD-10-CM | POA: Diagnosis not present

## 2016-01-21 DIAGNOSIS — J449 Chronic obstructive pulmonary disease, unspecified: Secondary | ICD-10-CM | POA: Diagnosis not present

## 2016-01-21 DIAGNOSIS — I10 Essential (primary) hypertension: Secondary | ICD-10-CM | POA: Diagnosis not present

## 2016-01-21 DIAGNOSIS — G9341 Metabolic encephalopathy: Secondary | ICD-10-CM | POA: Diagnosis not present

## 2016-01-21 DIAGNOSIS — N390 Urinary tract infection, site not specified: Secondary | ICD-10-CM | POA: Diagnosis not present

## 2016-01-21 DIAGNOSIS — E860 Dehydration: Secondary | ICD-10-CM | POA: Diagnosis not present

## 2016-01-27 DIAGNOSIS — F0390 Unspecified dementia without behavioral disturbance: Secondary | ICD-10-CM | POA: Diagnosis not present

## 2016-01-27 DIAGNOSIS — E860 Dehydration: Secondary | ICD-10-CM | POA: Diagnosis not present

## 2016-01-27 DIAGNOSIS — G9341 Metabolic encephalopathy: Secondary | ICD-10-CM | POA: Diagnosis not present

## 2016-01-27 DIAGNOSIS — N390 Urinary tract infection, site not specified: Secondary | ICD-10-CM | POA: Diagnosis not present

## 2016-01-27 DIAGNOSIS — J449 Chronic obstructive pulmonary disease, unspecified: Secondary | ICD-10-CM | POA: Diagnosis not present

## 2016-01-27 DIAGNOSIS — I10 Essential (primary) hypertension: Secondary | ICD-10-CM | POA: Diagnosis not present

## 2016-02-02 DIAGNOSIS — R3914 Feeling of incomplete bladder emptying: Secondary | ICD-10-CM | POA: Diagnosis not present

## 2016-02-02 DIAGNOSIS — C670 Malignant neoplasm of trigone of bladder: Secondary | ICD-10-CM | POA: Diagnosis not present

## 2016-02-14 ENCOUNTER — Inpatient Hospital Stay (HOSPITAL_COMMUNITY)
Admission: EM | Admit: 2016-02-14 | Discharge: 2016-02-20 | DRG: 698 | Disposition: A | Discharge status: Skilled Nursing Facility | Payer: Medicare Other | Attending: Internal Medicine | Admitting: Internal Medicine

## 2016-02-14 ENCOUNTER — Emergency Department (HOSPITAL_COMMUNITY): Payer: Medicare Other

## 2016-02-14 ENCOUNTER — Encounter (HOSPITAL_COMMUNITY): Payer: Self-pay

## 2016-02-14 ENCOUNTER — Observation Stay (HOSPITAL_COMMUNITY): Payer: Medicare Other

## 2016-02-14 DIAGNOSIS — Z79899 Other long term (current) drug therapy: Secondary | ICD-10-CM

## 2016-02-14 DIAGNOSIS — G9341 Metabolic encephalopathy: Secondary | ICD-10-CM | POA: Diagnosis present

## 2016-02-14 DIAGNOSIS — W1839XA Other fall on same level, initial encounter: Secondary | ICD-10-CM | POA: Diagnosis present

## 2016-02-14 DIAGNOSIS — S59902A Unspecified injury of left elbow, initial encounter: Secondary | ICD-10-CM | POA: Diagnosis not present

## 2016-02-14 DIAGNOSIS — Y846 Urinary catheterization as the cause of abnormal reaction of the patient, or of later complication, without mention of misadventure at the time of the procedure: Secondary | ICD-10-CM | POA: Diagnosis present

## 2016-02-14 DIAGNOSIS — Z9079 Acquired absence of other genital organ(s): Secondary | ICD-10-CM

## 2016-02-14 DIAGNOSIS — Z906 Acquired absence of other parts of urinary tract: Secondary | ICD-10-CM

## 2016-02-14 DIAGNOSIS — M546 Pain in thoracic spine: Secondary | ICD-10-CM | POA: Diagnosis not present

## 2016-02-14 DIAGNOSIS — Z87440 Personal history of urinary (tract) infections: Secondary | ICD-10-CM

## 2016-02-14 DIAGNOSIS — Z8551 Personal history of malignant neoplasm of bladder: Secondary | ICD-10-CM

## 2016-02-14 DIAGNOSIS — F329 Major depressive disorder, single episode, unspecified: Secondary | ICD-10-CM | POA: Diagnosis present

## 2016-02-14 DIAGNOSIS — L8990 Pressure ulcer of unspecified site, unspecified stage: Secondary | ICD-10-CM | POA: Insufficient documentation

## 2016-02-14 DIAGNOSIS — M8448XA Pathological fracture, other site, initial encounter for fracture: Secondary | ICD-10-CM | POA: Diagnosis present

## 2016-02-14 DIAGNOSIS — Z8673 Personal history of transient ischemic attack (TIA), and cerebral infarction without residual deficits: Secondary | ICD-10-CM

## 2016-02-14 DIAGNOSIS — R52 Pain, unspecified: Secondary | ICD-10-CM

## 2016-02-14 DIAGNOSIS — Z9842 Cataract extraction status, left eye: Secondary | ICD-10-CM

## 2016-02-14 DIAGNOSIS — I4891 Unspecified atrial fibrillation: Secondary | ICD-10-CM | POA: Diagnosis not present

## 2016-02-14 DIAGNOSIS — S299XXA Unspecified injury of thorax, initial encounter: Secondary | ICD-10-CM | POA: Diagnosis not present

## 2016-02-14 DIAGNOSIS — Z66 Do not resuscitate: Secondary | ICD-10-CM | POA: Diagnosis present

## 2016-02-14 DIAGNOSIS — S22080A Wedge compression fracture of T11-T12 vertebra, initial encounter for closed fracture: Secondary | ICD-10-CM | POA: Diagnosis not present

## 2016-02-14 DIAGNOSIS — Z809 Family history of malignant neoplasm, unspecified: Secondary | ICD-10-CM

## 2016-02-14 DIAGNOSIS — R4182 Altered mental status, unspecified: Secondary | ICD-10-CM | POA: Diagnosis not present

## 2016-02-14 DIAGNOSIS — M545 Low back pain, unspecified: Secondary | ICD-10-CM | POA: Diagnosis present

## 2016-02-14 DIAGNOSIS — R55 Syncope and collapse: Secondary | ICD-10-CM | POA: Diagnosis not present

## 2016-02-14 DIAGNOSIS — Z1624 Resistance to multiple antibiotics: Secondary | ICD-10-CM | POA: Diagnosis present

## 2016-02-14 DIAGNOSIS — R296 Repeated falls: Secondary | ICD-10-CM | POA: Diagnosis present

## 2016-02-14 DIAGNOSIS — T83518A Infection and inflammatory reaction due to other urinary catheter, initial encounter: Secondary | ICD-10-CM | POA: Diagnosis not present

## 2016-02-14 DIAGNOSIS — Z823 Family history of stroke: Secondary | ICD-10-CM

## 2016-02-14 DIAGNOSIS — Z515 Encounter for palliative care: Secondary | ICD-10-CM

## 2016-02-14 DIAGNOSIS — Y92009 Unspecified place in unspecified non-institutional (private) residence as the place of occurrence of the external cause: Secondary | ICD-10-CM

## 2016-02-14 DIAGNOSIS — Z882 Allergy status to sulfonamides status: Secondary | ICD-10-CM

## 2016-02-14 DIAGNOSIS — S22000A Wedge compression fracture of unspecified thoracic vertebra, initial encounter for closed fracture: Secondary | ICD-10-CM

## 2016-02-14 DIAGNOSIS — Z9841 Cataract extraction status, right eye: Secondary | ICD-10-CM

## 2016-02-14 DIAGNOSIS — T83511A Infection and inflammatory reaction due to indwelling urethral catheter, initial encounter: Secondary | ICD-10-CM | POA: Diagnosis not present

## 2016-02-14 DIAGNOSIS — N390 Urinary tract infection, site not specified: Secondary | ICD-10-CM | POA: Diagnosis present

## 2016-02-14 DIAGNOSIS — S0990XA Unspecified injury of head, initial encounter: Secondary | ICD-10-CM | POA: Diagnosis not present

## 2016-02-14 DIAGNOSIS — Z85118 Personal history of other malignant neoplasm of bronchus and lung: Secondary | ICD-10-CM

## 2016-02-14 DIAGNOSIS — I10 Essential (primary) hypertension: Secondary | ICD-10-CM | POA: Diagnosis present

## 2016-02-14 DIAGNOSIS — Z87891 Personal history of nicotine dependence: Secondary | ICD-10-CM

## 2016-02-14 DIAGNOSIS — E785 Hyperlipidemia, unspecified: Secondary | ICD-10-CM | POA: Diagnosis present

## 2016-02-14 DIAGNOSIS — R079 Chest pain, unspecified: Secondary | ICD-10-CM | POA: Diagnosis not present

## 2016-02-14 DIAGNOSIS — R410 Disorientation, unspecified: Secondary | ICD-10-CM | POA: Diagnosis not present

## 2016-02-14 DIAGNOSIS — W19XXXA Unspecified fall, initial encounter: Secondary | ICD-10-CM | POA: Diagnosis not present

## 2016-02-14 DIAGNOSIS — R441 Visual hallucinations: Secondary | ICD-10-CM | POA: Diagnosis present

## 2016-02-14 DIAGNOSIS — Z7401 Bed confinement status: Secondary | ICD-10-CM

## 2016-02-14 DIAGNOSIS — T148XXA Other injury of unspecified body region, initial encounter: Secondary | ICD-10-CM | POA: Diagnosis not present

## 2016-02-14 DIAGNOSIS — I482 Chronic atrial fibrillation: Secondary | ICD-10-CM | POA: Diagnosis not present

## 2016-02-14 DIAGNOSIS — Z7189 Other specified counseling: Secondary | ICD-10-CM

## 2016-02-14 DIAGNOSIS — Z7982 Long term (current) use of aspirin: Secondary | ICD-10-CM

## 2016-02-14 DIAGNOSIS — F32A Depression, unspecified: Secondary | ICD-10-CM | POA: Diagnosis present

## 2016-02-14 DIAGNOSIS — S199XXA Unspecified injury of neck, initial encounter: Secondary | ICD-10-CM | POA: Diagnosis not present

## 2016-02-14 DIAGNOSIS — F0150 Vascular dementia without behavioral disturbance: Secondary | ICD-10-CM

## 2016-02-14 LAB — CBC WITH DIFFERENTIAL/PLATELET
BASOS ABS: 0 10*3/uL (ref 0.0–0.1)
BASOS PCT: 0 %
Eosinophils Absolute: 0.3 10*3/uL (ref 0.0–0.7)
Eosinophils Relative: 2 %
HEMATOCRIT: 43.5 % (ref 39.0–52.0)
HEMOGLOBIN: 14.1 g/dL (ref 13.0–17.0)
LYMPHS PCT: 10 %
Lymphs Abs: 1.2 10*3/uL (ref 0.7–4.0)
MCH: 29.5 pg (ref 26.0–34.0)
MCHC: 32.4 g/dL (ref 30.0–36.0)
MCV: 91 fL (ref 78.0–100.0)
MONO ABS: 1.1 10*3/uL — AB (ref 0.1–1.0)
Monocytes Relative: 9 %
NEUTROS ABS: 9.6 10*3/uL — AB (ref 1.7–7.7)
NEUTROS PCT: 79 %
Platelets: 167 10*3/uL (ref 150–400)
RBC: 4.78 MIL/uL (ref 4.22–5.81)
RDW: 14.7 % (ref 11.5–15.5)
WBC: 12.2 10*3/uL — AB (ref 4.0–10.5)

## 2016-02-14 LAB — URINALYSIS, ROUTINE W REFLEX MICROSCOPIC
Bilirubin Urine: NEGATIVE
Glucose, UA: NEGATIVE mg/dL
Ketones, ur: NEGATIVE mg/dL
NITRITE: POSITIVE — AB
Protein, ur: 100 mg/dL — AB
SPECIFIC GRAVITY, URINE: 1.011 (ref 1.005–1.030)
pH: 7 (ref 5.0–8.0)

## 2016-02-14 LAB — COMPREHENSIVE METABOLIC PANEL
ALK PHOS: 47 U/L (ref 38–126)
ALT: 12 U/L — ABNORMAL LOW (ref 17–63)
ANION GAP: 7 (ref 5–15)
AST: 17 U/L (ref 15–41)
Albumin: 3.6 g/dL (ref 3.5–5.0)
BILIRUBIN TOTAL: 1 mg/dL (ref 0.3–1.2)
BUN: 21 mg/dL — ABNORMAL HIGH (ref 6–20)
CALCIUM: 8.7 mg/dL — AB (ref 8.9–10.3)
CO2: 26 mmol/L (ref 22–32)
CREATININE: 1.15 mg/dL (ref 0.61–1.24)
Chloride: 107 mmol/L (ref 101–111)
GFR calc non Af Amer: 57 mL/min — ABNORMAL LOW (ref >60)
Glucose, Bld: 104 mg/dL — ABNORMAL HIGH (ref 65–99)
Potassium: 3.8 mmol/L (ref 3.5–5.1)
Sodium: 140 mmol/L (ref 135–145)
TOTAL PROTEIN: 6.3 g/dL — AB (ref 6.5–8.1)

## 2016-02-14 LAB — I-STAT CG4 LACTIC ACID, ED: LACTIC ACID, VENOUS: 0.83 mmol/L (ref 0.5–1.9)

## 2016-02-14 MED ORDER — VANCOMYCIN HCL IN DEXTROSE 1-5 GM/200ML-% IV SOLN
1000.0000 mg | Freq: Once | INTRAVENOUS | Status: AC
  Administered 2016-02-14: 1000 mg via INTRAVENOUS
  Filled 2016-02-14: qty 200

## 2016-02-14 MED ORDER — AMLODIPINE BESYLATE 5 MG PO TABS
5.0000 mg | ORAL_TABLET | Freq: Every day | ORAL | Status: DC
  Administered 2016-02-14 – 2016-02-20 (×7): 5 mg via ORAL
  Filled 2016-02-14 (×7): qty 1

## 2016-02-14 MED ORDER — ENOXAPARIN SODIUM 40 MG/0.4ML ~~LOC~~ SOLN
40.0000 mg | Freq: Every day | SUBCUTANEOUS | Status: DC
  Administered 2016-02-14 – 2016-02-19 (×6): 40 mg via SUBCUTANEOUS
  Filled 2016-02-14 (×6): qty 0.4

## 2016-02-14 MED ORDER — ATORVASTATIN CALCIUM 10 MG PO TABS
10.0000 mg | ORAL_TABLET | Freq: Every day | ORAL | Status: DC
  Administered 2016-02-14 – 2016-02-19 (×6): 10 mg via ORAL
  Filled 2016-02-14 (×6): qty 1

## 2016-02-14 MED ORDER — DEXTROSE 5 % IV SOLN
1.0000 g | Freq: Once | INTRAVENOUS | Status: AC
  Administered 2016-02-14: 1 g via INTRAVENOUS
  Filled 2016-02-14: qty 1

## 2016-02-14 MED ORDER — HYDROCORTISONE 1 % EX CREA: 1.0000 "application " | TOPICAL_CREAM | Freq: Two times a day (BID) | CUTANEOUS | Status: DC | PRN

## 2016-02-14 MED ORDER — PHENAZOPYRIDINE HCL 100 MG PO TABS
100.0000 mg | ORAL_TABLET | Freq: Three times a day (TID) | ORAL | Status: DC
  Administered 2016-02-14 – 2016-02-19 (×14): 100 mg via ORAL
  Filled 2016-02-14 (×15): qty 1

## 2016-02-14 MED ORDER — ASPIRIN EC 81 MG PO TBEC
81.0000 mg | DELAYED_RELEASE_TABLET | Freq: Every day | ORAL | Status: DC
  Administered 2016-02-14 – 2016-02-20 (×7): 81 mg via ORAL
  Filled 2016-02-14 (×7): qty 1

## 2016-02-14 MED ORDER — HYDROCODONE-ACETAMINOPHEN 5-325 MG PO TABS
1.0000 | ORAL_TABLET | Freq: Four times a day (QID) | ORAL | Status: DC | PRN
  Administered 2016-02-14 – 2016-02-15 (×2): 1 via ORAL
  Filled 2016-02-14 (×2): qty 1

## 2016-02-14 MED ORDER — SACCHAROMYCES BOULARDII 250 MG PO CAPS
460.0000 mg | ORAL_CAPSULE | Freq: Every day | ORAL | Status: DC
  Administered 2016-02-14 – 2016-02-20 (×7): 500 mg via ORAL
  Filled 2016-02-14 (×7): qty 2

## 2016-02-14 MED ORDER — OXYCODONE-ACETAMINOPHEN 5-325 MG PO TABS
1.0000 | ORAL_TABLET | Freq: Once | ORAL | Status: AC
  Administered 2016-02-14: 1 via ORAL
  Filled 2016-02-14: qty 1

## 2016-02-14 MED ORDER — SERTRALINE HCL 100 MG PO TABS
100.0000 mg | ORAL_TABLET | Freq: Every day | ORAL | Status: DC
  Administered 2016-02-14 – 2016-02-20 (×7): 100 mg via ORAL
  Filled 2016-02-14 (×7): qty 1

## 2016-02-14 MED ORDER — ENSURE ENLIVE PO LIQD
237.0000 mL | Freq: Every day | ORAL | Status: DC
  Administered 2016-02-14 – 2016-02-20 (×7): 237 mL via ORAL

## 2016-02-14 MED ORDER — BUPROPION HCL 75 MG PO TABS
75.0000 mg | ORAL_TABLET | Freq: Every day | ORAL | Status: DC
  Administered 2016-02-15 – 2016-02-20 (×6): 75 mg via ORAL
  Filled 2016-02-14 (×6): qty 1

## 2016-02-14 MED ORDER — VANCOMYCIN HCL IN DEXTROSE 750-5 MG/150ML-% IV SOLN: 750.0000 mg | Freq: Two times a day (BID) | INTRAVENOUS | Status: DC

## 2016-02-14 MED ORDER — PANTOPRAZOLE SODIUM 40 MG PO TBEC: 80.0000 mg | DELAYED_RELEASE_TABLET | Freq: Every day | ORAL | Status: DC | PRN

## 2016-02-14 MED ORDER — DEXTROSE 5 % IV SOLN
1.0000 g | INTRAVENOUS | Status: DC
  Administered 2016-02-15: 1 g via INTRAVENOUS
  Filled 2016-02-14: qty 10

## 2016-02-14 MED ORDER — ACETAMINOPHEN 325 MG PO TABS
650.0000 mg | ORAL_TABLET | Freq: Four times a day (QID) | ORAL | Status: DC | PRN
  Filled 2016-02-14: qty 2

## 2016-02-14 NOTE — ED Triage Notes (Signed)
PT RECEIVED FROM HOME VIA EMS FOR A FALL 2 DAYS AGO. PER EMS, THE FAMILY THINKS HE MAY HAVE THOUGHT HIS WIFE WAS HOME WITH HIM, AND WENT TO CHECK ON HER WHEN HE FELL OUT OF THE BED. PT HAS A HX OF DEMENTIA AND A PROSTHETIC BLADDER, AND IS PRONE TO RECURRENT UTI. FAMILY DENIES HEAD INJURY OR LOC. PT HAS AN ABRASION TO THE LEFT ELBOW. PT IS C/O LOWER BACK PAIN.

## 2016-02-14 NOTE — ED Notes (Signed)
Patient transported to CT 

## 2016-02-14 NOTE — ED Notes (Signed)
Bed: WA20 Expected date:  Expected time:  Means of arrival:  Comments: Ems fall/fever

## 2016-02-14 NOTE — ED Provider Notes (Signed)
[facility] DEPT Provider Note   CSN: [id number] Arrival date & time: 02/14/16  1120     History   Chief Complaint Chief Complaint  Patient presents with  . Fall    HPI [name] is a 82 y.o. male.  HPI  82 year old male with a history hypertension, lung cancer, dementia, CVA, atrial fibrillation, bladder cancer who underwent cystoprostatectomy with ileal neobladder with chronic indwelling Foley, admission in November for concern of altered mental status and UTI, who presents with concern for increased confusion, foul-smelling urine and fall 2 days ago with back pain.  Reports he was confused 2 nights ago, thinking somebody was in the bed next to him, and tried to stand up from bed and fell. He reports that he initially seemed to be okay and so they did not bring him in for initial evaluation. Also reported he had a fall the day before that where he did hit his head. He's had severe back pain since the fall, and on patient's history this is his only concern. Patient reports in middle back pain. No new numbness or weakness, no headache. Family reports he had one episode of vomiting and nausea yesterday which they were not sure if it is associated with pain or something else.   Daughter is concerned that he has been essentially urinary tract infection as the urine has began to smell foul, and also states he had episode of emesis, and some increased confusion.   Past Medical History:  Diagnosis Date  . Atrial fibrillation ([place])   . Bladder cancer ([facility])    hx of surgery and chemo  . CVA (cerebral infarction)   . Dementia   . Depression 08/19/2014  . Diverticulitis   . Hyperlipidemia   . Hypertension   . Lung cancer [facility])    Status post surgery resection  . UTI (urinary tract infection)     Patient Active Problem List   Diagnosis Date Noted  . History of recurrent UTIs 12/29/2015  . History of bladder cancer 12/29/2015  . Subacute confusional state 12/29/2015  .  Generalized weakness 12/29/2015  . Confusion 12/29/2015  . UTI (urinary tract infection) 08/07/2015  . Pyelonephritis 07/22/2015  . E. coli UTI 07/22/2015  . Leukocytosis 07/22/2015  . Dehydration 07/22/2015  . Rectal bleeding 07/22/2015  . Pressure ulcer 05/31/2015  . Hip pain   . Encephalopathy, metabolic   . GERD (gastroesophageal reflux disease) 08/19/2014  . Depression 08/19/2014  . UTI (lower urinary tract infection) 08/19/2014  . UTI (urinary tract infection) due to Enterococcus faecium 05/21/2014  . Bradycardia 05/03/2012  . Fall 04/30/2012  . Low back pain 04/30/2012  . Lung cancer ([place]) 10/10/2011  . Atrial fibrillation ([place]) 10/10/2011  . History of stroke 10/10/2011  . Malignant neoplasm of bladder ([place]) 03/13/2008  . HLD (hyperlipidemia) 03/13/2008  . HYPERTENSION, BENIGN 02/13/2008    Past Surgical History:  Procedure Laterality Date  . CATARACT EXTRACTION, BILATERAL  1998, 2000  . CYSTECTOMY  2002  . HERNIA REPAIR  2006  . LUNG REMOVAL, PARTIAL         Home Medications    Prior to Admission medications   Medication Sig Start Date End Date Taking? Authorizing Provider  Alpha Lipoic Acid 200 MG CAPS Take 200 mg by mouth daily.   Yes Historical Provider, MD  amLODipine (NORVASC) 5 MG tablet Take 1 tablet (5 mg total) by mouth daily. 08/10/15  Yes Maryann Mikhail, DO  ascorbic acid (VITAMIN C) 1000 MG tablet Take  1,000 mg by mouth daily.    Yes Historical Provider, MD  aspirin EC 81 MG tablet Take 81 mg by mouth daily.    Yes Historical Provider, MD  atorvastatin (LIPITOR) 10 MG tablet Take 10 mg by mouth at bedtime.    Yes Historical Provider, MD  buPROPion (WELLBUTRIN) 75 MG tablet Take 75 mg by mouth daily.    Yes Historical Provider, MD  Calcium Carbonate (CALCIUM 600 PO) Take 1,000 mg by mouth daily.   Yes Historical Provider, MD  cholecalciferol (VITAMIN D) 1000 units tablet Take 1,000 Units by mouth daily.   Yes Historical Provider, MD  esomeprazole  (NEXIUM) 40 MG capsule Take 40 mg by mouth daily as needed (for acid reflux).    Yes Historical Provider, MD  feeding supplement, ENSURE ENLIVE, (ENSURE ENLIVE) LIQD Take 237 mLs by mouth daily.   Yes Historical Provider, MD  hydrocortisone valerate cream (WESTCORT) 0.2 % Apply 1 application topically 2 (two) times daily as needed (for itching/irritation).    Yes Historical Provider, MD  Melatonin 5 MG TABS Take 5 mg by mouth at bedtime.   Yes Historical Provider, MD  metoprolol succinate (TOPROL-XL) 25 MG 24 hr tablet Take 25 mg by mouth daily.    Yes Historical Provider, MD  Probiotic Product (PROBIOTIC PO) Take 460 mg by mouth daily.   Yes Historical Provider, MD  sertraline (ZOLOFT) 100 MG tablet Take 100 mg by mouth daily.    Yes Historical Provider, MD  vitamin B-12 (CYANOCOBALAMIN) 1000 MCG tablet Take 1,000 mcg by mouth daily.   Yes Historical Provider, MD  zolpidem (AMBIEN) 5 MG tablet Take 5 mg by mouth at bedtime.    Yes Historical Provider, MD    Family History Family History  Problem Relation Age of Onset  . Cancer Maternal Aunt     unknown  . Cancer Maternal Grandmother     unknown  . Aneurysm Brother   . Stroke Father     cerebral hemorrhage    Social History Social History  Substance Use Topics  . Smoking status: Former Smoker    Types: Cigarettes    Quit date: 10/10/1991  . Smokeless tobacco: Never Used  . Alcohol use No     Allergies   Sulfa antibiotics and Sulfonamide derivatives   Review of Systems Review of Systems  Unable to perform ROS: Dementia  Constitutional: Positive for fatigue. Negative for fever.  HENT: Negative for congestion and trouble swallowing.   Eyes: Negative for visual disturbance.  Respiratory: Negative for cough and shortness of breath.   Cardiovascular: Negative for chest pain.  Gastrointestinal: Positive for vomiting. Negative for abdominal pain and nausea.  Genitourinary: Positive for hematuria (2 days). Difficulty urinating:  foley in place, foul smelling urine.  Musculoskeletal: Positive for back pain.  Neurological: Negative for syncope, facial asymmetry, speech difficulty, weakness, numbness and headaches.     Physical Exam Updated Vital Signs BP (!) 158/89 (BP Location: Right Arm)   Pulse 68   Temp 97.8 F (36.6 C) (Oral)   Resp 17   Ht '6\' 1"'$  (1.854 m)   Wt 189 lb (85.7 kg)   SpO2 96%   BMI 24.94 kg/m   Physical Exam  Constitutional: He appears well-developed and well-nourished. No distress.  HENT:  Head: Normocephalic.  Scab bridge of nose  Eyes: Conjunctivae and EOM are normal.  Neck: Normal range of motion.  Cardiovascular: Normal rate, normal heart sounds and intact distal pulses.  An irregularly irregular rhythm present. Exam  reveals no gallop and no friction rub.   No murmur heard. Pulmonary/Chest: Effort normal and breath sounds normal. No respiratory distress. He has no wheezes. He has no rales.  Abdominal: Soft. He exhibits no distension. There is no tenderness. There is no guarding.  Musculoskeletal: He exhibits no edema.       Cervical back: He exhibits no bony tenderness.       Thoracic back: He exhibits tenderness.       Lumbar back: He exhibits no tenderness.  Neurological: He is alert. He has normal strength. No cranial nerve deficit or sensory deficit. GCS eye subscore is 4. GCS verbal subscore is 5. GCS motor subscore is 6.  Oriented to self and location "[place]"  Skin: Skin is warm and dry. He is not diaphoretic.  Nursing note and vitals reviewed.    ED Treatments / Results  Labs (all labs ordered are listed, but only abnormal results are displayed) Labs Reviewed  CBC WITH DIFFERENTIAL/PLATELET - Abnormal; Notable for the following:       Result Value   WBC 12.2 (*)    Neutro Abs 9.6 (*)    Monocytes Absolute 1.1 (*)    All other components within normal limits  COMPREHENSIVE METABOLIC PANEL - Abnormal; Notable for the following:    Glucose, Bld 104 (*)     BUN 21 (*)    Calcium 8.7 (*)    Total Protein 6.3 (*)    ALT 12 (*)    GFR calc non Af Amer 57 (*)    All other components within normal limits  URINALYSIS, ROUTINE W REFLEX MICROSCOPIC - Abnormal; Notable for the following:    APPearance CLOUDY (*)    Hgb urine dipstick SMALL (*)    Protein, ur 100 (*)    Nitrite POSITIVE (*)    Leukocytes, UA MODERATE (*)    Bacteria, UA FEW (*)    Squamous Epithelial / LPF 0-5 (*)    Non Squamous Epithelial 0-5 (*)    All other components within normal limits  URINE CULTURE  CULTURE, BLOOD (ROUTINE X 2)  CULTURE, BLOOD (ROUTINE X 2)  BASIC METABOLIC PANEL  CBC  I-STAT CG4 LACTIC ACID, ED    EKG  EKG Interpretation None       Radiology Dg Chest 2 View  Result Date: 02/14/2016 CLINICAL DATA:  [place] 2 days ago.  Complains of thoracic spine pain. EXAM: CHEST  2 VIEW COMPARISON:  08/05/2015 FINDINGS: There are poorly defined densities along the lateral aspect of the left upper lung which are new. No evidence for a pneumothorax. There is concern for possible fractures involving the left fifth and sixth ribs. Heart and mediastinum are within normal limits. Atherosclerotic calcifications at the aortic arch. The right lung is clear. The trachea is midline without midline shift. Evidence for old compression fractures near the thoracolumbar junction. No large pleural effusions. IMPRESSION: Vague densities in the left upper lung. These densities could represent airspace disease. In addition, there is concern for left rib fractures and the left lung densities could represent a pulmonary contusion. Negative for pneumothorax. Aortic atherosclerosis. Electronically Signed   By: Markus Daft M.D.   On: 02/14/2016 13:27   Dg Lumbar Spine Complete  Result Date: 02/14/2016 CLINICAL DATA:  Fall 2 days ago with low back pain, initial encounter EXAM: LUMBAR SPINE - COMPLETE 4+ VIEW COMPARISON:  06/23/2015 FINDINGS: Five lumbar type vertebral bodies are well  visualized. Vertebral body height is well maintained with the  exception of compression deformities at T12 and L1. Both of these are chronic in nature as previously diagnosed in June of 2017. Osteophytic changes are noted as well as disc space narrowing stable in appearance from the prior exam. Diffuse aortic calcifications are seen. There is suggestion of bilateral renal calculi. IMPRESSION: Compression deformities of T12 and L1 of a chronic nature. No acute abnormality is noted at this time. Likely bilateral renal calculi. Electronically Signed   By: Inez Catalina M.D.   On: 02/14/2016 16:42   Ct Head Wo Contrast  Result Date: 02/14/2016 CLINICAL DATA:  Fall 2 days ago, dementia EXAM: CT HEAD WITHOUT CONTRAST CT CERVICAL SPINE WITHOUT CONTRAST TECHNIQUE: Multidetector CT imaging of the head and cervical spine was performed following the standard protocol without intravenous contrast. Multiplanar CT image reconstructions of the cervical spine were also generated. COMPARISON:  CT head dated 08/05/2015 FINDINGS: CT HEAD FINDINGS Brain: No evidence of acute infarction, hemorrhage, hydrocephalus, extra-axial collection or mass lesion/mass effect. Extensive small vessel ischemic changes. Global cortical and central atrophy. Secondary ventricular prominence. Vascular: Intracranial atherosclerosis. Skull: Normal. Negative for fracture or focal lesion. Sinuses/Orbits: Minimal partial opacification of the bilateral maxillary sinuses. Visualized paranasal sinuses and mastoid air cells are otherwise clear. Other: None. CT CERVICAL SPINE FINDINGS Alignment: Normal cervical lordosis. Skull base and vertebrae: No acute fracture. No primary bone lesion or focal pathologic process. Soft tissues and spinal canal: No prevertebral fluid or swelling. No visible canal hematoma. Disc levels:  Mild degenerative changes at L4-5. Spinal canal is patent. Upper chest: Visualized lung apices are clear. Other: Visualized thyroid is  unremarkable.  Vascular calcifications. IMPRESSION: No evidence of acute intracranial abnormality. Atrophy with extensive small vessel ischemic changes. No evidence of traumatic injury to the cervical spine. Electronically Signed   By: Julian Hy M.D.   On: 02/14/2016 12:23   Ct Chest Wo Contrast  Result Date: 02/14/2016 CLINICAL DATA:  Recent fall with chest pain, initial encounter EXAM: CT CHEST WITHOUT CONTRAST TECHNIQUE: Multidetector CT imaging of the chest was performed following the standard protocol without IV contrast. COMPARISON:  Plain film from earlier in the same day FINDINGS: Cardiovascular: Somewhat limited due the lack of IV contrast. Diffuse aortic calcifications are seen without aneurysmal dilatation. Heavy coronary calcifications are seen. Mitral annular calcifications are noted as well. Mediastinum/Nodes: No significant hilar or mediastinal adenopathy is noted. The thoracic inlet is within normal limits. Lungs/Pleura: Mild right lower lobe atelectatic changes are seen. Similar but lesser changes are noted in the left lower lobe. There are changes in left hilum consistent with prior partial lobectomy Upper Abdomen: Multiple gallstones are noted within a well distended gallbladder. A few small calcifications are noted in the region of the head of the pancreas likely related to distal common bile duct stones. No obstructive changes are seen. Stable right renal cyst is noted Musculoskeletal: Postsurgical changes are noted in the posterior left chest wall. This accounts for the changes seen on recent chest x-ray. No acute rib fracture is seen. No compression deformity is chronic appearing compression deformity of T12 is noted. Superior Schmorl's node is noted. These changes are stable from the prior exam. The previously seen acute L1 fracture is not well appreciated on this exam. IMPRESSION: Bilateral lower lobe atelectasis worse on the right than the left. Changes consistent with prior  left lobectomy are seen. Cholelithiasis with evidence of choledocholithiasis. No obstructive changes are noted. Chronic changes at T12 and postsurgical changes in the left chest wall. No acute  bony abnormality is noted. Electronically Signed   By: Inez Catalina M.D.   On: 02/14/2016 15:17   Ct Cervical Spine Wo Contrast  Result Date: 02/14/2016 CLINICAL DATA:  Fall 2 days ago, dementia EXAM: CT HEAD WITHOUT CONTRAST CT CERVICAL SPINE WITHOUT CONTRAST TECHNIQUE: Multidetector CT imaging of the head and cervical spine was performed following the standard protocol without intravenous contrast. Multiplanar CT image reconstructions of the cervical spine were also generated. COMPARISON:  CT head dated 08/05/2015 FINDINGS: CT HEAD FINDINGS Brain: No evidence of acute infarction, hemorrhage, hydrocephalus, extra-axial collection or mass lesion/mass effect. Extensive small vessel ischemic changes. Global cortical and central atrophy. Secondary ventricular prominence. Vascular: Intracranial atherosclerosis. Skull: Normal. Negative for fracture or focal lesion. Sinuses/Orbits: Minimal partial opacification of the bilateral maxillary sinuses. Visualized paranasal sinuses and mastoid air cells are otherwise clear. Other: None. CT CERVICAL SPINE FINDINGS Alignment: Normal cervical lordosis. Skull base and vertebrae: No acute fracture. No primary bone lesion or focal pathologic process. Soft tissues and spinal canal: No prevertebral fluid or swelling. No visible canal hematoma. Disc levels:  Mild degenerative changes at L4-5. Spinal canal is patent. Upper chest: Visualized lung apices are clear. Other: Visualized thyroid is unremarkable.  Vascular calcifications. IMPRESSION: No evidence of acute intracranial abnormality. Atrophy with extensive small vessel ischemic changes. No evidence of traumatic injury to the cervical spine. Electronically Signed   By: Julian Hy M.D.   On: 02/14/2016 12:23   Ct T-spine No  Charge  Result Date: 02/14/2016 CLINICAL DATA:  Evaluate for compression fracture. EXAM: CT THORACIC SPINE WITHOUT CONTRAST TECHNIQUE: Multidetector CT images of the thoracic were obtained using the standard protocol without intravenous contrast. COMPARISON:  None. FINDINGS: Alignment: Exaggerated thoracic kyphosis. Vertebrae: There is a mild compression deformity of T12 vertebral body, with associated sclerotic discogenic changes of its superior endplate. There is a 2.1 cm lytic lesion within the posterior aspect of the vertebral body which disrupts the superior endplate. Thin sclerotic margin is noted. There are mild osteoarthritic changes at T11-T12 with disc osteophyte complex formation. Paraspinal and other soft tissues: No abnormal perispinal soft tissue thickening. Disc levels: Multilevel mild osteoarthritic changes. IMPRESSION: Lytic lesion within T12 vertebral body, which extends the superior endplate of the vertebral body, with chronic appearance. Differential diagnosis includes a large Schmorl's node, chronic osteomyelitis or lytic metastatic disease. Mild compression deformity of T12 vertebral body. No perivertebral soft tissue component seen. Electronically Signed   By: Fidela Salisbury M.D.   On: 02/14/2016 18:07    Procedures Procedures (including critical care time)  Medications Ordered in ED Medications  saccharomyces boulardii (FLORASTOR) capsule 500 mg (not administered)  feeding supplement (ENSURE ENLIVE) (ENSURE ENLIVE) liquid 237 mL (not administered)  amLODipine (NORVASC) tablet 5 mg (not administered)  aspirin EC tablet 81 mg (not administered)  buPROPion (WELLBUTRIN) tablet 75 mg (not administered)  pantoprazole (PROTONIX) EC tablet 80 mg (not administered)  atorvastatin (LIPITOR) tablet 10 mg (not administered)  sertraline (ZOLOFT) tablet 100 mg (not administered)  hydrocortisone cream 1 % 1 application (not administered)  enoxaparin (LOVENOX) injection 40 mg (not  administered)  cefTRIAXone (ROCEPHIN) 1 g in dextrose 5 % 50 mL IVPB (not administered)  phenazopyridine (PYRIDIUM) tablet 100 mg (not administered)  acetaminophen (TYLENOL) tablet 650 mg (not administered)  HYDROcodone-acetaminophen (NORCO/VICODIN) 5-325 MG per tablet 1 tablet (not administered)  oxyCODONE-acetaminophen (PERCOCET/ROXICET) 5-325 MG per tablet 1 tablet (1 tablet Oral Given 02/14/16 1201)  ceFEPIme (MAXIPIME) 1 g in dextrose 5 % 50 mL IVPB (0  g Intravenous Stopped 02/14/16 1605)  vancomycin (VANCOCIN) IVPB 1000 mg/200 mL premix (0 mg Intravenous Stopped 02/14/16 1724)     Initial Impression / Assessment and Plan / ED Course  I have reviewed the triage vital signs and the nursing notes.  Pertinent labs & imaging results that were available during my care of the patient were reviewed by me and considered in my medical decision making (see chart for details).  Clinical Course    82 year old male with a history hypertension, lung cancer, dementia, CVA, atrial fibrillation, bladder cancer who underwent cystoprostatectomy with ileal neobladder with chronic indwelling Foley, admission in November for concern of altered mental status and UTI, who presents with concern for increased confusion, foul-smelling urine and fall 2 days ago with back pain.  Given concern for fall and head trauma a CT head was done which showed no acute abnormalities. CSpine no acute abnormalities. Thoracic spine XR and lumbar spine show no sign of new fracture.     Given family's concerns of some increased confusion, foul-smelling urine, episode of emesis last night, we will exchange his Foley, and check urinalysis. Urinalysis done showing concern for UTI.  Chest x-ray shows possible left fourth and fifth rib fractures with opacities near the area, possibly contusion versus pneumonia. Given patient's leukocytosis, worsening confusion and generalized weakness, will treat for pneumonia. Given vancomycin and  cefepime. CT chest was done which showed no sign of pneumonia, no sign of rib fractures.    Given patient's increased confusion, vomiting, leukocytosis, back pain, will admit for treatment of UTI, encephalopathy and PT for back pain.    Final Clinical Impressions(s) / ED Diagnoses   Final diagnoses:  Fall, initial encounter  Urinary tract infection without hematuria, site unspecified  Altered mental status, unspecified altered mental status type    New Prescriptions Current Discharge Medication List       Gareth Morgan, MD 02/14/16 1916

## 2016-02-14 NOTE — H&P (Signed)
History and Physical   [name] XBD:[id number] DOB: 05/09/1930 DOA: 02/14/2016  Referring MD/NP/PA: Dr. Billy Fischer, EDP PCP: [name], MD  Patient coming from: Home, lives with granddaughter  Chief Complaint: Fall, AMS, emesis  HPI: [name] is a 82 y.o. male with a history of lung CA, bladder CA s/p cystoprostatectomy with ileal neobladder 2002 and chronic indwelling foley, dementia, CVA, AFib and HTN brought to the ED for confusion, foul-smelling urine, and back pain following a fall.   Due to dementia and encephalopathy, patient unable to provide history, which is instead obtained from wife, daughter, and medical records. They report that he lives with his granddaughter since being diagnosed with dementia several years ago and has an aide every day until he is put to bed. 2 nights ago his bed alarm went off and his granddaughter found him on the floor. He denied head trauma, headache, or LOC. Primary complaint since that time has been low back pain that is worsening, nonradiating, severe, causing him to be unwilling to walk (previously required 1-2 assist with walker). He has also begun to see spiders on ceilings and other hallucinations. This confusion, along with foul-smelling urine which they also report, usually occurs with UTIs, to which he is prone. They deny any facial droop, slurred speech, or focal weakness. In the ED he appeared in no distress, AFib with rate in 50-60's, normotensive and afebrile. Oriented to himself and "[place]," which is near his usually baseline. WBC 12.2. Lactate normal. CT head showed no acute abnormalities. Urinalysis was grossly infected. CXR suggested rib fractures and pulmonary contusion vs. pneumonia which were not seen on subsequent CT chest. Lumbar XR showed chronic T12 and L1 compression fractures. Thoracic radiographs are pending.  Review of Systems: No fevers, chills, complaints of abdominal pain or diarrhea. Had single  episode of emesis last night, otherwise eating and drinking normally, and per HPI. All others reviewed and are negative.   Past Medical History:  Diagnosis Date  . Atrial fibrillation ([place])   . Bladder cancer ([facility])    hx of surgery and chemo  . CVA (cerebral infarction)   . Dementia   . Depression 08/19/2014  . Diverticulitis   . Hyperlipidemia   . Hypertension   . Lung cancer [facility])    Status post surgery resection  . UTI (urinary tract infection)     Past Surgical History:  Procedure Laterality Date  . CATARACT EXTRACTION, BILATERAL  1998, 2000  . CYSTECTOMY  2002  . HERNIA REPAIR  2006  . LUNG REMOVAL, PARTIAL     - Retired, former smoker who does not drink living with his granddaughter.   Allergies  Allergen Reactions  . Sulfa Antibiotics Nausea And Vomiting  . Sulfonamide Derivatives Nausea And Vomiting    Family History  Problem Relation Age of Onset  . Cancer Maternal Aunt     unknown  . Cancer Maternal Grandmother     unknown  . Aneurysm Brother   . Stroke Father     cerebral hemorrhage   - Family history otherwise reviewed and not pertinent.  Prior to Admission medications   Medication Sig Start Date End Date Taking? Authorizing Provider  Alpha Lipoic Acid 200 MG CAPS Take 200 mg by mouth daily.   Yes Historical Provider, MD  amLODipine (NORVASC) 5 MG tablet Take 1 tablet (5 mg total) by mouth daily. 08/10/15  Yes Maryann Mikhail, DO  ascorbic acid (VITAMIN C) 1000 MG tablet Take 1,000 mg by mouth  daily.    Yes Historical Provider, MD  aspirin EC 81 MG tablet Take 81 mg by mouth daily.    Yes Historical Provider, MD  atorvastatin (LIPITOR) 10 MG tablet Take 10 mg by mouth at bedtime.    Yes Historical Provider, MD  buPROPion (WELLBUTRIN) 75 MG tablet Take 75 mg by mouth daily.    Yes Historical Provider, MD  Calcium Carbonate (CALCIUM 600 PO) Take 1,000 mg by mouth daily.   Yes Historical Provider, MD  cholecalciferol (VITAMIN D) 1000 units tablet Take 1,000  Units by mouth daily.   Yes Historical Provider, MD  esomeprazole (NEXIUM) 40 MG capsule Take 40 mg by mouth daily as needed (for acid reflux).    Yes Historical Provider, MD  feeding supplement, ENSURE ENLIVE, (ENSURE ENLIVE) LIQD Take 237 mLs by mouth daily.   Yes Historical Provider, MD  hydrocortisone valerate cream (WESTCORT) 0.2 % Apply 1 application topically 2 (two) times daily as needed (for itching/irritation).    Yes Historical Provider, MD  Melatonin 5 MG TABS Take 5 mg by mouth at bedtime.   Yes Historical Provider, MD  metoprolol succinate (TOPROL-XL) 25 MG 24 hr tablet Take 25 mg by mouth daily.    Yes Historical Provider, MD  Probiotic Product (PROBIOTIC PO) Take 460 mg by mouth daily.   Yes Historical Provider, MD  sertraline (ZOLOFT) 100 MG tablet Take 100 mg by mouth daily.    Yes Historical Provider, MD  vitamin B-12 (CYANOCOBALAMIN) 1000 MCG tablet Take 1,000 mcg by mouth daily.   Yes Historical Provider, MD  zolpidem (AMBIEN) 5 MG tablet Take 5 mg by mouth at bedtime.    Yes Historical Provider, MD    Physical Exam: Vitals:   02/14/16 1141 02/14/16 1228 02/14/16 1505 02/14/16 1712  BP:  111/76 129/80 146/88  Pulse:  66 (!) 57 66  Resp:  '23 14 18  '$ Temp:    97.7 F (36.5 C)  TempSrc:    Oral  SpO2:  94% 93% 95%  Weight: 85.7 kg (189 lb)     Height: '6\' 1"'$  (1.854 m)      Constitutional: 82 y.o. male in no distress, calm demeanor Eyes: Lids and conjunctivae normal, PERRL ENMT: Mucous membranes are moist. Posterior pharynx clear of any exudate or lesions. Fair dentition.  Neck: normal, supple, no masses, no thyromegaly Respiratory: Non-labored breathing room air without accessory muscle use. Clear breath sounds to auscultation bilaterally Cardiovascular: Irregular rhythm, rate in 60's, no murmurs, rubs, or gallops. No carotid bruits. No JVD. No LE edema. 1+ pedal pulses. Abdomen: Normoactive bowel sounds. No tenderness, non-distended, and no masses palpated. No  hepatosplenomegaly. GU: + indwelling catheter Musculoskeletal: No clubbing / cyanosis. No joint deformity upper and lower extremities. Good ROM, no contractures. Normal muscle tone. Do not appreciate tenderness in midline spine. Skin: Warm, dry. No rashes, wounds, no ulcers on limited exam - patient refused full skin examination.  Neurologic: CN II-XII grossly intact. Gait not assessed. Speech normal. No focal deficits in motor strength or sensation in all extremities.  Psychiatric: Alert, oriented to person and place, not time or situation.   Labs on Admission: I have personally reviewed following labs and imaging studies  CBC:  Recent Labs Lab 02/14/16 1248  WBC 12.2*  NEUTROABS 9.6*  HGB 14.1  HCT 43.5  MCV 91.0  PLT 245   Basic Metabolic Panel:  Recent Labs Lab 02/14/16 1248  NA 140  K 3.8  CL 107  CO2 26  GLUCOSE  104*  BUN 21*  CREATININE 1.15  CALCIUM 8.7*   GFR: Estimated Creatinine Clearance: 55 mL/min (by C-G formula based on SCr of 1.15 mg/dL). Liver Function Tests:  Recent Labs Lab 02/14/16 1248  AST 17  ALT 12*  ALKPHOS 47  BILITOT 1.0  PROT 6.3*  ALBUMIN 3.6   No results for input(s): LIPASE, AMYLASE in the last 168 hours. No results for input(s): AMMONIA in the last 168 hours. Coagulation Profile: No results for input(s): INR, PROTIME in the last 168 hours. Cardiac Enzymes: No results for input(s): CKTOTAL, CKMB, CKMBINDEX, TROPONINI in the last 168 hours. BNP (last 3 results) No results for input(s): PROBNP in the last 8760 hours. HbA1C: No results for input(s): HGBA1C in the last 72 hours. CBG: No results for input(s): GLUCAP in the last 168 hours. Lipid Profile: No results for input(s): CHOL, HDL, LDLCALC, TRIG, CHOLHDL, LDLDIRECT in the last 72 hours. Thyroid Function Tests: No results for input(s): TSH, T4TOTAL, FREET4, T3FREE, THYROIDAB in the last 72 hours. Anemia Panel: No results for input(s): VITAMINB12, FOLATE, FERRITIN, TIBC,  IRON, RETICCTPCT in the last 72 hours. Urine analysis:    Component Value Date/Time   COLORURINE YELLOW 02/14/2016 1254   APPEARANCEUR CLOUDY (A) 02/14/2016 1254   LABSPEC 1.011 02/14/2016 1254   PHURINE 7.0 02/14/2016 1254   GLUCOSEU NEGATIVE 02/14/2016 1254   HGBUR SMALL (A) 02/14/2016 1254   BILIRUBINUR NEGATIVE 02/14/2016 1254   KETONESUR NEGATIVE 02/14/2016 1254   PROTEINUR 100 (A) 02/14/2016 1254   UROBILINOGEN 0.2 11/07/2014 1502   NITRITE POSITIVE (A) 02/14/2016 1254   LEUKOCYTESUR MODERATE (A) 02/14/2016 1254   Sepsis Labs: '@LABRCNTIP'$ (procalcitonin:4,lacticidven:4) )No results found for this or any previous visit (from the past 240 hour(s)).   Radiological Exams on Admission: Dg Chest 2 View  Result Date: 02/14/2016 CLINICAL DATA:  [place] 2 days ago.  Complains of thoracic spine pain. EXAM: CHEST  2 VIEW COMPARISON:  08/05/2015 FINDINGS: There are poorly defined densities along the lateral aspect of the left upper lung which are new. No evidence for a pneumothorax. There is concern for possible fractures involving the left fifth and sixth ribs. Heart and mediastinum are within normal limits. Atherosclerotic calcifications at the aortic arch. The right lung is clear. The trachea is midline without midline shift. Evidence for old compression fractures near the thoracolumbar junction. No large pleural effusions. IMPRESSION: Vague densities in the left upper lung. These densities could represent airspace disease. In addition, there is concern for left rib fractures and the left lung densities could represent a pulmonary contusion. Negative for pneumothorax. Aortic atherosclerosis. Electronically Signed   By: Markus Daft M.D.   On: 02/14/2016 13:27   Dg Lumbar Spine Complete  Result Date: 02/14/2016 CLINICAL DATA:  Fall 2 days ago with low back pain, initial encounter EXAM: LUMBAR SPINE - COMPLETE 4+ VIEW COMPARISON:  06/23/2015 FINDINGS: Five lumbar type vertebral bodies are well  visualized. Vertebral body height is well maintained with the exception of compression deformities at T12 and L1. Both of these are chronic in nature as previously diagnosed in June of 2017. Osteophytic changes are noted as well as disc space narrowing stable in appearance from the prior exam. Diffuse aortic calcifications are seen. There is suggestion of bilateral renal calculi. IMPRESSION: Compression deformities of T12 and L1 of a chronic nature. No acute abnormality is noted at this time. Likely bilateral renal calculi. Electronically Signed   By: Inez Catalina M.D.   On: 02/14/2016 16:42   Ct  Head Wo Contrast  Result Date: 02/14/2016 CLINICAL DATA:  Fall 2 days ago, dementia EXAM: CT HEAD WITHOUT CONTRAST CT CERVICAL SPINE WITHOUT CONTRAST TECHNIQUE: Multidetector CT imaging of the head and cervical spine was performed following the standard protocol without intravenous contrast. Multiplanar CT image reconstructions of the cervical spine were also generated. COMPARISON:  CT head dated 08/05/2015 FINDINGS: CT HEAD FINDINGS Brain: No evidence of acute infarction, hemorrhage, hydrocephalus, extra-axial collection or mass lesion/mass effect. Extensive small vessel ischemic changes. Global cortical and central atrophy. Secondary ventricular prominence. Vascular: Intracranial atherosclerosis. Skull: Normal. Negative for fracture or focal lesion. Sinuses/Orbits: Minimal partial opacification of the bilateral maxillary sinuses. Visualized paranasal sinuses and mastoid air cells are otherwise clear. Other: None. CT CERVICAL SPINE FINDINGS Alignment: Normal cervical lordosis. Skull base and vertebrae: No acute fracture. No primary bone lesion or focal pathologic process. Soft tissues and spinal canal: No prevertebral fluid or swelling. No visible canal hematoma. Disc levels:  Mild degenerative changes at L4-5. Spinal canal is patent. Upper chest: Visualized lung apices are clear. Other: Visualized thyroid is  unremarkable.  Vascular calcifications. IMPRESSION: No evidence of acute intracranial abnormality. Atrophy with extensive small vessel ischemic changes. No evidence of traumatic injury to the cervical spine. Electronically Signed   By: Julian Hy M.D.   On: 02/14/2016 12:23   Ct Chest Wo Contrast  Result Date: 02/14/2016 CLINICAL DATA:  Recent fall with chest pain, initial encounter EXAM: CT CHEST WITHOUT CONTRAST TECHNIQUE: Multidetector CT imaging of the chest was performed following the standard protocol without IV contrast. COMPARISON:  Plain film from earlier in the same day FINDINGS: Cardiovascular: Somewhat limited due the lack of IV contrast. Diffuse aortic calcifications are seen without aneurysmal dilatation. Heavy coronary calcifications are seen. Mitral annular calcifications are noted as well. Mediastinum/Nodes: No significant hilar or mediastinal adenopathy is noted. The thoracic inlet is within normal limits. Lungs/Pleura: Mild right lower lobe atelectatic changes are seen. Similar but lesser changes are noted in the left lower lobe. There are changes in left hilum consistent with prior partial lobectomy Upper Abdomen: Multiple gallstones are noted within a well distended gallbladder. A few small calcifications are noted in the region of the head of the pancreas likely related to distal common bile duct stones. No obstructive changes are seen. Stable right renal cyst is noted Musculoskeletal: Postsurgical changes are noted in the posterior left chest wall. This accounts for the changes seen on recent chest x-ray. No acute rib fracture is seen. No compression deformity is chronic appearing compression deformity of T12 is noted. Superior Schmorl's node is noted. These changes are stable from the prior exam. The previously seen acute L1 fracture is not well appreciated on this exam. IMPRESSION: Bilateral lower lobe atelectasis worse on the right than the left. Changes consistent with prior  left lobectomy are seen. Cholelithiasis with evidence of choledocholithiasis. No obstructive changes are noted. Chronic changes at T12 and postsurgical changes in the left chest wall. No acute bony abnormality is noted. Electronically Signed   By: Inez Catalina M.D.   On: 02/14/2016 15:17   Ct Cervical Spine Wo Contrast  Result Date: 02/14/2016 CLINICAL DATA:  Fall 2 days ago, dementia EXAM: CT HEAD WITHOUT CONTRAST CT CERVICAL SPINE WITHOUT CONTRAST TECHNIQUE: Multidetector CT imaging of the head and cervical spine was performed following the standard protocol without intravenous contrast. Multiplanar CT image reconstructions of the cervical spine were also generated. COMPARISON:  CT head dated 08/05/2015 FINDINGS: CT HEAD FINDINGS Brain: No evidence of  acute infarction, hemorrhage, hydrocephalus, extra-axial collection or mass lesion/mass effect. Extensive small vessel ischemic changes. Global cortical and central atrophy. Secondary ventricular prominence. Vascular: Intracranial atherosclerosis. Skull: Normal. Negative for fracture or focal lesion. Sinuses/Orbits: Minimal partial opacification of the bilateral maxillary sinuses. Visualized paranasal sinuses and mastoid air cells are otherwise clear. Other: None. CT CERVICAL SPINE FINDINGS Alignment: Normal cervical lordosis. Skull base and vertebrae: No acute fracture. No primary bone lesion or focal pathologic process. Soft tissues and spinal canal: No prevertebral fluid or swelling. No visible canal hematoma. Disc levels:  Mild degenerative changes at L4-5. Spinal canal is patent. Upper chest: Visualized lung apices are clear. Other: Visualized thyroid is unremarkable.  Vascular calcifications. IMPRESSION: No evidence of acute intracranial abnormality. Atrophy with extensive small vessel ischemic changes. No evidence of traumatic injury to the cervical spine. Electronically Signed   By: Julian Hy M.D.   On: 02/14/2016 12:23     Assessment/Plan Principal Problem:   UTI (urinary tract infection) Active Problems:   HLD (hyperlipidemia)   HYPERTENSION, BENIGN   Atrial fibrillation ([facility])   History of stroke   Low back pain   Depression   Encephalopathy, metabolic   Urinary tract infection: UA from exchanged foley consistent with nitrite+ UTI in the setting of foul odor, leukocytosis without other obvious source, and N/V/AMS, will treat empirically and await culture data. Has h/o UTI's due to E. coli, Pseudomonas, E. faecalis, acinetobacter, and most recently proteus, which was resistant to cipro and macrobid.  - Vancomycin and cefepime given in ED: Will start CTX tomorrow AM and transition to po abx as he clinically progresses.  - Monitor urine culture - IVF's not necessary at this time - Recheck leukocytosis in AM - This will be his 6th admission this year for similar presentation, would consider suppressive antibiotics as outpatient.   Acute encephalopathy and chronic dementia: Likely metabolic encephalopathy due to UTI vs. progression of dementia.  - Observe mental status following treatment of infection - High risk for delirium while in the hospital: family to remain at bedside, minimize sedating medications - will hold ambien.   Fall at home: Initial concern for rib fractures on CXR, though CT chest negative. Has LBP and evidence of chronic T12 and L1 compression fractures on radiographs without acute fracture. Was discharged 11/8 from UTI admission with home health PT and 24-hr supervision by his granddaughter and 12hr/day aide.  - PT and OT evaluations  Atrial fibrillation: Currently rate controlled. Not on anticoagulation due to fall risk.  - Hold metoprolol this evening due to borderline bradycardia.  History of bladder CA: Cancer-free per urology s/p cystoprostatectomy and potsop chemotherapy, sees Dr. Jeffie Pollock. - In remission   HTN:  - Continue norvasc, holding metoprolol   Depression: Chronic,  stable - Continue SSRI and wellbutrin  DVT prophylaxis: Lovenox  Code Status: DNR confirmed at admission  Family Communication: Wife and daughter at bedside Disposition Plan: Observe on telemetry, Pt evaluation. Discharge to home if clinically improved 12/25 Consults called: None  Admission status: Observation    Vance Gather, MD [facility] Pager [phone]  If 7PM-7AM, please contact night-coverage [email or web address] Password [email or web address] 02/14/2016, 6:05 PM

## 2016-02-14 NOTE — Progress Notes (Addendum)
Pharmacy Antibiotic Note  [name] is a 82 y.o. male with hx of HTN, Lung Ca, dementia, CVA, A-fib and bladder CA s/p cystoprostatectomy with chronic indwelling foley admitted on 02/14/2016 with UTI.  Pharmacy has been consulted for rocephin dosing.    Plan: Rocephin 1 Gm IV q24h  Height: '6\' 1"'$  (185.4 cm) Weight: 189 lb (85.7 kg) IBW/kg (Calculated) : 79.9  Temp (24hrs), Avg:97.5 F (36.4 C), Min:97.5 F (36.4 C), Max:97.5 F (36.4 C)   Recent Labs Lab 02/14/16 1248  WBC 12.2*  CREATININE 1.15    Estimated Creatinine Clearance: 55 mL/min (by C-G formula based on SCr of 1.15 mg/dL).    Allergies  Allergen Reactions  . Sulfa Antibiotics Nausea And Vomiting  . Sulfonamide Derivatives Nausea And Vomiting    Antimicrobials this admission: 12/24 cefepime >> 12/24 12/24 vancomycin >> 12/24 12/24 rocephin >>  Dose adjustments this admission:   Microbiology results:  BCx:   UCx:   Sputum:    MRSA PCR:   Thank you for allowing pharmacy to be a part of this patient's care. Rx will sign off, since no further dose adjustments are required.  Dorrene German 02/14/2016 2:47 PM

## 2016-02-15 DIAGNOSIS — Z66 Do not resuscitate: Secondary | ICD-10-CM | POA: Diagnosis not present

## 2016-02-15 DIAGNOSIS — I482 Chronic atrial fibrillation: Secondary | ICD-10-CM | POA: Diagnosis not present

## 2016-02-15 DIAGNOSIS — F0150 Vascular dementia without behavioral disturbance: Secondary | ICD-10-CM | POA: Diagnosis not present

## 2016-02-15 DIAGNOSIS — N390 Urinary tract infection, site not specified: Secondary | ICD-10-CM | POA: Diagnosis present

## 2016-02-15 DIAGNOSIS — T83518A Infection and inflammatory reaction due to other urinary catheter, initial encounter: Secondary | ICD-10-CM | POA: Diagnosis present

## 2016-02-15 DIAGNOSIS — Y846 Urinary catheterization as the cause of abnormal reaction of the patient, or of later complication, without mention of misadventure at the time of the procedure: Secondary | ICD-10-CM | POA: Diagnosis present

## 2016-02-15 DIAGNOSIS — Z9842 Cataract extraction status, left eye: Secondary | ICD-10-CM | POA: Diagnosis not present

## 2016-02-15 DIAGNOSIS — W19XXXA Unspecified fall, initial encounter: Secondary | ICD-10-CM | POA: Diagnosis not present

## 2016-02-15 DIAGNOSIS — R451 Restlessness and agitation: Secondary | ICD-10-CM | POA: Diagnosis not present

## 2016-02-15 DIAGNOSIS — Z7189 Other specified counseling: Secondary | ICD-10-CM | POA: Diagnosis not present

## 2016-02-15 DIAGNOSIS — Z85118 Personal history of other malignant neoplasm of bronchus and lung: Secondary | ICD-10-CM | POA: Diagnosis not present

## 2016-02-15 DIAGNOSIS — Z7401 Bed confinement status: Secondary | ICD-10-CM | POA: Diagnosis not present

## 2016-02-15 DIAGNOSIS — R41841 Cognitive communication deficit: Secondary | ICD-10-CM | POA: Diagnosis not present

## 2016-02-15 DIAGNOSIS — Z1624 Resistance to multiple antibiotics: Secondary | ICD-10-CM | POA: Diagnosis present

## 2016-02-15 DIAGNOSIS — Z515 Encounter for palliative care: Secondary | ICD-10-CM | POA: Diagnosis not present

## 2016-02-15 DIAGNOSIS — E785 Hyperlipidemia, unspecified: Secondary | ICD-10-CM | POA: Diagnosis present

## 2016-02-15 DIAGNOSIS — L8990 Pressure ulcer of unspecified site, unspecified stage: Secondary | ICD-10-CM | POA: Insufficient documentation

## 2016-02-15 DIAGNOSIS — I4891 Unspecified atrial fibrillation: Secondary | ICD-10-CM | POA: Diagnosis present

## 2016-02-15 DIAGNOSIS — R296 Repeated falls: Secondary | ICD-10-CM | POA: Diagnosis present

## 2016-02-15 DIAGNOSIS — R4182 Altered mental status, unspecified: Secondary | ICD-10-CM | POA: Diagnosis not present

## 2016-02-15 DIAGNOSIS — Z9181 History of falling: Secondary | ICD-10-CM | POA: Diagnosis not present

## 2016-02-15 DIAGNOSIS — Z8673 Personal history of transient ischemic attack (TIA), and cerebral infarction without residual deficits: Secondary | ICD-10-CM | POA: Diagnosis not present

## 2016-02-15 DIAGNOSIS — Z9841 Cataract extraction status, right eye: Secondary | ICD-10-CM | POA: Diagnosis not present

## 2016-02-15 DIAGNOSIS — Z906 Acquired absence of other parts of urinary tract: Secondary | ICD-10-CM | POA: Diagnosis not present

## 2016-02-15 DIAGNOSIS — M8448XA Pathological fracture, other site, initial encounter for fracture: Secondary | ICD-10-CM | POA: Diagnosis present

## 2016-02-15 DIAGNOSIS — Z87891 Personal history of nicotine dependence: Secondary | ICD-10-CM | POA: Diagnosis not present

## 2016-02-15 DIAGNOSIS — I10 Essential (primary) hypertension: Secondary | ICD-10-CM | POA: Diagnosis not present

## 2016-02-15 DIAGNOSIS — G9341 Metabolic encephalopathy: Secondary | ICD-10-CM | POA: Diagnosis not present

## 2016-02-15 DIAGNOSIS — R279 Unspecified lack of coordination: Secondary | ICD-10-CM | POA: Diagnosis not present

## 2016-02-15 DIAGNOSIS — Z8551 Personal history of malignant neoplasm of bladder: Secondary | ICD-10-CM | POA: Diagnosis not present

## 2016-02-15 DIAGNOSIS — R531 Weakness: Secondary | ICD-10-CM | POA: Diagnosis not present

## 2016-02-15 DIAGNOSIS — Y92009 Unspecified place in unspecified non-institutional (private) residence as the place of occurrence of the external cause: Secondary | ICD-10-CM | POA: Diagnosis not present

## 2016-02-15 DIAGNOSIS — M4854XA Collapsed vertebra, not elsewhere classified, thoracic region, initial encounter for fracture: Secondary | ICD-10-CM | POA: Diagnosis not present

## 2016-02-15 DIAGNOSIS — M6281 Muscle weakness (generalized): Secondary | ICD-10-CM | POA: Diagnosis not present

## 2016-02-15 DIAGNOSIS — R441 Visual hallucinations: Secondary | ICD-10-CM | POA: Diagnosis present

## 2016-02-15 DIAGNOSIS — R2681 Unsteadiness on feet: Secondary | ICD-10-CM | POA: Diagnosis not present

## 2016-02-15 DIAGNOSIS — F329 Major depressive disorder, single episode, unspecified: Secondary | ICD-10-CM | POA: Diagnosis present

## 2016-02-15 DIAGNOSIS — Z809 Family history of malignant neoplasm, unspecified: Secondary | ICD-10-CM | POA: Diagnosis not present

## 2016-02-15 DIAGNOSIS — Z87440 Personal history of urinary (tract) infections: Secondary | ICD-10-CM | POA: Diagnosis not present

## 2016-02-15 DIAGNOSIS — R410 Disorientation, unspecified: Secondary | ICD-10-CM | POA: Diagnosis not present

## 2016-02-15 DIAGNOSIS — G9340 Encephalopathy, unspecified: Secondary | ICD-10-CM | POA: Diagnosis not present

## 2016-02-15 DIAGNOSIS — W1839XA Other fall on same level, initial encounter: Secondary | ICD-10-CM | POA: Diagnosis present

## 2016-02-15 DIAGNOSIS — Z9079 Acquired absence of other genital organ(s): Secondary | ICD-10-CM | POA: Diagnosis not present

## 2016-02-15 LAB — BASIC METABOLIC PANEL
Anion gap: 9 (ref 5–15)
BUN: 23 mg/dL — AB (ref 6–20)
CALCIUM: 9.5 mg/dL (ref 8.9–10.3)
CO2: 27 mmol/L (ref 22–32)
Chloride: 104 mmol/L (ref 101–111)
Creatinine, Ser: 1.22 mg/dL (ref 0.61–1.24)
GFR calc Af Amer: >60 mL/min (ref >60)
GFR calc non Af Amer: 53 mL/min — ABNORMAL LOW (ref >60)
GLUCOSE: 115 mg/dL — AB (ref 65–99)
Potassium: 3.8 mmol/L (ref 3.5–5.1)
Sodium: 140 mmol/L (ref 135–145)

## 2016-02-15 LAB — MAGNESIUM: MAGNESIUM: 1.9 mg/dL (ref 1.7–2.4)

## 2016-02-15 LAB — CBC
HEMATOCRIT: 45.6 % (ref 39.0–52.0)
Hemoglobin: 14.7 g/dL (ref 13.0–17.0)
MCH: 29.2 pg (ref 26.0–34.0)
MCHC: 32.2 g/dL (ref 30.0–36.0)
MCV: 90.5 fL (ref 78.0–100.0)
Platelets: 179 10*3/uL (ref 150–400)
RBC: 5.04 MIL/uL (ref 4.22–5.81)
RDW: 14.8 % (ref 11.5–15.5)
WBC: 13.4 10*3/uL — ABNORMAL HIGH (ref 4.0–10.5)

## 2016-02-15 MED ORDER — OXYCODONE HCL 5 MG PO TABS
5.0000 mg | ORAL_TABLET | ORAL | Status: DC | PRN
  Administered 2016-02-15 – 2016-02-18 (×7): 5 mg via ORAL
  Filled 2016-02-15 (×7): qty 1

## 2016-02-15 MED ORDER — HYDROCODONE-ACETAMINOPHEN 5-325 MG PO TABS
1.0000 | ORAL_TABLET | Freq: Three times a day (TID) | ORAL | Status: DC
  Administered 2016-02-15 – 2016-02-20 (×14): 1 via ORAL
  Filled 2016-02-15 (×14): qty 1

## 2016-02-15 MED ORDER — DEXTROSE 5 % IV SOLN
1.0000 g | Freq: Three times a day (TID) | INTRAVENOUS | Status: DC
  Administered 2016-02-15 – 2016-02-17 (×6): 1 g via INTRAVENOUS
  Filled 2016-02-15 (×8): qty 1

## 2016-02-15 MED ORDER — METOPROLOL SUCCINATE ER 25 MG PO TB24
25.0000 mg | ORAL_TABLET | Freq: Every day | ORAL | Status: DC
  Administered 2016-02-15 – 2016-02-20 (×6): 25 mg via ORAL
  Filled 2016-02-15 (×6): qty 1

## 2016-02-15 MED ORDER — POTASSIUM CHLORIDE CRYS ER 20 MEQ PO TBCR
30.0000 meq | EXTENDED_RELEASE_TABLET | Freq: Once | ORAL | Status: AC
  Administered 2016-02-15: 08:00:00 30 meq via ORAL
  Filled 2016-02-15: qty 1

## 2016-02-15 MED ORDER — FUROSEMIDE 40 MG PO TABS: 40.0000 mg | ORAL_TABLET | Freq: Once | ORAL | Status: DC

## 2016-02-15 NOTE — Progress Notes (Signed)
PT Cancellation Note  Patient Details Name: [name] MRN: [id number] DOB: [birth year]   Cancelled Treatment:     PT order received but eval deferred 2* pt refusal.  "my back hurts too much to move right now"  Will follow.   [name] 02/15/2016, 10:11 AM

## 2016-02-15 NOTE — Progress Notes (Signed)
PROGRESS NOTE  [name]  ZOX:[id number] DOB: 02/11/1931 DOA: 02/14/2016 PCP: [name], MD   Brief Narrative: [name] is a 82 y.o. male with a history of lung CA, bladder CA s/p cystoprostatectomy with ileal neobladder 2002 and chronic indwelling foley, dementia, CVA, AFib and HTN brought to the ED 12/24 for confusion, foul-smelling urine, and back pain following a fall. He had an unwitnessed fall 2 days PTA with worsening lower back pain causing him to be unable/unwilling to walk (previously required 1-2 assist with walker). In the ED he appeared in no distress, AFib with rate in 50-60's, normotensive and afebrile. Oriented to himself and "[place]," which is near his usually baseline, but reported seeing spiders on the ceiling intermittently. WBC 12.2. Lactate normal. CT head showed no acute abnormalities. Urinalysis was grossly infected. CXR suggested rib fractures and pulmonary contusion vs. pneumonia which were not seen on subsequent CT chest. Lumbar and thoracic XR showed chronic T12 and L1 compression fractures. He was admitted for recurrent UTI, continued on ceftriaxone. He will need physical therapy evaluation prior to discharge, and leukocytosis is stable/worsening despite abx without other signs of sepsis.   Assessment & Plan: Principal Problem:   UTI (urinary tract infection) Active Problems:   HLD (hyperlipidemia)   HYPERTENSION, BENIGN   Atrial fibrillation (HCC)   History of stroke   Low back pain   Depression   Encephalopathy, metabolic   Pressure injury of skin  Urinary tract infection: UA from exchanged foley consistent with nitrite+ UTI in the setting of foul odor, leukocytosis without other obvious source, and N/V/AMS, will treat empirically and await culture data. Has h/o UTI's due to E. coli, Pseudomonas, E. faecalis, acinetobacter, and most recently proteus, which was resistant to cipro and macrobid.  - Vancomycin and cefepime 12/24 >> CTX  12/25 >>  expand to include pseudomonas pending urine culture/sensitivities >> ceftazidime 12/25 >>  - Monitor urine culture - Leukocytosis stable/worse: recheck leukocytosis in AM - This will be his 6th admission this year for similar presentation, would consider suppressive antibiotics as outpatient.   Acute encephalopathy and chronic dementia: Likely metabolic encephalopathy due to UTI vs. progression of dementia.  - Observe mental status following treatment of infection - High risk for delirium while in the hospital: family to remain at bedside, minimize sedating medications - will hold ambien.   Fall at home: Initial concern for rib fractures on CXR, though CT chest negative. Has LBP and evidence of chronic T12 and L1 compression fractures on radiographs without acute fracture. Was discharged 11/8 from UTI admission with home health PT and 24-hr supervision by his granddaughter and 12hr/day aide.  - PT and OT evaluations  Atrial fibrillation: Currently rate controlled. Not on anticoagulation due to fall risk.  - Reorder metoprolol. - Had ~2 sec pause captured on telemetry, not significant unless > 5 sec with AFib. Will continue telemetry.  Vertebral compression fractures: Chronic in T12 and L1 per CT scans.  - Will continue treatment with vitamin D and calcium as outpatient.  History of lung CA, bladder CA: Lung CA treated surgically in 2011. Cancer-free per urology s/p cystoprostatectomy and potsop chemotherapy, sees Dr. Jeffie Pollock. Has not seen Dr. Alvy Bimler since 2015.  - T-spine CT showed lytic lesion in T12 DDx schmorl's node, chronic osteomyelitis, or lytic metastasis. Discussed with oncology, Dr. Julien Nordmann who recommends follow up as outpatient, not a candidate for aggressive therapy even if this is malignant in etiology.   HTN:  - Continue norvasc,  metoprolol   Depression: Chronic, stable - Continue SSRI and wellbutrin  DVT prophylaxis: Lovenox Code Status: DNR Family  Communication: Called grand-daughter/HCPOA, LVM Disposition Plan: Expand IV antibiotics, anticipate discharge to home vs. SNF.  Consultants:   Oncology, Dr. Julien Nordmann by phone only  Procedures:   Foley exchanged 12/24  Antimicrobials:  Cefepime and vancomycin 12/24  Ceftriaxone 12/25  Ceftazidime 12/25  Subjective: Pt remains confused, still oriented to person and hospital. Endorses low back pain stable from admission. No significant events reported overnight and no family at the bedside this morning.   Objective: Vitals:   02/14/16 1837 02/14/16 1842 02/14/16 2045 02/15/16 0559  BP: (!) 158/89  (!) 148/73 115/78  Pulse: 68  67 69  Resp: '17  20 18  '$ Temp: 97.8 F (36.6 C)  97.6 F (36.4 C) 98.1 F (36.7 C)  TempSrc: Oral  Oral Oral  SpO2: 96%  95% 93%  Weight:  87.3 kg (192 lb 7.4 oz)    Height:  '6\' 1"'$  (1.854 m)      Intake/Output Summary (Last 24 hours) at 02/15/16 1149 Last data filed at 02/15/16 1111  Gross per 24 hour  Intake              660 ml  Output             1550 ml  Net             -890 ml   Filed Weights   02/14/16 1141 02/14/16 1842  Weight: 85.7 kg (189 lb) 87.3 kg (192 lb 7.4 oz)    Examination: General exam: Calm, elderly male in no distress  Respiratory system: Non-labored breathing room air. Clear to auscultation bilaterally.  Cardiovascular system: Irreg irreg, rate ~70-80. No murmur, rub, or gallop. No JVD, and no pedal edema. Gastrointestinal system: Abdomen soft, non-tender, non-distended, with normoactive bowel sounds. No organomegaly or masses felt. Central nervous system: Alert and oriented. No focal neurological deficits. Extremities: Warm, no deformities Skin: No rashes, lesions no ulcers on limited exam - declined full skin exam again this morning. Psychiatry: UTD due to confusion/dementia.    Data Reviewed: I have personally reviewed following labs and imaging studies  CBC:  Recent Labs Lab 02/14/16 1248 02/15/16 0452  WBC  12.2* 13.4*  NEUTROABS 9.6*  --   HGB 14.1 14.7  HCT 43.5 45.6  MCV 91.0 90.5  PLT 167 809   Basic Metabolic Panel:  Recent Labs Lab 02/14/16 1248 02/15/16 0452  NA 140 140  K 3.8 3.8  CL 107 104  CO2 26 27  GLUCOSE 104* 115*  BUN 21* 23*  CREATININE 1.15 1.22  CALCIUM 8.7* 9.5  MG  --  1.9   GFR: Estimated Creatinine Clearance: 51.8 mL/min (by C-G formula based on SCr of 1.22 mg/dL). Liver Function Tests:  Recent Labs Lab 02/14/16 1248  AST 17  ALT 12*  ALKPHOS 47  BILITOT 1.0  PROT 6.3*  ALBUMIN 3.6   No results for input(s): LIPASE, AMYLASE in the last 168 hours. No results for input(s): AMMONIA in the last 168 hours. Coagulation Profile: No results for input(s): INR, PROTIME in the last 168 hours. Cardiac Enzymes: No results for input(s): CKTOTAL, CKMB, CKMBINDEX, TROPONINI in the last 168 hours. BNP (last 3 results) No results for input(s): PROBNP in the last 8760 hours. HbA1C: No results for input(s): HGBA1C in the last 72 hours. CBG: No results for input(s): GLUCAP in the last 168 hours. Lipid Profile: No results for  input(s): CHOL, HDL, LDLCALC, TRIG, CHOLHDL, LDLDIRECT in the last 72 hours. Thyroid Function Tests: No results for input(s): TSH, T4TOTAL, FREET4, T3FREE, THYROIDAB in the last 72 hours. Anemia Panel: No results for input(s): VITAMINB12, FOLATE, FERRITIN, TIBC, IRON, RETICCTPCT in the last 72 hours. Urine analysis:    Component Value Date/Time   COLORURINE YELLOW 02/14/2016 1254   APPEARANCEUR CLOUDY (A) 02/14/2016 1254   LABSPEC 1.011 02/14/2016 1254   PHURINE 7.0 02/14/2016 1254   GLUCOSEU NEGATIVE 02/14/2016 1254   HGBUR SMALL (A) 02/14/2016 1254   BILIRUBINUR NEGATIVE 02/14/2016 1254   KETONESUR NEGATIVE 02/14/2016 1254   PROTEINUR 100 (A) 02/14/2016 1254   UROBILINOGEN 0.2 11/07/2014 1502   NITRITE POSITIVE (A) 02/14/2016 1254   LEUKOCYTESUR MODERATE (A) 02/14/2016 1254   Sepsis  Labs: '@LABRCNTIP'$ (procalcitonin:4,lacticidven:4)  ) Recent Results (from the past 240 hour(s))  Urine culture     Status: None (Preliminary result)   Collection Time: 02/14/16 12:53 PM  Result Value Ref Range Status   Specimen Description URINE, RANDOM  Final   Special Requests NONE  Final   Culture   Final    CULTURE REINCUBATED FOR BETTER GROWTH Performed at [facility]    Report Status PENDING  Incomplete     Radiology Studies: Dg Chest 2 View  Result Date: 02/14/2016 CLINICAL DATA:  [place] 2 days ago.  Complains of thoracic spine pain. EXAM: CHEST  2 VIEW COMPARISON:  08/05/2015 FINDINGS: There are poorly defined densities along the lateral aspect of the left upper lung which are new. No evidence for a pneumothorax. There is concern for possible fractures involving the left fifth and sixth ribs. Heart and mediastinum are within normal limits. Atherosclerotic calcifications at the aortic arch. The right lung is clear. The trachea is midline without midline shift. Evidence for old compression fractures near the thoracolumbar junction. No large pleural effusions. IMPRESSION: Vague densities in the left upper lung. These densities could represent airspace disease. In addition, there is concern for left rib fractures and the left lung densities could represent a pulmonary contusion. Negative for pneumothorax. Aortic atherosclerosis. Electronically Signed   By: Markus Daft M.D.   On: 02/14/2016 13:27   Dg Thoracic Spine 2 View  Result Date: 02/14/2016 CLINICAL DATA:  Back pain after fall 2 days ago EXAM: THORACIC SPINE 2 VIEWS COMPARISON:  Lateral CXR 08/05/2015, lumbar spine radiographs from 06/23/2015 FINDINGS: Chronic moderate T12 and L1 compression fractures appear stable relative to 08/05/2015. Multilevel degenerative disc disease of the thoracic spine. Thoracic aortic atherosclerosis with mild uncoiling of the aorta is noted but without aneurysm. IMPRESSION: Chronic moderate T12 and  L1 compression fractures. No acute osseous abnormality. Thoracic spondylosis. Electronically Signed   By: Ashley Royalty M.D.   On: 02/14/2016 22:21   Dg Lumbar Spine Complete  Result Date: 02/14/2016 CLINICAL DATA:  Fall 2 days ago with low back pain, initial encounter EXAM: LUMBAR SPINE - COMPLETE 4+ VIEW COMPARISON:  06/23/2015 FINDINGS: Five lumbar type vertebral bodies are well visualized. Vertebral body height is well maintained with the exception of compression deformities at T12 and L1. Both of these are chronic in nature as previously diagnosed in June of 2017. Osteophytic changes are noted as well as disc space narrowing stable in appearance from the prior exam. Diffuse aortic calcifications are seen. There is suggestion of bilateral renal calculi. IMPRESSION: Compression deformities of T12 and L1 of a chronic nature. No acute abnormality is noted at this time. Likely bilateral renal calculi. Electronically Signed  By: Inez Catalina M.D.   On: 02/14/2016 16:42   Ct Head Wo Contrast  Result Date: 02/14/2016 CLINICAL DATA:  Fall 2 days ago, dementia EXAM: CT HEAD WITHOUT CONTRAST CT CERVICAL SPINE WITHOUT CONTRAST TECHNIQUE: Multidetector CT imaging of the head and cervical spine was performed following the standard protocol without intravenous contrast. Multiplanar CT image reconstructions of the cervical spine were also generated. COMPARISON:  CT head dated 08/05/2015 FINDINGS: CT HEAD FINDINGS Brain: No evidence of acute infarction, hemorrhage, hydrocephalus, extra-axial collection or mass lesion/mass effect. Extensive small vessel ischemic changes. Global cortical and central atrophy. Secondary ventricular prominence. Vascular: Intracranial atherosclerosis. Skull: Normal. Negative for fracture or focal lesion. Sinuses/Orbits: Minimal partial opacification of the bilateral maxillary sinuses. Visualized paranasal sinuses and mastoid air cells are otherwise clear. Other: None. CT CERVICAL SPINE  FINDINGS Alignment: Normal cervical lordosis. Skull base and vertebrae: No acute fracture. No primary bone lesion or focal pathologic process. Soft tissues and spinal canal: No prevertebral fluid or swelling. No visible canal hematoma. Disc levels:  Mild degenerative changes at L4-5. Spinal canal is patent. Upper chest: Visualized lung apices are clear. Other: Visualized thyroid is unremarkable.  Vascular calcifications. IMPRESSION: No evidence of acute intracranial abnormality. Atrophy with extensive small vessel ischemic changes. No evidence of traumatic injury to the cervical spine. Electronically Signed   By: Julian Hy M.D.   On: 02/14/2016 12:23   Ct Chest Wo Contrast  Result Date: 02/14/2016 CLINICAL DATA:  Recent fall with chest pain, initial encounter EXAM: CT CHEST WITHOUT CONTRAST TECHNIQUE: Multidetector CT imaging of the chest was performed following the standard protocol without IV contrast. COMPARISON:  Plain film from earlier in the same day FINDINGS: Cardiovascular: Somewhat limited due the lack of IV contrast. Diffuse aortic calcifications are seen without aneurysmal dilatation. Heavy coronary calcifications are seen. Mitral annular calcifications are noted as well. Mediastinum/Nodes: No significant hilar or mediastinal adenopathy is noted. The thoracic inlet is within normal limits. Lungs/Pleura: Mild right lower lobe atelectatic changes are seen. Similar but lesser changes are noted in the left lower lobe. There are changes in left hilum consistent with prior partial lobectomy Upper Abdomen: Multiple gallstones are noted within a well distended gallbladder. A few small calcifications are noted in the region of the head of the pancreas likely related to distal common bile duct stones. No obstructive changes are seen. Stable right renal cyst is noted Musculoskeletal: Postsurgical changes are noted in the posterior left chest wall. This accounts for the changes seen on recent chest  x-ray. No acute rib fracture is seen. No compression deformity is chronic appearing compression deformity of T12 is noted. Superior Schmorl's node is noted. These changes are stable from the prior exam. The previously seen acute L1 fracture is not well appreciated on this exam. IMPRESSION: Bilateral lower lobe atelectasis worse on the right than the left. Changes consistent with prior left lobectomy are seen. Cholelithiasis with evidence of choledocholithiasis. No obstructive changes are noted. Chronic changes at T12 and postsurgical changes in the left chest wall. No acute bony abnormality is noted. Electronically Signed   By: Inez Catalina M.D.   On: 02/14/2016 15:17   Ct Cervical Spine Wo Contrast  Result Date: 02/14/2016 CLINICAL DATA:  Fall 2 days ago, dementia EXAM: CT HEAD WITHOUT CONTRAST CT CERVICAL SPINE WITHOUT CONTRAST TECHNIQUE: Multidetector CT imaging of the head and cervical spine was performed following the standard protocol without intravenous contrast. Multiplanar CT image reconstructions of the cervical spine were also generated. COMPARISON:  CT head dated 08/05/2015 FINDINGS: CT HEAD FINDINGS Brain: No evidence of acute infarction, hemorrhage, hydrocephalus, extra-axial collection or mass lesion/mass effect. Extensive small vessel ischemic changes. Global cortical and central atrophy. Secondary ventricular prominence. Vascular: Intracranial atherosclerosis. Skull: Normal. Negative for fracture or focal lesion. Sinuses/Orbits: Minimal partial opacification of the bilateral maxillary sinuses. Visualized paranasal sinuses and mastoid air cells are otherwise clear. Other: None. CT CERVICAL SPINE FINDINGS Alignment: Normal cervical lordosis. Skull base and vertebrae: No acute fracture. No primary bone lesion or focal pathologic process. Soft tissues and spinal canal: No prevertebral fluid or swelling. No visible canal hematoma. Disc levels:  Mild degenerative changes at L4-5. Spinal canal is  patent. Upper chest: Visualized lung apices are clear. Other: Visualized thyroid is unremarkable.  Vascular calcifications. IMPRESSION: No evidence of acute intracranial abnormality. Atrophy with extensive small vessel ischemic changes. No evidence of traumatic injury to the cervical spine. Electronically Signed   By: Julian Hy M.D.   On: 02/14/2016 12:23   Ct T-spine No Charge  Result Date: 02/14/2016 CLINICAL DATA:  Evaluate for compression fracture. EXAM: CT THORACIC SPINE WITHOUT CONTRAST TECHNIQUE: Multidetector CT images of the thoracic were obtained using the standard protocol without intravenous contrast. COMPARISON:  None. FINDINGS: Alignment: Exaggerated thoracic kyphosis. Vertebrae: There is a mild compression deformity of T12 vertebral body, with associated sclerotic discogenic changes of its superior endplate. There is a 2.1 cm lytic lesion within the posterior aspect of the vertebral body which disrupts the superior endplate. Thin sclerotic margin is noted. There are mild osteoarthritic changes at T11-T12 with disc osteophyte complex formation. Paraspinal and other soft tissues: No abnormal perispinal soft tissue thickening. Disc levels: Multilevel mild osteoarthritic changes. IMPRESSION: Lytic lesion within T12 vertebral body, which extends the superior endplate of the vertebral body, with chronic appearance. Differential diagnosis includes a large Schmorl's node, chronic osteomyelitis or lytic metastatic disease. Mild compression deformity of T12 vertebral body. No perivertebral soft tissue component seen. Electronically Signed   By: Fidela Salisbury M.D.   On: 02/14/2016 18:07    Scheduled Meds: . amLODipine  5 mg Oral Daily  . aspirin EC  81 mg Oral Daily  . atorvastatin  10 mg Oral QHS  . buPROPion  75 mg Oral Daily  . cefTRIAXone (ROCEPHIN)  IV  1 g Intravenous Q24H  . enoxaparin (LOVENOX) injection  40 mg Subcutaneous QHS  . feeding supplement (ENSURE ENLIVE)  237 mL  Oral Daily  . phenazopyridine  100 mg Oral TID WC  . saccharomyces boulardii  500 mg Oral Daily  . sertraline  100 mg Oral Daily   Continuous Infusions:   LOS: 0 days   Time spent: 25 minutes.  Vance Gather, MD [facility] Pager [phone]  If 7PM-7AM, please contact night-coverage [email or web address] Password [facility] 02/15/2016, 11:49 AM

## 2016-02-15 NOTE — Progress Notes (Signed)
Pharmacy Antibiotic Follow-up Note  [name] is a 81 y.o. year-old male admitted on 02/14/2016.  The patient is currently on day 1 of Ceftazidime for UTI. 60 yoM with hx of HTN, Lung Ca, dementia, CVA, A-fib and bladder Ca s/p fall 2 days ago and now with fever.  Rocephin for UTI. Chronic indwelling foley with neo-bladder  Assessment/Plan:  Ceftazidime 1gm q8hr  Temp (24hrs), Avg:97.8 F (36.6 C), Min:97.6 F (36.4 C), Max:98.1 F (36.7 C)   Recent Labs Lab 02/14/16 1248 02/15/16 0452  WBC 12.2* 13.4*    Recent Labs Lab 02/14/16 1248 02/15/16 0452  CREATININE 1.15 1.22   Estimated Creatinine Clearance: 51.8 mL/min (by C-G formula based on SCr of 1.22 mg/dL).    Allergies  Allergen Reactions  . Sulfa Antibiotics Nausea And Vomiting  . Sulfonamide Derivatives Nausea And Vomiting   Antimicrobials this admission:  12/ 24  cefepime >> 12/24 12/24  vancomycin  >> 12/24 12/25 rocephin  >> 12/25 12/25 Ceftaz >>  Dose adjustments this admission:   Microbiology results:  12/24 BCx: sent 12/24 UCx: ngtd, reincubated  Thank you for allowing pharmacy to be a part of this patient's care.  Minda Ditto PharmD 02/15/2016 12:40 PM

## 2016-02-15 NOTE — Progress Notes (Signed)
Pt had 2.19 second pause, pt asymptomatic and VSS. On-call notified, will continue to monitor and carry out any new orders given.

## 2016-02-16 LAB — CBC
HEMATOCRIT: 45.7 % (ref 39.0–52.0)
HEMOGLOBIN: 14.7 g/dL (ref 13.0–17.0)
MCH: 29.3 pg (ref 26.0–34.0)
MCHC: 32.2 g/dL (ref 30.0–36.0)
MCV: 91 fL (ref 78.0–100.0)
Platelets: 162 10*3/uL (ref 150–400)
RBC: 5.02 MIL/uL (ref 4.22–5.81)
RDW: 15 % (ref 11.5–15.5)
WBC: 13.7 10*3/uL — ABNORMAL HIGH (ref 4.0–10.5)

## 2016-02-16 LAB — BASIC METABOLIC PANEL
Anion gap: 9 (ref 5–15)
BUN: 21 mg/dL — AB (ref 6–20)
CHLORIDE: 105 mmol/L (ref 101–111)
CO2: 25 mmol/L (ref 22–32)
CREATININE: 1.16 mg/dL (ref 0.61–1.24)
Calcium: 9.3 mg/dL (ref 8.9–10.3)
GFR calc Af Amer: >60 mL/min (ref >60)
GFR calc non Af Amer: 56 mL/min — ABNORMAL LOW (ref >60)
GLUCOSE: 115 mg/dL — AB (ref 65–99)
Potassium: 3.9 mmol/L (ref 3.5–5.1)
Sodium: 139 mmol/L (ref 135–145)

## 2016-02-16 LAB — URINE CULTURE

## 2016-02-16 LAB — GLUCOSE, CAPILLARY: Glucose-Capillary: 141 mg/dL — ABNORMAL HIGH (ref 65–99)

## 2016-02-16 NOTE — Evaluation (Addendum)
Physical Therapy Evaluation Patient Details Name: [name] MRN: [id number] DOB: 1930-02-23 Today's Date: 02/16/2016   History of Present Illness   82 y.o. male with a history of lung CA, bladder CA s/p cystoprostatectomy with ileal neobladder 2002 and chronic indwelling foley, dementia, CVA, AFib and HTN brought to the ED for confusion, foul-smelling urine, and back pain following a fall. Dx of UTI, Compression deformities of T12 and L1 of a chronic nature  Clinical Impression  Pt admitted with above diagnosis. Pt currently with functional limitations due to the deficits listed below (see PT Problem List). +2 assist for supine to sit and sit to stand, pt only able to stand ~20 sec with +2 assist for posterior lean. SNF recommended.  Pt will benefit from skilled PT to increase their independence and safety with mobility to allow discharge to the venue listed below.       Follow Up Recommendations SNF;Supervision/Assistance - 24 hour    Equipment Recommendations  None recommended by PT    Recommendations for Other Services OT consult     Precautions / Restrictions Precautions Precautions: Fall Precaution Comments: pt reports multiple falls at home, pt unable to give details of how many or when falls occurred Restrictions Weight Bearing Restrictions: No      Mobility  Bed Mobility Overal bed mobility: Needs Assistance Bed Mobility: Supine to Sit;Sit to Supine     Supine to sit: +2 for physical assistance;Max assist Sit to supine: +2 for physical assistance;Max assist   General bed mobility comments: pt 30%, assist to raise trunk and advance BLEs  Transfers Overall transfer level: Needs assistance Equipment used: Rolling walker (2 wheeled) Transfers: Sit to/from Stand Sit to Stand: +2 physical assistance;Total assist         General transfer comment: pt 20%, assist to rise, and to steady 2* posterior lean (unable to come to neutral in standing with RW), VCs  hand placement. Pt stood ~20 sec with RW with heavy posterior lean.   Ambulation/Gait                Stairs            Wheelchair Mobility    Modified Rankin (Stroke Patients Only)       Balance Overall balance assessment: Needs assistance   Sitting balance-Leahy Scale: Fair     Standing balance support: Bilateral upper extremity supported Standing balance-Leahy Scale: Zero Standing balance comment: +2 mod A for posterior lean, unable to come to neutral with verbal/manual cues with RW                             Pertinent Vitals/Pain Pain Assessment: Faces Faces Pain Scale: Hurts even more Pain Location: back pain with movement Pain Descriptors / Indicators: Grimacing Pain Intervention(s): Limited activity within patient's tolerance;Monitored during session;Premedicated before session;Repositioned    Home Living Family/patient expects to be discharged to:: Private residence Living Arrangements: Other (Comment) Advertising account executive) Available Help at Discharge: Personal care attendant (9 hrs/day) Type of Home: House Home Access: Stairs to enter   [employer] of Steps: uncertain Home Layout: Two level Home Equipment: Walker - 4 wheels;Wheelchair - manual Additional Comments: spouse and pt live in downstairs apt at granddaughter's home    Prior Function Level of Independence: Needs assistance   Gait / Transfers Assistance Needed: spouse reports he has been able to ambulate short distance with RW however typically will not, mostly transfers, uses WC  ADL's /  Homemaking Assistance Needed: granddaughter assists pt with showers, pt independent with getting dressed, granddaughter does cooking/cleaning (info from previous admission on 12/30/15)  Comments: info obtained from previous encounter 12/30/15 (pt has dementia, unable to provide PLOF)     Hand Dominance        Extremity/Trunk Assessment   Upper Extremity Assessment Upper Extremity  Assessment: Defer to OT evaluation    Lower Extremity Assessment Lower Extremity Assessment: Overall WFL for tasks assessed (knee ext +4/5 B)    Cervical / Trunk Assessment Cervical / Trunk Assessment: Kyphotic  Communication   Communication: HOH  Cognition Arousal/Alertness: Awake/alert Behavior During Therapy: WFL for tasks assessed/performed Overall Cognitive Status: No family/caregiver present to determine baseline cognitive functioning (h/o dementia, pt able to follow simple commands with increased time, decr short term memory)                      General Comments      Exercises  Heel slides and hip ABDuction x 10 B in supine AAROM   Assessment/Plan    PT Assessment Patient needs continued PT services  PT Problem List Decreased strength;Decreased activity tolerance;Decreased balance;Decreased mobility;Decreased cognition;Pain          PT Treatment Interventions DME instruction;Functional mobility training;Gait training;Therapeutic exercise;Therapeutic activities;Balance training;Patient/family education    PT Goals (Current goals can be found in the Care Plan section)  Acute Rehab PT Goals Patient Stated Goal: to get stronger PT Goal Formulation: With patient Time For Goal Achievement: 03/01/16 Potential to Achieve Goals: Fair    Frequency Min 3X/week   Barriers to discharge        Co-evaluation               End of Session Equipment Utilized During Treatment: Gait belt Activity Tolerance: Patient limited by pain Patient left: in bed;with call bell/phone within reach;with bed alarm set Nurse Communication: Mobility status         Time: 1009-1040 PT Time Calculation (min) (ACUTE ONLY): 31 min   Charges:   PT Evaluation $PT Eval Moderate Complexity: 1 Procedure PT Treatments $Therapeutic Activity: 8-22 mins   PT G Codes:        Philomena Doheny 02/16/2016, 10:55 AM

## 2016-02-16 NOTE — Evaluation (Signed)
Clinical/Bedside Swallow Evaluation Patient Details  Name: [name] MRN: [id number] Date of Birth: [birth year]  Today's Date: 02/16/2016 Time: SLP Start Time (ACUTE ONLY): 20 SLP Stop Time (ACUTE ONLY): 0940 SLP Time Calculation (min) (ACUTE ONLY): 20 min  Past Medical History:  Past Medical History:  Diagnosis Date  . Atrial fibrillation ([place])   . Bladder cancer (HCC)    hx of surgery and chemo  . CVA (cerebral infarction)   . Dementia   . Depression 08/19/2014  . Diverticulitis   . Hyperlipidemia   . Hypertension   . Lung cancer [facility])    Status post surgery resection  . UTI (urinary tract infection)    Past Surgical History:  Past Surgical History:  Procedure Laterality Date  . CATARACT EXTRACTION, BILATERAL  1998, 2000  . CYSTECTOMY  2002  . HERNIA REPAIR  2006  . LUNG REMOVAL, PARTIAL     HPI:  82 year old male admitted 02/14/16 after a fall, AMS and emesis. PMH significant for lung and bladder CA, dementia, CVA, AFib, HTN. Chest CT reveals BLL atelectasis R>L. BSE ordered to evaluate swallow function and safety.   Assessment / Plan / Recommendation Clinical Impression  Pt presents with adequate oral motor strength and function. He requires cues to swallow, and is unable to demonstrate the ability to self-feed. Oral pocketing noted, with benefit of alternating solids and liquids. Pt seen for BSE in June 2017, with recommendation for Dys 2 diet and nectar thick liquids. Will recommend changing solids to dys 2 for energy conservation, but will continue thin liquids at this time. ST will follow for assessment of diet tolerance.     Aspiration Risk  Moderate aspiration risk    Diet Recommendation Dysphagia 2 (Fine chop);Thin liquid   Liquid Administration via: Straw;Cup Medication Administration: Crushed with puree Supervision: Staff to assist with self feeding;Full supervision/cueing for compensatory strategies Compensations: Minimize environmental  distractions;Slow rate;Small sips/bites;Follow solids with liquid Postural Changes: Seated upright at 90 degrees;Remain upright for at least 30 minutes after po intake    Other  Recommendations Oral Care Recommendations: Oral care QID   Follow up Recommendations   will follow acutely for diet tolerance assessment and education     Frequency and Duration min 1 x/week  2 weeks       Prognosis Prognosis for Safe Diet Advancement: Fair Barriers to Reach Goals: Cognitive deficits      Swallow Study   General Date of Onset: 02/14/16 HPI: 82 year old male admitted 02/14/16 after a fall, AMS and emesis. PMH significant for lung and bladder CA, dementia, CVA, AFib, HTN. Chest CT reveals BLL atelectasis R>L. BSE ordered to evaluate swallow function and safety. Type of Study: Bedside Swallow Evaluation Previous Swallow Assessment: BSE 07/2015 with recommendation for Dys 2/nectar thick liquid Diet Prior to this Study: Regular;Thin liquids Temperature Spikes Noted: No Respiratory Status: Room air History of Recent Intubation: No Behavior/Cognition: Alert;Cooperative;Requires cueing Oral Cavity Assessment: Within Functional Limits Oral Care Completed by SLP: No Oral Cavity - Dentition: Adequate natural dentition Vision: Functional for self-feeding Self-Feeding Abilities: Needs assist Patient Positioning: Upright in bed Baseline Vocal Quality: Normal Volitional Cough: Cognitively unable to elicit Volitional Swallow: Unable to elicit    Oral/Motor/Sensory Function Overall Oral Motor/Sensory Function: Within functional limits   Ice Chips Ice chips: Not tested   Thin Liquid Thin Liquid: Within functional limits Presentation: Straw    Nectar Thick Nectar Thick Liquid: Not tested   Honey Thick Honey Thick Liquid: Not tested  Puree Puree: Within functional limits Presentation: Spoon   Solid   GO   Solid: Impaired Oral Phase Impairments: Poor awareness of bolus Oral Phase Functional  Implications: Prolonged oral transit;Oral holding (pocketing, requires cues to swallow)       Celia B. Quentin Ore [facility], [place]  Shonna Chock 02/16/2016,9:52 AM

## 2016-02-16 NOTE — Progress Notes (Signed)
PROGRESS NOTE  [name]  RAX:[id number] DOB: 14-Mar-1930 DOA: 02/14/2016 PCP: [name], MD   Brief Narrative: [name] is a 82 y.o. male with a history of lung CA, bladder CA s/p cystoprostatectomy with ileal neobladder 2002 and chronic indwelling foley, dementia, CVA, AFib and HTN brought to the ED 12/24 for confusion, foul-smelling urine, and back pain following a fall. He had an unwitnessed fall 2 days PTA with worsening lower back pain causing him to be unable/unwilling to walk (previously required 1-2 assist with walker). In the ED he appeared in no distress, AFib with rate in 50-60's, normotensive and afebrile. Oriented to himself and "[place]," which is near his usually baseline, but reported seeing spiders on the ceiling intermittently. WBC 12.2. Lactate normal. CT head showed no acute abnormalities. Urinalysis was grossly infected. CXR suggested rib fractures and pulmonary contusion vs. pneumonia which were not seen on subsequent CT chest. Lumbar and thoracic XR showed chronic T12 and L1 compression fractures. He was admitted for recurrent UTI, continued on ceftriaxone. Leukocytosis has failed to improve, so antipseudomonal cephalosporin was started. Physical therapy evaluation recommends SNF.  Assessment & Plan: Principal Problem:   Urinary tract infection without hematuria Active Problems:   HLD (hyperlipidemia)   HYPERTENSION, BENIGN   Atrial fibrillation (HCC)   History of stroke   Low back pain   Depression   Encephalopathy, metabolic   Pressure injury of skin   UTI (urinary tract infection)  Urinary tract infection: UA from exchanged foley consistent with nitrite+ UTI in the setting of foul odor, leukocytosis without other obvious source, and N/V/AMS, will treat empirically and await culture data. Has h/o UTI's due to E. coli, Pseudomonas, E. faecalis, acinetobacter, and most recently proteus, which was resistant to cipro and macrobid.  - Vancomycin  and cefepime 12/24 >> CTX 12/25 >>  expand to include pseudomonas pending urine culture/sensitivities >> ceftazidime 12/25 >>  - Urine culture non-clonal. Will repeat. - Leukocytosis stable/worse: recheck leukocytosis in AM - This will be his 6th admission this year for similar presentation, would consider suppressive antibiotics as outpatient.   Acute encephalopathy and chronic dementia: Likely metabolic encephalopathy due to UTI vs. progression of dementia.  - Observe mental status following treatment of infection - High risk for delirium while in the hospital: family to remain at bedside, minimize sedating medications - will hold ambien.   Fall at home: Initial concern for rib fractures on CXR, though CT chest negative. Has LBP and evidence of chronic T12 and L1 compression fractures on radiographs without acute fracture. Was discharged 11/8 from UTI admission with home health PT and 24-hr supervision by his granddaughter and 12hr/day aide.  - PT and OT evaluations recommend SNF. CSW consulted  Atrial fibrillation: Currently rate controlled. Not on anticoagulation due to fall risk. Went back into NSR 12/26. - Reordered metoprolol. - Had ~2 sec pause captured on telemetry, not significant unless > 5 sec with AFib. Will continue telemetry.  Vertebral compression fractures: Chronic in T12 and L1 per CT scans.  - Will continue treatment with vitamin D and calcium as outpatient.  History of lung CA, bladder CA: Lung CA treated surgically in 2011. Cancer-free per urology s/p cystoprostatectomy and potsop chemotherapy, sees Dr. Jeffie Pollock. Has not seen Dr. Alvy Bimler since 2015.  - T-spine CT showed lytic lesion in T12 DDx schmorl's node, chronic osteomyelitis, or lytic metastasis. Discussed with oncology, Dr. Julien Nordmann who recommends follow up as outpatient, not a candidate for aggressive therapy even if this  is malignant in etiology.   HTN:  - Continue norvasc, metoprolol   Depression: Chronic,  stable - Continue SSRI and wellbutrin  DVT prophylaxis: Lovenox Code Status: DNR Family Communication: Called grand-daughter/HCPOA, LVM Disposition Plan: Discharge to home vs. SNF once clinically improved.  Consultants:   Oncology, Dr. Julien Nordmann by phone only  Procedures:   Foley exchanged 12/24  Antimicrobials:  Cefepime and vancomycin 12/24  Ceftriaxone 12/25  Ceftazidime 12/25  Subjective: Pt remains confused, still oriented to person and hospital. Low back pain is improved with po pain medications. No significant events reported overnight and no family at the bedside this morning.   Objective: Vitals:   02/15/16 1300 02/15/16 2041 02/16/16 0519 02/16/16 1634  BP: 133/76 115/85 (!) 153/84 (!) 149/80  Pulse: 85 70 81 67  Resp: '16 18 18 16  '$ Temp: 97.7 F (36.5 C) 98.7 F (37.1 C) 98.7 F (37.1 C) 97.5 F (36.4 C)  TempSrc: Oral Oral  Oral  SpO2: 94% 93% 94% 92%  Weight:      Height:        Intake/Output Summary (Last 24 hours) at 02/16/16 1744 Last data filed at 02/16/16 1459  Gross per 24 hour  Intake             1450 ml  Output              750 ml  Net              700 ml   Filed Weights   02/14/16 1141 02/14/16 1842  Weight: 85.7 kg (189 lb) 87.3 kg (192 lb 7.4 oz)    Examination: General exam: Calm, elderly male in no distress  Respiratory system: Non-labored breathing room air. Clear to auscultation bilaterally.  Cardiovascular system: RRR. No murmur, rub, or gallop. No JVD, and no pedal edema. Gastrointestinal system: Abdomen soft, non-tender, non-distended, with normoactive bowel sounds. No organomegaly or masses felt. Central nervous system: Alert and oriented. No focal neurological deficits. Extremities: Warm, no deformities Skin: No rashes, lesions no ulcers on limited exam - declined full skin exam again this morning. Psychiatry: UTD due to confusion/dementia.    Data Reviewed: I have personally reviewed following labs and imaging  studies  CBC:  Recent Labs Lab 02/14/16 1248 02/15/16 0452 02/16/16 0442  WBC 12.2* 13.4* 13.7*  NEUTROABS 9.6*  --   --   HGB 14.1 14.7 14.7  HCT 43.5 45.6 45.7  MCV 91.0 90.5 91.0  PLT 167 179 409   Basic Metabolic Panel:  Recent Labs Lab 02/14/16 1248 02/15/16 0452 02/16/16 0442  NA 140 140 139  K 3.8 3.8 3.9  CL 107 104 105  CO2 '26 27 25  '$ GLUCOSE 104* 115* 115*  BUN 21* 23* 21*  CREATININE 1.15 1.22 1.16  CALCIUM 8.7* 9.5 9.3  MG  --  1.9  --    GFR: Estimated Creatinine Clearance: 54.5 mL/min (by C-G formula based on SCr of 1.16 mg/dL). Liver Function Tests:  Recent Labs Lab 02/14/16 1248  AST 17  ALT 12*  ALKPHOS 47  BILITOT 1.0  PROT 6.3*  ALBUMIN 3.6   No results for input(s): LIPASE, AMYLASE in the last 168 hours. No results for input(s): AMMONIA in the last 168 hours. Coagulation Profile: No results for input(s): INR, PROTIME in the last 168 hours. Cardiac Enzymes: No results for input(s): CKTOTAL, CKMB, CKMBINDEX, TROPONINI in the last 168 hours. BNP (last 3 results) No results for input(s): PROBNP in the last 8760  hours. HbA1C: No results for input(s): HGBA1C in the last 72 hours. CBG:  Recent Labs Lab 02/16/16 1708  GLUCAP 141*   Lipid Profile: No results for input(s): CHOL, HDL, LDLCALC, TRIG, CHOLHDL, LDLDIRECT in the last 72 hours. Thyroid Function Tests: No results for input(s): TSH, T4TOTAL, FREET4, T3FREE, THYROIDAB in the last 72 hours. Anemia Panel: No results for input(s): VITAMINB12, FOLATE, FERRITIN, TIBC, IRON, RETICCTPCT in the last 72 hours. Urine analysis:    Component Value Date/Time   COLORURINE YELLOW 02/14/2016 1254   APPEARANCEUR CLOUDY (A) 02/14/2016 1254   LABSPEC 1.011 02/14/2016 1254   PHURINE 7.0 02/14/2016 1254   GLUCOSEU NEGATIVE 02/14/2016 1254   HGBUR SMALL (A) 02/14/2016 1254   BILIRUBINUR NEGATIVE 02/14/2016 1254   KETONESUR NEGATIVE 02/14/2016 1254   PROTEINUR 100 (A) 02/14/2016 1254    UROBILINOGEN 0.2 11/07/2014 1502   NITRITE POSITIVE (A) 02/14/2016 1254   LEUKOCYTESUR MODERATE (A) 02/14/2016 1254   Sepsis Labs: '@LABRCNTIP'$ (procalcitonin:4,lacticidven:4)  ) Recent Results (from the past 240 hour(s))  Urine culture     Status: Abnormal   Collection Time: 02/14/16 12:53 PM  Result Value Ref Range Status   Specimen Description URINE, RANDOM  Final   Special Requests NONE  Final   Culture MULTIPLE SPECIES PRESENT, SUGGEST RECOLLECTION (A)  Final   Report Status 02/16/2016 FINAL  Final  Blood culture (routine x 2)     Status: None (Preliminary result)   Collection Time: 02/14/16  3:16 PM  Result Value Ref Range Status   Specimen Description BLOOD LEFT ANTECUBITAL  Final   Special Requests BOTTLES DRAWN AEROBIC AND ANAEROBIC 5 CC EACH  Final   Culture   Final    NO GROWTH 2 DAYS Performed at [facility]    Report Status PENDING  Incomplete  Blood culture (routine x 2)     Status: None (Preliminary result)   Collection Time: 02/14/16  3:35 PM  Result Value Ref Range Status   Specimen Description BLOOD LEFT HAND  Final   Special Requests BOTTLES DRAWN AEROBIC ONLY 5CC  Final   Culture   Final    NO GROWTH 2 DAYS Performed at [facility]    Report Status PENDING  Incomplete     Radiology Studies: No results found.  Scheduled Meds: . amLODipine  5 mg Oral Daily  . aspirin EC  81 mg Oral Daily  . atorvastatin  10 mg Oral QHS  . buPROPion  75 mg Oral Daily  . cefTAZidime (FORTAZ)  IV  1 g Intravenous Q8H  . enoxaparin (LOVENOX) injection  40 mg Subcutaneous QHS  . feeding supplement (ENSURE ENLIVE)  237 mL Oral Daily  . HYDROcodone-acetaminophen  1 tablet Oral Q8H  . metoprolol succinate  25 mg Oral Daily  . phenazopyridine  100 mg Oral TID WC  . saccharomyces boulardii  500 mg Oral Daily  . sertraline  100 mg Oral Daily   Continuous Infusions:   LOS: 1 day   Time spent: 25 minutes.  Vance Gather, MD [facility] Pager  [phone]  If 7PM-7AM, please contact night-coverage [email or web address] Password [facility] 02/16/2016, 5:44 PM

## 2016-02-16 NOTE — Progress Notes (Signed)
  OT Cancellation Note  Patient Details Name: [name] MRN: [id number] DOB: [birth year]   Cancelled Treatment:     Noted plan for SNF per  PT note.  Will defer OT eval to SNF at this time.   Kari Baars, [place]  [name] 02/16/2016, 11:33 AM

## 2016-02-16 NOTE — Progress Notes (Signed)
Pt leaking around foley cath heavily. MD paged, orders received to irrigate foley cath due to neobladder which can cause increase sediment.  Foley cath irrigated with large amount of sediment returned.  Foley cath flowing well at this time. Will continue to monitor. [name] A

## 2016-02-17 DIAGNOSIS — Z8673 Personal history of transient ischemic attack (TIA), and cerebral infarction without residual deficits: Secondary | ICD-10-CM

## 2016-02-17 LAB — CBC
HCT: 44.9 % (ref 39.0–52.0)
Hemoglobin: 14.7 g/dL (ref 13.0–17.0)
MCH: 29.6 pg (ref 26.0–34.0)
MCHC: 32.7 g/dL (ref 30.0–36.0)
MCV: 90.5 fL (ref 78.0–100.0)
PLATELETS: 188 10*3/uL (ref 150–400)
RBC: 4.96 MIL/uL (ref 4.22–5.81)
RDW: 14.9 % (ref 11.5–15.5)
WBC: 18.3 10*3/uL — ABNORMAL HIGH (ref 4.0–10.5)

## 2016-02-17 MED ORDER — PIPERACILLIN-TAZOBACTAM 3.375 G IVPB
3.3750 g | Freq: Three times a day (TID) | INTRAVENOUS | Status: DC
  Administered 2016-02-17 – 2016-02-20 (×9): 3.375 g via INTRAVENOUS
  Filled 2016-02-17 (×8): qty 50

## 2016-02-17 NOTE — Consult Note (Signed)
   [facility] Inpatient Consult   02/17/2016  BAER HINTON 05/24/1930 [id number]     Patient screened for potential [facility] Care Management services. Chart reviewed. Noted current discharge plan is likely for SNF.  There are no identifiable [facility] Care Management needs at this time. If patient's post hospital needs change, please place a [facility] Care Management consult. For questions please contact:  Marthenia Rolling, [place], RN,BSN [facility] Liaison [phone]

## 2016-02-17 NOTE — Clinical Social Work Note (Signed)
Clinical Social Work Assessment  Patient Details  Name: [name] MRN: [id number] Date of Birth: [birth year]  Date of referral:  02/17/16               Reason for consult:  Facility Placement                Permission sought to share information with:  Chartered certified accountant granted to share information::  Yes, Verbal Permission Granted  Name::        Agency::     Relationship::     Contact Information:     Housing/Transportation Living arrangements for the past 2 months:  Single Family Home Source of Information:  Adult Children (daughter-lindsay) Patient Interpreter Needed:  None Criminal Activity/Legal Involvement Pertinent to Current Situation/Hospitalization:    Significant Relationships:  Adult Children Lives with:  Adult Children [name]) Do you feel safe going back to the place where you live?  No Need for family participation in patient care:  Yes (Comment)  Care giving concerns:  CSW spoke with patients daughter, [name], who has concerns regarding patients ability to return home. [name] states " he seems weaker this time and I believe he would benefit from a nursing home".    Social Worker assessment / plan:  Patient currently lives with his daughter, [name], where he receives care 10 hours each day from different services. Patients daughter stated "I have always tried to keep him out of a nursing home". Patients daughter was uneasy regarding SNF placement but was agreeable to faxing out patient and would prefer [facility] due to location. Patients daughter would like to be updated. CSW will complete FL2 and fax patient out.   Employment status:  Retired Forensic scientist:  Medicare PT Recommendations:  [place] / Referral to community resources:  [place]  Patient/Family's Response to care:  Patients daughter, [name], appreciated [place].   Patient/Family's Understanding of and  Emotional Response to Diagnosis, Current Treatment, and Prognosis: Patients daughter understood current treatment and prognosis.  Emotional Assessment Appearance:    Attitude/Demeanor/Rapport:  Unable to Assess Affect (typically observed):  Unable to Assess Orientation:    Alcohol / Substance use:    Psych involvement (Current and /or in the community):  No (Comment)  Discharge Needs  Concerns to be addressed:  Discharge Planning Concerns Readmission within the last 30 days:  No Current discharge risk:  None Barriers to Discharge:  No Barriers Identified   Weston Anna, LCSW 02/17/2016, 9:19 PM

## 2016-02-17 NOTE — [place] FL2 (Signed)
[place] MEDICAID FL2 LEVEL OF CARE SCREENING TOOL     IDENTIFICATION  Patient Name: [name] Birthdate: [birth year] Sex: male Admission Date (Current Location): 02/14/2016  [facility] and [place] Number:  Herbalist and Address:  [facility],  [address] [place], [place]      Provider Number: [id number]  Attending Physician Name and Address:  Donne Hazel, MD  Relative Name and Phone Number:       Current Level of Care: Hospital Recommended Level of Care: [place] Prior Approval Number:    Date Approved/Denied:   PASRR Number: [id number]  Discharge Plan: SNF    Current Diagnoses: Patient Active Problem List   Diagnosis Date Noted  . Pressure injury of skin 02/15/2016  . UTI (urinary tract infection) 02/15/2016  . History of recurrent UTIs 12/29/2015  . History of bladder cancer 12/29/2015  . Subacute confusional state 12/29/2015  . Generalized weakness 12/29/2015  . Confusion 12/29/2015  . Urinary tract infection without hematuria 08/07/2015  . Pyelonephritis 07/22/2015  . E. coli UTI 07/22/2015  . Leukocytosis 07/22/2015  . Dehydration 07/22/2015  . Rectal bleeding 07/22/2015  . Pressure ulcer 05/31/2015  . Hip pain   . Encephalopathy, metabolic   . GERD (gastroesophageal reflux disease) 08/19/2014  . Depression 08/19/2014  . UTI (lower urinary tract infection) 08/19/2014  . UTI (urinary tract infection) due to Enterococcus faecium 05/21/2014  . Bradycardia 05/03/2012  . Fall 04/30/2012  . Low back pain 04/30/2012  . Lung cancer ([place]) 10/10/2011  . Atrial fibrillation ([place]) 10/10/2011  . History of stroke 10/10/2011  . Malignant neoplasm of bladder ([place]) 03/13/2008  . HLD (hyperlipidemia) 03/13/2008  . HYPERTENSION, BENIGN 02/13/2008    Orientation RESPIRATION BLADDER Height & Weight     Self, Place  Normal Incontinent Weight: 192 lb 7.4 oz (87.3 kg) Height:  '6\' 1"'$  (185.4 cm)  BEHAVIORAL  SYMPTOMS/MOOD NEUROLOGICAL BOWEL NUTRITION STATUS        Diet (DYS2)  AMBULATORY STATUS COMMUNICATION OF NEEDS Skin   Extensive Assist Verbally                         Personal Care Assistance Level of Assistance  Bathing, Dressing, Feeding Bathing Assistance: Maximum assistance Feeding assistance: Limited assistance Dressing Assistance: Maximum assistance     Functional Limitations Info             SPECIAL CARE FACTORS FREQUENCY  PT (By licensed PT), OT (By licensed OT)     PT Frequency: 5 OT Frequency: 5            Contractures      Additional Factors Info  Code Status, Allergies Code Status Info: DNR Allergies Info: SULFA ANTIBIOTICS, SULFONAMIDE DERIVATIVES            Current Medications (02/17/2016):  This is the current hospital active medication list Current Facility-Administered Medications  Medication Dose Route Frequency Provider Last Rate Last Dose  . acetaminophen (TYLENOL) tablet 650 mg  650 mg Oral Q6H PRN Maren Reamer, MD      . amLODipine (NORVASC) tablet 5 mg  5 mg Oral Daily Patrecia Pour, MD   5 mg at 02/17/16 1036  . aspirin EC tablet 81 mg  81 mg Oral Daily Patrecia Pour, MD   81 mg at 02/17/16 1036  . atorvastatin (LIPITOR) tablet 10 mg  10 mg Oral QHS Patrecia Pour, MD   10 mg  at 02/16/16 2101  . buPROPion [facility]) tablet 75 mg  75 mg Oral Daily Patrecia Pour, MD   75 mg at 02/17/16 1035  . enoxaparin (LOVENOX) injection 40 mg  40 mg Subcutaneous QHS Patrecia Pour, MD   40 mg at 02/16/16 2100  . feeding supplement (ENSURE ENLIVE) (ENSURE ENLIVE) liquid 237 mL  237 mL Oral Daily Patrecia Pour, MD   237 mL at 02/17/16 1036  . HYDROcodone-acetaminophen (NORCO/VICODIN) 5-325 MG per tablet 1 tablet  1 tablet Oral Q8H Patrecia Pour, MD   1 tablet at 02/17/16 1440  . hydrocortisone cream 1 % 1 application  1 application Topical BID PRN Patrecia Pour, MD      . metoprolol succinate (TOPROL-XL) 24 hr tablet 25 mg  25 mg Oral Daily Patrecia Pour,  MD   25 mg at 02/17/16 1035  . oxyCODONE (Oxy IR/ROXICODONE) immediate release tablet 5 mg  5 mg Oral Q4H PRN Patrecia Pour, MD   5 mg at 02/17/16 1158  . pantoprazole (PROTONIX) EC tablet 80 mg  80 mg Oral Daily PRN Patrecia Pour, MD      . phenazopyridine (PYRIDIUM) tablet 100 mg  100 mg Oral TID WC Maren Reamer, MD   100 mg at 02/17/16 1719  . piperacillin-tazobactam (ZOSYN) IVPB 3.375 g  3.375 g Intravenous Q8H Donne Hazel, MD   3.375 g at 02/17/16 1156  . saccharomyces boulardii (FLORASTOR) capsule 500 mg  500 mg Oral Daily Patrecia Pour, MD   500 mg at 02/17/16 1036  . sertraline (ZOLOFT) tablet 100 mg  100 mg Oral Daily Patrecia Pour, MD   100 mg at 02/17/16 1036     Discharge Medications: Please see discharge summary for a list of discharge medications.  Relevant Imaging Results:  Relevant Lab Results:   Additional Information SS#: [id number]  Weston Anna, LCSW

## 2016-02-17 NOTE — Progress Notes (Signed)
PROGRESS NOTE    [name]  QVZ:[id number] DOB: 29-Feb-1930 DOA: 02/14/2016 PCP: [name], MD    Brief Narrative:  82 y.o.malewith a history of lung CA, bladder CA s/p cystoprostatectomy with ileal neobladder 2002 and chronic indwelling foley, dementia, CVA, AFib and HTN brought to the ED 12/24 for confusion, foul-smelling urine, and back pain following a fall. He had an unwitnessed fall 2 days PTA with worsening lower back pain causing him to be unable/unwilling to walk (previously required 1-2 assist with walker). In the ED he appeared in no distress, AFib with rate in 50-60's, normotensive and afebrile. Oriented to himself and "[place]," which is near his usually baseline, but reported seeing spiders on the ceiling intermittently. WBC 12.2. Lactate normal. CT head showed no acute abnormalities. Urinalysis was grossly infected. CXR suggested rib fractures and pulmonary contusion vs. pneumonia which were not seen on subsequent CT chest. Lumbar and thoracic XR showed chronic T12 and L1 compression fractures. He was admitted for recurrent UTI, continued on ceftriaxone. Leukocytosis has failed to improve, so antipseudomonal cephalosporin was started. Physical therapy evaluation recommends SNF.  Assessment & Plan:   Principal Problem:   Urinary tract infection without hematuria Active Problems:   HLD (hyperlipidemia)   HYPERTENSION, BENIGN   Atrial fibrillation (HCC)   History of stroke   Low back pain   Depression   Encephalopathy, metabolic   Pressure injury of skin   UTI (urinary tract infection)   Urinary tract infection:  -UA from exchanged foley consistent with nitrite+ UTI in the setting of foul odor, leukocytosis without other obvious source, and N/V/AMS, will treat empirically and await culture data.  -Chart reviewed. Patient has h/oUTI's due to E. coli, Pseudomonas, E. faecalis, acinetobacter, and most recently proteus, which was resistant to cipro and  macrobid but all sensitive to zosyn - Pending urine cx - Will change abx to zosyn given above UTI history - Repeat CBC in AM - Discussed with family, who is interested in discussing with Palliative Care regarding Goals of Care should patient develop resistant UTI in the future.   Acute encephalopathy and chronic dementia:  -Suspect toxic-metabolic secondary to UTI -Avoid sedating meds if possible.   Fall at home:  -Initial concern for rib fractures on CXR, though CT chest negative. Has LBP and evidence of chronic T12 and L1 compression fractureson radiographs without acute fracture. Was discharged 11/8 from UTI admission with home health PT and 24-hr supervision by his granddaughter and 12hr/day aide.  - PT and OT evaluations recommend SNF. CSW consulted -Stable at present  Atrial fibrillation:  -Remains rate controlled -Currently not on anticoagulation due to fall risk.  - Continue metoprolol as tolerated - Had ~2 sec pause captured on telemetry, not significant unless > 5 sec with AFib.  -Stable at present.  Vertebral compression fractures:  - Patient with chronic appearing fractures involving T12 and L1 per CT scans.  - continue analgesics as tolerated - Have consulted Palliative Care to assist with pain management  History of lung CA, bladder CA:  -Lung CA treated surgically in 2011. Cancer-free per urology s/p cystoprostatectomy and potsop chemotherapy, sees Dr. Jeffie Pollock. Has not seen Dr. Alvy Bimler since 2015.  - T-spine CT showed lytic lesion in T12 DDx schmorl's node, chronic osteomyelitis, or lytic metastasis. Discussed with oncology, Dr. Julien Nordmann who recommends follow up as outpatient, not a candidate for aggressive therapy even if this is malignant in etiology. -Stable at present   HTN:  - Continue norvasc, metoprolol  -  Stable currently  Depression: - Continue SSRI and wellbutrin - Appears stable currently  DVT prophylaxis: Lovenox subQ Code Status: DNR Family  Communication: Pt in room, family at bedside Disposition Plan: Uncertain at this time  Consultants:     Procedures:     Antimicrobials: Anti-infectives    Start     Dose/Rate Route Frequency Ordered Stop   02/17/16 1400  piperacillin-tazobactam (ZOSYN) IVPB 3.375 g     3.375 g 12.5 mL/hr over 240 Minutes Intravenous Every 8 hours 02/17/16 1125     02/15/16 1300  cefTAZidime (FORTAZ) 1 g in dextrose 5 % 50 mL IVPB  Status:  Discontinued     1 g 100 mL/hr over 30 Minutes Intravenous Every 8 hours 02/15/16 1234 02/17/16 1125   02/15/16 0600  vancomycin (VANCOCIN) IVPB 750 mg/150 ml premix  Status:  Discontinued     750 mg 150 mL/hr over 60 Minutes Intravenous Every 12 hours 02/14/16 1641 02/14/16 1838   02/15/16 0600  cefTRIAXone (ROCEPHIN) 1 g in dextrose 5 % 50 mL IVPB  Status:  Discontinued     1 g 100 mL/hr over 30 Minutes Intravenous Every 24 hours 02/14/16 1902 02/15/16 1200   02/14/16 1515  vancomycin (VANCOCIN) IVPB 1000 mg/200 mL premix     1,000 mg 200 mL/hr over 60 Minutes Intravenous  Once 02/14/16 1442 02/14/16 1724   02/14/16 1445  ceFEPIme (MAXIPIME) 1 g in dextrose 5 % 50 mL IVPB     1 g 100 mL/hr over 30 Minutes Intravenous  Once 02/14/16 1435 02/14/16 1605       Subjective: No complaints at this time  Objective: Vitals:   02/16/16 1634 02/16/16 2129 02/17/16 0436 02/17/16 1403  BP: (!) 149/80 121/68 126/69 134/63  Pulse: 67 76 88 65  Resp: '16 18 18 14  '$ Temp: 97.5 F (36.4 C) 98.5 F (36.9 C) 97.5 F (36.4 C) 98.4 F (36.9 C)  TempSrc: Oral Oral Oral Oral  SpO2: 92% 91% 91% 91%  Weight:      Height:        Intake/Output Summary (Last 24 hours) at 02/17/16 1934 Last data filed at 02/17/16 1500  Gross per 24 hour  Intake              770 ml  Output             1000 ml  Net             -230 ml   Filed Weights   02/14/16 1141 02/14/16 1842  Weight: 85.7 kg (189 lb) 87.3 kg (192 lb 7.4 oz)    Examination:  General exam: Appears calm and  comfortable  Respiratory system: Clear to auscultation. Respiratory effort normal. Cardiovascular system: S1 & S2 heard, RRR.Marland Kitchen Gastrointestinal system: Abdomen is nondistended, soft and nontender. No organomegaly or masses felt. Normal bowel sounds heard. Central nervous system: Alert and oriented. No focal neurological deficits. Extremities: Symmetric 5 x 5 power. Skin: No rashes, lesions  Psychiatry: Judgement and insight appear normal. Mood & affect appropriate.   Data Reviewed: I have personally reviewed following labs and imaging studies  CBC:  Recent Labs Lab 02/14/16 1248 02/15/16 0452 02/16/16 0442 02/17/16 0611  WBC 12.2* 13.4* 13.7* 18.3*  NEUTROABS 9.6*  --   --   --   HGB 14.1 14.7 14.7 14.7  HCT 43.5 45.6 45.7 44.9  MCV 91.0 90.5 91.0 90.5  PLT 167 179 162 920   Basic Metabolic Panel:  Recent Labs  Lab 02/14/16 1248 02/15/16 0452 02/16/16 0442  NA 140 140 139  K 3.8 3.8 3.9  CL 107 104 105  CO2 '26 27 25  '$ GLUCOSE 104* 115* 115*  BUN 21* 23* 21*  CREATININE 1.15 1.22 1.16  CALCIUM 8.7* 9.5 9.3  MG  --  1.9  --    GFR: Estimated Creatinine Clearance: 54.5 mL/min (by C-G formula based on SCr of 1.16 mg/dL). Liver Function Tests:  Recent Labs Lab 02/14/16 1248  AST 17  ALT 12*  ALKPHOS 47  BILITOT 1.0  PROT 6.3*  ALBUMIN 3.6   No results for input(s): LIPASE, AMYLASE in the last 168 hours. No results for input(s): AMMONIA in the last 168 hours. Coagulation Profile: No results for input(s): INR, PROTIME in the last 168 hours. Cardiac Enzymes: No results for input(s): CKTOTAL, CKMB, CKMBINDEX, TROPONINI in the last 168 hours. BNP (last 3 results) No results for input(s): PROBNP in the last 8760 hours. HbA1C: No results for input(s): HGBA1C in the last 72 hours. CBG:  Recent Labs Lab 02/16/16 1708  GLUCAP 141*   Lipid Profile: No results for input(s): CHOL, HDL, LDLCALC, TRIG, CHOLHDL, LDLDIRECT in the last 72 hours. Thyroid Function  Tests: No results for input(s): TSH, T4TOTAL, FREET4, T3FREE, THYROIDAB in the last 72 hours. Anemia Panel: No results for input(s): VITAMINB12, FOLATE, FERRITIN, TIBC, IRON, RETICCTPCT in the last 72 hours. Sepsis Labs:  Recent Labs Lab 02/14/16 1545  LATICACIDVEN 0.83    Recent Results (from the past 240 hour(s))  Urine culture     Status: Abnormal   Collection Time: 02/14/16 12:53 PM  Result Value Ref Range Status   Specimen Description URINE, RANDOM  Final   Special Requests NONE  Final   Culture MULTIPLE SPECIES PRESENT, SUGGEST RECOLLECTION (A)  Final   Report Status 02/16/2016 FINAL  Final  Blood culture (routine x 2)     Status: None (Preliminary result)   Collection Time: 02/14/16  3:16 PM  Result Value Ref Range Status   Specimen Description BLOOD LEFT ANTECUBITAL  Final   Special Requests BOTTLES DRAWN AEROBIC AND ANAEROBIC 5 CC EACH  Final   Culture   Final    NO GROWTH 3 DAYS Performed at [facility]    Report Status PENDING  Incomplete  Blood culture (routine x 2)     Status: None (Preliminary result)   Collection Time: 02/14/16  3:35 PM  Result Value Ref Range Status   Specimen Description BLOOD LEFT HAND  Final   Special Requests BOTTLES DRAWN AEROBIC ONLY 5CC  Final   Culture   Final    NO GROWTH 3 DAYS Performed at [facility]    Report Status PENDING  Incomplete     Radiology Studies: No results found.  Scheduled Meds: . amLODipine  5 mg Oral Daily  . aspirin EC  81 mg Oral Daily  . atorvastatin  10 mg Oral QHS  . buPROPion  75 mg Oral Daily  . enoxaparin (LOVENOX) injection  40 mg Subcutaneous QHS  . feeding supplement (ENSURE ENLIVE)  237 mL Oral Daily  . HYDROcodone-acetaminophen  1 tablet Oral Q8H  . metoprolol succinate  25 mg Oral Daily  . phenazopyridine  100 mg Oral TID WC  . piperacillin-tazobactam (ZOSYN)  IV  3.375 g Intravenous Q8H  . saccharomyces boulardii  500 mg Oral Daily  . sertraline  100 mg Oral Daily    Continuous Infusions:   LOS: 2 days   CHIU,  Orpah Melter, MD [facility] Pager [phone]  If 7PM-7AM, please contact night-coverage [email or web address] Password [facility] 02/17/2016, 7:34 PM

## 2016-02-17 NOTE — Progress Notes (Signed)
Speech Language Pathology Treatment: Dysphagia  Patient Details Name: [name] MRN: [id number] DOB: 1930-06-17 Today's Date: 02/17/2016 Time: 1255-1310 SLP Time Calculation (min) (ACUTE ONLY): 15 min  Assessment / Plan / Recommendation Clinical Impression  Pt consumed thin liquids after encouragement from family/SLP via straw with small and larger volumes taken with skilled observation and minimal verbal cues provided to initiate the swallow d/t consistent oral holding noted with each sip; refused any other consistency; Nursing stated pt has decreased appetite, but with swallowing strategies, he appears to be tolerating Dysphagia 2/thin diet without overt s/s of aspiration noted during meals; family stated he does tend to hold consistencies when eating/drinking at home and needs consistent cueing to swallow.  ST will con't to f/u for diet tolerance and education with family/caregivers re: safest diet/swallowing strategies to utilize to minimize/eliminate aspiration risk d/t cognitive deficits.   HPI HPI: 82 year old male admitted 02/14/16 after a fall, AMS and emesis. PMH significant for lung and bladder CA, dementia, CVA, AFib, HTN. Chest CT reveals BLL atelectasis R>L. BSE ordered to evaluate swallow function and safety.      SLP Plan  Continue with current plan of care     Recommendations  Diet recommendations: Dysphagia 3 (mechanical soft);Thin liquid Liquids provided via: Cup;Straw (small sips) Medication Administration: Crushed with puree Supervision: Patient able to self feed;Staff to assist with self feeding Compensations: Minimize environmental distractions;Slow rate;Small sips/bites;Follow solids with liquid                Oral Care Recommendations: Oral care QID Follow up Recommendations: Other (comment) (TBD) Plan: Continue with current plan of care                      ADAMS,PAT, M.S., CCC-SLP 02/17/2016, 1:19 PM

## 2016-02-18 DIAGNOSIS — M545 Low back pain, unspecified: Secondary | ICD-10-CM

## 2016-02-18 DIAGNOSIS — R4182 Altered mental status, unspecified: Secondary | ICD-10-CM

## 2016-02-18 DIAGNOSIS — Z7189 Other specified counseling: Secondary | ICD-10-CM

## 2016-02-18 DIAGNOSIS — S22000A Wedge compression fracture of unspecified thoracic vertebra, initial encounter for closed fracture: Secondary | ICD-10-CM

## 2016-02-18 DIAGNOSIS — Z515 Encounter for palliative care: Secondary | ICD-10-CM

## 2016-02-18 LAB — CBC
HEMATOCRIT: 45.1 % (ref 39.0–52.0)
HEMOGLOBIN: 14.6 g/dL (ref 13.0–17.0)
MCH: 29.4 pg (ref 26.0–34.0)
MCHC: 32.4 g/dL (ref 30.0–36.0)
MCV: 90.7 fL (ref 78.0–100.0)
Platelets: 180 10*3/uL (ref 150–400)
RBC: 4.97 MIL/uL (ref 4.22–5.81)
RDW: 15 % (ref 11.5–15.5)
WBC: 17.3 10*3/uL — ABNORMAL HIGH (ref 4.0–10.5)

## 2016-02-18 LAB — BASIC METABOLIC PANEL
Anion gap: 9 (ref 5–15)
BUN: 26 mg/dL — ABNORMAL HIGH (ref 6–20)
CALCIUM: 9.5 mg/dL (ref 8.9–10.3)
CO2: 24 mmol/L (ref 22–32)
Chloride: 108 mmol/L (ref 101–111)
Creatinine, Ser: 1.23 mg/dL (ref 0.61–1.24)
GFR calc Af Amer: >60 mL/min (ref >60)
GFR calc non Af Amer: 52 mL/min — ABNORMAL LOW (ref >60)
GLUCOSE: 123 mg/dL — AB (ref 65–99)
Potassium: 3.9 mmol/L (ref 3.5–5.1)
Sodium: 141 mmol/L (ref 135–145)

## 2016-02-18 LAB — URINE CULTURE: CULTURE: NO GROWTH

## 2016-02-18 MED ORDER — SODIUM CHLORIDE 0.9 % IV SOLN
INTRAVENOUS | Status: DC
  Administered 2016-02-18 – 2016-02-20 (×3): via INTRAVENOUS

## 2016-02-18 MED ORDER — MORPHINE SULFATE (CONCENTRATE) 10 MG/0.5ML PO SOLN: 2.5000 mg | ORAL | Status: DC | PRN

## 2016-02-18 MED ORDER — LIDOCAINE 5 % EX PTCH
1.0000 | MEDICATED_PATCH | Freq: Every day | CUTANEOUS | Status: DC
  Administered 2016-02-18 – 2016-02-20 (×3): 1 via TRANSDERMAL
  Filled 2016-02-18 (×3): qty 1

## 2016-02-18 MED ORDER — ACETAMINOPHEN 325 MG PO TABS
650.0000 mg | ORAL_TABLET | Freq: Four times a day (QID) | ORAL | Status: DC
  Administered 2016-02-18 – 2016-02-20 (×8): 650 mg via ORAL
  Filled 2016-02-18 (×8): qty 2

## 2016-02-18 NOTE — Consult Note (Signed)
Consultation Note Date: 02/18/2016   Patient Name: [name]  DOB: [birth year]  MRN: [id number]  Age / Sex: 82 y.o., male  PCP: [name], MD Referring Physician: Donne Hazel, MD  Reason for Consultation: Establishing goals of care and Pain control  HPI/Patient Profile: 82 y.o. male  with past medical history of lung cancer (remote, s/p chemotherapy and lung resection), bladder cancer (remote,s/p cystoprostatectomy w/ neobladder and chronic foleyp), dementia, CVA, aFib, HTN admitted on 02/14/2016 with confusion and fall, workup revealed compression fractures and UTI. Palliative medicine consulted for symptom management and GOC.   Clinical Assessment and Goals of Care: Patient seen in bed. Complains of severe pain in his back. States nothing has relieved his pain. Xray shows compression fracture of T12 and L1.  He is disoriented to person place and time. According to chart review, baseline is oriented.  He can currently only talk about his pain. Review of chart reveals patient was at home when he fell, current plan is for him to go to SNF at discharge. Spoke with his emergency contact, [name] (granddaughter). Offered scheduled Tylenol and lidocaine patches for now. He would likely benefit from low dose decadron, but in light of UTI will hold off for now. Family meeting scheduled for tomorrow at 11am to better determine baseline and GOC.    Primary Decision Maker NEXT OF KIN- spouse Rise Paganini    SUMMARY OF RECOMMENDATIONS -Lidocaine patch to T12-L1; leave on for 12hours, then remove for 12 hours -Tylenol '650mg'$  q6hr po- evidence shows scheduled Tylenol in conjunction with opioid increases pain relief -Continue oxycodone IR '5mg'$  q4hrs prn     Code Status/Advance Care Planning:  DNR    Symptom Management:   As above  Palliative Prophylaxis:   Delirium Protocol and Frequent Pain  Assessment  Prognosis:    Unable to determine  Discharge Planning: To Be Determined  Primary Diagnoses: Present on Admission: . Urinary tract infection without hematuria . HLD (hyperlipidemia) . HYPERTENSION, BENIGN . Atrial fibrillation ([place]) . Low back pain . Depression . Encephalopathy, metabolic . UTI (urinary tract infection)   I have reviewed the medical record, interviewed the patient and family, and examined the patient. The following aspects are pertinent.  Past Medical History:  Diagnosis Date  . Atrial fibrillation ([place])   . Bladder cancer (HCC)    hx of surgery and chemo  . CVA (cerebral infarction)   . Dementia   . Depression 08/19/2014  . Diverticulitis   . Hyperlipidemia   . Hypertension   . Lung cancer [facility])    Status post surgery resection  . UTI (urinary tract infection)    Social History   Social History  . Marital status: Married    Spouse name: N/A  . Number of children: 3  . Years of education: N/A   Occupational History  . retired    Social History Main Topics  . Smoking status: Former Smoker    Types: Cigarettes    Quit date: 10/10/1991  . Smokeless tobacco: Never  Used  . Alcohol use No  . Drug use: No  . Sexual activity: Not Currently   Other Topics Concern  . None   Social History Narrative   ** Merged History Encounter **       The patient has 3 children. He lives in [place]. 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 He is a remote smoker. He drinks very rare alcohol. He has grandchildren and great-grandchildren in the area.   Family History  Problem Relation Age of Onset  . Cancer Maternal Aunt     unknown  . Cancer Maternal Grandmother     unknown  . Aneurysm Brother   . Stroke Father     cerebral hemorrhage   Scheduled Meds: . acetaminophen  650 mg Oral Q6H  . amLODipine  5 mg Oral Daily  . aspirin EC  81 mg Oral Daily  . atorvastatin  10 mg Oral QHS  .  buPROPion  75 mg Oral Daily  . enoxaparin (LOVENOX) injection  40 mg Subcutaneous QHS  . feeding supplement (ENSURE ENLIVE)  237 mL Oral Daily  . HYDROcodone-acetaminophen  1 tablet Oral Q8H  . lidocaine  1 patch Transdermal Q24H  . metoprolol succinate  25 mg Oral Daily  . phenazopyridine  100 mg Oral TID WC  . piperacillin-tazobactam (ZOSYN)  IV  3.375 g Intravenous Q8H  . saccharomyces boulardii  500 mg Oral Daily  . sertraline  100 mg Oral Daily   Continuous Infusions: . sodium chloride 75 mL/hr at 02/18/16 0919   PRN Meds:.hydrocortisone cream, oxyCODONE, pantoprazole Medications Prior to Admission:  Prior to Admission medications   Medication Sig Start Date End Date Taking? Authorizing Provider  Alpha Lipoic Acid 200 MG CAPS Take 200 mg by mouth daily.   Yes Historical Provider, MD  amLODipine (NORVASC) 5 MG tablet Take 1 tablet (5 mg total) by mouth daily. 08/10/15  Yes Maryann Mikhail, DO  ascorbic acid (VITAMIN C) 1000 MG tablet Take 1,000 mg by mouth daily.    Yes Historical Provider, MD  aspirin EC 81 MG tablet Take 81 mg by mouth daily.    Yes Historical Provider, MD  atorvastatin (LIPITOR) 10 MG tablet Take 10 mg by mouth at bedtime.    Yes Historical Provider, MD  buPROPion (WELLBUTRIN) 75 MG tablet Take 75 mg by mouth daily.    Yes Historical Provider, MD  Calcium Carbonate (CALCIUM 600 PO) Take 1,000 mg by mouth daily.   Yes Historical Provider, MD  cholecalciferol (VITAMIN D) 1000 units tablet Take 1,000 Units by mouth daily.   Yes Historical Provider, MD  esomeprazole (NEXIUM) 40 MG capsule Take 40 mg by mouth daily as needed (for acid reflux).    Yes Historical Provider, MD  feeding supplement, ENSURE ENLIVE, (ENSURE ENLIVE) LIQD Take 237 mLs by mouth daily.   Yes Historical Provider, MD  hydrocortisone valerate cream (WESTCORT) 0.2 % Apply 1 application topically 2 (two) times daily as needed (for itching/irritation).    Yes Historical Provider, MD  Melatonin 5 MG TABS  Take 5 mg by mouth at bedtime.   Yes Historical Provider, MD  metoprolol succinate (TOPROL-XL) 25 MG 24 hr tablet Take 25 mg by mouth daily.    Yes Historical Provider, MD  Probiotic Product (PROBIOTIC PO) Take 460 mg by mouth daily.   Yes Historical Provider, MD  sertraline (ZOLOFT) 100 MG tablet Take 100 mg by mouth daily.    Yes Historical Provider, MD  vitamin B-12 (CYANOCOBALAMIN) 1000 MCG tablet Take 1,000 mcg by mouth daily.   Yes Historical Provider, MD  zolpidem (AMBIEN) 5 MG tablet Take 5 mg by mouth at bedtime.    Yes Historical Provider, MD   Allergies  Allergen Reactions  . Sulfa Antibiotics Nausea And Vomiting  . Sulfonamide Derivatives Nausea And Vomiting   Review of Systems  Physical Exam  Vital Signs: BP 127/78 (BP Location: Right Arm)   Pulse 93   Temp 97.7 F (36.5 C) (Oral)   Resp 18   Ht '6\' 1"'$  (1.854 m)   Wt 87.3 kg (192 lb 7.4 oz)   SpO2 90%   BMI 25.39 kg/m  Pain Assessment: No/denies pain   Pain Score: 0-No pain   SpO2: SpO2: 90 % O2 Device:SpO2: 90 % O2 Flow Rate: .   IO: Intake/output summary:  Intake/Output Summary (Last 24 hours) at 02/18/16 1109 Last data filed at 02/18/16 0631  Gross per 24 hour  Intake              470 ml  Output             1025 ml  Net             -555 ml    LBM: Last BM Date: 02/17/16 Baseline Weight: Weight: 85.7 kg (189 lb) Most recent weight: Weight: 87.3 kg (192 lb 7.4 oz)     Palliative Assessment/Data: PPS: 30%     Thank you for this consult. Palliative medicine will continue to follow and assist as needed.   Time In:1045 Time Out: 1120 Time Total: 35 minutes Greater than 50%  of this time was spent counseling and coordinating care related to the above assessment and plan.  Signed by: Mariana Kaufman, AGNP-C Palliative Medicine    Please contact Palliative Medicine Team phone at [phone] for questions and concerns.  For individual provider: See Shea Evans

## 2016-02-18 NOTE — Clinical Social Work Placement (Signed)
Patient has a bed at [facility]. CSW has completed FL2 & will continue to follow and assist with discharge when ready.    Raynaldo Opitz, [facility] Clinical Social Worker cell #: [phone]     CLINICAL SOCIAL WORK PLACEMENT  NOTE  Date:  02/18/2016  Patient Details  Name: [name] MRN: [id number] Date of Birth: November 16, 1930  Clinical Social Work is seeking post-discharge placement for this patient at the [place] level of care (*CSW will initial, date and re-position this form in  chart as items are completed):  Yes   Patient/family provided with [place] Work Department's list of facilities offering this level of care within the geographic area requested by the patient (or if unable, by the patient's family).  Yes   Patient/family informed of their freedom to choose among providers that offer the needed level of care, that participate in Medicare, Medicaid or managed care program needed by the patient, have an available bed and are willing to accept the patient.  Yes   Patient/family informed of [facility]'s ownership interest in [facility] and [facility], as well as of the fact that they are under no obligation to receive care at these facilities.  PASRR submitted to EDS on 02/18/16     PASRR number received on 02/18/16     Existing PASRR number confirmed on       FL2 transmitted to all facilities in geographic area requested by pt/family on 02/18/16     FL2 transmitted to all facilities within larger geographic area on       Patient informed that his/her managed care company has contracts with or will negotiate with certain facilities, including the following:        Yes   Patient/family informed of bed offers received.  Patient chooses bed at [place], [place]     Physician recommends and patient chooses bed at      Patient to be transferred to [place], [place] on  .  Patient to be transferred to facility by       Patient family notified on   of transfer.  Name of family member notified:        PHYSICIAN       Additional Comment:    _______________________________________________ Standley Brooking, LCSW 02/18/2016, 4:18 PM

## 2016-02-18 NOTE — Progress Notes (Signed)
PROGRESS NOTE    [name]  XFG:[id number] DOB: 29-Feb-1930 DOA: 02/14/2016 PCP: [name], MD    Brief Narrative:  82 y.o.malewith a history of lung CA, bladder CA s/p cystoprostatectomy with ileal neobladder 2002 and chronic indwelling foley, dementia, CVA, AFib and HTN brought to the ED 12/24 for confusion, foul-smelling urine, and back pain following a fall. He had an unwitnessed fall 2 days PTA with worsening lower back pain causing him to be unable/unwilling to walk (previously required 1-2 assist with walker). In the ED he appeared in no distress, AFib with rate in 50-60's, normotensive and afebrile. Oriented to himself and "[place]," which is near his usually baseline, but reported seeing spiders on the ceiling intermittently. WBC 12.2. Lactate normal. CT head showed no acute abnormalities. Urinalysis was grossly infected. CXR suggested rib fractures and pulmonary contusion vs. pneumonia which were not seen on subsequent CT chest. Lumbar and thoracic XR showed chronic T12 and L1 compression fractures. He was admitted for recurrent UTI, continued on ceftriaxone. Leukocytosis has failed to improve, so antipseudomonal cephalosporin was started. Physical therapy evaluation recommends SNF.  Assessment & Plan:   Principal Problem:   Urinary tract infection without hematuria Active Problems:   HLD (hyperlipidemia)   HYPERTENSION, BENIGN   Atrial fibrillation (HCC)   History of stroke   Low back pain   Depression   Encephalopathy, metabolic   Pressure injury of skin   UTI (urinary tract infection)   Altered mental status   Compression fracture of body of thoracic vertebra (HCC)   Goals of care, counseling/discussion   Palliative care by specialist   Acute low back pain   Urinary tract infection:  -UA from exchanged foley consistent with nitrite+ UTI in the setting of foul odor, leukocytosis without other obvious source, and N/V/AMS, will treat empirically and await  culture data.  -Chart reviewed. Patient has h/oUTI's due to E. coli, Pseudomonas, E. faecalis, acinetobacter, and most recently proteus, which was resistant to cipro and macrobid but all sensitive to zosyn - Pending urine cx - Patient is continued on zosyn per UTI history - Clinically showing some improvement -Leukocytosis slightly improved -Will check CBC in AM  -Plans for goals of care tomorrow with Palliative Care  Acute encephalopathy and chronic dementia:  -Suspect toxic-metabolic secondary to UTI -somewhat improved today   Fall at home:  -Initial concern for rib fractures on CXR, though CT chest negative. Has LBP and evidence of chronic T12 and L1 compression fractureson radiographs without acute fracture. Was discharged 11/8 from UTI admission with home health PT and 24-hr supervision by his granddaughter and 12hr/day aide.  - PT and OT evaluations recommend SNF. CSW consulted -Currently stable.  Atrial fibrillation:  -Remains rate controlled -Currently not on anticoagulation due to fall risk.  - For now, will continue metoprolol as tolerated - Had ~2 sec pause captured on telemetry, not significant unless > 5 sec with AFib.  -Remains stable at present  Vertebral compression fractures:  - Patient with chronic appearing fractures involving T12 and L1 per CT scans.  - Appreciate input by Palliative Care regarding pain management recommendations. Recs for Tylenol q6hrs with lidocaine patch with continued oxycodone  History of lung CA, bladder CA:  -Lung CA treated surgically in 2011. Cancer-free per urology s/p cystoprostatectomy and potsop chemotherapy, sees Dr. Jeffie Pollock. Has not seen Dr. Alvy Bimler since 2015.  - T-spine CT showed lytic lesion in T12 DDx schmorl's node, chronic osteomyelitis, or lytic metastasis. Case had been discussed with  oncology, Dr. Julien Nordmann who recommends follow up as outpatient, not a candidate for aggressive therapy even if this is malignant in  etiology. -Patient remains stable at present   HTN:  - Will continue norvasc, metoprolol  -Stable currently  Depression: - Continue SSRI and wellbutrin - Remains stable at present  DVT prophylaxis: Lovenox subQ Code Status: DNR Family Communication: Pt in room, family at bedside Disposition Plan: Uncertain at this time  Consultants:     Procedures:     Antimicrobials: Anti-infectives    Start     Dose/Rate Route Frequency Ordered Stop   02/17/16 1400  piperacillin-tazobactam (ZOSYN) IVPB 3.375 g     3.375 g 12.5 mL/hr over 240 Minutes Intravenous Every 8 hours 02/17/16 1125     02/15/16 1300  cefTAZidime (FORTAZ) 1 g in dextrose 5 % 50 mL IVPB  Status:  Discontinued     1 g 100 mL/hr over 30 Minutes Intravenous Every 8 hours 02/15/16 1234 02/17/16 1125   02/15/16 0600  vancomycin (VANCOCIN) IVPB 750 mg/150 ml premix  Status:  Discontinued     750 mg 150 mL/hr over 60 Minutes Intravenous Every 12 hours 02/14/16 1641 02/14/16 1838   02/15/16 0600  cefTRIAXone (ROCEPHIN) 1 g in dextrose 5 % 50 mL IVPB  Status:  Discontinued     1 g 100 mL/hr over 30 Minutes Intravenous Every 24 hours 02/14/16 1902 02/15/16 1200   02/14/16 1515  vancomycin (VANCOCIN) IVPB 1000 mg/200 mL premix     1,000 mg 200 mL/hr over 60 Minutes Intravenous  Once 02/14/16 1442 02/14/16 1724   02/14/16 1445  ceFEPIme (MAXIPIME) 1 g in dextrose 5 % 50 mL IVPB     1 g 100 mL/hr over 30 Minutes Intravenous  Once 02/14/16 1435 02/14/16 1605      Subjective: Without complaints today  Objective: Vitals:   02/17/16 1403 02/17/16 2030 02/18/16 0630 02/18/16 1359  BP: 134/63 (!) 143/72 127/78 115/69  Pulse: 65 98 93 76  Resp: '14 16 18 '$ (!) 24  Temp: 98.4 F (36.9 C) 98.5 F (36.9 C) 97.7 F (36.5 C) 98.5 F (36.9 C)  TempSrc: Oral Oral Oral Oral  SpO2: 91% 91% 90% 94%  Weight:      Height:        Intake/Output Summary (Last 24 hours) at 02/18/16 1652 Last data filed at 02/18/16 0631  Gross  per 24 hour  Intake              140 ml  Output             1025 ml  Net             -885 ml   Filed Weights   02/14/16 1141 02/14/16 1842  Weight: 85.7 kg (189 lb) 87.3 kg (192 lb 7.4 oz)    Examination:  General exam: Laying in bed, in nad, eating Respiratory system: Normal resp effort, no audible wheezing Cardiovascular system: regular rate, s1, s2 Gastrointestinal system: soft, nondistended, pos BS Central nervous system: cn2-12 grossly intact, strength intact Extremities: Perfused, no clubbing Skin: Normal skin turgor, no notable skin lesions seen Psychiatry: mood normal// no visual hallucinations.   Data Reviewed: I have personally reviewed following labs and imaging studies  CBC:  Recent Labs Lab 02/14/16 1248 02/15/16 0452 02/16/16 0442 02/17/16 0611 02/18/16 0455  WBC 12.2* 13.4* 13.7* 18.3* 17.3*  NEUTROABS 9.6*  --   --   --   --   HGB 14.1 14.7 14.7  14.7 14.6  HCT 43.5 45.6 45.7 44.9 45.1  MCV 91.0 90.5 91.0 90.5 90.7  PLT 167 179 162 188 174   Basic Metabolic Panel:  Recent Labs Lab 02/14/16 1248 02/15/16 0452 02/16/16 0442 02/18/16 0455  NA 140 140 139 141  K 3.8 3.8 3.9 3.9  CL 107 104 105 108  CO2 '26 27 25 24  '$ GLUCOSE 104* 115* 115* 123*  BUN 21* 23* 21* 26*  CREATININE 1.15 1.22 1.16 1.23  CALCIUM 8.7* 9.5 9.3 9.5  MG  --  1.9  --   --    GFR: Estimated Creatinine Clearance: 51.4 mL/min (by C-G formula based on SCr of 1.23 mg/dL). Liver Function Tests:  Recent Labs Lab 02/14/16 1248  AST 17  ALT 12*  ALKPHOS 47  BILITOT 1.0  PROT 6.3*  ALBUMIN 3.6   No results for input(s): LIPASE, AMYLASE in the last 168 hours. No results for input(s): AMMONIA in the last 168 hours. Coagulation Profile: No results for input(s): INR, PROTIME in the last 168 hours. Cardiac Enzymes: No results for input(s): CKTOTAL, CKMB, CKMBINDEX, TROPONINI in the last 168 hours. BNP (last 3 results) No results for input(s): PROBNP in the last 8760  hours. HbA1C: No results for input(s): HGBA1C in the last 72 hours. CBG:  Recent Labs Lab 02/16/16 1708  GLUCAP 141*   Lipid Profile: No results for input(s): CHOL, HDL, LDLCALC, TRIG, CHOLHDL, LDLDIRECT in the last 72 hours. Thyroid Function Tests: No results for input(s): TSH, T4TOTAL, FREET4, T3FREE, THYROIDAB in the last 72 hours. Anemia Panel: No results for input(s): VITAMINB12, FOLATE, FERRITIN, TIBC, IRON, RETICCTPCT in the last 72 hours. Sepsis Labs:  Recent Labs Lab 02/14/16 1545  LATICACIDVEN 0.83    Recent Results (from the past 240 hour(s))  Urine culture     Status: Abnormal   Collection Time: 02/14/16 12:53 PM  Result Value Ref Range Status   Specimen Description URINE, RANDOM  Final   Special Requests NONE  Final   Culture MULTIPLE SPECIES PRESENT, SUGGEST RECOLLECTION (A)  Final   Report Status 02/16/2016 FINAL  Final  Blood culture (routine x 2)     Status: None (Preliminary result)   Collection Time: 02/14/16  3:16 PM  Result Value Ref Range Status   Specimen Description BLOOD LEFT ANTECUBITAL  Final   Special Requests BOTTLES DRAWN AEROBIC AND ANAEROBIC 5 CC EACH  Final   Culture   Final    NO GROWTH 4 DAYS Performed at [facility]    Report Status PENDING  Incomplete  Blood culture (routine x 2)     Status: None (Preliminary result)   Collection Time: 02/14/16  3:35 PM  Result Value Ref Range Status   Specimen Description BLOOD LEFT HAND  Final   Special Requests BOTTLES DRAWN AEROBIC ONLY 5CC  Final   Culture   Final    NO GROWTH 4 DAYS Performed at [facility]    Report Status PENDING  Incomplete  Culture, Urine     Status: None   Collection Time: 02/16/16  5:00 PM  Result Value Ref Range Status   Specimen Description URINE, CLEAN CATCH  Final   Special Requests NONE  Final   Culture NO GROWTH Performed at [facility]   Final   Report Status 02/18/2016 FINAL  Final     Radiology Studies: No results  found.  Scheduled Meds: . acetaminophen  650 mg Oral Q6H  . amLODipine  5 mg Oral Daily  .  aspirin EC  81 mg Oral Daily  . atorvastatin  10 mg Oral QHS  . buPROPion  75 mg Oral Daily  . enoxaparin (LOVENOX) injection  40 mg Subcutaneous QHS  . feeding supplement (ENSURE ENLIVE)  237 mL Oral Daily  . HYDROcodone-acetaminophen  1 tablet Oral Q8H  . lidocaine  1 patch Transdermal Daily  . metoprolol succinate  25 mg Oral Daily  . phenazopyridine  100 mg Oral TID WC  . piperacillin-tazobactam (ZOSYN)  IV  3.375 g Intravenous Q8H  . saccharomyces boulardii  500 mg Oral Daily  . sertraline  100 mg Oral Daily   Continuous Infusions: . sodium chloride 75 mL/hr at 02/18/16 0919     LOS: 3 days   Jaymarion Trombly, Orpah Melter, MD [facility] Pager [phone]  If 7PM-7AM, please contact night-coverage [email or web address] Password [facility] 02/18/2016, 4:52 PM

## 2016-02-18 NOTE — Progress Notes (Signed)
Physical Therapy Treatment Patient Details Name: [name] MRN: [id number] DOB: 09-05-1930 Today's Date: 02/18/2016    History of Present Illness  82 y.o. male with a history of lung CA, bladder CA s/p cystoprostatectomy with ileal neobladder 2002 and chronic indwelling foley, dementia, CVA, AFib and HTN brought to the ED for confusion, foul-smelling urine, and back pain following a fall. Dx of UTI, Compression deformities of T12 and L1 of a chronic nature    PT Comments    Pt assisted to sitting EOB however fatigues very quickly and reported back pain throughout session.  Pt repositioned with pillows semisidelying end of session and stated he was more comfortable.  Follow Up Recommendations  SNF;Supervision/Assistance - 24 hour     Equipment Recommendations  None recommended by PT    Recommendations for Other Services       Precautions / Restrictions Precautions Precautions: Fall Restrictions Weight Bearing Restrictions: No    Mobility  Bed Mobility Overal bed mobility: Needs Assistance Bed Mobility: Supine to Sit;Sit to Sidelying     Supine to sit: Max assist;HOB elevated   Sit to sidelying: Max assist General bed mobility comments: pt attempting to assist with UEs however very weak and requiring increased assist, unable to tolerate/allow rolling to sidelying to sit so assisted supine to sit with increased trunk support, better with return to sidelying  Transfers                 General transfer comment: pt declined attempting, also would likely require significant assist at this time if not lift  Ambulation/Gait                 Stairs            Wheelchair Mobility    Modified Rankin (Stroke Patients Only)       Balance Overall balance assessment: Needs assistance Sitting-balance support: Single extremity supported;Feet supported Sitting balance-Leahy Scale: Poor Sitting balance - Comments: pt requires UE support, able to sit  min/guard with UE support briefly however requires external trunk support for any challenges                            Cognition Arousal/Alertness: Awake/alert Behavior During Therapy: WFL for tasks assessed/performed Overall Cognitive Status: No family/caregiver present to determine baseline cognitive functioning                 General Comments: h/o dementia    Exercises General Exercises - Lower Extremity Short Arc Quad: AROM;Seated;Both;5 reps (attempted however pt fatigued quickly)    General Comments        Pertinent Vitals/Pain Pain Assessment: Faces Faces Pain Scale: Hurts even more Pain Location: back pain with movement Pain Descriptors / Indicators: Grimacing;Guarding Pain Intervention(s): Limited activity within patient's tolerance;Monitored during session;Repositioned    Home Living                      Prior Function            PT Goals (current goals can now be found in the care plan section) Progress towards PT goals: Progressing toward goals    Frequency    Min 3X/week      PT Plan Current plan remains appropriate    Co-evaluation             End of Session   Activity Tolerance: Patient limited by pain Patient left: in bed;with call bell/phone within  reach;with bed alarm set     Time: 1011-1029 PT Time Calculation (min) (ACUTE ONLY): 18 min  Charges:  $Therapeutic Activity: 8-22 mins                    G Codes:      Rickeya Manus,KATHrine E 02/26/2016, 12:57 PM Carmelia Bake, PT, DPT 02/26/16 Pager: [phone]

## 2016-02-19 DIAGNOSIS — F0150 Vascular dementia without behavioral disturbance: Secondary | ICD-10-CM

## 2016-02-19 LAB — CBC
HCT: 43.3 % (ref 39.0–52.0)
Hemoglobin: 13.8 g/dL (ref 13.0–17.0)
MCH: 29.3 pg (ref 26.0–34.0)
MCHC: 31.9 g/dL (ref 30.0–36.0)
MCV: 91.9 fL (ref 78.0–100.0)
Platelets: 180 10*3/uL (ref 150–400)
RBC: 4.71 MIL/uL (ref 4.22–5.81)
RDW: 14.7 % (ref 11.5–15.5)
WBC: 13.8 10*3/uL — ABNORMAL HIGH (ref 4.0–10.5)

## 2016-02-19 LAB — CULTURE, BLOOD (ROUTINE X 2)
CULTURE: NO GROWTH
Culture: NO GROWTH

## 2016-02-19 LAB — BASIC METABOLIC PANEL
Anion gap: 8 (ref 5–15)
BUN: 26 mg/dL — ABNORMAL HIGH (ref 6–20)
CO2: 25 mmol/L (ref 22–32)
Calcium: 9.1 mg/dL (ref 8.9–10.3)
Chloride: 109 mmol/L (ref 101–111)
Creatinine, Ser: 1.13 mg/dL (ref 0.61–1.24)
GFR calc Af Amer: >60 mL/min (ref >60)
GFR calc non Af Amer: 58 mL/min — ABNORMAL LOW (ref >60)
Glucose, Bld: 110 mg/dL — ABNORMAL HIGH (ref 65–99)
Potassium: 4.2 mmol/L (ref 3.5–5.1)
Sodium: 142 mmol/L (ref 135–145)

## 2016-02-19 NOTE — Progress Notes (Signed)
Daily Progress Note   Patient Name: [name]       Date: 02/19/2016 DOB: 1931/01/02  Age: 82 y.o. MRN#: [id number] Attending Physician: Dennis Hazel, MD Primary Care Physician: Dennis Redwood, MD Admit Date: 02/14/2016  Reason for Consultation/Follow-up: Establishing goals of care  Subjective:  Patient states his back is some better but he has severe pain if he tries to stand up or move.  I met with [name]) and [name] ([place] and [place] daughter)  in the conference room.  I introduced palliative medicine as specialized medical care for people living with serious illness. It focuses on providing relief from the symptoms and stress of a serious illness. The goal is to improve quality of life for both the patient and the family.  We discussed [name] health history - particularly his recurrent falls and recurrent UTIs.  The family understands that his UTIs are now multi drug resistant and will likely be untreatable by antibiotics in the future.  [name] and [name] agree that the patient would never want to be in a SNF.  And that he does not want to keep falling and having recurrent infections.    [name] feels strongly that it is time to choose a comfort path and engage hospice services. They have been in a cycle of recurrent falls/UTIs and hospitalizations for 2 years.  He is now eating very little and essentially bed bound.  She feels her husband does not want to go on living / declining further.     [name] feels strongly that when her grandfather is not ill - he still has enjoyment in life.  She feels she should keep giving him full medical support as long as possible (he is a DNR).  We discussed whether or not we were "prolonging the dying process" and we discussed  what comfort care and hospice entails.  We completed a MOST form - DNR, Comfort Measures - do not return to the hospital unless comfort needs can not be met.  No further antibiotics.   No feeding tube.    [name] signed the form and copies were made.  The plan is for [name] to go to [facility] for Rehab.   However the plan is not to bring him back to the hospital, but rather to engage hospice when  he either (1) finishes acute rehab or (2) when he develops another infection which ever happens first.   Both [name] and [name] want [name] to pass at [facility] and not in a SNF.   Length of Stay: 4  Current Medications: Scheduled Meds:  . acetaminophen  650 mg Oral Q6H  . amLODipine  5 mg Oral Daily  . aspirin EC  81 mg Oral Daily  . atorvastatin  10 mg Oral QHS  . buPROPion  75 mg Oral Daily  . enoxaparin (LOVENOX) injection  40 mg Subcutaneous QHS  . feeding supplement (ENSURE ENLIVE)  237 mL Oral Daily  . HYDROcodone-acetaminophen  1 tablet Oral Q8H  . lidocaine  1 patch Transdermal Daily  . metoprolol succinate  25 mg Oral Daily  . piperacillin-tazobactam (ZOSYN)  IV  3.375 g Intravenous Q8H  . saccharomyces boulardii  500 mg Oral Daily  . sertraline  100 mg Oral Daily    Continuous Infusions: . sodium chloride 75 mL/hr at 02/18/16 2228    PRN Meds: hydrocortisone cream, morphine CONCENTRATE, oxyCODONE, pantoprazole  Physical Exam  Constitutional: He appears well-developed and well-nourished.  HENT:  Head: Normocephalic and atraumatic.  Eyes: EOM are normal. No scleral icterus.  Neck: Normal range of motion. Neck supple.  Cardiovascular: Normal rate.   Murmur heard. Pulmonary/Chest: Effort normal. No respiratory distress.  Abdominal: Soft. He exhibits no distension. There is no tenderness.  Musculoskeletal: He exhibits no edema or deformity.  Neurological: He is alert.  Orientated to person, not to place or time.  Skin: Skin is warm and dry.    Psychiatric: He has a normal mood and affect.  Confused.  Pleasantly demented.    Vital Signs: BP 128/72 (BP Location: Right Arm)   Pulse 74   Temp 97.7 F (36.5 C) (Oral)   Resp 20   Ht 6' 1" (1.854 m)   Wt 87.3 kg (192 lb 7.4 oz)   SpO2 94%   BMI 25.39 kg/m  SpO2: SpO2: 94 % O2 Device: O2 Device: Not Delivered O2 Flow Rate:    Intake/output summary:  Intake/Output Summary (Last 24 hours) at 02/19/16 0953 Last data filed at 02/19/16 9702  Gross per 24 hour  Intake          2821.25 ml  Output             1000 ml  Net          1821.25 ml   LBM: Last BM Date: 02/17/16 Baseline Weight: Weight: 85.7 kg (189 lb) Most recent weight: Weight: 87.3 kg (192 lb 7.4 oz)       Palliative Assessment/Data:      Patient Active Problem List   Diagnosis Date Noted  . Altered mental status   . Compression fracture of body of thoracic vertebra (HCC)   . Goals of care, counseling/discussion   . Palliative care by specialist   . Acute low back pain   . Pressure injury of skin 02/15/2016  . UTI (urinary tract infection) 02/15/2016  . History of recurrent UTIs 12/29/2015  . History of bladder cancer 12/29/2015  . Subacute confusional state 12/29/2015  . Generalized weakness 12/29/2015  . Confusion 12/29/2015  . Urinary tract infection without hematuria 08/07/2015  . Pyelonephritis 07/22/2015  . E. coli UTI 07/22/2015  . Leukocytosis 07/22/2015  . Dehydration 07/22/2015  . Rectal bleeding 07/22/2015  . Pressure ulcer 05/31/2015  . Hip pain   . Encephalopathy, metabolic   . GERD (gastroesophageal reflux  disease) 08/19/2014  . Depression 08/19/2014  . UTI (lower urinary tract infection) 08/19/2014  . UTI (urinary tract infection) due to Enterococcus faecium 05/21/2014  . Bradycardia 05/03/2012  . Fall 04/30/2012  . Low back pain 04/30/2012  . Lung cancer ([place]) 10/10/2011  . Atrial fibrillation ([place]) 10/10/2011  . History of stroke 10/10/2011  . Malignant neoplasm of  bladder ([place]) 03/13/2008  . HLD (hyperlipidemia) 03/13/2008  . HYPERTENSION, BENIGN 02/13/2008    Palliative Care Assessment & Plan   Patient Profile: 82 y.o. Dennis  with past medical history of lung cancer (remote, s/p chemotherapy and lung resection), bladder cancer (remote,s/p cystoprostatectomy w/ neobladder and chronic foleyp), dementia, CVA, aFib, HTN admitted on 02/14/2016 with confusion and fall, workup revealed compression fractures and multi drug resistant UTI. Palliative medicine consulted for symptom management and GOC.   Assessment: Pleasantly demented 82 yo Dennis with recurrent falls and severe back pain from compression fractures.  Eating little.  Supportive family.  Recurrent MDR UTIs due to chronic foley.   [place] daughter (Primary care taker and POA) is still having difficulty letting go and accepting full comfort care path.  Recommendations/Plan:  Hospice eligible now - but will wait to engage hospice services until he completes PT Rehab at SNF.  MOST form completed.  Do not return to the hospital unless its for comfort:  No further antibiotic therapy after this hospitalization.  Please put in D/C summary - Palliative to follow at SNF - Family requests Hospice and palliative care of [facility] as they want him at [facility] place at end of life.  If he graduates from [facility] PT acute rehab without another infection - grand daughter will consider taking him home again with hospice support at home.  Goals of Care and Additional Recommendations:  Limitations on Scope of Treatment: Full Comfort Care  Code Status:  DNR  Prognosis:   < 3 months secondary to recurrent MDR UTIs, recurrent falls, little PO intake.  Discharge Planning:  [place] for rehab with Palliative care service follow-up  Care plan was discussed with [facility] MD, patient and family.  Thank you for allowing the Palliative Medicine Team to assist in the care of this patient.   Time In: 9:30  Time Out: 11:00 Total Time 90 Prolonged Time Billed yes      Greater than 50%  of this time was spent counseling and coordinating care related to the above assessment and plan.  Imogene Burn, PA-C Palliative Medicine   Please contact Palliative MedicineTeam phone at [phone] for questions and concerns.   Please see AMION for individual provider pager numbers.

## 2016-02-19 NOTE — Progress Notes (Signed)
Pt has been A-Fib on tele, monitor tech called and stated that pt's RR=2.19, MD was notified, order given for EKG and the Nurse tech is getting it now, report has been given to on-coming rn----Keysha Damewood, rn

## 2016-02-19 NOTE — Care Management Important Message (Signed)
Important Message  Patient Details  Name: [name] MRN: [id number] Date of Birth: Feb 24, 1930   Medicare Important Message Given:  Yes    Kerin Salen 02/19/2016, 2:25 [place] Message  Patient Details  Name: [name] MRN: [id number] Date of Birth: Sep 14, 1930   Medicare Important Message Given:  Yes    Kerin Salen 02/19/2016, 2:25 PM

## 2016-02-19 NOTE — Progress Notes (Addendum)
PROGRESS NOTE    [name]  LGX:[id number] DOB: 06-Jun-1930 DOA: 02/14/2016 PCP: [name], MD    Brief Narrative:  82 y.o.malewith a history of lung CA, bladder CA s/p cystoprostatectomy with ileal neobladder 2002 and chronic indwelling foley, dementia, CVA, AFib and HTN brought to the ED 12/24 for confusion, foul-smelling urine, and back pain following a fall. He had an unwitnessed fall 2 days PTA with worsening lower back pain causing him to be unable/unwilling to walk (previously required 1-2 assist with walker). In the ED he appeared in no distress, AFib with rate in 50-60's, normotensive and afebrile. Oriented to himself and "[place]," which is near his usually baseline, but reported seeing spiders on the ceiling intermittently. WBC 12.2. Lactate normal. CT head showed no acute abnormalities. Urinalysis was grossly infected. CXR suggested rib fractures and pulmonary contusion vs. pneumonia which were not seen on subsequent CT chest. Lumbar and thoracic XR showed chronic T12 and L1 compression fractures. He was admitted for recurrent UTI, continued on ceftriaxone. Leukocytosis has failed to improve, so antipseudomonal cephalosporin was started. Physical therapy evaluation recommends SNF.  During this hospital course, patient showed no improvement with cephalosporin. Pt was started on zosyn with clinical improvement. Pt was seen by Palliative Care per family request for Goals of Care. MOST form filled out and plans for SNF with hospice at discharge  Assessment & Plan:   Principal Problem:   Urinary tract infection without hematuria Active Problems:   HLD (hyperlipidemia)   HYPERTENSION, BENIGN   Atrial fibrillation (HCC)   History of stroke   Low back pain   Depression   Encephalopathy, metabolic   Pressure injury of skin   UTI (urinary tract infection)   Altered mental status   Compression fracture of body of thoracic vertebra (HCC)   Goals of care,  counseling/discussion   Palliative care by specialist   Acute low back pain   Vascular dementia without behavioral disturbance   Urinary tract infection:  -UA from exchanged foley consistent with nitrite+ UTI in the setting of foul odor, leukocytosis without other obvious source, and N/V/AMS, will treat empirically and await culture data.  -Chart reviewed. Patient has h/oUTI's due to E. coli, Pseudomonas, E. faecalis, acinetobacter, and most recently proteus, which was resistant to cipro and macrobid but all sensitive to zosyn - Urine cx from 12/26 without growth - No significant improvement initially with fortaz and rocephin - Patient is now continued on zosyn per UTI history with clinical improvement - Leukocytosis is improving -Will repeat CBC in AM  -Anticipate trial of fosfomycin at time of d/c to complete course -Appreciate input by Palliative Care. Recommendations for hospice at SNF. MOST form signed  Acute encephalopathy and chronic dementia:  -Suspect toxic-metabolic secondary to UTI -appears improved  Fall at home:  -Initial concern for rib fractures on CXR, though CT chest negative. Has LBP and evidence of chronic T12 and L1 compression fractureson radiographs without acute fracture. Was discharged 11/8 from UTI admission with home health PT and 24-hr supervision by his granddaughter and 12hr/day aide.  - PT and OT evaluations recommend SNF. CSW consulted -Remains stable.  Atrial fibrillation:  -Remains rate controlled -Currently not on anticoagulation due to fall risk.  - For now, will continue metoprolol as tolerated - Had ~2 sec pause captured on telemetry, not significant unless > 5 sec with AFib.  -currently stable  Vertebral compression fractures:  - Patient with chronic appearing fractures involving T12 and L1 per CT scans.  -  Appreciate input by Palliative Care regarding pain management recommendations. Recs for Tylenol q6hrs with lidocaine patch with  continued oxycodone -Stable at present  History of lung CA, bladder CA:  -Lung CA treated surgically in 2011. Cancer-free per urology s/p cystoprostatectomy and potsop chemotherapy, sees Dr. Jeffie Pollock. Has not seen Dr. Alvy Bimler since 2015.  - T-spine CT showed lytic lesion in T12 DDx schmorl's node, chronic osteomyelitis, or lytic metastasis. Case had been discussed with oncology, Dr. Julien Nordmann who recommends follow up as outpatient, not a candidate for aggressive therapy even if this is malignant in etiology. -currently stable  HTN:  - Will continue norvasc, metoprolol  -remains stable  Depression: - Continue SSRI and wellbutrin - stable at present  DVT prophylaxis: Lovenox subQ Code Status: DNR Family Communication: Pt in room, family not currently at bedside Disposition Plan: Anticipate SNF in 24-48hrs  Consultants:   Palliative Care  Procedures:     Antimicrobials: Anti-infectives    Start     Dose/Rate Route Frequency Ordered Stop   02/17/16 1400  piperacillin-tazobactam (ZOSYN) IVPB 3.375 g     3.375 g 12.5 mL/hr over 240 Minutes Intravenous Every 8 hours 02/17/16 1125     02/15/16 1300  cefTAZidime (FORTAZ) 1 g in dextrose 5 % 50 mL IVPB  Status:  Discontinued     1 g 100 mL/hr over 30 Minutes Intravenous Every 8 hours 02/15/16 1234 02/17/16 1125   02/15/16 0600  vancomycin (VANCOCIN) IVPB 750 mg/150 ml premix  Status:  Discontinued     750 mg 150 mL/hr over 60 Minutes Intravenous Every 12 hours 02/14/16 1641 02/14/16 1838   02/15/16 0600  cefTRIAXone (ROCEPHIN) 1 g in dextrose 5 % 50 mL IVPB  Status:  Discontinued     1 g 100 mL/hr over 30 Minutes Intravenous Every 24 hours 02/14/16 1902 02/15/16 1200   02/14/16 1515  vancomycin (VANCOCIN) IVPB 1000 mg/200 mL premix     1,000 mg 200 mL/hr over 60 Minutes Intravenous  Once 02/14/16 1442 02/14/16 1724   02/14/16 1445  ceFEPIme (MAXIPIME) 1 g in dextrose 5 % 50 mL IVPB     1 g 100 mL/hr over 30 Minutes Intravenous   Once 02/14/16 1435 02/14/16 1605      Subjective: Feels better today  Objective: Vitals:   02/18/16 2100 02/19/16 0502 02/19/16 0845 02/19/16 1335  BP: 112/68 128/72  135/64  Pulse: 77 73 74 63  Resp: (!) '22 20  16  '$ Temp: 98.7 F (37.1 C) 97.7 F (36.5 C)  98.4 F (36.9 C)  TempSrc: Oral Oral  Oral  SpO2: 95% 94%  91%  Weight:      Height:        Intake/Output Summary (Last 24 hours) at 02/19/16 1513 Last data filed at 02/19/16 1507  Gross per 24 hour  Intake             4495 ml  Output             1650 ml  Net             2845 ml   Filed Weights   02/14/16 1141 02/14/16 1842  Weight: 85.7 kg (189 lb) 87.3 kg (192 lb 7.4 oz)    Examination:  General exam: Awake, conversant, in nad Respiratory system: normal chest rise, clear on auscultation Cardiovascular system: regular rhythm, s1-s2 on auscultation Gastrointestinal system: nondistended, pos BS, no masses Central nervous system: no seizures, no tremors Extremities: no cyanosis, no joint deformities Skin:  no pallor, no rashes Psychiatry: affect normal// no auditory hallucinations.   Data Reviewed: I have personally reviewed following labs and imaging studies  CBC:  Recent Labs Lab 02/14/16 1248 02/15/16 0452 02/16/16 0442 02/17/16 0611 02/18/16 0455 02/19/16 0410  WBC 12.2* 13.4* 13.7* 18.3* 17.3* 13.8*  NEUTROABS 9.6*  --   --   --   --   --   HGB 14.1 14.7 14.7 14.7 14.6 13.8  HCT 43.5 45.6 45.7 44.9 45.1 43.3  MCV 91.0 90.5 91.0 90.5 90.7 91.9  PLT 167 179 162 188 180 106   Basic Metabolic Panel:  Recent Labs Lab 02/14/16 1248 02/15/16 0452 02/16/16 0442 02/18/16 0455 02/19/16 0410  NA 140 140 139 141 142  K 3.8 3.8 3.9 3.9 4.2  CL 107 104 105 108 109  CO2 '26 27 25 24 25  '$ GLUCOSE 104* 115* 115* 123* 110*  BUN 21* 23* 21* 26* 26*  CREATININE 1.15 1.22 1.16 1.23 1.13  CALCIUM 8.7* 9.5 9.3 9.5 9.1  MG  --  1.9  --   --   --    GFR: Estimated Creatinine Clearance: 56 mL/min (by C-G  formula based on SCr of 1.13 mg/dL). Liver Function Tests:  Recent Labs Lab 02/14/16 1248  AST 17  ALT 12*  ALKPHOS 47  BILITOT 1.0  PROT 6.3*  ALBUMIN 3.6   No results for input(s): LIPASE, AMYLASE in the last 168 hours. No results for input(s): AMMONIA in the last 168 hours. Coagulation Profile: No results for input(s): INR, PROTIME in the last 168 hours. Cardiac Enzymes: No results for input(s): CKTOTAL, CKMB, CKMBINDEX, TROPONINI in the last 168 hours. BNP (last 3 results) No results for input(s): PROBNP in the last 8760 hours. HbA1C: No results for input(s): HGBA1C in the last 72 hours. CBG:  Recent Labs Lab 02/16/16 1708  GLUCAP 141*   Lipid Profile: No results for input(s): CHOL, HDL, LDLCALC, TRIG, CHOLHDL, LDLDIRECT in the last 72 hours. Thyroid Function Tests: No results for input(s): TSH, T4TOTAL, FREET4, T3FREE, THYROIDAB in the last 72 hours. Anemia Panel: No results for input(s): VITAMINB12, FOLATE, FERRITIN, TIBC, IRON, RETICCTPCT in the last 72 hours. Sepsis Labs:  Recent Labs Lab 02/14/16 1545  LATICACIDVEN 0.83    Recent Results (from the past 240 hour(s))  Urine culture     Status: Abnormal   Collection Time: 02/14/16 12:53 PM  Result Value Ref Range Status   Specimen Description URINE, RANDOM  Final   Special Requests NONE  Final   Culture MULTIPLE SPECIES PRESENT, SUGGEST RECOLLECTION (A)  Final   Report Status 02/16/2016 FINAL  Final  Blood culture (routine x 2)     Status: None   Collection Time: 02/14/16  3:16 PM  Result Value Ref Range Status   Specimen Description BLOOD LEFT ANTECUBITAL  Final   Special Requests BOTTLES DRAWN AEROBIC AND ANAEROBIC 5 CC EACH  Final   Culture   Final    NO GROWTH 5 DAYS Performed at [facility]    Report Status 02/19/2016 FINAL  Final  Blood culture (routine x 2)     Status: None   Collection Time: 02/14/16  3:35 PM  Result Value Ref Range Status   Specimen Description BLOOD LEFT HAND   Final   Special Requests BOTTLES DRAWN AEROBIC ONLY 5CC  Final   Culture   Final    NO GROWTH 5 DAYS Performed at [facility]    Report Status 02/19/2016 FINAL  Final  Culture, Urine     Status: None   Collection Time: 02/16/16  5:00 PM  Result Value Ref Range Status   Specimen Description URINE, CLEAN CATCH  Final   Special Requests NONE  Final   Culture NO GROWTH Performed at [facility]   Final   Report Status 02/18/2016 FINAL  Final     Radiology Studies: No results found.  Scheduled Meds: . acetaminophen  650 mg Oral Q6H  . amLODipine  5 mg Oral Daily  . aspirin EC  81 mg Oral Daily  . atorvastatin  10 mg Oral QHS  . buPROPion  75 mg Oral Daily  . enoxaparin (LOVENOX) injection  40 mg Subcutaneous QHS  . feeding supplement (ENSURE ENLIVE)  237 mL Oral Daily  . HYDROcodone-acetaminophen  1 tablet Oral Q8H  . lidocaine  1 patch Transdermal Daily  . metoprolol succinate  25 mg Oral Daily  . piperacillin-tazobactam (ZOSYN)  IV  3.375 g Intravenous Q8H  . saccharomyces boulardii  500 mg Oral Daily  . sertraline  100 mg Oral Daily   Continuous Infusions: . sodium chloride 75 mL/hr at 02/18/16 2228     LOS: 4 days   Janea Schwenn, Orpah Melter, MD [facility] Pager [phone]  If 7PM-7AM, please contact night-coverage [email or web address] Password [email or web address] 02/19/2016, 3:13 PM

## 2016-02-20 DIAGNOSIS — R339 Retention of urine, unspecified: Secondary | ICD-10-CM | POA: Diagnosis not present

## 2016-02-20 DIAGNOSIS — G9340 Encephalopathy, unspecified: Secondary | ICD-10-CM | POA: Diagnosis not present

## 2016-02-20 DIAGNOSIS — R41841 Cognitive communication deficit: Secondary | ICD-10-CM | POA: Diagnosis not present

## 2016-02-20 DIAGNOSIS — K219 Gastro-esophageal reflux disease without esophagitis: Secondary | ICD-10-CM | POA: Diagnosis not present

## 2016-02-20 DIAGNOSIS — R627 Adult failure to thrive: Secondary | ICD-10-CM | POA: Diagnosis not present

## 2016-02-20 DIAGNOSIS — I482 Chronic atrial fibrillation: Secondary | ICD-10-CM

## 2016-02-20 DIAGNOSIS — Z9181 History of falling: Secondary | ICD-10-CM | POA: Diagnosis not present

## 2016-02-20 DIAGNOSIS — Z8551 Personal history of malignant neoplasm of bladder: Secondary | ICD-10-CM | POA: Diagnosis not present

## 2016-02-20 DIAGNOSIS — R451 Restlessness and agitation: Secondary | ICD-10-CM | POA: Diagnosis not present

## 2016-02-20 DIAGNOSIS — F0150 Vascular dementia without behavioral disturbance: Secondary | ICD-10-CM | POA: Diagnosis not present

## 2016-02-20 DIAGNOSIS — R531 Weakness: Secondary | ICD-10-CM | POA: Diagnosis not present

## 2016-02-20 DIAGNOSIS — Z85118 Personal history of other malignant neoplasm of bronchus and lung: Secondary | ICD-10-CM | POA: Diagnosis not present

## 2016-02-20 DIAGNOSIS — M4854XA Collapsed vertebra, not elsewhere classified, thoracic region, initial encounter for fracture: Secondary | ICD-10-CM

## 2016-02-20 DIAGNOSIS — W19XXXA Unspecified fall, initial encounter: Secondary | ICD-10-CM | POA: Diagnosis not present

## 2016-02-20 DIAGNOSIS — F0390 Unspecified dementia without behavioral disturbance: Secondary | ICD-10-CM | POA: Diagnosis not present

## 2016-02-20 DIAGNOSIS — I4891 Unspecified atrial fibrillation: Secondary | ICD-10-CM | POA: Diagnosis not present

## 2016-02-20 DIAGNOSIS — N390 Urinary tract infection, site not specified: Secondary | ICD-10-CM

## 2016-02-20 DIAGNOSIS — G309 Alzheimer's disease, unspecified: Secondary | ICD-10-CM | POA: Diagnosis not present

## 2016-02-20 DIAGNOSIS — M6281 Muscle weakness (generalized): Secondary | ICD-10-CM | POA: Diagnosis not present

## 2016-02-20 DIAGNOSIS — R279 Unspecified lack of coordination: Secondary | ICD-10-CM | POA: Diagnosis not present

## 2016-02-20 DIAGNOSIS — I10 Essential (primary) hypertension: Secondary | ICD-10-CM

## 2016-02-20 DIAGNOSIS — F329 Major depressive disorder, single episode, unspecified: Secondary | ICD-10-CM | POA: Diagnosis not present

## 2016-02-20 DIAGNOSIS — R2681 Unsteadiness on feet: Secondary | ICD-10-CM | POA: Diagnosis not present

## 2016-02-20 DIAGNOSIS — F339 Major depressive disorder, recurrent, unspecified: Secondary | ICD-10-CM | POA: Diagnosis not present

## 2016-02-20 DIAGNOSIS — C679 Malignant neoplasm of bladder, unspecified: Secondary | ICD-10-CM | POA: Diagnosis not present

## 2016-02-20 LAB — CBC
HCT: 41.3 % (ref 39.0–52.0)
Hemoglobin: 13.6 g/dL (ref 13.0–17.0)
MCH: 30 pg (ref 26.0–34.0)
MCHC: 32.9 g/dL (ref 30.0–36.0)
MCV: 91 fL (ref 78.0–100.0)
PLATELETS: 210 10*3/uL (ref 150–400)
RBC: 4.54 MIL/uL (ref 4.22–5.81)
RDW: 14.6 % (ref 11.5–15.5)
WBC: 13.4 10*3/uL — AB (ref 4.0–10.5)

## 2016-02-20 LAB — BASIC METABOLIC PANEL
Anion gap: 6 (ref 5–15)
BUN: 24 mg/dL — AB (ref 6–20)
CALCIUM: 9.2 mg/dL (ref 8.9–10.3)
CHLORIDE: 109 mmol/L (ref 101–111)
CO2: 25 mmol/L (ref 22–32)
CREATININE: 1.08 mg/dL (ref 0.61–1.24)
GFR calc non Af Amer: >60 mL/min (ref >60)
Glucose, Bld: 99 mg/dL (ref 65–99)
Potassium: 3.6 mmol/L (ref 3.5–5.1)
SODIUM: 140 mmol/L (ref 135–145)

## 2016-02-20 MED ORDER — MORPHINE SULFATE (CONCENTRATE) 10 MG/0.5ML PO SOLN: 2.5000 mg | ORAL | 0 refills | Status: DC | PRN

## 2016-02-20 MED ORDER — HYDROCODONE-ACETAMINOPHEN 5-325 MG PO TABS: 1.0000 | ORAL_TABLET | Freq: Three times a day (TID) | ORAL | 0 refills | Status: AC

## 2016-02-20 MED ORDER — FOSFOMYCIN TROMETHAMINE 3 G PO PACK
3.0000 g | PACK | Freq: Once | ORAL | Status: AC
  Administered 2016-02-20: 3 g via ORAL
  Filled 2016-02-20: qty 3

## 2016-02-20 MED ORDER — HYDROCODONE-ACETAMINOPHEN 5-325 MG PO TABS: 1.0000 | ORAL_TABLET | Freq: Three times a day (TID) | ORAL | 0 refills | Status: DC

## 2016-02-20 MED ORDER — LIDOCAINE 5 % EX PTCH: 1.0000 | MEDICATED_PATCH | Freq: Every day | CUTANEOUS | 0 refills | Status: AC

## 2016-02-20 MED ORDER — ACETAMINOPHEN 325 MG PO TABS: 650.0000 mg | ORAL_TABLET | Freq: Four times a day (QID) | ORAL | Status: AC

## 2016-02-20 MED ORDER — MORPHINE SULFATE (CONCENTRATE) 10 MG/0.5ML PO SOLN: 2.5000 mg | ORAL | 0 refills | Status: AC | PRN

## 2016-02-20 NOTE — Progress Notes (Signed)
NT reporting a large amount of leakage around foley catheter.  Bed pads soaked.  Pt grand daughter stated that per Pts urology MD, his catheter should be flushed BID.  No order to flush listed in the chart.  MD made aware and order placed to flush catheter.  Catheter flushed with NS with 50 cc's of NS. After flushing there was a return of yellow urine and multiple clots and sediment.  After catheter flushed, pt is no longer having leakage around foley.  MD made aware to place order for catheter to be flushed at facilty when patient discharged.

## 2016-02-20 NOTE — Clinical Social Work Placement (Signed)
   CLINICAL SOCIAL WORK PLACEMENT  NOTE  Date:  02/20/2016  Patient Details  Name: [name] MRN: [id number] Date of Birth: 1931/01/07  Clinical Social Work is seeking post-discharge placement for this patient at the [place] level of care (*CSW will initial, date and re-position this form in  chart as items are completed):  Yes   Patient/family provided with [facility] Work Department's list of facilities offering this level of care within the geographic area requested by the patient (or if unable, by the patient's family).  Yes   Patient/family informed of their freedom to choose among providers that offer the needed level of care, that participate in Medicare, Medicaid or managed care program needed by the patient, have an available bed and are willing to accept the patient.  Yes   Patient/family informed of 's ownership interest in [facility] and [facility], as well as of the fact that they are under no obligation to receive care at these facilities.  PASRR submitted to EDS on 02/18/16     PASRR number received on 02/18/16     Existing PASRR number confirmed on       FL2 transmitted to all facilities in geographic area requested by pt/family on 02/18/16     FL2 transmitted to all facilities within larger geographic area on       Patient informed that his/her managed care company has contracts with or will negotiate with certain facilities, including the following:   (NA)     Yes   Patient/family informed of bed offers received.  Patient chooses bed at [place] [place]     Physician recommends and patient chooses bed at      Patient to be transferred to [place] on 02/20/16.  Patient to be transferred to facility by Ambulance [facility])     Patient family notified on 02/20/16 of transfer.  Name of family member notified:  Lorie Phenix. [place], [place], [phone]  (weekend coverage)      PHYSICIAN       Additional Comment:  Nursing notified to call report and d/c summary sent to facility.  Spoke with Nira Conn, Admissions at SNF who indicated that bed is available.  Patient is alert to person only; granddaughter is aware of d/c and left the hospital to go to the nursing home to meet patient per nursing.  EMS contacted.  No further SW intervention indicated. SW signing off.     _______________________________________________ Williemae Area, LCSW 02/20/2016, 1:23 PM

## 2016-02-20 NOTE — Progress Notes (Signed)
Report called to [facility] at [facility].  All questions answered.  Pt will be discharged via [facility].

## 2016-02-20 NOTE — Discharge Summary (Addendum)
Physician Discharge Summary  BRYSUN ESCHMANN HQP:[id number] DOB: [birth year] DOA: 02/14/2016  PCP: [name], MD  Admit date: 02/14/2016 Discharge date: 02/20/2016   Recommendations for Outpatient Follow-Up:   To sNF palliative to follow at SNF -watch amount of tylenol intake Flush foley BID  Discharge Diagnosis:   Principal Problem:   Urinary tract infection without hematuria Active Problems:   HLD (hyperlipidemia)   HYPERTENSION, BENIGN   Atrial fibrillation (HCC)   History of stroke   Low back pain   Depression   Encephalopathy, metabolic   Pressure injury of skin   UTI (urinary tract infection)   Altered mental status   Compression fracture of body of thoracic vertebra (HCC)   Goals of care, counseling/discussion   Palliative care by specialist   Acute low back pain   Vascular dementia without behavioral disturbance   Discharge disposition:  SNF:  Discharge Condition: Improved.  Diet recommendation: DYS 2 Wound care: None.   History of Present Illness:   [name] is a 82 y.o. male with a history of lung CA, bladder CA s/p cystoprostatectomy with ileal neobladder 2002 and chronic indwelling foley, dementia, CVA, AFib and HTN brought to the ED for confusion, foul-smelling urine, and back pain following a fall.   Due to dementia and encephalopathy, patient unable to provide history, which is instead obtained from wife, daughter, and medical records. They report that he lives with his granddaughter since being diagnosed with dementia several years ago and has an aide every day until he is put to bed. 2 nights ago his bed alarm went off and his granddaughter found him on the floor. He denied head trauma, headache, or LOC. Primary complaint since that time has been low back pain that is worsening, nonradiating, severe, causing him to be unwilling to walk (previously required 1-2 assist with walker). He has also begun to see spiders on ceilings and  other hallucinations. This confusion, along with foul-smelling urine which they also report, usually occurs with UTIs, to which he is prone. They deny any facial droop, slurred speech, or focal weakness. In the ED he appeared in no distress, AFib with rate in 50-60's, normotensive and afebrile. Oriented to himself and "Boyd," which is near his usually baseline. WBC 12.2. Lactate normal. CT head showed no acute abnormalities. Urinalysis was grossly infected. CXR suggested rib fractures and pulmonary contusion vs. pneumonia which were not seen on subsequent CT chest. Lumbar XR showed chronic T12 and L1 compression fractures. Thoracic radiographs are pending.   Hospital Course by Problem:   Urinary tract infection:  -UA from exchanged foley consistent with nitrite+ UTI in the setting of foul odor, leukocytosis without other obvious source, and N/V/AMS, will treat empirically and await culture data.  -Chart reviewed. Patient has h/oUTI's due to E. coli, Pseudomonas, E. faecalis, acinetobacter, and most recently proteus, which was resistant to cipro and macrobid but all sensitive to zosyn - Urine cx from 12/26 without growth - No significant improvement initially with fortaz and rocephin - Patient is now continued on zosyn per UTI history with clinical improvement - fosfomycin at time of d/c to complete course -Appreciate input by Palliative Care. Recommendations for palliative care at SNF. MOST form signed  Acute encephalopathy and chronic dementia:  -Suspect toxic-metabolic secondary to UTI -appears improved  Fall at home:  -Initial concern for rib fractures on CXR, though CT chest negative. Has LBP and evidence of chronic T12 and L1 compression fractureson radiographs without acute fracture. Was  discharged 11/8 from UTI admission with home health PT and 24-hr supervision by his granddaughter and 12hr/day aide.  - PT and OT evaluations recommend SNF.   Atrial fibrillation:  -Remains  rate controlled -Currently not on anticoagulation due to fall risk.  - For now, will continue metoprolol as tolerated - Had ~2 sec pause captured on telemetry, not significant unless > 5 sec with AFib.  -currently stable  Vertebral compression fractures:  - Patient with chronic appearing fractures involving T12 and L1 per CT scans.  - Appreciate input by Palliative Care regarding pain management recommendations. Recs for Tylenol q6hrs with lidocaine patch with continued oxycodone -Stable at present  History of lung CA, bladder CA:  -Lung CA treated surgically in 2011. Cancer-free per urology s/p cystoprostatectomy and potsop chemotherapy, sees Dr. Jeffie Pollock. Has not seen Dr. Alvy Bimler since 2015.  - T-spine CT showed lytic lesion in T12 DDx schmorl's node, chronic osteomyelitis, or lytic metastasis. Case had been discussed with oncology, Dr. Julien Nordmann who recommends follow up as outpatient, not a candidate for aggressive therapy even if this is malignant in etiology. -currently stable  HTN:  - Will continue norvasc, metoprolol  -remains stable  Depression: - Continue SSRI and wellbutrin - stable at present    Medical Consultants:    Palliative care   Discharge Exam:   Vitals:   02/19/16 2030 02/20/16 0401  BP: (!) 145/78 (!) 125/91  Pulse: 81 83  Resp: 20 20  Temp: 98.2 F (36.8 C) 97.8 F (36.6 C)   Vitals:   02/19/16 0845 02/19/16 1335 02/19/16 2030 02/20/16 0401  BP:  135/64 (!) 145/78 (!) 125/91  Pulse: 74 63 81 83  Resp:  '16 20 20  '$ Temp:  98.4 F (36.9 C) 98.2 F (36.8 C) 97.8 F (36.6 C)  TempSrc:  Oral Oral Oral  SpO2:  91% 93% 96%  Weight:      Height:        Gen:  NAD    The results of significant diagnostics from this hospitalization (including imaging, microbiology, ancillary and laboratory) are listed below for reference.     Procedures and Diagnostic Studies:   Dg Chest 2 View  Result Date: 02/14/2016 CLINICAL DATA:  [place] 2 days ago.   Complains of thoracic spine pain. EXAM: CHEST  2 VIEW COMPARISON:  08/05/2015 FINDINGS: There are poorly defined densities along the lateral aspect of the left upper lung which are new. No evidence for a pneumothorax. There is concern for possible fractures involving the left fifth and sixth ribs. Heart and mediastinum are within normal limits. Atherosclerotic calcifications at the aortic arch. The right lung is clear. The trachea is midline without midline shift. Evidence for old compression fractures near the thoracolumbar junction. No large pleural effusions. IMPRESSION: Vague densities in the left upper lung. These densities could represent airspace disease. In addition, there is concern for left rib fractures and the left lung densities could represent a pulmonary contusion. Negative for pneumothorax. Aortic atherosclerosis. Electronically Signed   By: Markus Daft M.D.   On: 02/14/2016 13:27   Dg Thoracic Spine 2 View  Result Date: 02/14/2016 CLINICAL DATA:  Back pain after fall 2 days ago EXAM: THORACIC SPINE 2 VIEWS COMPARISON:  Lateral CXR 08/05/2015, lumbar spine radiographs from 06/23/2015 FINDINGS: Chronic moderate T12 and L1 compression fractures appear stable relative to 08/05/2015. Multilevel degenerative disc disease of the thoracic spine. Thoracic aortic atherosclerosis with mild uncoiling of the aorta is noted but without aneurysm. IMPRESSION: Chronic moderate  T12 and L1 compression fractures. No acute osseous abnormality. Thoracic spondylosis. Electronically Signed   By: Ashley Royalty M.D.   On: 02/14/2016 22:21   Dg Lumbar Spine Complete  Result Date: 02/14/2016 CLINICAL DATA:  Fall 2 days ago with low back pain, initial encounter EXAM: LUMBAR SPINE - COMPLETE 4+ VIEW COMPARISON:  06/23/2015 FINDINGS: Five lumbar type vertebral bodies are well visualized. Vertebral body height is well maintained with the exception of compression deformities at T12 and L1. Both of these are chronic in nature  as previously diagnosed in June of 2017. Osteophytic changes are noted as well as disc space narrowing stable in appearance from the prior exam. Diffuse aortic calcifications are seen. There is suggestion of bilateral renal calculi. IMPRESSION: Compression deformities of T12 and L1 of a chronic nature. No acute abnormality is noted at this time. Likely bilateral renal calculi. Electronically Signed   By: Inez Catalina M.D.   On: 02/14/2016 16:42   Ct Head Wo Contrast  Result Date: 02/14/2016 CLINICAL DATA:  Fall 2 days ago, dementia EXAM: CT HEAD WITHOUT CONTRAST CT CERVICAL SPINE WITHOUT CONTRAST TECHNIQUE: Multidetector CT imaging of the head and cervical spine was performed following the standard protocol without intravenous contrast. Multiplanar CT image reconstructions of the cervical spine were also generated. COMPARISON:  CT head dated 08/05/2015 FINDINGS: CT HEAD FINDINGS Brain: No evidence of acute infarction, hemorrhage, hydrocephalus, extra-axial collection or mass lesion/mass effect. Extensive small vessel ischemic changes. Global cortical and central atrophy. Secondary ventricular prominence. Vascular: Intracranial atherosclerosis. Skull: Normal. Negative for fracture or focal lesion. Sinuses/Orbits: Minimal partial opacification of the bilateral maxillary sinuses. Visualized paranasal sinuses and mastoid air cells are otherwise clear. Other: None. CT CERVICAL SPINE FINDINGS Alignment: Normal cervical lordosis. Skull base and vertebrae: No acute fracture. No primary bone lesion or focal pathologic process. Soft tissues and spinal canal: No prevertebral fluid or swelling. No visible canal hematoma. Disc levels:  Mild degenerative changes at L4-5. Spinal canal is patent. Upper chest: Visualized lung apices are clear. Other: Visualized thyroid is unremarkable.  Vascular calcifications. IMPRESSION: No evidence of acute intracranial abnormality. Atrophy with extensive small vessel ischemic changes. No  evidence of traumatic injury to the cervical spine. Electronically Signed   By: Julian Hy M.D.   On: 02/14/2016 12:23   Ct Chest Wo Contrast  Result Date: 02/14/2016 CLINICAL DATA:  Recent fall with chest pain, initial encounter EXAM: CT CHEST WITHOUT CONTRAST TECHNIQUE: Multidetector CT imaging of the chest was performed following the standard protocol without IV contrast. COMPARISON:  Plain film from earlier in the same day FINDINGS: Cardiovascular: Somewhat limited due the lack of IV contrast. Diffuse aortic calcifications are seen without aneurysmal dilatation. Heavy coronary calcifications are seen. Mitral annular calcifications are noted as well. Mediastinum/Nodes: No significant hilar or mediastinal adenopathy is noted. The thoracic inlet is within normal limits. Lungs/Pleura: Mild right lower lobe atelectatic changes are seen. Similar but lesser changes are noted in the left lower lobe. There are changes in left hilum consistent with prior partial lobectomy Upper Abdomen: Multiple gallstones are noted within a well distended gallbladder. A few small calcifications are noted in the region of the head of the pancreas likely related to distal common bile duct stones. No obstructive changes are seen. Stable right renal cyst is noted Musculoskeletal: Postsurgical changes are noted in the posterior left chest wall. This accounts for the changes seen on recent chest x-ray. No acute rib fracture is seen. No compression deformity is chronic appearing compression  deformity of T12 is noted. Superior Schmorl's node is noted. These changes are stable from the prior exam. The previously seen acute L1 fracture is not well appreciated on this exam. IMPRESSION: Bilateral lower lobe atelectasis worse on the right than the left. Changes consistent with prior left lobectomy are seen. Cholelithiasis with evidence of choledocholithiasis. No obstructive changes are noted. Chronic changes at T12 and postsurgical  changes in the left chest wall. No acute bony abnormality is noted. Electronically Signed   By: Inez Catalina M.D.   On: 02/14/2016 15:17   Ct Cervical Spine Wo Contrast  Result Date: 02/14/2016 CLINICAL DATA:  Fall 2 days ago, dementia EXAM: CT HEAD WITHOUT CONTRAST CT CERVICAL SPINE WITHOUT CONTRAST TECHNIQUE: Multidetector CT imaging of the head and cervical spine was performed following the standard protocol without intravenous contrast. Multiplanar CT image reconstructions of the cervical spine were also generated. COMPARISON:  CT head dated 08/05/2015 FINDINGS: CT HEAD FINDINGS Brain: No evidence of acute infarction, hemorrhage, hydrocephalus, extra-axial collection or mass lesion/mass effect. Extensive small vessel ischemic changes. Global cortical and central atrophy. Secondary ventricular prominence. Vascular: Intracranial atherosclerosis. Skull: Normal. Negative for fracture or focal lesion. Sinuses/Orbits: Minimal partial opacification of the bilateral maxillary sinuses. Visualized paranasal sinuses and mastoid air cells are otherwise clear. Other: None. CT CERVICAL SPINE FINDINGS Alignment: Normal cervical lordosis. Skull base and vertebrae: No acute fracture. No primary bone lesion or focal pathologic process. Soft tissues and spinal canal: No prevertebral fluid or swelling. No visible canal hematoma. Disc levels:  Mild degenerative changes at L4-5. Spinal canal is patent. Upper chest: Visualized lung apices are clear. Other: Visualized thyroid is unremarkable.  Vascular calcifications. IMPRESSION: No evidence of acute intracranial abnormality. Atrophy with extensive small vessel ischemic changes. No evidence of traumatic injury to the cervical spine. Electronically Signed   By: Julian Hy M.D.   On: 02/14/2016 12:23   Ct T-spine No Charge  Result Date: 02/14/2016 CLINICAL DATA:  Evaluate for compression fracture. EXAM: CT THORACIC SPINE WITHOUT CONTRAST TECHNIQUE: Multidetector CT  images of the thoracic were obtained using the standard protocol without intravenous contrast. COMPARISON:  None. FINDINGS: Alignment: Exaggerated thoracic kyphosis. Vertebrae: There is a mild compression deformity of T12 vertebral body, with associated sclerotic discogenic changes of its superior endplate. There is a 2.1 cm lytic lesion within the posterior aspect of the vertebral body which disrupts the superior endplate. Thin sclerotic margin is noted. There are mild osteoarthritic changes at T11-T12 with disc osteophyte complex formation. Paraspinal and other soft tissues: No abnormal perispinal soft tissue thickening. Disc levels: Multilevel mild osteoarthritic changes. IMPRESSION: Lytic lesion within T12 vertebral body, which extends the superior endplate of the vertebral body, with chronic appearance. Differential diagnosis includes a large Schmorl's node, chronic osteomyelitis or lytic metastatic disease. Mild compression deformity of T12 vertebral body. No perivertebral soft tissue component seen. Electronically Signed   By: Fidela Salisbury M.D.   On: 02/14/2016 18:07     Labs:   Basic Metabolic Panel:  Recent Labs Lab 02/15/16 0452 02/16/16 0442 02/18/16 0455 02/19/16 0410 02/20/16 0402  NA 140 139 141 142 140  K 3.8 3.9 3.9 4.2 3.6  CL 104 105 108 109 109  CO2 '27 25 24 25 25  '$ GLUCOSE 115* 115* 123* 110* 99  BUN 23* 21* 26* 26* 24*  CREATININE 1.22 1.16 1.23 1.13 1.08  CALCIUM 9.5 9.3 9.5 9.1 9.2  MG 1.9  --   --   --   --  GFR Estimated Creatinine Clearance: 58.6 mL/min (by C-G formula based on SCr of 1.08 mg/dL). Liver Function Tests:  Recent Labs Lab 02/14/16 1248  AST 17  ALT 12*  ALKPHOS 47  BILITOT 1.0  PROT 6.3*  ALBUMIN 3.6   No results for input(s): LIPASE, AMYLASE in the last 168 hours. No results for input(s): AMMONIA in the last 168 hours. Coagulation profile No results for input(s): INR, PROTIME in the last 168 hours.  CBC:  Recent Labs Lab  02/14/16 1248  02/16/16 0442 02/17/16 0611 02/18/16 0455 02/19/16 0410 02/20/16 0402  WBC 12.2*  < > 13.7* 18.3* 17.3* 13.8* 13.4*  NEUTROABS 9.6*  --   --   --   --   --   --   HGB 14.1  < > 14.7 14.7 14.6 13.8 13.6  HCT 43.5  < > 45.7 44.9 45.1 43.3 41.3  MCV 91.0  < > 91.0 90.5 90.7 91.9 91.0  PLT 167  < > 162 188 180 180 210  < > = values in this interval not displayed. Cardiac Enzymes: No results for input(s): CKTOTAL, CKMB, CKMBINDEX, TROPONINI in the last 168 hours. BNP: Invalid input(s): POCBNP CBG:  Recent Labs Lab 02/16/16 1708  GLUCAP 141*   D-Dimer No results for input(s): DDIMER in the last 72 hours. Hgb A1c No results for input(s): HGBA1C in the last 72 hours. Lipid Profile No results for input(s): CHOL, HDL, LDLCALC, TRIG, CHOLHDL, LDLDIRECT in the last 72 hours. Thyroid function studies No results for input(s): TSH, T4TOTAL, T3FREE, THYROIDAB in the last 72 hours.  Invalid input(s): FREET3 Anemia work up No results for input(s): VITAMINB12, FOLATE, FERRITIN, TIBC, IRON, RETICCTPCT in the last 72 hours. Microbiology Recent Results (from the past 240 hour(s))  Urine culture     Status: Abnormal   Collection Time: 02/14/16 12:53 PM  Result Value Ref Range Status   Specimen Description URINE, RANDOM  Final   Special Requests NONE  Final   Culture MULTIPLE SPECIES PRESENT, SUGGEST RECOLLECTION (A)  Final   Report Status 02/16/2016 FINAL  Final  Blood culture (routine x 2)     Status: None   Collection Time: 02/14/16  3:16 PM  Result Value Ref Range Status   Specimen Description BLOOD LEFT ANTECUBITAL  Final   Special Requests BOTTLES DRAWN AEROBIC AND ANAEROBIC 5 CC EACH  Final   Culture   Final    NO GROWTH 5 DAYS Performed at [facility]    Report Status 02/19/2016 FINAL  Final  Blood culture (routine x 2)     Status: None   Collection Time: 02/14/16  3:35 PM  Result Value Ref Range Status   Specimen Description BLOOD LEFT HAND  Final    Special Requests BOTTLES DRAWN AEROBIC ONLY 5CC  Final   Culture   Final    NO GROWTH 5 DAYS Performed at [facility]    Report Status 02/19/2016 FINAL  Final  Culture, Urine     Status: None   Collection Time: 02/16/16  5:00 PM  Result Value Ref Range Status   Specimen Description URINE, CLEAN CATCH  Final   Special Requests NONE  Final   Culture NO GROWTH Performed at [facility]   Final   Report Status 02/18/2016 FINAL  Final     Discharge Instructions:   Discharge Instructions    Discharge instructions    Complete by:  As directed    DYS 2 diet DNR Palliative to follow  at SNF   Increase activity slowly    Complete by:  As directed      Allergies as of 02/20/2016      Reactions   Sulfa Antibiotics Nausea And Vomiting   Sulfonamide Derivatives Nausea And Vomiting      Medication List    TAKE these medications   acetaminophen 325 MG tablet Commonly known as:  TYLENOL Take 2 tablets (650 mg total) by mouth every 6 (six) hours.   Alpha Lipoic Acid 200 MG Caps Take 200 mg by mouth daily.   amLODipine 5 MG tablet Commonly known as:  NORVASC Take 1 tablet (5 mg total) by mouth daily.   ascorbic acid 1000 MG tablet Commonly known as:  VITAMIN C Take 1,000 mg by mouth daily.   aspirin EC 81 MG tablet Take 81 mg by mouth daily.   atorvastatin 10 MG tablet Commonly known as:  LIPITOR Take 10 mg by mouth at bedtime.   buPROPion 75 MG tablet Commonly known as:  WELLBUTRIN Take 75 mg by mouth daily.   CALCIUM 600 PO Take 1,000 mg by mouth daily.   cholecalciferol 1000 units tablet Commonly known as:  VITAMIN D Take 1,000 Units by mouth daily.   esomeprazole 40 MG capsule Commonly known as:  NEXIUM Take 40 mg by mouth daily as needed (for acid reflux).   feeding supplement (ENSURE ENLIVE) Liqd Take 237 mLs by mouth daily.   HYDROcodone-acetaminophen 5-325 MG tablet Commonly known as:  NORCO/VICODIN Take 1 tablet by mouth every 8  (eight) hours.   hydrocortisone valerate cream 0.2 % Commonly known as:  WESTCORT Apply 1 application topically 2 (two) times daily as needed (for itching/irritation).   lidocaine 5 % Commonly known as:  LIDODERM Place 1 patch onto the skin daily. Remove & Discard patch within 12 hours or as directed by MD Start taking on:  02/21/2016   Melatonin 5 MG Tabs Take 5 mg by mouth at bedtime.   metoprolol succinate 25 MG 24 hr tablet Commonly known as:  TOPROL-XL Take 25 mg by mouth daily.   morphine CONCENTRATE 10 MG/0.5ML Soln concentrated solution Take 0.13 mLs (2.6 mg total) by mouth every hour as needed for moderate pain or shortness of breath.   PROBIOTIC PO Take 460 mg by mouth daily.   sertraline 100 MG tablet Commonly known as:  ZOLOFT Take 100 mg by mouth daily.   vitamin B-12 1000 MCG tablet Commonly known as:  CYANOCOBALAMIN Take 1,000 mcg by mouth daily.   zolpidem 5 MG tablet Commonly known as:  AMBIEN Take 5 mg by mouth at bedtime.      Follow-up Information    [name], MD Follow up in 1 week(s).   Specialty:  Internal Medicine Contact information: [address] [place] [address] [phone]            Time coordinating discharge: 35 min  Signed:  Geradine Girt   [facility] 02/20/2016, 12:35 PM

## 2016-02-20 NOTE — Care Management Note (Signed)
Case Management Note  Patient Details  Name: [name] MRN: [id number] Date of Birth: 29-May-1930  Subjective/Objective:      UTI               Action/Plan: Discharge Planning: AVS reviewed:  Chart  Reviewed. Scheduled dc to SNF. CSW following for SNF placement.   PCP [name] MD  Expected Discharge Date:  02/20/2016               Expected Discharge Plan:  Skilled Nursing Facility  In-House Referral:  Clinical Social Work  Discharge planning Services  CM Consult  Post Acute Care Choice:  NA Choice offered to:  NA  DME Arranged:  N/A DME Agency:  NA  HH Arranged:  NA HH Agency:  NA  Status of Service:  Completed, signed off  If discussed at [place] of Stay Meetings, dates discussed:    Additional Comments:  Erenest Rasher, RN 02/20/2016, 2:55 PM

## 2016-02-22 DIAGNOSIS — Z8551 Personal history of malignant neoplasm of bladder: Secondary | ICD-10-CM | POA: Diagnosis not present

## 2016-02-22 DIAGNOSIS — I4891 Unspecified atrial fibrillation: Secondary | ICD-10-CM | POA: Diagnosis not present

## 2016-02-22 DIAGNOSIS — F0390 Unspecified dementia without behavioral disturbance: Secondary | ICD-10-CM | POA: Diagnosis not present

## 2016-02-22 DIAGNOSIS — N390 Urinary tract infection, site not specified: Secondary | ICD-10-CM | POA: Diagnosis not present

## 2016-02-22 DIAGNOSIS — Z85118 Personal history of other malignant neoplasm of bronchus and lung: Secondary | ICD-10-CM | POA: Diagnosis not present

## 2016-02-22 DIAGNOSIS — W19XXXA Unspecified fall, initial encounter: Secondary | ICD-10-CM | POA: Diagnosis not present

## 2016-02-22 DIAGNOSIS — M4854XA Collapsed vertebra, not elsewhere classified, thoracic region, initial encounter for fracture: Secondary | ICD-10-CM | POA: Diagnosis not present

## 2016-02-26 DIAGNOSIS — R531 Weakness: Secondary | ICD-10-CM | POA: Diagnosis not present

## 2016-03-07 DIAGNOSIS — R627 Adult failure to thrive: Secondary | ICD-10-CM | POA: Diagnosis not present

## 2016-03-11 DIAGNOSIS — F329 Major depressive disorder, single episode, unspecified: Secondary | ICD-10-CM | POA: Diagnosis not present

## 2016-03-11 DIAGNOSIS — R339 Retention of urine, unspecified: Secondary | ICD-10-CM | POA: Diagnosis not present

## 2016-03-11 DIAGNOSIS — M4854XA Collapsed vertebra, not elsewhere classified, thoracic region, initial encounter for fracture: Secondary | ICD-10-CM | POA: Diagnosis not present

## 2016-03-11 DIAGNOSIS — I4891 Unspecified atrial fibrillation: Secondary | ICD-10-CM | POA: Diagnosis not present

## 2016-03-11 DIAGNOSIS — N390 Urinary tract infection, site not specified: Secondary | ICD-10-CM | POA: Diagnosis not present

## 2016-03-24 DEATH — deceased

## 2017-04-16 IMAGING — CT CT CERVICAL SPINE W/O CM
3 of 8 series · 10 of 33 positions shown, 12 images · non-contrast
Comparison: 04/08/2014, 05/21/2014, prior MRI 04/08/2014

CLINICAL DATA: 83-year-old male with a history of fall.

EXAM:
CT HEAD WITHOUT CONTRAST
CT CERVICAL SPINE WITHOUT CONTRAST
TECHNIQUE: Multidetector CT imaging of the head and cervical spine was
performed following the standard protocol without intravenous
contrast. Multiplanar CT image reconstructions of the cervical spine
were also generated.

[Series 5: c_spine 2.0 i30s 3 · axial · 0.33mm/px · z∈[-244,-176]mm · 2 of 104 slices shown, 3 images]
[im 35/104  soft-tissue]
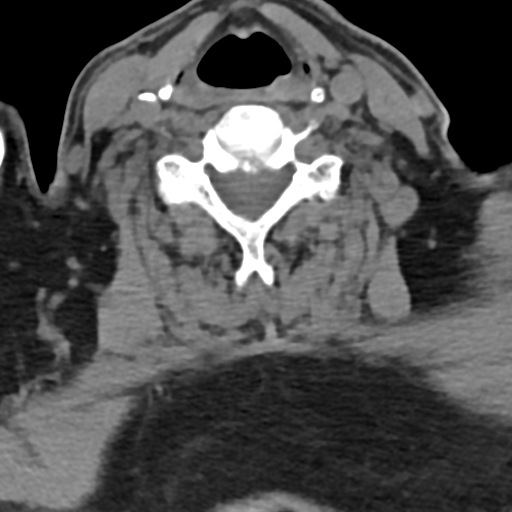
[im 35/104  bone]
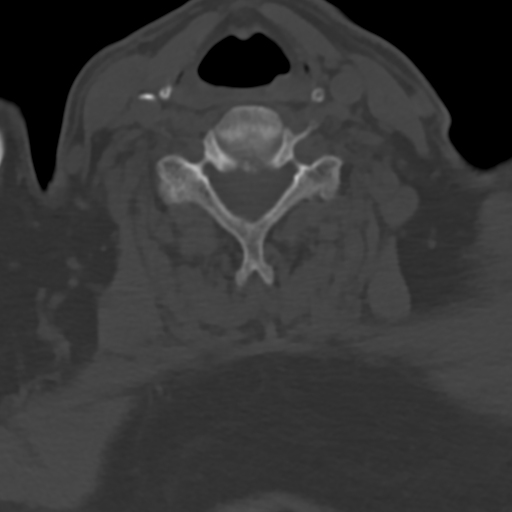
[im 69/104  bone]
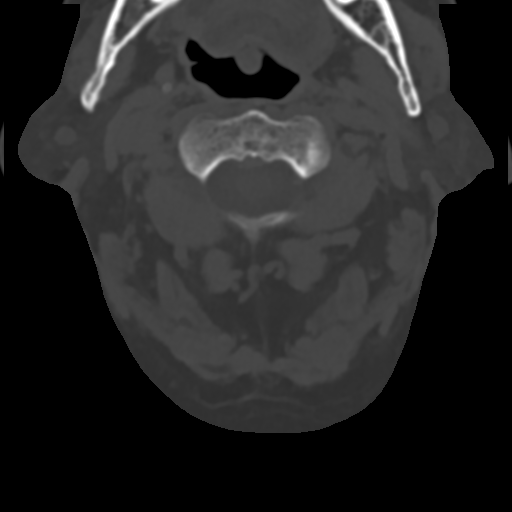

[Series 8: sagittals · sagittal · 0.33mm/px · 5 of 57 slices shown, 6 images]
[im 19/57  bone]
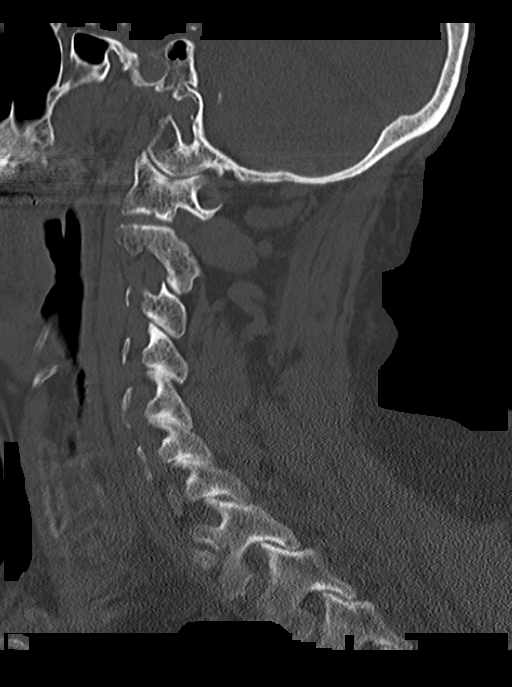
[im 24/57  bone]
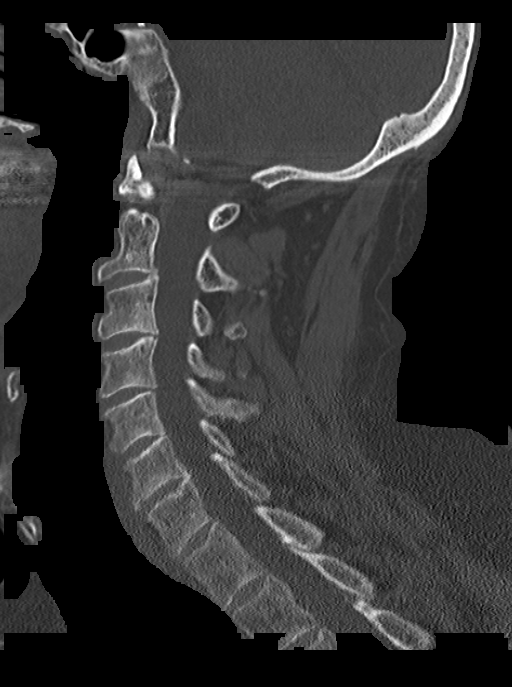
[im 29/57  soft-tissue]
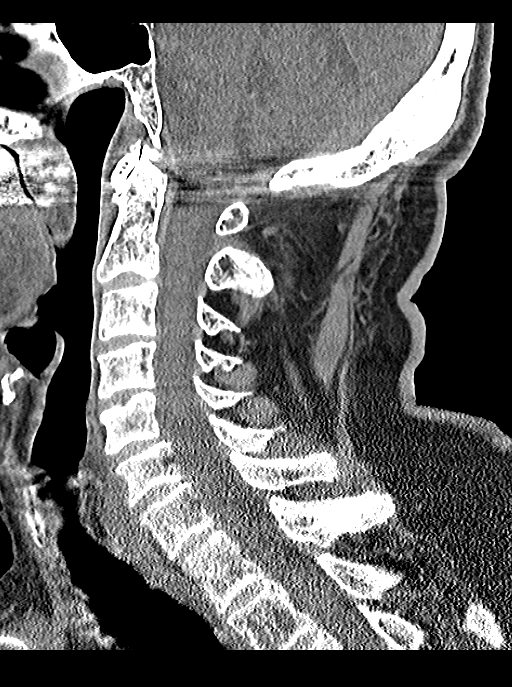
[im 29/57  bone]
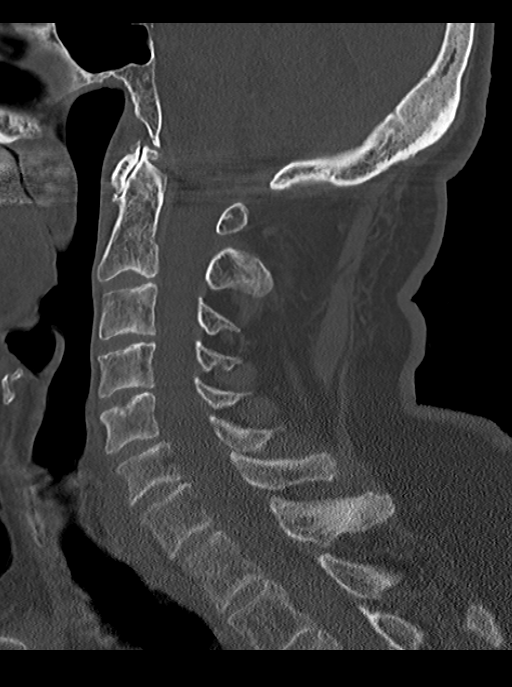
[im 33/57  bone]
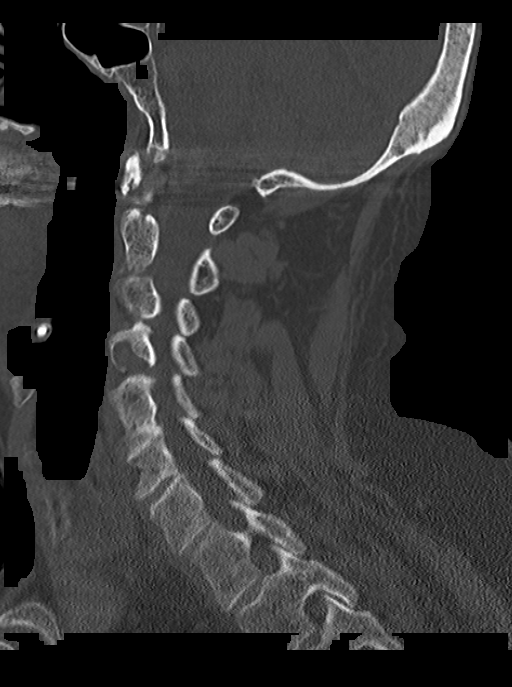
[im 38/57  bone]
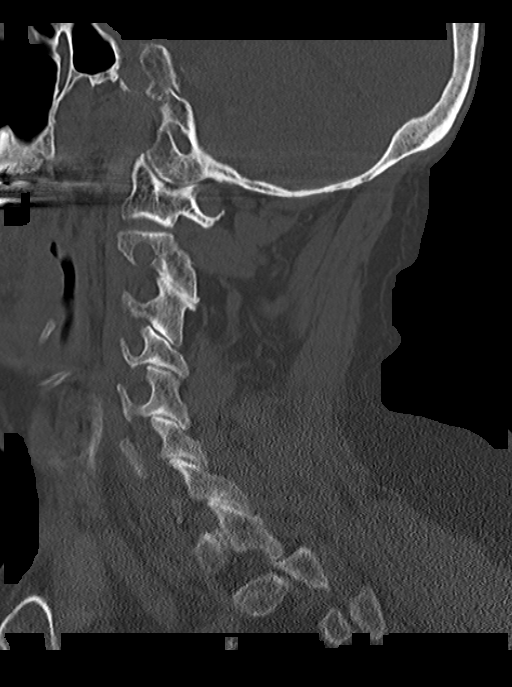

[Series 10: coronals · coronal · 0.45mm/px · 3 of 72 slices shown]
[im 10/72  bone]
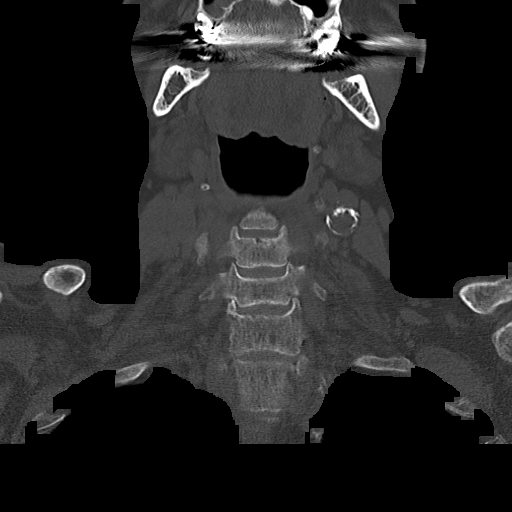
[im 36/72  bone]
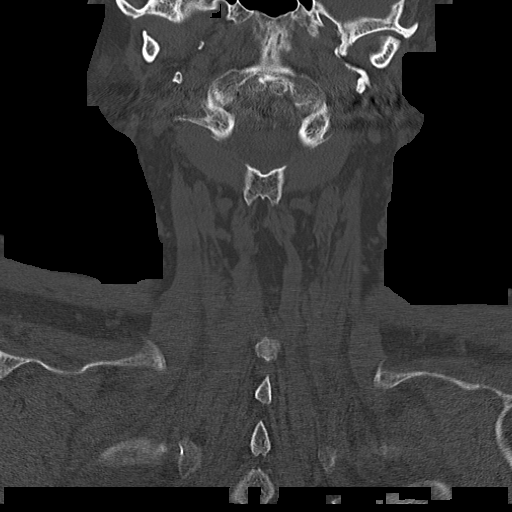
[im 63/72  bone]
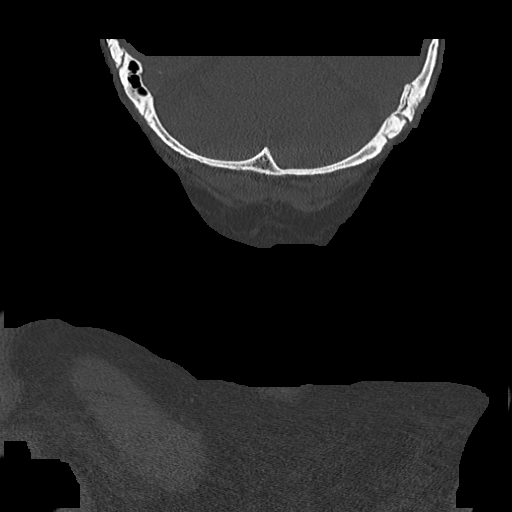

[10 of 33 positions shown; findings below may reference images not displayed]

FINDINGS: CT HEAD FINDINGS

Unremarkable appearance of the calvarium without acute fracture or
aggressive lesion.

Unremarkable appearance of the scalp soft tissues.

Unremarkable appearance of the bilateral orbits.

Mastoid air cells are clear.

Trace mucosal disease of the bilateral maxillary sinuses.

No acute intracranial hemorrhage. No midline shift or mass effect.
Configuration of the ventricular system is unchanged from the
comparison. Confluent hypodensity in the periventricular white
matter. Gray-white differentiation is relatively maintained.
Senescent brain volume loss again noted.

CT CERVICAL SPINE FINDINGS

Craniocervical junction unremarkable.  No fracture line identified.

Cervical elements maintain relative anatomic alignment. No
subluxation, anterolisthesis, retrolisthesis.

Vertebral body heights relatively maintained.

Disc spaces are narrowed with endplate changes compatible with
multilevel degenerative disc disease. No significant bony canal
narrowing.

Mild bilateral facet disease, more pronounced on the left.

Soft tissues of the cervical region unremarkable. Dense
calcifications of bilateral carotid system.

Unremarkable appearance of the lung apices.
IMPRESSION: CT head:

No CT evidence of acute intracranial abnormality.

Re- demonstration of changes of chronic microvascular ischemic
disease.

Cervical Spine:

No CT evidence of acute fracture or malalignment of the cervical
spine.

Multilevel degenerative changes of the cervical spine.

Bilateral carotid calcifications.

## 2017-04-16 IMAGING — DX DG KNEE COMPLETE 4+V*L*
4 series · 4 of 4 positions shown · non-contrast
Comparison: Right knee radiographs of the same day.

CLINICAL DATA: Multiple falls over the past 3 weeks. Bilateral knee
pain.

EXAM:
LEFT KNEE - COMPLETE 4+ VIEW

[knee ap]
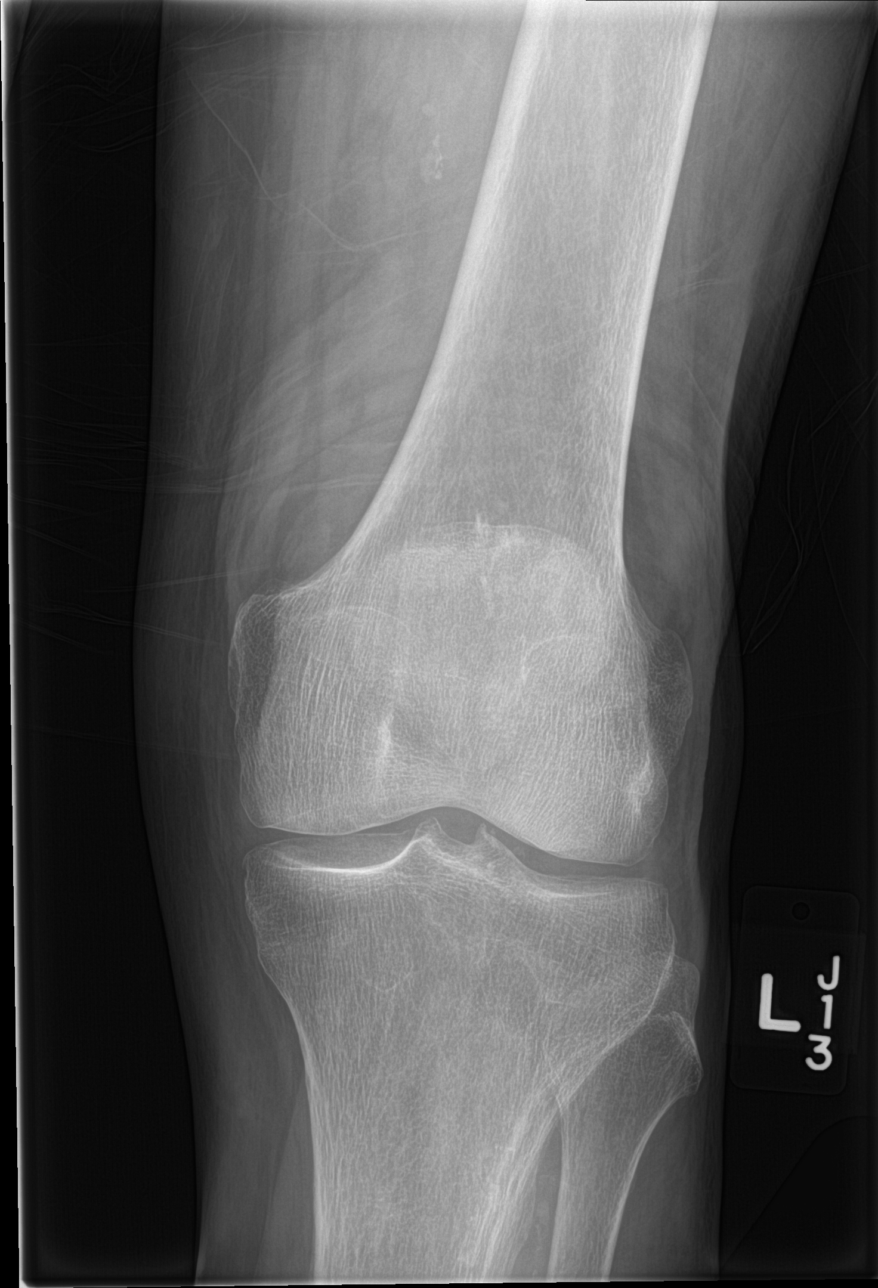

[knee lat]
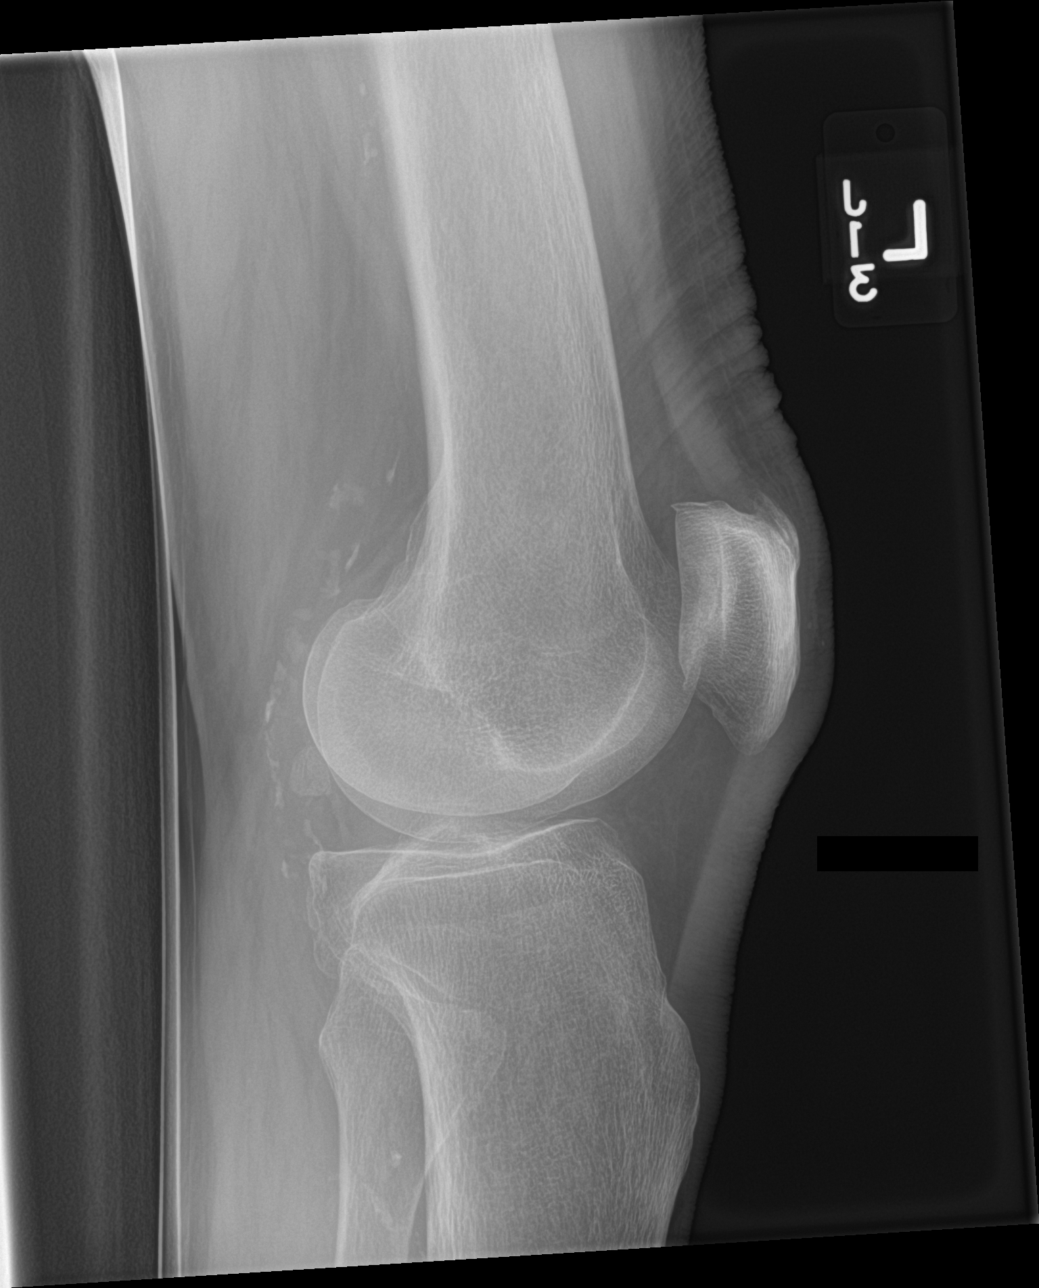

[knee obl (1 of 2)]
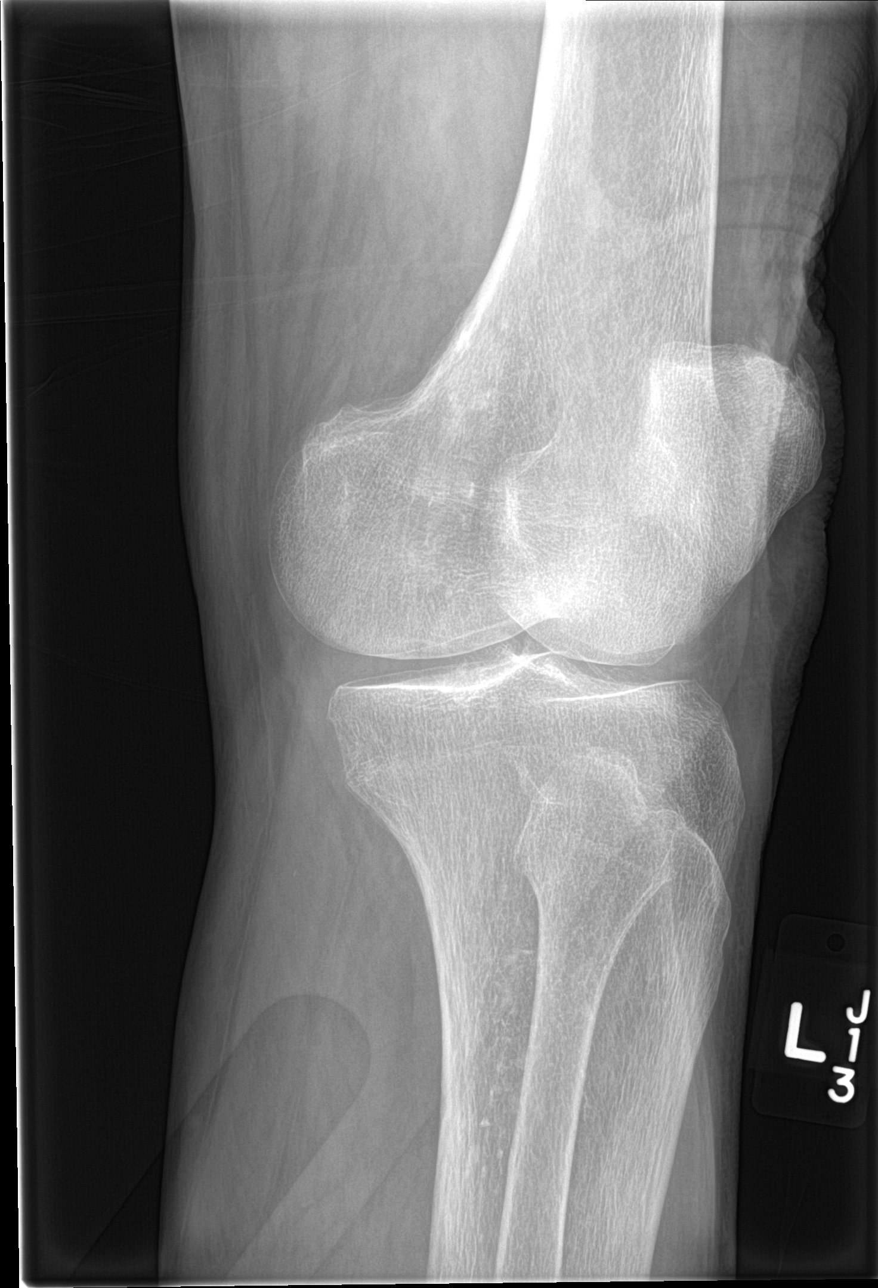

[knee obl (2 of 2)]
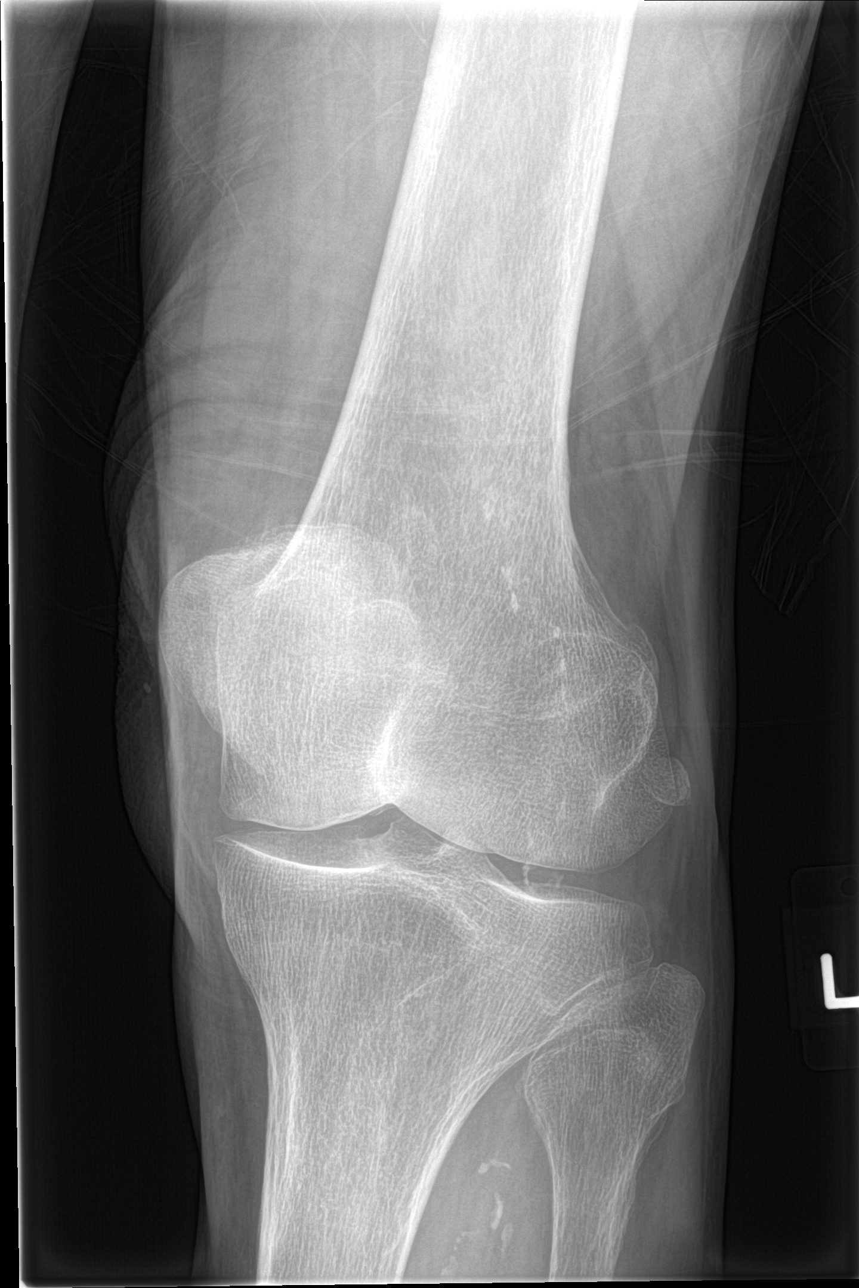

[4 of 4 positions shown; findings below may reference images not displayed]

FINDINGS: Mild degenerative changes are present in the patellofemoral
compartment. The left knee is located. No acute fracture or
subluxation is evident. There is no radiopaque foreign body.
Atherosclerotic calcifications are present in the femoral popliteal
artery.
IMPRESSION: 1. No acute abnormality or focal lesion to explain the patient's
pain.
2. Minimal degenerative changes in the patellofemoral compartment.
3. Atherosclerosis.

## 2017-04-16 IMAGING — DX DG CHEST 2V
2 series · 2 of 2 positions shown · non-contrast
Comparison: Chest radiograph dated 05/21/2014

CLINICAL DATA: 83-year-old male with shortness of breath and
multiple falls in the past 3 weeks

EXAM:
CHEST  2 VIEW

[chest lat]
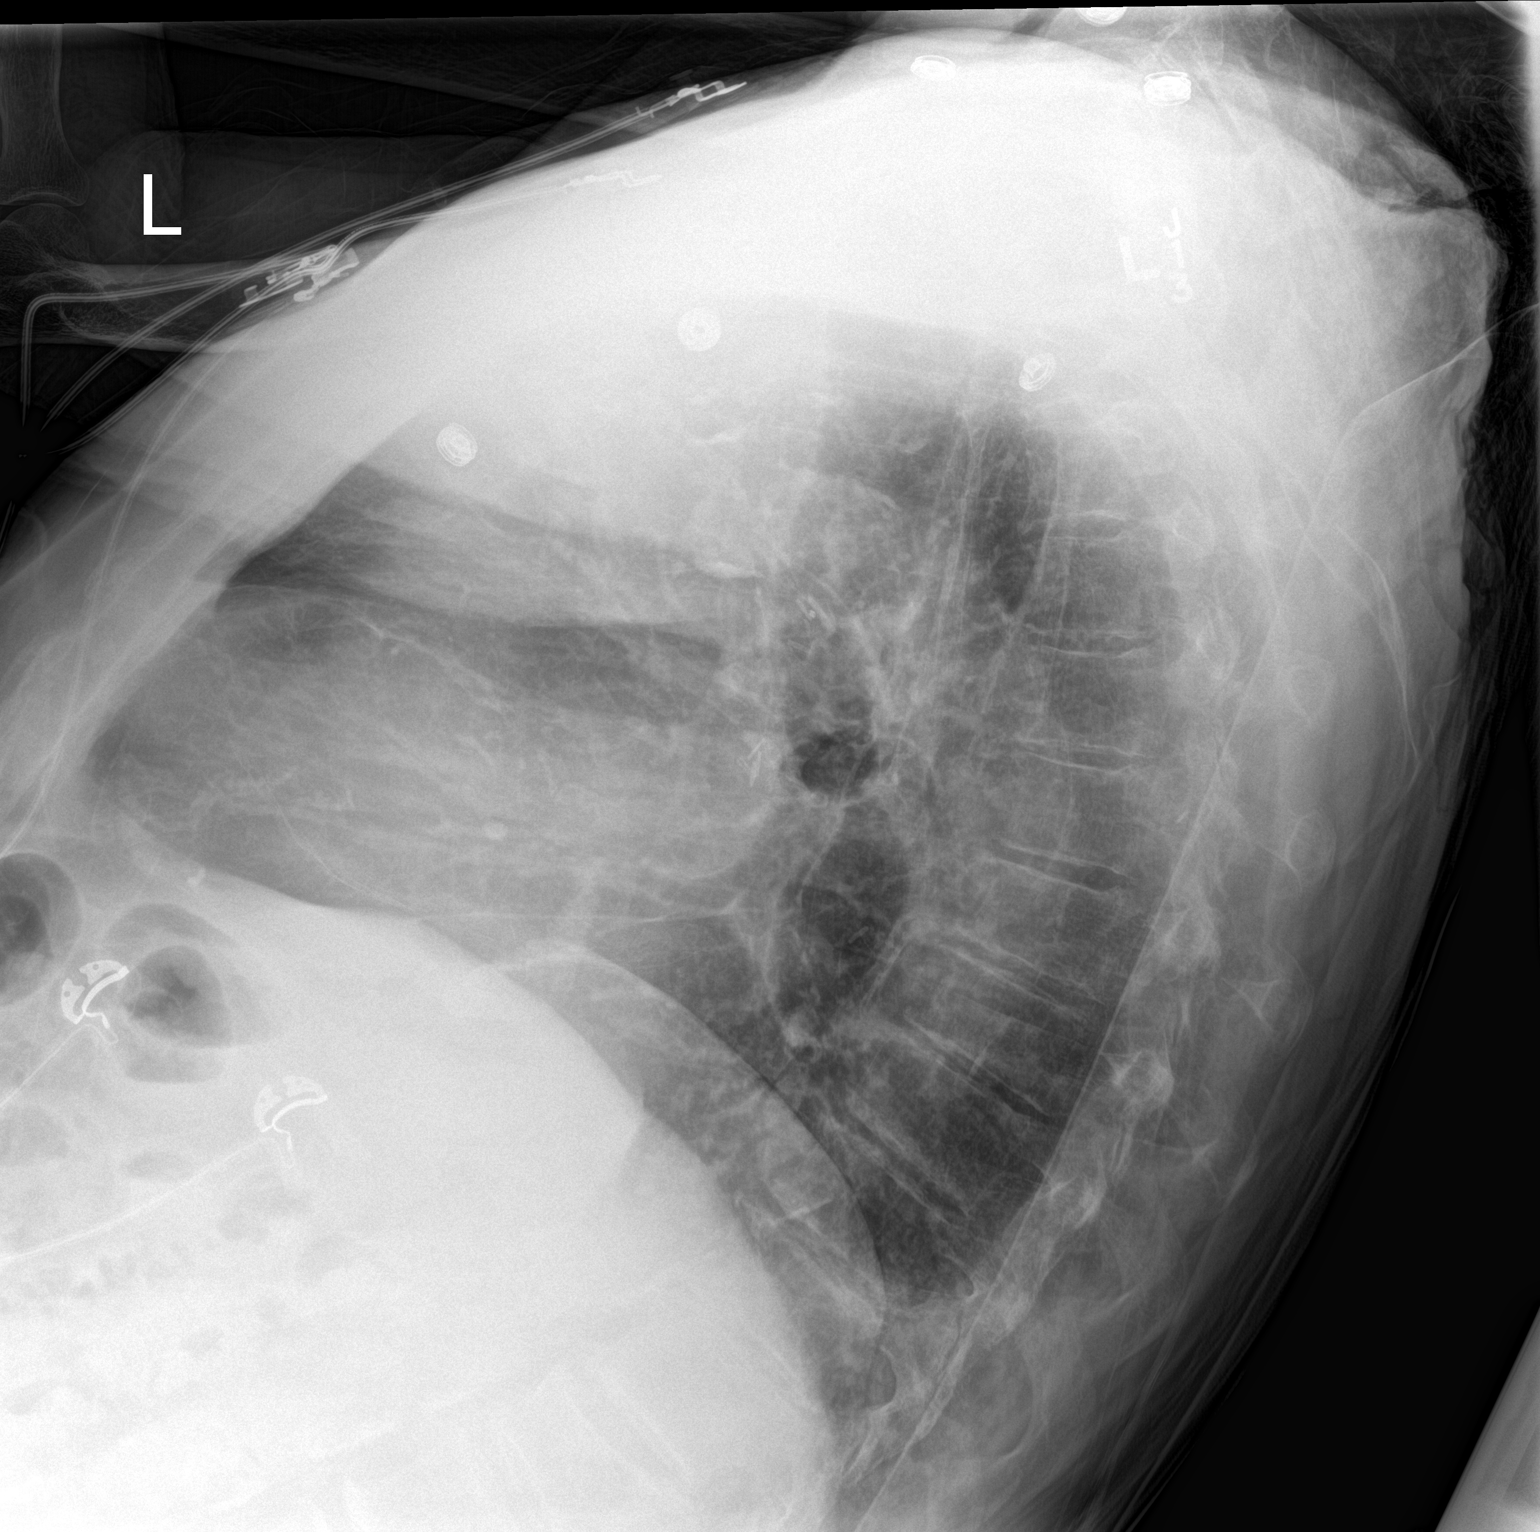

[chest ap]
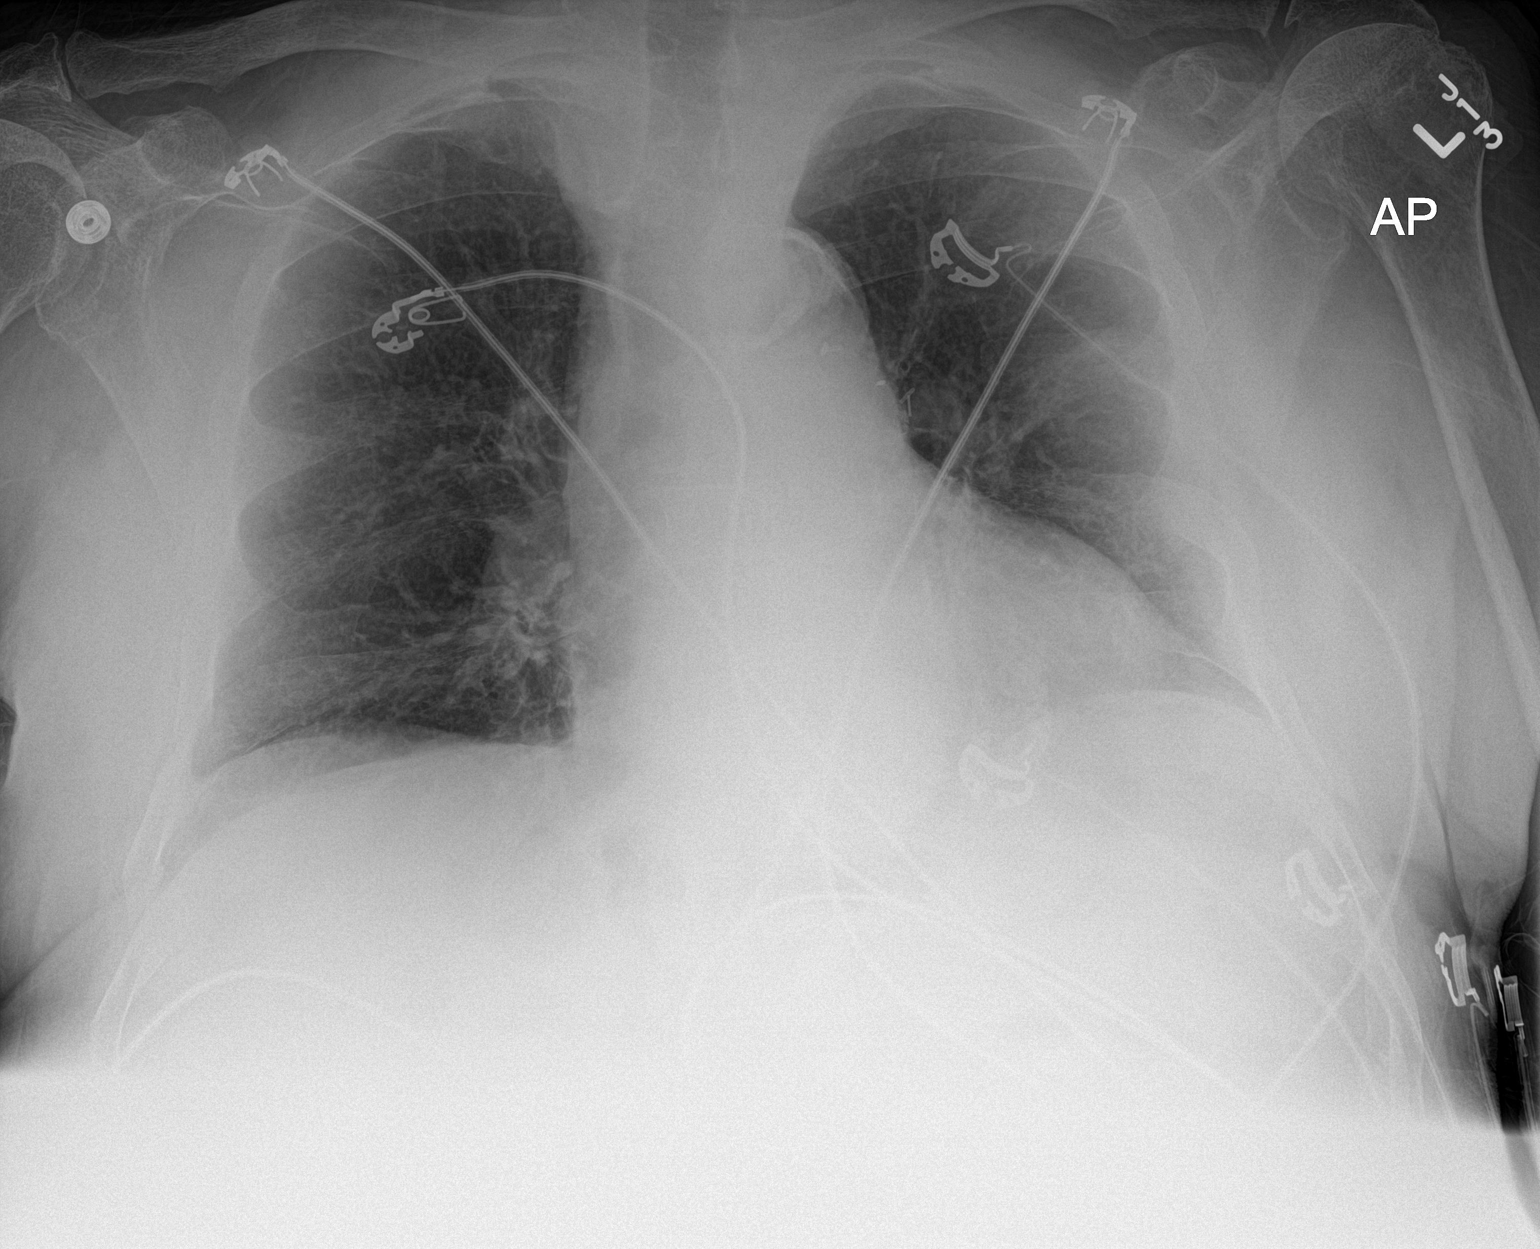

[2 of 2 positions shown; findings below may reference images not displayed]

FINDINGS: Single-view of the chest demonstrate chronic senescent changes of
the lungs. No focal consolidation, pleural effusion, or
pneumothorax. Stable top normal size cardiac silhouette.
Atherosclerotic calcification of the aortic arch. Left hilar
surgical clips noted. Degenerative changes of the osseous
structures.
IMPRESSION: No acute cardiopulmonary process.
